# Patient Record
Sex: Male | Born: 1940 | Race: White | Hispanic: No | Smoking: Current every day smoker
Health system: Southern US, Community
[De-identification: ages and names within clinical notes are randomized; demographics above are authoritative.]

## PROBLEM LIST (undated history)

## (undated) DIAGNOSIS — R011 Cardiac murmur, unspecified: Secondary | ICD-10-CM

## (undated) DIAGNOSIS — I10 Essential (primary) hypertension: Secondary | ICD-10-CM

## (undated) DIAGNOSIS — C3490 Malignant neoplasm of unspecified part of unspecified bronchus or lung: Secondary | ICD-10-CM

## (undated) DIAGNOSIS — T7840XA Allergy, unspecified, initial encounter: Secondary | ICD-10-CM

## (undated) DIAGNOSIS — K3533 Acute appendicitis with perforation and localized peritonitis, with abscess: Principal | ICD-10-CM

## (undated) DIAGNOSIS — R972 Elevated prostate specific antigen [PSA]: Secondary | ICD-10-CM

## (undated) DIAGNOSIS — I4891 Unspecified atrial fibrillation: Secondary | ICD-10-CM

## (undated) DIAGNOSIS — F17200 Nicotine dependence, unspecified, uncomplicated: Secondary | ICD-10-CM

## (undated) DIAGNOSIS — K567 Ileus, unspecified: Secondary | ICD-10-CM

## (undated) DIAGNOSIS — K9189 Other postprocedural complications and disorders of digestive system: Secondary | ICD-10-CM

## (undated) DIAGNOSIS — M1990 Unspecified osteoarthritis, unspecified site: Secondary | ICD-10-CM

## (undated) DIAGNOSIS — F419 Anxiety disorder, unspecified: Secondary | ICD-10-CM

## (undated) DIAGNOSIS — R7881 Bacteremia: Secondary | ICD-10-CM

## (undated) DIAGNOSIS — R0602 Shortness of breath: Secondary | ICD-10-CM

## (undated) HISTORY — DX: Essential (primary) hypertension: I10

## (undated) HISTORY — PX: APPENDECTOMY: SHX54

## (undated) HISTORY — PX: KNEE SURGERY: SHX244

## (undated) HISTORY — DX: Anxiety disorder, unspecified: F41.9

## (undated) HISTORY — DX: Bacteremia: R78.81

## (undated) HISTORY — PX: CARPAL TUNNEL RELEASE: SHX101

## (undated) HISTORY — DX: Cardiac murmur, unspecified: R01.1

## (undated) HISTORY — PX: POLYPECTOMY: SHX149

## (undated) HISTORY — PX: BACK SURGERY: SHX140

## (undated) HISTORY — PX: COLONOSCOPY: SHX174

## (undated) HISTORY — PX: EYE SURGERY: SHX253

## (undated) HISTORY — DX: Allergy, unspecified, initial encounter: T78.40XA

## (undated) HISTORY — DX: Elevated prostate specific antigen (PSA): R97.20

---

## 2000-09-16 ENCOUNTER — Encounter: Payer: Self-pay | Admitting: Orthopedic Surgery

## 2000-09-16 ENCOUNTER — Ambulatory Visit (HOSPITAL_COMMUNITY): Admission: RE | Admit: 2000-09-16 | Discharge: 2000-09-16 | Payer: Self-pay | Admitting: Orthopedic Surgery

## 2001-05-14 ENCOUNTER — Encounter: Payer: Self-pay | Admitting: Orthopedic Surgery

## 2001-05-14 ENCOUNTER — Encounter: Admission: RE | Admit: 2001-05-14 | Discharge: 2001-05-14 | Payer: Self-pay | Admitting: Orthopedic Surgery

## 2001-08-20 ENCOUNTER — Encounter (INDEPENDENT_AMBULATORY_CARE_PROVIDER_SITE_OTHER): Payer: Self-pay | Admitting: Specialist

## 2001-08-20 ENCOUNTER — Encounter: Payer: Self-pay | Admitting: Orthopedic Surgery

## 2001-08-20 ENCOUNTER — Observation Stay (HOSPITAL_COMMUNITY): Admission: RE | Admit: 2001-08-20 | Discharge: 2001-08-21 | Payer: Self-pay | Admitting: Orthopedic Surgery

## 2002-07-10 ENCOUNTER — Encounter: Payer: Self-pay | Admitting: Orthopedic Surgery

## 2002-07-10 ENCOUNTER — Encounter: Admission: RE | Admit: 2002-07-10 | Discharge: 2002-07-10 | Payer: Self-pay | Admitting: Orthopedic Surgery

## 2002-07-31 ENCOUNTER — Observation Stay (HOSPITAL_COMMUNITY): Admission: RE | Admit: 2002-07-31 | Discharge: 2002-08-01 | Payer: Self-pay | Admitting: Orthopedic Surgery

## 2002-07-31 ENCOUNTER — Encounter (INDEPENDENT_AMBULATORY_CARE_PROVIDER_SITE_OTHER): Payer: Self-pay | Admitting: Specialist

## 2002-07-31 ENCOUNTER — Encounter: Payer: Self-pay | Admitting: Orthopedic Surgery

## 2003-07-02 ENCOUNTER — Encounter: Admission: RE | Admit: 2003-07-02 | Discharge: 2003-07-02 | Payer: Self-pay | Admitting: Internal Medicine

## 2004-06-05 ENCOUNTER — Ambulatory Visit: Payer: Self-pay | Admitting: Internal Medicine

## 2004-08-29 ENCOUNTER — Ambulatory Visit: Payer: Self-pay | Admitting: Internal Medicine

## 2004-09-08 ENCOUNTER — Ambulatory Visit: Payer: Self-pay | Admitting: Internal Medicine

## 2004-09-25 ENCOUNTER — Ambulatory Visit: Payer: Self-pay | Admitting: Internal Medicine

## 2005-08-09 ENCOUNTER — Ambulatory Visit: Payer: Self-pay | Admitting: Internal Medicine

## 2005-08-23 ENCOUNTER — Ambulatory Visit: Payer: Self-pay | Admitting: *Deleted

## 2005-08-27 ENCOUNTER — Ambulatory Visit: Payer: Self-pay | Admitting: Emergency Medicine

## 2006-03-11 ENCOUNTER — Ambulatory Visit (HOSPITAL_BASED_OUTPATIENT_CLINIC_OR_DEPARTMENT_OTHER): Admission: RE | Admit: 2006-03-11 | Discharge: 2006-03-11 | Payer: Self-pay | Admitting: Orthopedic Surgery

## 2006-10-22 ENCOUNTER — Ambulatory Visit: Payer: Self-pay | Admitting: Internal Medicine

## 2007-03-18 ENCOUNTER — Telehealth (INDEPENDENT_AMBULATORY_CARE_PROVIDER_SITE_OTHER): Payer: Self-pay | Admitting: *Deleted

## 2007-11-06 ENCOUNTER — Telehealth (INDEPENDENT_AMBULATORY_CARE_PROVIDER_SITE_OTHER): Payer: Self-pay | Admitting: *Deleted

## 2007-11-10 ENCOUNTER — Ambulatory Visit: Payer: Self-pay | Admitting: Internal Medicine

## 2007-11-10 DIAGNOSIS — L509 Urticaria, unspecified: Secondary | ICD-10-CM | POA: Insufficient documentation

## 2007-11-10 DIAGNOSIS — Z87898 Personal history of other specified conditions: Secondary | ICD-10-CM | POA: Insufficient documentation

## 2008-01-29 ENCOUNTER — Ambulatory Visit: Payer: Self-pay | Admitting: Internal Medicine

## 2008-01-29 DIAGNOSIS — H9209 Otalgia, unspecified ear: Secondary | ICD-10-CM | POA: Insufficient documentation

## 2008-01-29 DIAGNOSIS — H6120 Impacted cerumen, unspecified ear: Secondary | ICD-10-CM | POA: Insufficient documentation

## 2008-02-02 ENCOUNTER — Telehealth (INDEPENDENT_AMBULATORY_CARE_PROVIDER_SITE_OTHER): Payer: Self-pay | Admitting: *Deleted

## 2008-02-04 ENCOUNTER — Encounter: Payer: Self-pay | Admitting: Internal Medicine

## 2010-12-01 NOTE — Op Note (Signed)
NAMETRENDEN, HAZELRIGG                         ACCOUNT NO.:  [id number]   MEDICAL RECORD NO.:  [id number]                   PATIENT TYPE:  AMB   LOCATION:  DAY                                  FACILITY:  [facility]   PHYSICIAN:  Marlowe Kays, M.D.               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   SURGEON:  Marlowe Kays, M.D.   ASSISTANT:  Sharolyn Douglas, M.D.   ANESTHESIA:  General.   PATHOLOGY AND JUSTIFICATION FOR PROCEDURE:  He had had a microdiskectomy and  lateral recess decompression roughly a year ago by me on the left at L4-5  and had done well until several months ago, when he developed severe pain in  the left buttock and left leg going down to the lateral calf.  Workup has  included a gadolinium-enhanced MRI, which has demonstrated what appeared to  be foraminal stenosis and possible disk herniation at L5-S1 left as well as  associated with osteophytes and perhaps a similar-type picture on the left  at L4-5 without any recurrent disk herniation.  Myelogram and CT scan have  indicated poor filling of the S1 nerve root on the left and lateral recess  defects at both L4-5 and L5-S1 with a possible free fragment superior to the  L5-S1 disk, which is felt to probably emanate from the L5-S1 disk.  Accordingly, planned today was to do a microdiskectomy and decompression of  the S1 nerve root and probably decompress laterally as well up to L4-5,  decompressing the L5 nerve root and looking for the free fragment.   DESCRIPTION OF PROCEDURE:  Prophylactic antibiotics.  Satisfactory general  anesthesia, in knee-chest position on the Sharon frame.  The back was  prepped with Duraprep and with two spinal needles and a lateral x-ray  tentatively localized the L5-S1 interspace, which was just distal to the  previous incision.  Then continued draping the back in a sterile field.  Made my incision based on the initial x-rays and locating anatomically the  sacrum and the untouched L5-S1 interspace.  Soft tissue was dissected off  the lamina of L5 and the sacrum.  Self-retaining retractor placed.  We then  removed a portion of the inferior lamina of L5 with a double-action rongeur  and then behind the sacrum with a small curette and then began using two 3  mm Kerrison rongeurs to remove bone and decompress the foramen.  There  was a  good bit of bony compression as pictured on the MRI and on the myelogram.  We then brought in the microscope and completed the foraminal and lateral  recess decompression.  The L5-S1 disk was located and found to be somewhat  bulging with a discoloration of the annulus.  We opened this with a 15 knife  blade, but the disk space was narrow and we really could not get a lot of  disk material out.  This confirmed that we felt that there was additional  pathology as we had planned, and we decided to begin looking cephalad and  removing almost all of the lamina of L5 with a combination of double action  and Kerrison rongeurs, in the process did a lateral recess decompression and  unroofed the L5 nerve root.  It was being tightly compressed laterally past  the foramen.  We also found a good bit of material which may have been disk  material or ligamentum flavum.  We were unable to tell for certain between  the L5 and S1 nerve roots.  All this was cleaned up with pituitary and  Kerrison rongeur.  At conclusion of the  case, both the L5 and S1 nerve roots  were lying nicely free, and there was no lateral recess compression.  The  wound was then irrigated with sterile saline.  It was dry on closure.  Gelfoam was placed over the dura.  The self-retaining retractors were  removed, and once again there was no unusual bleeding.  We closed the fascia  with interrupted #1 Vicryl, the subcutaneous tissue with 2-0 Vicryl after  infiltrating it with 0.5% plain Marcaine.  He was also given 30 mg of  Toradol IV.  The skin was closed with staples.  Betadine, Adaptic, dry  sterile dressing were applied.  He tolerated the procedure well and was  taken to the recovery room in satisfactory condition with no known  complications.                                               Marlowe Kays, M.D.    JA/MEDQ  D:  07/31/2002  T:  08/01/2002  Job:  [id number]

## 2010-12-01 NOTE — Op Note (Signed)
NAMETONY, GRANQUIST               ACCOUNT NO.:  [id number]   MEDICAL RECORD NO.:  [id number]          PATIENT TYPE:  AMB   LOCATION:  [facility]                         FACILITY:  [facility]   PHYSICIAN:  Marlowe Kays, M.D.  DATE OF BIRTH:  [birth year]   DATE OF PROCEDURE:  03/11/2006  DATE OF DISCHARGE:                                 OPERATIVE REPORT   PREOPERATIVE DIAGNOSES:  1. Torn medial meniscus.  2. Osteoarthritis right knee.   POSTOPERATIVE DIAGNOSES:  1. Torn medial meniscus.  2. Osteoarthritis right knee.  3. Torn lateral meniscus.   OPERATION:  Right knee arthroscopy with partial medial and lateral  meniscectomies and joint debridement.   SURGEON:  Marlowe Kays, M.D.   ASSISTANT:  Nurse.   ANESTHESIA:  General.   PATHOLOGY AND JUSTIFICATION FOR PROCEDURE:  He has had right knee problems  dating since high school. Had knee surgery at that time, had done reasonably  well until about 8 weeks ago when he developed pain in his knee with an MRI  demonstrating ACL deficient knee, osteoarthritis, some loose bodies and a  badly torn residual medial meniscus.  He understands that he may eventually  require a knee replacement but this time felt that a more moderate approach  was indicated and could be supplemented with viscous supplementation and  results of today's surgery.  He understands all this.   PROCEDURE:  Satisfactory general anesthesia, pneumatic tourniquet.  Leg was  Esmarched out nonsterilely and tourniquet inflated to 3 mmHg.  Thigh  stabilizer with right leg prepped with DuraPrep from stabilizer to ankle  draped in sterile field.  Knee support for left knee.  Superior medial  inflow portal, first through an anterolateral portal, medial compartment  knee joint was evaluated.  The pathology noted on the MRI was confirmed.  He  did have some disruption of the ACL with stranding in the joint which I  trimmed up with combination of baskets and a 3.5 shaver.  He had a  badly  torn posterior third of the medial meniscus which I trimmed back with  baskets and shaved down until smooth with 3.5 shaver.  He had some wear of  the medial femoral condyle which I gently smoothed down.  Most of the medial  femoral condyle was fairly eburnated.  He had full-thickness defects of his  medial tibial plateau which did not require shaving.  There were some  fragments of articular cartilage that I evacuated and these may have been  coming from the medial tibial plateau.  Looking at the medial gutter and  suprapatellar area, there is some wear of the patella but nothing that  needed surgical correction.  I then reversed portals.  His lateral meniscus  had a good bit of significant fraying throughout the entire extent which I  pictured then debrided down with a 3.5 shaver. In the process, I also  debrided portion of his ACL.  The joint was then irrigated until clear and  all fluid possible removed.  The two anterior portals closed 4-0 nylon.  I  then injected through the  inflow apparatus  20 mL 0.5% Marcaine with Adrenalin, 4 mg of morphine with  the inflow apparatus closed this portal closed with 4-0 nylon as well.  Betadine Adaptic dry sterile dressing were applied.  Tourniquet was  released, he tolerated the procedure well was taken to recovery satisfactory  condition with no known complications.           ______________________________  Marlowe Kays, M.D.     JA/MEDQ  D:  03/11/2006  T:  03/12/2006  Job:  [id number]

## 2010-12-01 NOTE — Op Note (Signed)
[facility]  Patient:    [name], [name] Visit Number: [id number] MRN: [id number]          Service Type: SUR Location: 4W [id number] [id number] Attending Physician:  Marlowe Kays Page Dictated by:   Illene Labrador. Aplington, M.D. Proc. 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  OPERATION PERFORMED:  Decompressive hemilaminectomy L4-5 with microdiskectomy.  SURGEON:  Illene Labrador. Aplington, M.D.  ASSISTANT:  Georges Lynch. Darrelyn Hillock, M.D.  ANESTHESIA:  General.  PATHOLOGY AND JUSTIFICATION FOR PROCEDURE:  He has had a long history of back problems and has had a previous microdiskectomy L3-4 on the right. He is having strictly left leg symptoms at this time with the myelogram and CT scan demonstrating a lateral defect at L4-5 with poor filling of the L5 nerve root and on the CT scan disk herniation. He has had temporary relief only with epidural steroid injection L4-5 on the left. He consequently is here today for the above mentioned surgery.  DESCRIPTION OF PROCEDURE:  Prophylactic antibiotics, satisfactory general anesthesia, knee chest position on the Andrews frame, back was prepped with Duraprep, three spinal needles on lateral x-ray, we located the L4-5 interspace. This did correspond to the previous surgical incision just slightly above this area. We then continued draping the back into a sterile field, Ioban employed, a vertical midline incision. The two spinous processes at this level were tied with Kocher clamps and a second lateral x-ray taken confirming that the clamps were on the spinous processes of L4 and L5 with the disk space located midway between. I then continued dissecting the soft tissue off the lamina of L4 and L5 and placed  a self retaining McCullough retractor. After removing some superficial fibrous tissue from the interspace, I then was able to undermine the superior portion of the lamina of L5 with a small curette and began first with a 2 mm Kerrison rongeur and then working with threes and fours removed a good bit of bone and ligamentum flavum. He had a good bit of lateral recess stenosis and when we had sufficient working room, I brought in the microscope and then we began removing additional bone and ligamentum flavum laterally. It became clear, however, that we needed additional bone resection laterally which we could not get with the [facility] and then used 1/2 inch curved osteotome to remove most of the superficial bone laterally and then completed the lateral decompression with 2 and 3 mm Kerrison rongeurs until we were able to get well lateral to the L5 nerve root which we thoroughly decompressed on the foramen. We were then able to gently retract the nerve root medially with the DErrico retractor. The disk herniation was readily visible. There were several veins lying adjacent and on top of it which I cauterized with bipolar cautery. The posterior longitudinal ligament was opened with a 15 knife blade and a large amount of disk material removed both laterally and centrally. We kept working with Epstein curette and straight and angled upbite pituitaries and all disk material obtained was removed from the interspace. We then checked with the hockey stick to be sure that there were no residual fragments beneath the dura and also  checked the foramina for the L5 nerve root which was now well decompressed and the L4 nerve root were both widely patent. We then irrigated the wound well with sterile saline and placed Gelfoam over the interspace and over the dura. Self retaining retractors were removed. Some minimal superficial bleeders were coagulated and closure was then performed with interrupted #1  Vicryl in the fascia, 2-0 Vicryl in the subcutaneous tissue which was also infiltrated with 0.5% plain Marcaine as well as given 30 mg of Toradol IV. The skin was closed with small staples. Betadine Adaptic dry sterile dressing were applied. He tolerated the procedure well and was taken to the recovery room in satisfactory condition with no known complications. Dictated by:   Illene Labrador. Aplington, M.D. Attending Physician:  Joaquin Courts DD:  08/20/01 TD:  08/21/01 Job: [id number]

## 2011-04-26 ENCOUNTER — Inpatient Hospital Stay (INDEPENDENT_AMBULATORY_CARE_PROVIDER_SITE_OTHER)
Admission: RE | Admit: 2011-04-26 | Discharge: 2011-04-26 | Disposition: A | Discharge status: Home / Self Care | Payer: Medicare Other | Source: Ambulatory Visit | Attending: Emergency Medicine | Admitting: Emergency Medicine

## 2011-04-26 DIAGNOSIS — L02419 Cutaneous abscess of limb, unspecified: Secondary | ICD-10-CM

## 2011-04-26 DIAGNOSIS — I776 Arteritis, unspecified: Secondary | ICD-10-CM

## 2011-04-26 DIAGNOSIS — L03119 Cellulitis of unspecified part of limb: Secondary | ICD-10-CM

## 2012-07-16 HISTORY — PX: SHOULDER SURGERY: SHX246

## 2013-01-25 ENCOUNTER — Encounter (HOSPITAL_COMMUNITY): Admission: EM | Disposition: A | Discharge status: Home / Self Care | Payer: Self-pay | Source: Home / Self Care

## 2013-01-25 ENCOUNTER — Other Ambulatory Visit (HOSPITAL_COMMUNITY): Payer: Medicare Other

## 2013-01-25 ENCOUNTER — Encounter (HOSPITAL_COMMUNITY): Payer: Self-pay | Admitting: *Deleted

## 2013-01-25 ENCOUNTER — Emergency Department (HOSPITAL_COMMUNITY): Payer: Medicare Other

## 2013-01-25 ENCOUNTER — Encounter (HOSPITAL_COMMUNITY): Payer: Self-pay | Admitting: Anesthesiology

## 2013-01-25 ENCOUNTER — Inpatient Hospital Stay (HOSPITAL_COMMUNITY): Payer: Medicare Other | Admitting: Anesthesiology

## 2013-01-25 ENCOUNTER — Inpatient Hospital Stay (HOSPITAL_COMMUNITY)
Admission: EM | Admit: 2013-01-25 | Discharge: 2013-01-30 | DRG: 339 | Disposition: A | Discharge status: Home / Self Care | Payer: Medicare Other | Attending: General Surgery | Admitting: General Surgery

## 2013-01-25 DIAGNOSIS — Z96659 Presence of unspecified artificial knee joint: Secondary | ICD-10-CM

## 2013-01-25 DIAGNOSIS — K37 Unspecified appendicitis: Secondary | ICD-10-CM

## 2013-01-25 DIAGNOSIS — Y9210 Unspecified residential institution as the place of occurrence of the external cause: Secondary | ICD-10-CM | POA: Diagnosis present

## 2013-01-25 DIAGNOSIS — K3533 Acute appendicitis with perforation and localized peritonitis, with abscess: Principal | ICD-10-CM | POA: Diagnosis present

## 2013-01-25 DIAGNOSIS — K560 Paralytic ileus: Secondary | ICD-10-CM | POA: Diagnosis not present

## 2013-01-25 DIAGNOSIS — K929 Disease of digestive system, unspecified: Secondary | ICD-10-CM | POA: Diagnosis not present

## 2013-01-25 DIAGNOSIS — F17200 Nicotine dependence, unspecified, uncomplicated: Secondary | ICD-10-CM | POA: Diagnosis present

## 2013-01-25 DIAGNOSIS — K9189 Other postprocedural complications and disorders of digestive system: Secondary | ICD-10-CM | POA: Diagnosis not present

## 2013-01-25 DIAGNOSIS — K352 Acute appendicitis with generalized peritonitis, without abscess: Secondary | ICD-10-CM

## 2013-01-25 DIAGNOSIS — K567 Ileus, unspecified: Secondary | ICD-10-CM | POA: Diagnosis not present

## 2013-01-25 DIAGNOSIS — Z7982 Long term (current) use of aspirin: Secondary | ICD-10-CM

## 2013-01-25 DIAGNOSIS — Z6831 Body mass index (BMI) 31.0-31.9, adult: Secondary | ICD-10-CM

## 2013-01-25 DIAGNOSIS — Y836 Removal of other organ (partial) (total) as the cause of abnormal reaction of the patient, or of later complication, without mention of misadventure at the time of the procedure: Secondary | ICD-10-CM | POA: Diagnosis present

## 2013-01-25 DIAGNOSIS — Z79899 Other long term (current) drug therapy: Secondary | ICD-10-CM

## 2013-01-25 HISTORY — DX: Morbid (severe) obesity due to excess calories: E66.01

## 2013-01-25 HISTORY — DX: Ileus, unspecified: K56.7

## 2013-01-25 HISTORY — DX: Acute appendicitis with perforation and localized peritonitis, with abscess: K35.33

## 2013-01-25 HISTORY — DX: Nicotine dependence, unspecified, uncomplicated: F17.200

## 2013-01-25 HISTORY — PX: LAPAROSCOPIC APPENDECTOMY: SHX408

## 2013-01-25 HISTORY — DX: Other postprocedural complications and disorders of digestive system: K91.89

## 2013-01-25 LAB — URINALYSIS, ROUTINE W REFLEX MICROSCOPIC
Hgb urine dipstick: NEGATIVE
Specific Gravity, Urine: 1.022 (ref 1.005–1.030)
Urobilinogen, UA: 0.2 mg/dL (ref 0.0–1.0)

## 2013-01-25 LAB — COMPREHENSIVE METABOLIC PANEL
AST: 14 U/L (ref 0–37)
Albumin: 4 g/dL (ref 3.5–5.2)
CO2: 22 mEq/L (ref 19–32)
Calcium: 9.4 mg/dL (ref 8.4–10.5)
Creatinine, Ser: 0.72 mg/dL (ref 0.50–1.35)
GFR calc non Af Amer: >90 mL/min (ref >90)
Total Protein: 7.1 g/dL (ref 6.0–8.3)

## 2013-01-25 LAB — CBC WITH DIFFERENTIAL/PLATELET
Basophils Absolute: 0 10*3/uL (ref 0.0–0.1)
Basophils Relative: 0 % (ref 0–1)
Eosinophils Absolute: 0 10*3/uL (ref 0.0–0.7)
Eosinophils Relative: 0 % (ref 0–5)
HCT: 52.8 % — ABNORMAL HIGH (ref 39.0–52.0)
Hemoglobin: 19.1 g/dL — ABNORMAL HIGH (ref 13.0–17.0)
Lymphs Abs: 1 10*3/uL (ref 0.7–4.0)
MCV: 94.1 fL (ref 78.0–100.0)
Monocytes Absolute: 1 10*3/uL (ref 0.1–1.0)
Neutrophils Relative %: 86 % — ABNORMAL HIGH (ref 43–77)
RBC: 5.61 MIL/uL (ref 4.22–5.81)
RDW: 14.9 % (ref 11.5–15.5)
WBC: 14.6 10*3/uL — ABNORMAL HIGH (ref 4.0–10.5)

## 2013-01-25 LAB — LIPASE, BLOOD: Lipase: 12 U/L (ref 11–59)

## 2013-01-25 SURGERY — APPENDECTOMY, LAPAROSCOPIC
Anesthesia: General | Site: Abdomen | Wound class: Dirty or Infected

## 2013-01-25 MED ORDER — BUPIVACAINE HCL 0.25 % IJ SOLN
INTRAMUSCULAR | Status: DC | PRN
  Administered 2013-01-25: 4 mL

## 2013-01-25 MED ORDER — DOCUSATE SODIUM 100 MG PO CAPS
100.0000 mg | ORAL_CAPSULE | Freq: Two times a day (BID) | ORAL | Status: DC
  Administered 2013-01-25 – 2013-01-30 (×10): 100 mg via ORAL
  Filled 2013-01-25 (×11): qty 1

## 2013-01-25 MED ORDER — SODIUM CHLORIDE 0.9 % IV SOLN
1.0000 g | INTRAVENOUS | Status: DC
  Administered 2013-01-26 – 2013-01-29 (×4): 1 g via INTRAVENOUS
  Filled 2013-01-25 (×6): qty 1

## 2013-01-25 MED ORDER — SUCCINYLCHOLINE CHLORIDE 20 MG/ML IJ SOLN
INTRAMUSCULAR | Status: DC | PRN
  Administered 2013-01-25: 120 mg via INTRAVENOUS

## 2013-01-25 MED ORDER — ONDANSETRON 4 MG PO TBDP
8.0000 mg | ORAL_TABLET | Freq: Once | ORAL | Status: AC
  Administered 2013-01-25: 8 mg via ORAL
  Filled 2013-01-25: qty 2

## 2013-01-25 MED ORDER — SODIUM CHLORIDE 0.9 % IV SOLN: 1.0000 g | Freq: Once | INTRAVENOUS | Status: DC

## 2013-01-25 MED ORDER — IOHEXOL 300 MG/ML  SOLN
25.0000 mL | INTRAMUSCULAR | Status: DC | PRN
  Administered 2013-01-25: 25 mL via ORAL

## 2013-01-25 MED ORDER — FENTANYL CITRATE 0.05 MG/ML IJ SOLN
INTRAMUSCULAR | Status: DC | PRN
  Administered 2013-01-25: 50 ug via INTRAVENOUS
  Administered 2013-01-25: 100 ug via INTRAVENOUS

## 2013-01-25 MED ORDER — VECURONIUM BROMIDE 10 MG IV SOLR
INTRAVENOUS | Status: DC | PRN
  Administered 2013-01-25: 3 mg via INTRAVENOUS

## 2013-01-25 MED ORDER — DEXTROSE-NACL 5-0.9 % IV SOLN
INTRAVENOUS | Status: DC
  Administered 2013-01-25 – 2013-01-30 (×7): via INTRAVENOUS

## 2013-01-25 MED ORDER — PROPOFOL 10 MG/ML IV BOLUS
INTRAVENOUS | Status: DC | PRN
  Administered 2013-01-25: 30 mg via INTRAVENOUS
  Administered 2013-01-25: 100 mg via INTRAVENOUS
  Administered 2013-01-25: 20 mg via INTRAVENOUS
  Administered 2013-01-25: 30 mg via INTRAVENOUS
  Administered 2013-01-25: 20 mg via INTRAVENOUS

## 2013-01-25 MED ORDER — LACTATED RINGERS IV SOLN
INTRAVENOUS | Status: DC | PRN
  Administered 2013-01-25: 15:00:00 via INTRAVENOUS

## 2013-01-25 MED ORDER — HYDROMORPHONE HCL PF 1 MG/ML IJ SOLN: 1.0000 mg | INTRAMUSCULAR | Status: DC | PRN

## 2013-01-25 MED ORDER — ONDANSETRON HCL 4 MG/2ML IJ SOLN
4.0000 mg | Freq: Four times a day (QID) | INTRAMUSCULAR | Status: DC | PRN
  Administered 2013-01-26 – 2013-01-27 (×2): 4 mg via INTRAVENOUS

## 2013-01-25 MED ORDER — IOHEXOL 300 MG/ML  SOLN
120.0000 mL | Freq: Once | INTRAMUSCULAR | Status: AC | PRN
  Administered 2013-01-25: 120 mL via INTRAVENOUS

## 2013-01-25 MED ORDER — MEPERIDINE HCL 25 MG/ML IJ SOLN: 6.2500 mg | INTRAMUSCULAR | Status: DC | PRN

## 2013-01-25 MED ORDER — HYDROCODONE-ACETAMINOPHEN 5-325 MG PO TABS
1.0000 | ORAL_TABLET | ORAL | Status: DC | PRN
  Administered 2013-01-25: 2 via ORAL
  Administered 2013-01-25: 1 via ORAL
  Administered 2013-01-26 – 2013-01-28 (×7): 2 via ORAL
  Filled 2013-01-25 (×8): qty 2

## 2013-01-25 MED ORDER — LIDOCAINE HCL (CARDIAC) 20 MG/ML IV SOLN
INTRAVENOUS | Status: DC | PRN
  Administered 2013-01-25: 100 mg via INTRAVENOUS

## 2013-01-25 MED ORDER — ONDANSETRON HCL 4 MG PO TABS
4.0000 mg | ORAL_TABLET | Freq: Four times a day (QID) | ORAL | Status: DC | PRN
  Administered 2013-01-27 – 2013-01-28 (×3): 4 mg via ORAL
  Filled 2013-01-25 (×2): qty 1

## 2013-01-25 MED ORDER — HYDROCODONE-ACETAMINOPHEN 5-325 MG PO TABS
ORAL_TABLET | ORAL | Status: AC
  Filled 2013-01-25: qty 2

## 2013-01-25 MED ORDER — HYDROMORPHONE HCL PF 1 MG/ML IJ SOLN
0.2500 mg | INTRAMUSCULAR | Status: DC | PRN
  Administered 2013-01-25 (×2): 0.5 mg via INTRAVENOUS

## 2013-01-25 MED ORDER — SODIUM CHLORIDE 0.9 % IV SOLN
INTRAVENOUS | Status: DC | PRN
  Administered 2013-01-25: 14:00:00 via INTRAVENOUS

## 2013-01-25 MED ORDER — OXYCODONE HCL 5 MG PO TABS: 5.0000 mg | ORAL_TABLET | Freq: Once | ORAL | Status: DC | PRN

## 2013-01-25 MED ORDER — ONDANSETRON HCL 4 MG/2ML IJ SOLN
4.0000 mg | Freq: Four times a day (QID) | INTRAMUSCULAR | Status: DC | PRN
  Administered 2013-01-27: 4 mg via INTRAVENOUS
  Filled 2013-01-25 (×3): qty 2

## 2013-01-25 MED ORDER — SODIUM CHLORIDE 0.9 % IV BOLUS (SEPSIS)
1000.0000 mL | Freq: Once | INTRAVENOUS | Status: AC
  Administered 2013-01-25: 1000 mL via INTRAVENOUS

## 2013-01-25 MED ORDER — SODIUM CHLORIDE 0.9 % IV SOLN: 3.0000 g | Freq: Once | INTRAVENOUS | Status: DC

## 2013-01-25 MED ORDER — SODIUM CHLORIDE 0.9 % IV SOLN
1.0000 g | Freq: Once | INTRAVENOUS | Status: AC
  Administered 2013-01-25: 1 g via INTRAVENOUS
  Filled 2013-01-25 (×2): qty 1

## 2013-01-25 MED ORDER — SODIUM CHLORIDE 0.9 % IR SOLN
Status: DC | PRN
  Administered 2013-01-25: 1

## 2013-01-25 MED ORDER — OXYCODONE HCL 5 MG/5ML PO SOLN: 5.0000 mg | Freq: Once | ORAL | Status: DC | PRN

## 2013-01-25 MED ORDER — ONDANSETRON HCL 4 MG/2ML IJ SOLN: 4.0000 mg | Freq: Once | INTRAMUSCULAR | Status: DC | PRN

## 2013-01-25 MED ORDER — SODIUM CHLORIDE 0.9 % IR SOLN
Status: DC | PRN
  Administered 2013-01-25: 1000 mL

## 2013-01-25 MED ORDER — HYDROMORPHONE HCL PF 1 MG/ML IJ SOLN
INTRAMUSCULAR | Status: AC
  Filled 2013-01-25: qty 1

## 2013-01-25 MED ORDER — BUPIVACAINE HCL (PF) 0.25 % IJ SOLN
INTRAMUSCULAR | Status: AC
  Filled 2013-01-25: qty 30

## 2013-01-25 SURGICAL SUPPLY — 49 items
ADH SKN CLS LQ APL DERMABOND (GAUZE/BANDAGES/DRESSINGS) ×1
APL SKNCLS STERI-STRIP NONHPOA (GAUZE/BANDAGES/DRESSINGS) ×1
APPLIER CLIP 5 13 M/L LIGAMAX5 (MISCELLANEOUS)
APR CLP MED LRG 5 ANG JAW (MISCELLANEOUS)
BENZOIN TINCTURE PRP APPL 2/3 (GAUZE/BANDAGES/DRESSINGS) ×2 IMPLANT
BLADE SURG ROTATE 9660 (MISCELLANEOUS) ×2 IMPLANT
CANISTER SUCTION 2500CC (MISCELLANEOUS) ×2 IMPLANT
CHLORAPREP W/TINT 26ML (MISCELLANEOUS) ×2 IMPLANT
CLIP APPLIE 5 13 M/L LIGAMAX5 (MISCELLANEOUS) IMPLANT
CLOTH BEACON ORANGE TIMEOUT ST (SAFETY) ×2 IMPLANT
COVER SURGICAL LIGHT HANDLE (MISCELLANEOUS) ×2 IMPLANT
COVER TRANSDUCER ULTRASND (DRAPES) ×2 IMPLANT
DECANTER SPIKE VIAL GLASS SM (MISCELLANEOUS) ×1 IMPLANT
DERMABOND ADHESIVE PROPEN (GAUZE/BANDAGES/DRESSINGS) ×1
DERMABOND ADVANCED .7 DNX6 (GAUZE/BANDAGES/DRESSINGS) IMPLANT
DEVICE TROCAR PUNCTURE CLOSURE (ENDOMECHANICALS) IMPLANT
DRAIN CHANNEL 19F RND (DRAIN) ×1 IMPLANT
DRAPE UTILITY 15X26 W/TAPE STR (DRAPE) ×6 IMPLANT
ELECT REM PT RETURN 9FT ADLT (ELECTROSURGICAL) ×2
ELECTRODE REM PT RTRN 9FT ADLT (ELECTROSURGICAL) ×1 IMPLANT
ENDOLOOP SUT PDS II  0 18 (SUTURE) ×3
ENDOLOOP SUT PDS II 0 18 (SUTURE) ×3 IMPLANT
EVACUATOR SILICONE 100CC (DRAIN) ×1 IMPLANT
GLOVE BIO SURGEON STRL SZ7.5 (GLOVE) ×2 IMPLANT
GOWN STRL NON-REIN LRG LVL3 (GOWN DISPOSABLE) ×4 IMPLANT
GOWN STRL REIN XL XLG (GOWN DISPOSABLE) ×2 IMPLANT
KIT BASIN OR (CUSTOM PROCEDURE TRAY) ×2 IMPLANT
KIT ROOM TURNOVER OR (KITS) ×2 IMPLANT
NDL INSUFFLATION 14GA 120MM (NEEDLE) ×1 IMPLANT
NEEDLE INSUFFLATION 14GA 120MM (NEEDLE) ×2 IMPLANT
NS IRRIG 1000ML POUR BTL (IV SOLUTION) ×2 IMPLANT
PAD ARMBOARD 7.5X6 YLW CONV (MISCELLANEOUS) ×4 IMPLANT
SCISSORS LAP 5X35 DISP (ENDOMECHANICALS) ×2 IMPLANT
SET IRRIG TUBING LAPAROSCOPIC (IRRIGATION / IRRIGATOR) ×2 IMPLANT
SLEEVE ENDOPATH XCEL 5M (ENDOMECHANICALS) ×2 IMPLANT
SPECIMEN JAR SMALL (MISCELLANEOUS) ×2 IMPLANT
SUT ETHILON 3 0 PS 1 (SUTURE) ×1 IMPLANT
SUT MNCRL AB 3-0 PS2 18 (SUTURE) ×4 IMPLANT
SUT SILK 3 0 SH 30 (SUTURE) ×2 IMPLANT
SUT VIC AB 1 BRD 54 (SUTURE) ×2 IMPLANT
SUT VIC AB 1 CT1 27 (SUTURE) ×2
SUT VIC AB 1 CT1 27XBRD ANBCTR (SUTURE) IMPLANT
TOWEL OR 17X24 6PK STRL BLUE (TOWEL DISPOSABLE) ×2 IMPLANT
TOWEL OR 17X26 10 PK STRL BLUE (TOWEL DISPOSABLE) ×2 IMPLANT
TRAY FOLEY CATH 14FRSI W/METER (CATHETERS) ×1 IMPLANT
TRAY FOLEY CATH 16FRSI W/METER (SET/KITS/TRAYS/PACK) ×1 IMPLANT
TRAY LAPAROSCOPIC (CUSTOM PROCEDURE TRAY) ×2 IMPLANT
TROCAR XCEL NON-BLD 11X100MML (ENDOMECHANICALS) ×2 IMPLANT
TROCAR XCEL NON-BLD 5MMX100MML (ENDOMECHANICALS) ×2 IMPLANT

## 2013-01-25 NOTE — ED Notes (Signed)
OR called and states ready for pt

## 2013-01-25 NOTE — Anesthesia Postprocedure Evaluation (Signed)
Anesthesia Post Note  Patient: [name]  Procedure(s) Performed: Procedure(s) (LRB): APPENDECTOMY LAPAROSCOPIC (N/A)  Anesthesia type: general  Patient location: PACU  Post pain: Pain level controlled  Post assessment: Patient's Cardiovascular Status Stable  Last Vitals:  Filed Vitals:   01/25/13 1745  BP: 135/55  Pulse: 95  Temp: 37.6 C  Resp: 16    Post vital signs: Reviewed and stable  Level of consciousness: sedated  Complications: No apparent anesthesia complications

## 2013-01-25 NOTE — Transfer of Care (Signed)
Immediate Anesthesia Transfer of Care Note  Patient: [name]  Procedure(s) Performed: Procedure(s): APPENDECTOMY LAPAROSCOPIC (N/A)  Patient Location: PACU  Anesthesia Type:General  Level of Consciousness: awake  Airway & Oxygen Therapy: Patient Spontanous Breathing and Patient connected to face mask oxygen  Post-op Assessment: Report given to PACU RN and Post -op Vital signs reviewed and stable  Post vital signs: Reviewed and stable  Complications: No apparent anesthesia complications

## 2013-01-25 NOTE — ED Notes (Signed)
Pt states that he does not feel the urge to urinate. Pt states he will try to give sample when he can. Pt given urinal at bedside

## 2013-01-25 NOTE — Preoperative (Signed)
Beta Blockers   Reason not to administer Beta Blockers:Pt. not on beta blocker at home

## 2013-01-25 NOTE — H&P (Signed)
[name] is an 72 y.o. male.   Chief Complaint: abdominal pain HPI: the patient is a 72 year old male with a two-day history of abdominal pain initially generalized and localized to the right lower quadrant. The patient states he had some constipation. He was constipated. Upon evaluation ED the  Patient underwent evaluation with CT scan which revealed a dilated appendix with possible early perforation.    History reviewed. No pertinent past medical history.  Past Surgical History  Procedure Laterality Date  . Joint replacement      bilateral knee surgery  . Back surgery      No family history on file. Social History:  reports that he has been smoking.  He does not have any smokeless tobacco history on file. He reports that he does not drink alcohol or use illicit drugs.  Allergies: No Known Allergies   (Not in a hospital admission)  Results for orders placed during the hospital encounter of 01/25/13 (from the past 48 hour(s))  CBC WITH DIFFERENTIAL     Status: Abnormal   Collection Time    01/25/13 10:05 AM      Result Value Range   WBC 14.6 (*) 4.0 - 10.5 K/uL   RBC 5.61  4.22 - 5.81 MIL/uL   Hemoglobin 19.1 (*) 13.0 - 17.0 g/dL   HCT 40.9 (*) 81.1 - 91.4 %   MCV 94.1  78.0 - 100.0 fL   MCH 34.0  26.0 - 34.0 pg   MCHC 36.2 (*) 30.0 - 36.0 g/dL   RDW 78.2  95.6 - 21.3 %   Platelets 180  150 - 400 K/uL   Neutrophils Relative % 86 (*) 43 - 77 %   Neutro Abs 12.6 (*) 1.7 - 7.7 K/uL   Lymphocytes Relative 7 (*) 12 - 46 %   Lymphs Abs 1.0  0.7 - 4.0 K/uL   Monocytes Relative 7  3 - 12 %   Monocytes Absolute 1.0  0.1 - 1.0 K/uL   Eosinophils Relative 0  0 - 5 %   Eosinophils Absolute 0.0  0.0 - 0.7 K/uL   Basophils Relative 0  0 - 1 %   Basophils Absolute 0.0  0.0 - 0.1 K/uL  COMPREHENSIVE METABOLIC PANEL     Status: Abnormal   Collection Time    01/25/13 10:05 AM      Result Value Range   Sodium 133 (*) 135 - 145 mEq/L   Potassium 3.7  3.5 - 5.1 mEq/L   Chloride  98  96 - 112 mEq/L   CO2 22  19 - 32 mEq/L   Glucose, Bld 115 (*) 70 - 99 mg/dL   BUN 15  6 - 23 mg/dL   Creatinine, Ser 0.86  0.50 - 1.35 mg/dL   Calcium 9.4  8.4 - 57.8 mg/dL   Total Protein 7.1  6.0 - 8.3 g/dL   Albumin 4.0  3.5 - 5.2 g/dL   AST 14  0 - 37 U/L   ALT 11  0 - 53 U/L   Alkaline Phosphatase 78  39 - 117 U/L   Total Bilirubin 0.9  0.3 - 1.2 mg/dL   GFR calc non Af Amer >90  >90 mL/min   GFR calc Af Amer >90  >90 mL/min   Comment:            The eGFR has been calculated     using the CKD EPI equation.     This calculation has not been  validated in all clinical     situations.     eGFR's persistently     <90 mL/min signify     possible Chronic Kidney Disease.  LIPASE, BLOOD     Status: None   Collection Time    01/25/13 10:05 AM      Result Value Range   Lipase 12  11 - 59 U/L  URINALYSIS, ROUTINE W REFLEX MICROSCOPIC     Status: Abnormal   Collection Time    01/25/13 11:19 AM      Result Value Range   Color, Urine AMBER (*) YELLOW   Comment: BIOCHEMICALS MAY BE AFFECTED BY COLOR   APPearance CLOUDY (*) CLEAR   Specific Gravity, Urine 1.022  1.005 - 1.030   pH 5.5  5.0 - 8.0   Glucose, UA NEGATIVE  NEGATIVE mg/dL   Hgb urine dipstick NEGATIVE  NEGATIVE   Bilirubin Urine SMALL (*) NEGATIVE   Ketones, ur 40 (*) NEGATIVE mg/dL   Protein, ur NEGATIVE  NEGATIVE mg/dL   Urobilinogen, UA 0.2  0.0 - 1.0 mg/dL   Nitrite NEGATIVE  NEGATIVE   Leukocytes, UA NEGATIVE  NEGATIVE   Comment: MICROSCOPIC NOT DONE ON URINES WITH NEGATIVE PROTEIN, BLOOD, LEUKOCYTES, NITRITE, OR GLUCOSE <1000 mg/dL.   Dg Abd 1 View  01/25/2013   *RADIOLOGY REPORT*  Clinical Data: Abdominal pain, nausea and vomiting.  ABDOMEN - 1 VIEW  Comparison: None.  Findings: There is no evidence of bowel obstruction or ileus.  No abnormal calcifications are seen.  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  IMPRESSION: No acute  findings.   Original Report Authenticated By: Irish Lack, M.D.   Ct Abdomen Pelvis W Contrast  01/25/2013   *RADIOLOGY REPORT*  Clinical Data: Abdominal pain and tenderness.  Nausea. Leukocytosis.  CT ABDOMEN AND PELVIS WITH CONTRAST  Technique:  Multidetector CT imaging of the abdomen and pelvis was performed following the standard protocol during bolus administration of intravenous contrast.  Contrast: OMNIPAQUE IOHEXOL 300 MG/ML  SOLN  Comparison: None.  Findings: Diffuse enlargement and thickening of the appendix is seen as well as wall thickening involving the base of the cecum. Periappendiceal inflammatory changes are seen as well as a small amount of extraluminal gas medial to the appendix.  This is consistent with ruptured appendicitis.  There is no evidence of abscess or free fluid.  The abdominal parenchymal organs are normal in appearance except for a small left hepatic lobe cyst.  Gallbladder is unremarkable. No evidence of hydronephrosis.  No soft tissue masses or lymphadenopathy identified.  IMPRESSION:  Acute appendicitis, with small amount of adjacent extraluminal gas consistent with early rupture.  No evidence of abscess or free fluid.   Original Report Authenticated By: Myles Rosenthal, M.D.    Review of Systems  Constitutional: Positive for chills. Negative for fever.  HENT: Negative.   Eyes: Negative.   Respiratory: Negative.   Cardiovascular: Negative.   Gastrointestinal: Positive for nausea, vomiting, abdominal pain and constipation. Negative for diarrhea.  Genitourinary: Negative.   Musculoskeletal: Negative.   Skin: Negative.   Neurological: Negative.   All other systems reviewed and are negative.    Blood pressure 129/57, pulse 88, temperature 100.2 F (37.9 C), temperature source Oral, resp. rate 18, SpO2 96.00%. Physical Exam  Constitutional: He is oriented to person, place, and time. He appears well-developed and well-nourished.  HENT:  Head: Normocephalic  and atraumatic.  Eyes: Conjunctivae and EOM are normal. Pupils  are equal, round, and reactive to light.  Neck: Neck supple.  Cardiovascular: Normal rate, regular rhythm and normal heart sounds.   Respiratory: Effort normal and breath sounds normal.  GI: Soft. Bowel sounds are normal. He exhibits no distension and no mass. There is tenderness (right lower quadrant). There is no rebound and no guarding.  Musculoskeletal: Normal range of motion.  Neurological: He is alert and oriented to person, place, and time.     Assessment/Plan 72 year old male with acute appendicitis 1. We'll proceed to the operating room for lap appendectomy 2. All risks and benefits were discussed with the patient including infection, bleeding, damage to surrounding structures, possible ileus, possible abscess formation, and any other unforeseen complications. The patient voiced understanding and wished to proceed.   Marigene Ehlers., Bridget Westbrooks 01/25/2013, 1:18 PM

## 2013-01-25 NOTE — Anesthesia Preprocedure Evaluation (Signed)
Anesthesia Evaluation  Patient identified by MRN, date of birth, ID band Patient awake    Reviewed: Allergy & Precautions, H&P , NPO status , Patient's Chart, lab work & pertinent test results  Airway Mallampati: I TM Distance: >3 FB Neck ROM: Full    Dental  (+) Edentulous Upper and Dental Advisory Given   Pulmonary COPDCurrent Smoker,          Cardiovascular     Neuro/Psych    GI/Hepatic   Endo/Other    Renal/GU      Musculoskeletal   Abdominal   Peds  Hematology   Anesthesia Other Findings   Reproductive/Obstetrics                           Anesthesia Physical Anesthesia Plan  ASA: III  Anesthesia Plan: General   Post-op Pain Management:    Induction: Intravenous, Rapid sequence and Cricoid pressure planned  Airway Management Planned: Oral ETT  Additional Equipment:   Intra-op Plan:   Post-operative Plan: Extubation in OR  Informed Consent:   Dental advisory given  Plan Discussed with:   Anesthesia Plan Comments:         Anesthesia Quick Evaluation

## 2013-01-25 NOTE — ED Notes (Signed)
Pt in CT.

## 2013-01-25 NOTE — ED Notes (Signed)
Pt states LBM was Friday morning.  Pt states that he feels like he is blocked up.  Pt states he has passed a little gas this am.  Pt reports abdomen is distended and sore.  Pt is nauseated

## 2013-01-25 NOTE — Op Note (Signed)
Pre Operative Diagnosis:  Acute appendicitis  Post Operative Diagnosis: same  Procedure: Laparoscopic appendectomy with primary UHR and drain placement  Surgeon: Dr. [name]  Assistant: none  Anesthesia: GETA  EBL:  <5 cc  Complications: none  Counts: reported as correct x 2  Findings:  The patient had an acutely inflamed & perforated appendix with a necrotic base  Specimen: Appendix  Indications for procedure:  The patient is a 72 year old male with a history of periumbilical pain localized in the right lower quadrant patient had a CT scan which revealed signs consistent with acute appendicitis and possible small perforation, the patient back in for laparoscopic appendectomy.  Details of the procedure:The patient was taken back to the operating room. The patient was placed in supine position with bilateral SCDs in place. After appropriate anitbiotics were confirmed, a time-out was confirmed and all facts were verified.  A pneumoperitoneum of 14 mmHg was obtained via a Veress needle technique in the left lower quadrant quadrant.  A 5 mm trocar and 5 mm camera then placed intra-abdominally there is no injury to any intra-abdominal organs a 10 mm infraumbilical port was placed and direct visualization as was a 5 mm port in the suprapubic area. The appendix was identified  The appendix identified and cleaned down to the appendiceal base. The appendiceal artery was taken with Bovie cautery maintaining hemostasis, the mesoappendix was then incised.  The the appendiceal base was seen to be necrotic and had a perforation. There was minimal pus and stool spillage. The appendiceal base was then incised and the appendix is in place to latex back that was introduced into the abdomen. A drill silk was used in a figure-of-eight fashion to reapproximate the appendiceal orifice. This was also packed with a piece of surrounding appendices epiploicae.  I evacuated the fluid from the pelvis until the  effluent was clear. The omentum was brought over the appendiceal stump. A 19 [place] Blake drain was in place in the right lower quadrant and brought out through the suprapubic trocar. This was tied to the skin with a 3-0 Vicryl. The appendix a latex retrieval  bag was then retrieved via the supraumbilical port. #1 Vicryl was used to reapproximate the fascia at the umbilical hernia x2. The skin was reapproximated all port sites 3-0 Monocryl subcuticular fashion. The skin was dressed with Steri-Strips gauze and tape. The patient was awakened from general anesthesia was taken to recovery room in stable condition.

## 2013-01-25 NOTE — Progress Notes (Signed)
Pt arrived to floor via stretcher from PACU. VSS.  Pt drowsy but arousable and pt oriented.

## 2013-01-25 NOTE — ED Provider Notes (Signed)
History    CSN: [id number] Arrival date & time 01/25/13  4098  First MD Initiated Contact with Patient 01/25/13 [phone]     Chief Complaint  Patient presents with  . Constipation   (Consider location/radiation/quality/duration/timing/severity/associated sxs/prior Treatment) Patient is a 72 y.o. male presenting with constipation. The history is provided by the patient.  Constipation Associated symptoms: no abdominal pain, no back pain and no fever   pt states he always has a regular, large bm once a day, but that in past 2 days no bm. Feels constipated, mildly bloated.  This morning nauseated, drank a large orange drink which made him feel worse, so he stuck finger in throat and induced episode nv. Emesis not bloody or bilious. No prior abd surgery. No abd pain. No dysuria or gu c/o. No fever or chills. Denies recent change in meds or diet. States otherwise feels at his normal baseline well state of health.    History reviewed. No pertinent past medical history. Past Surgical History  Procedure Laterality Date  . Joint replacement      bilateral knee surgery  . Back surgery     No family history on file. History  Substance Use Topics  . Smoking status: Current Every Day Smoker  . Smokeless tobacco: Not on file  . Alcohol Use: No    Review of Systems  Constitutional: Negative for fever and chills.  HENT: Negative for neck pain.   Eyes: Negative for redness.  Respiratory: Negative for shortness of breath.   Cardiovascular: Negative for chest pain.  Gastrointestinal: Positive for constipation. Negative for abdominal pain.  Genitourinary: Negative for flank pain.  Musculoskeletal: Negative for back pain.  Skin: Negative for rash.  Neurological: Negative for headaches.  Hematological: Does not bruise/bleed easily.  Psychiatric/Behavioral: Negative for confusion.    Allergies  Review of patient's allergies indicates no known allergies.  Home Medications   Current  Outpatient Rx  Name  Route  Sig  Dispense  Refill  . aspirin EC 81 MG tablet   Oral   Take 81 mg by mouth daily.          BP 162/70  Pulse 93  Temp(Src) 98.5 F (36.9 C) (Oral)  Resp 18 Physical Exam  Nursing note and vitals reviewed. Constitutional: He is oriented to person, place, and time. He appears well-developed and well-nourished. No distress.  HENT:  Mouth/Throat: Oropharynx is clear and moist.  Eyes: Conjunctivae are normal. No scleral icterus.  Neck: Neck supple. No tracheal deviation present.  Cardiovascular: Normal rate, regular rhythm, normal heart sounds and intact distal pulses.   Pulmonary/Chest: Effort normal and breath sounds normal. No accessory muscle usage. No respiratory distress.  Abdominal: Soft. Bowel sounds are normal. He exhibits no distension and no mass. There is no tenderness. There is no rebound and no guarding.  No hernia.   Genitourinary:  No cva tenderness. Rectal very small amt soft stool, no impaction felt, heme neg.   Musculoskeletal: Normal range of motion. He exhibits no edema and no tenderness.  Neurological: He is alert and oriented to person, place, and time.  Skin: Skin is warm and dry.  Psychiatric: He has a normal mood and affect.    ED Course  Procedures (including critical care time)  Results for orders placed during the hospital encounter of 01/25/13  CBC WITH DIFFERENTIAL      Result Value Range   WBC 14.6 (*) 4.0 - 10.5 K/uL   RBC 5.61  4.22 - 5.81 MIL/uL  Hemoglobin 19.1 (*) 13.0 - 17.0 g/dL   HCT 81.1 (*) 91.4 - 78.2 %   MCV 94.1  78.0 - 100.0 fL   MCH 34.0  26.0 - 34.0 pg   MCHC 36.2 (*) 30.0 - 36.0 g/dL   RDW 95.6  21.3 - 08.6 %   Platelets 180  150 - 400 K/uL   Neutrophils Relative % 86 (*) 43 - 77 %   Neutro Abs 12.6 (*) 1.7 - 7.7 K/uL   Lymphocytes Relative 7 (*) 12 - 46 %   Lymphs Abs 1.0  0.7 - 4.0 K/uL   Monocytes Relative 7  3 - 12 %   Monocytes Absolute 1.0  0.1 - 1.0 K/uL   Eosinophils Relative 0  0  - 5 %   Eosinophils Absolute 0.0  0.0 - 0.7 K/uL   Basophils Relative 0  0 - 1 %   Basophils Absolute 0.0  0.0 - 0.1 K/uL  COMPREHENSIVE METABOLIC PANEL      Result Value Range   Sodium 133 (*) 135 - 145 mEq/L   Potassium 3.7  3.5 - 5.1 mEq/L   Chloride 98  96 - 112 mEq/L   CO2 22  19 - 32 mEq/L   Glucose, Bld 115 (*) 70 - 99 mg/dL   BUN 15  6 - 23 mg/dL   Creatinine, Ser 5.78  0.50 - 1.35 mg/dL   Calcium 9.4  8.4 - 46.9 mg/dL   Total Protein 7.1  6.0 - 8.3 g/dL   Albumin 4.0  3.5 - 5.2 g/dL   AST 14  0 - 37 U/L   ALT 11  0 - 53 U/L   Alkaline Phosphatase 78  39 - 117 U/L   Total Bilirubin 0.9  0.3 - 1.2 mg/dL   GFR calc non Af Amer >90  >90 mL/min   GFR calc Af Amer >90  >90 mL/min  URINALYSIS, ROUTINE W REFLEX MICROSCOPIC      Result Value Range   Color, Urine AMBER (*) YELLOW   APPearance CLOUDY (*) CLEAR   Specific Gravity, Urine 1.022  1.005 - 1.030   pH 5.5  5.0 - 8.0   Glucose, UA NEGATIVE  NEGATIVE mg/dL   Hgb urine dipstick NEGATIVE  NEGATIVE   Bilirubin Urine SMALL (*) NEGATIVE   Ketones, ur 40 (*) NEGATIVE mg/dL   Protein, ur NEGATIVE  NEGATIVE mg/dL   Urobilinogen, UA 0.2  0.0 - 1.0 mg/dL   Nitrite NEGATIVE  NEGATIVE   Leukocytes, UA NEGATIVE  NEGATIVE  LIPASE, BLOOD      Result Value Range   Lipase 12  11 - 59 U/L   Dg Abd 1 View  01/25/2013   *RADIOLOGY REPORT*  Clinical Data: Abdominal pain, nausea and vomiting.  ABDOMEN - 1 VIEW  Comparison: None.  Findings: There is no evidence of bowel obstruction or ileus.  No abnormal calcifications are seen.  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  IMPRESSION: No acute findings.   Original Report Authenticated By: Irish Lack, M.D.   Ct Abdomen Pelvis W Contrast  01/25/2013   *RADIOLOGY REPORT*  Clinical Data: Abdominal pain and tenderness.  Nausea. Leukocytosis.  CT ABDOMEN AND PELVIS WITH CONTRAST  Technique:  Multidetector CT imaging of the  abdomen and pelvis was performed following the standard protocol during bolus administration of intravenous contrast.  Contrast: OMNIPAQUE IOHEXOL 300 MG/ML  SOLN  Comparison: None.  Findings: Diffuse enlargement and thickening  of the appendix is seen as well as wall thickening involving the base of the cecum. Periappendiceal inflammatory changes are seen as well as a small amount of extraluminal gas medial to the appendix.  This is consistent with ruptured appendicitis.  There is no evidence of abscess or free fluid.  The abdominal parenchymal organs are normal in appearance except for a small left hepatic lobe cyst.  Gallbladder is unremarkable. No evidence of hydronephrosis.  No soft tissue masses or lymphadenopathy identified.  IMPRESSION:  Acute appendicitis, with small amount of adjacent extraluminal gas consistent with early rupture.  No evidence of abscess or free fluid.   Original Report Authenticated By: Myles Rosenthal, M.D.     MDM  Zofran. Po fluids.  Reviewed nursing notes and prior charts for additional history.   Pts abd exam initially unremarkable, but given no hx chronic constipation, change in his baseline extremely normal bowel pattern, and episode of nv, labs and xr ordered.  Wbc elevated, rectal minimal stool impaction, mild mid abd tenderness on recheck - will get ct.  Ct positive for appendicitis. Discussed w gen surgery, Dr Derrell Lolling, will give dose invanz, pt to OR.    Suzi Roots, MD 01/25/13 [phone]

## 2013-01-25 NOTE — Anesthesia Procedure Notes (Addendum)
Procedure Name: Intubation Date/Time: 01/25/2013 3:16 PM Performed by: Alanda Amass A Pre-anesthesia Checklist: Patient identified, Emergency Drugs available, Suction available, Patient being monitored and Timeout performed Patient Re-evaluated:Patient Re-evaluated prior to inductionOxygen Delivery Method: Circle system utilized Preoxygenation: Pre-oxygenation with 100% oxygen Intubation Type: IV induction, Rapid sequence and Cricoid Pressure applied Laryngoscope Size: Mac and 3 Grade View: Grade II Tube type: Oral Tube size: 7.5 mm Number of attempts: 1 Airway Equipment and Method: Stylet Placement Confirmation: ETT inserted through vocal cords under direct vision,  breath sounds checked- equal and bilateral and positive ETCO2 Secured at: 21 cm Tube secured with: Tape Dental Injury: Teeth and Oropharynx as per pre-operative assessment

## 2013-01-26 LAB — CBC
HCT: 50.9 % (ref 39.0–52.0)
Hemoglobin: 17.7 g/dL — ABNORMAL HIGH (ref 13.0–17.0)
MCH: 32.8 pg (ref 26.0–34.0)
MCV: 94.3 fL (ref 78.0–100.0)
RBC: 5.4 MIL/uL (ref 4.22–5.81)
WBC: 16.4 10*3/uL — ABNORMAL HIGH (ref 4.0–10.5)

## 2013-01-26 NOTE — Progress Notes (Signed)
Patient ID: [name], male   DOB: [birth year], 72 y.o.   MRN: [id number] 1 Day Post-Op  Subjective: Pt reports abd sore but improving, denies n/v, tolerated clears this am  Objective: Vital signs in last 24 hours: Temp:  [98.3 F (36.8 C)-100.2 F (37.9 C)] 98.4 F (36.9 C) (07/14 0554) Pulse Rate:  [75-116] 82 (07/14 0554) Resp:  [15-26] 18 (07/14 0554) BP: (117-162)/(45-70) 119/62 mmHg (07/14 0554) SpO2:  [94 %-98 %] 95 % (07/14 0554) Weight:  [210 lb (95.255 kg)] 210 lb (95.255 kg) (07/13 1810) Last BM Date: 01/25/13  Intake/Output from previous day: 07/13 0701 - 07/14 0700 In: 1941.3 [I.V.:1941.3] Out: 1560 [Urine:1450; Drains:90; Blood:20] Intake/Output this shift:    PE: Abd: soft, tender over incisions, drain with somewhat cloudy sang output, dressings dry General: NAD  Lab Results:   Recent Labs  01/25/13 1005 01/26/13 0500  WBC 14.6* 16.4*  HGB 19.1* 17.7*  HCT 52.8* 50.9  PLT 180 158   BMET  Recent Labs  01/25/13 1005  NA 133*  K 3.7  CL 98  CO2 22  GLUCOSE 115*  BUN 15  CREATININE 0.72  CALCIUM 9.4   PT/INR No results found for this basename: LABPROT, INR,  in the last 72 hours CMP     Component Value Date/Time   NA 133* 01/25/2013 1005   K 3.7 01/25/2013 1005   CL 98 01/25/2013 1005   CO2 22 01/25/2013 1005   GLUCOSE 115* 01/25/2013 1005   BUN 15 01/25/2013 1005   CREATININE 0.72 01/25/2013 1005   CALCIUM 9.4 01/25/2013 1005   PROT 7.1 01/25/2013 1005   ALBUMIN 4.0 01/25/2013 1005   AST 14 01/25/2013 1005   ALT 11 01/25/2013 1005   ALKPHOS 78 01/25/2013 1005   BILITOT 0.9 01/25/2013 1005   GFRNONAA >90 01/25/2013 1005   GFRAA >90 01/25/2013 1005   Lipase     Component Value Date/Time   LIPASE 12 01/25/2013 1005       Studies/Results: Dg Abd 1 View  01/25/2013   *RADIOLOGY REPORT*  Clinical Data: Abdominal pain, nausea and vomiting.  ABDOMEN - 1 VIEW  Comparison: None.  Findings: There is no evidence of bowel obstruction or ileus.   No abnormal calcifications are seen.  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  IMPRESSION: No acute findings.   Original Report Authenticated By: Irish Lack, M.D.   Ct Abdomen Pelvis W Contrast  01/25/2013   *RADIOLOGY REPORT*  Clinical Data: Abdominal pain and tenderness.  Nausea. Leukocytosis.  CT ABDOMEN AND PELVIS WITH CONTRAST  Technique:  Multidetector CT imaging of the abdomen and pelvis was performed following the standard protocol during bolus administration of intravenous contrast.  Contrast: OMNIPAQUE IOHEXOL 300 MG/ML  SOLN  Comparison: None.  Findings: Diffuse enlargement and thickening of the appendix is seen as well as wall thickening involving the base of the cecum. Periappendiceal inflammatory changes are seen as well as a small amount of extraluminal gas medial to the appendix.  This is consistent with ruptured appendicitis.  There is no evidence of abscess or free fluid.  The abdominal parenchymal organs are normal in appearance except for a small left hepatic lobe cyst.  Gallbladder is unremarkable. No evidence of hydronephrosis.  No soft tissue masses or lymphadenopathy identified.  IMPRESSION:  Acute appendicitis, with small amount of adjacent extraluminal gas consistent with early rupture.  No evidence of abscess or free fluid.   Original  Report Authenticated By: Myles Rosenthal, M.D.    Anti-infectives: Anti-infectives   Start     Dose/Rate Route Frequency Ordered Stop   01/26/13 1530  ertapenem (INVANZ) 1 g in sodium chloride 0.9 % 50 mL IVPB     1 g 100 mL/hr over 30 Minutes Intravenous Every 24 hours 01/25/13 1816     01/25/13 1830  ertapenem (INVANZ) 1 g in sodium chloride 0.9 % 50 mL IVPB  Status:  Discontinued     1 g 100 mL/hr over 30 Minutes Intravenous  Once 01/25/13 1816 01/25/13 1819   01/25/13 1400  [MAR Hold]  ertapenem (INVANZ) 1 g in sodium chloride 0.9 % 50 mL IVPB     (On MAR Hold  since 01/25/13 1332)   1 g 100 mL/hr over 30 Minutes Intravenous  Once 01/25/13 1249 01/25/13 1519   01/25/13 1245  Ampicillin-Sulbactam (UNASYN) 3 g in sodium chloride 0.9 % 100 mL IVPB  Status:  Discontinued     3 g 100 mL/hr over 60 Minutes Intravenous  Once 01/25/13 1233 01/25/13 1249       Assessment/Plan POD#1-lap appy, perf appy: will need several days of IV abx, will advance diet to fulls then as tolerated, recheck CBC in am, ambulated as tolerated   LOS: 1 day    Denelle Capurro 01/26/2013

## 2013-01-27 ENCOUNTER — Encounter (HOSPITAL_COMMUNITY): Payer: Self-pay | Admitting: General Surgery

## 2013-01-27 LAB — CBC
HCT: 50.5 % (ref 39.0–52.0)
Hemoglobin: 17.2 g/dL — ABNORMAL HIGH (ref 13.0–17.0)
MCH: 32.5 pg (ref 26.0–34.0)
MCHC: 34.1 g/dL (ref 30.0–36.0)
MCV: 95.5 fL (ref 78.0–100.0)
Platelets: 157 10*3/uL (ref 150–400)
RBC: 5.29 MIL/uL (ref 4.22–5.81)
RDW: 15.2 % (ref 11.5–15.5)
WBC: 13.6 10*3/uL — ABNORMAL HIGH (ref 4.0–10.5)

## 2013-01-27 MED ORDER — ASPIRIN EC 81 MG PO TBEC
81.0000 mg | DELAYED_RELEASE_TABLET | Freq: Every day | ORAL | Status: DC
  Administered 2013-01-27 – 2013-01-30 (×4): 81 mg via ORAL
  Filled 2013-01-27 (×4): qty 1

## 2013-01-27 NOTE — Progress Notes (Signed)
2 Days Post-Op  Subjective: Still feels pretty rough, some nausea after eating.  Still very sore..  Objective: Vital signs in last 24 hours: Temp:  [98.6 F (37 C)-98.8 F (37.1 C)] 98.6 F (37 C) (07/15 0513) Pulse Rate:  [83-94] 87 (07/15 0513) Resp:  [15-16] 16 (07/15 0513) BP: (120-154)/(58-71) 154/70 mmHg (07/15 0513) SpO2:  [92 %-96 %] 96 % (07/15 0513) Last BM Date: 01/23/13 Full liquid diet 115 ml from drain. Afebrile, VSS, WBC trending down Intake/Output from previous day: 07/14 0701 - 07/15 0700 In: 648.8 [I.V.:598.8; IV Piggyback:50] Out: 1415 [Urine:1300; Drains:115] Intake/Output this shift:    General appearance: alert, cooperative, no distress and clearly still feels bad Resp: clear to auscultation bilaterally GI: soft, still very tender, incisions all look fine.  drain is clear and he notes less fluid.  No bowel sounds, no BM, no flatus.  Lab Results:   Recent Labs  01/26/13 0500 01/27/13 0620  WBC 16.4* 13.6*  HGB 17.7* 17.2*  HCT 50.9 50.5  PLT 158 157    BMET  Recent Labs  01/25/13 1005  NA 133*  K 3.7  CL 98  CO2 22  GLUCOSE 115*  BUN 15  CREATININE 0.72  CALCIUM 9.4   PT/INR No results found for this basename: LABPROT, INR,  in the last 72 hours   Recent Labs Lab 01/25/13 1005  AST 14  ALT 11  ALKPHOS 78  BILITOT 0.9  PROT 7.1  ALBUMIN 4.0     Lipase     Component Value Date/Time   LIPASE 12 01/25/2013 1005     Studies/Results: Dg Abd 1 View  01/25/2013   *RADIOLOGY REPORT*  Clinical Data: Abdominal pain, nausea and vomiting.  ABDOMEN - 1 VIEW  Comparison: None.  Findings: There is no evidence of bowel obstruction or ileus.  No abnormal calcifications are seen.  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  IMPRESSION: No acute findings.   Original Report Authenticated By: Irish Lack, M.D.   Ct Abdomen Pelvis W Contrast  01/25/2013   *RADIOLOGY  REPORT*  Clinical Data: Abdominal pain and tenderness.  Nausea. Leukocytosis.  CT ABDOMEN AND PELVIS WITH CONTRAST  Technique:  Multidetector CT imaging of the abdomen and pelvis was performed following the standard protocol during bolus administration of intravenous contrast.  Contrast: OMNIPAQUE IOHEXOL 300 MG/ML  SOLN  Comparison: None.  Findings: Diffuse enlargement and thickening of the appendix is seen as well as wall thickening involving the base of the cecum. Periappendiceal inflammatory changes are seen as well as a small amount of extraluminal gas medial to the appendix.  This is consistent with ruptured appendicitis.  There is no evidence of abscess or free fluid.  The abdominal parenchymal organs are normal in appearance except for a small left hepatic lobe cyst.  Gallbladder is unremarkable. No evidence of hydronephrosis.  No soft tissue masses or lymphadenopathy identified.  IMPRESSION:  Acute appendicitis, with small amount of adjacent extraluminal gas consistent with early rupture.  No evidence of abscess or free fluid.   Original Report Authenticated By: Myles Rosenthal, M.D.    Medications: . docusate sodium  100 mg Oral BID  . ertapenem (INVANZ) IV  1 g Intravenous Q24H    Assessment/Plan Acute appendicitis with perforation. (The patient had an acutely inflamed & perforated appendix with a necrotic base) S/p: Laparoscopic appendectomy with primary UHR and drain placement, 01/25/2013, [name], MD. 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  Continue to mobilize, continue antibiotics.  He doesn't even feel like a cigarette so he doesn't think he needs nicotine patch.  Encourage IS. Restart Asprin 81 mg daily.  I will recheck labs tomorrow.   This will be day 3 of INVANZ.      LOS: 2 days    [name] 01/27/2013

## 2013-01-28 ENCOUNTER — Encounter (HOSPITAL_COMMUNITY): Payer: Self-pay | Admitting: General Surgery

## 2013-01-28 DIAGNOSIS — K3533 Acute appendicitis with perforation and localized peritonitis, with abscess: Principal | ICD-10-CM

## 2013-01-28 DIAGNOSIS — F17200 Nicotine dependence, unspecified, uncomplicated: Secondary | ICD-10-CM

## 2013-01-28 DIAGNOSIS — K567 Ileus, unspecified: Secondary | ICD-10-CM

## 2013-01-28 HISTORY — DX: Acute appendicitis with perforation, localized peritonitis, and gangrene, with abscess: K35.33

## 2013-01-28 HISTORY — DX: Nicotine dependence, unspecified, uncomplicated: F17.200

## 2013-01-28 HISTORY — DX: Morbid (severe) obesity due to excess calories: E66.01

## 2013-01-28 HISTORY — DX: Ileus, unspecified: K56.7

## 2013-01-28 LAB — BASIC METABOLIC PANEL WITH GFR
BUN: 11 mg/dL (ref 6–23)
CO2: 29 meq/L (ref 19–32)
Calcium: 8.5 mg/dL (ref 8.4–10.5)
Chloride: 101 meq/L (ref 96–112)
Creatinine, Ser: 0.69 mg/dL (ref 0.50–1.35)
GFR calc Af Amer: >90 mL/min (ref >90)
GFR calc non Af Amer: >90 mL/min (ref >90)
Glucose, Bld: 124 mg/dL — ABNORMAL HIGH (ref 70–99)
Potassium: 3.6 meq/L (ref 3.5–5.1)
Sodium: 137 meq/L (ref 135–145)

## 2013-01-28 LAB — CBC
HCT: 49.7 % (ref 39.0–52.0)
Hemoglobin: 17.8 g/dL — ABNORMAL HIGH (ref 13.0–17.0)
MCH: 33.9 pg (ref 26.0–34.0)
MCHC: 35.8 g/dL (ref 30.0–36.0)
MCV: 94.7 fL (ref 78.0–100.0)
Platelets: 184 K/uL (ref 150–400)
RBC: 5.25 MIL/uL (ref 4.22–5.81)
RDW: 15.1 % (ref 11.5–15.5)
WBC: 12.4 K/uL — ABNORMAL HIGH (ref 4.0–10.5)

## 2013-01-28 MED ORDER — IPRATROPIUM-ALBUTEROL 20-100 MCG/ACT IN AERS
2.0000 | INHALATION_SPRAY | Freq: Four times a day (QID) | RESPIRATORY_TRACT | Status: DC | PRN
  Filled 2013-01-28: qty 4

## 2013-01-28 NOTE — Progress Notes (Signed)
3 Days Post-Op  Subjective: Still having some pain and nausea.  Not real bad, but ongoing, no flatus so far either.  Objective: Vital signs in last 24 hours: Temp:  [97.9 F (36.6 C)-98.5 F (36.9 C)] 97.9 F (36.6 C) (07/16 0551) Pulse Rate:  [84-90] 90 (07/16 0551) Resp:  [16-20] 16 (07/16 0551) BP: (130-149)/(72-79) 149/79 mmHg (07/16 0551) SpO2:  [91 %-98 %] 93 % (07/16 0551) Last BM Date: 01/23/13 240 ml, PO recorded, 20 ml from drain. Afebrile, VSS, WBC is still up Today will be day 4 of Invanz Intake/Output from previous day: 07/15 0701 - 07/16 0700 In: 1887.5 [P.O.:240; I.V.:1647.5] Out: 846 [Urine:825; Emesis/NG output:1; Drains:20] Intake/Output this shift:    General appearance: alert, cooperative and no distress Resp: clear to auscultation bilaterally and He's wheezing bilat.  Says he wheezes at home too GI: soft, still distended very sore, drain is clear, incisions look fine  Lab Results:   Recent Labs  01/27/13 0620 01/28/13 0435  WBC 13.6* 12.4*  HGB 17.2* 17.8*  HCT 50.5 49.7  PLT 157 184    BMET  Recent Labs  01/25/13 1005 01/28/13 0435  NA 133* 137  K 3.7 3.6  CL 98 101  CO2 22 29  GLUCOSE 115* 124*  BUN 15 11  CREATININE 0.72 0.69  CALCIUM 9.4 8.5   PT/INR No results found for this basename: LABPROT, INR,  in the last 72 hours   Recent Labs Lab 01/25/13 1005  AST 14  ALT 11  ALKPHOS 78  BILITOT 0.9  PROT 7.1  ALBUMIN 4.0     Lipase     Component Value Date/Time   LIPASE 12 01/25/2013 1005     Studies/Results: No results found.  Medications: . aspirin EC  81 mg Oral Daily  . docusate sodium  100 mg Oral BID  . ertapenem (INVANZ) IV  1 g Intravenous Q24H    Assessment/Plan Acute appendicitis with perforation. (The patient had an acutely inflamed & perforated appendix with a necrotic base)  S/p: Laparoscopic appendectomy with primary UHR and drain placement, 01/25/2013, [name], MD.  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  LOS: 3 days    Amadea Keagy 01/28/2013

## 2013-01-29 NOTE — Progress Notes (Signed)
4 Days Post-Op  Subjective: Feels better, tolerating full liquids, had a Bm's but still feels like he needs to go again.  Objective: Vital signs in last 24 hours: Temp:  [97.9 F (36.6 C)-98 F (36.7 C)] 98 F (36.7 C) (07/17 0543) Pulse Rate:  [82-86] 82 (07/17 0543) Resp:  [16-18] 16 (07/17 0543) BP: (140-150)/(69-79) 147/69 mmHg (07/17 0543) SpO2:  [93 %-97 %] 93 % (07/17 0543) Last BM Date: 01/28/13  Regular diet Afebrile, VSS, WBC is up but trending down.  Intake/Output from previous day: 07/16 0701 - 07/17 0700 In: 1978.8 [I.V.:1878.8; IV Piggyback:100] Out: 855 [Urine:800; Drains:55] Intake/Output this shift: Total I/O In: 240 [P.O.:240] Out: 301 [Urine:300; Stool:1]  General appearance: alert, cooperative and no distress Resp: clear to auscultation bilaterally GI: soft, up in room, sore, but not bad, incisions look fine.  Drain is clear and serous..  Lab Results:   Recent Labs  01/27/13 0620 01/28/13 0435  WBC 13.6* 12.4*  HGB 17.2* 17.8*  HCT 50.5 49.7  PLT 157 184    BMET  Recent Labs  01/28/13 0435  NA 137  K 3.6  CL 101  CO2 29  GLUCOSE 124*  BUN 11  CREATININE 0.69  CALCIUM 8.5   PT/INR No results found for this basename: LABPROT, INR,  in the last 72 hours   Recent Labs Lab 01/25/13 1005  AST 14  ALT 11  ALKPHOS 78  BILITOT 0.9  PROT 7.1  ALBUMIN 4.0     Lipase     Component Value Date/Time   LIPASE 12 01/25/2013 1005     Studies/Results: No results found.  Medications: . aspirin EC  81 mg Oral Daily  . docusate sodium  100 mg Oral BID  . ertapenem (INVANZ) IV  1 g Intravenous Q24H    Assessment/Plan Acute appendicitis with perforation. (The patient had an acutely inflamed & perforated appendix with a necrotic base)  S/p: Laparoscopic appendectomy with primary UHR and drain placement, 01/25/2013, Kurt Filler, MD.  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  Give him another dose of Invanz today, and hope to send home tomorrow on Augmentin.  55 ml from drain yesterday, I will check on d/c of drain or sending him home with it.       LOS: 4 days    [name] 01/29/2013

## 2013-01-29 NOTE — Discharge Summary (Signed)
Physician Discharge Summary  Patient ID: AVEDIS BEVIS MRN: [id number] DOB/AGE: 1941/01/26 72 y.o.  Admit date: 01/25/2013 Discharge date: 01/30/2013  Admission Diagnoses:  Acute appendicitis Constipation  Discharge Diagnoses:  Acute appendicitis with perforation. (The patient had an acutely inflamed & perforated appendix with a necrotic base)  Post op ileus  Tobacco use 1.5 PPD/worked all his life at [employer]  Body mass index is 31.9   Principal Problem:   Acute appendicitis with peritoneal abscess Active Problems:   Ileus, postoperative   Tobacco use disorder   Obesity, morbid   PROCEDURES: S/p: Laparoscopic appendectomy with primary UHR and drain placement, 01/25/2013, [name], MD.    Hospital Course: the patient is a 72 year old male with a two-day history of abdominal pain initially generalized and localized to the right lower quadrant. The patient states he had some constipation. He was constipated. Upon evaluation ED the Patient underwent evaluation with CT scan which revealed a dilated appendix with possible early perforation.  He was taken to the OR and did in fact have a perforated appendix.  He tolerated the procedure well.  Post op he had an ileus, and was slow to open.  He started having BM's on 01/28/13 late in the day.  We kept him on 5 days of Invanz, and drain has had clear drainage.  His ileus has resolved and he wants to go home Plan 10 days of Augmentin and follow up in [facility] clinic.  Condition on D/C:  Improved  Disposition:        Future Appointments Provider Department Dept Phone   02/17/2013 11:30 AM [facility] [place] [phone]       Medication List         acetaminophen 325 MG tablet  Commonly known as:  TYLENOL  Do not take more than 4000 mg of Tylenol (acetaminophen) over a 24 hour period. There is tylenol in your prescribed pain pill.     amoxicillin-clavulanate 875-125 MG per tablet  Commonly  known as:  AUGMENTIN  Take 1 tablet by mouth every 12 (twelve) hours.     aspirin EC 81 MG tablet  Take 81 mg by mouth daily.     HYDROcodone-acetaminophen 5-325 MG per tablet  Commonly known as:  NORCO/VICODIN  Take 1-2 tablets by mouth every 4 (four) hours as needed.       Follow-up Information   Follow up with [facility] [place]. Schedule an appointment as soon as possible for a visit on 02/17/2013. (Your appointment is at 11:30, be there at least 30 minutes before for check in.  )    Contact information:   [address]   [place] [address] [phone]       Call Marga Melnick, MD. (Let him know you had surgery and that you are wheezing at home.)    Contact information:   [address] [place] [address] [phone]       Signed: Sherrie George 01/30/2013, 2:29 PM

## 2013-01-30 MED ORDER — AMOXICILLIN-POT CLAVULANATE 875-125 MG PO TABS: 1.0000 | ORAL_TABLET | Freq: Two times a day (BID) | ORAL | Status: DC

## 2013-01-30 MED ORDER — HYDROCODONE-ACETAMINOPHEN 5-325 MG PO TABS: 1.0000 | ORAL_TABLET | ORAL | Status: DC | PRN

## 2013-01-30 MED ORDER — ACETAMINOPHEN 325 MG PO TABS: ORAL_TABLET | ORAL | Status: DC

## 2013-01-30 NOTE — Progress Notes (Signed)
Pt discharged to home

## 2013-01-30 NOTE — Progress Notes (Signed)
5 Days Post-Op  Subjective: Continues to feel better. Tolerated regular diet. Is having BMs but continues to feel as though he needs to go after.  Objective: Vital signs in last 24 hours: Temp:  [97.2 F (36.2 C)-97.3 F (36.3 C)] 97.2 F (36.2 C) (07/18 0535) Pulse Rate:  [78-88] 78 (07/18 0535) Resp:  [18] 18 (07/18 0535) BP: (153-171)/(59-73) 153/68 mmHg (07/18 0535) SpO2:  [94 %-96 %] 96 % (07/18 0535) Last BM Date: 01/29/13  Intake/Output from previous day: 07/17 0701 - 07/18 0700 In: 990 [P.O.:240; I.V.:750] Out: 1767 [Urine:1750; Drains:15; Stool:2] Intake/Output this shift:    General appearance: alert, cooperative and no distress GI: soft, non-tender to palpation, incisions c/d/i, drain in place with about 10 mL serous fluid  Lab Results:   Recent Labs  01/28/13 0435  WBC 12.4*  HGB 17.8*  HCT 49.7  PLT 184    BMET  Recent Labs  01/28/13 0435  NA 137  K 3.6  CL 101  CO2 29  GLUCOSE 124*  BUN 11  CREATININE 0.69  CALCIUM 8.5   PT/INR No results found for this basename: LABPROT, INR,  in the last 72 hours   Recent Labs Lab 01/25/13 1005  AST 14  ALT 11  ALKPHOS 78  BILITOT 0.9  PROT 7.1  ALBUMIN 4.0     Lipase     Component Value Date/Time   LIPASE 12 01/25/2013 1005     Studies/Results: No results found.  Medications: . aspirin EC  81 mg Oral Daily  . docusate sodium  100 mg Oral BID  . ertapenem (INVANZ) IV  1 g Intravenous Q24H    Assessment/Plan 1. Acute appendicitis with perforation s/p lap appendectomy and drain placement on 01/25/13 by Dr. Derrell Lolling 2. Post op ileus  Plan:  discharge home today. Drain pulled and steri strips placed. Will send home with 10 days of PO augmentin. To follow-up in clinic in a couple of weeks.    LOS: 5 days    [name] 01/30/2013

## 2013-02-17 ENCOUNTER — Ambulatory Visit (INDEPENDENT_AMBULATORY_CARE_PROVIDER_SITE_OTHER): Payer: Medicare Other | Admitting: Internal Medicine

## 2013-02-17 ENCOUNTER — Encounter (INDEPENDENT_AMBULATORY_CARE_PROVIDER_SITE_OTHER): Payer: Self-pay | Admitting: Internal Medicine

## 2013-02-17 VITALS — BP 158/76 | HR 104 | Temp 98.1°F | Resp 18 | Ht 68.0 in | Wt 194.6 lb

## 2013-02-17 DIAGNOSIS — K3533 Acute appendicitis with perforation and localized peritonitis, with abscess: Secondary | ICD-10-CM

## 2013-02-17 NOTE — Progress Notes (Signed)
  Subjective: Pt returns to the clinic today after undergoing laparoscopic appendectomy on 01/25/13 by Dr. Derrell Lolling.  The patient is tolerating their diet well and is having no severe pain.  Bowel function is good.  No problems with the wounds.  Pathology shows acute suppurative appendicitis with serositis consistent with rupture.  Objective: Vital signs in last 24 hours: Reviewed  PE: Abd: soft, non-tender, +bs, incisions well healed  Lab Results:  No results found for this basename: WBC, HGB, HCT, PLT,  in the last 72 hours BMET No results found for this basename: NA, K, CL, CO2, GLUCOSE, BUN, CREATININE, CALCIUM,  in the last 72 hours PT/INR No results found for this basename: LABPROT, INR,  in the last 72 hours CMP     Component Value Date/Time   NA 137 01/28/2013 0435   K 3.6 01/28/2013 0435   CL 101 01/28/2013 0435   CO2 29 01/28/2013 0435   GLUCOSE 124* 01/28/2013 0435   BUN 11 01/28/2013 0435   CREATININE 0.69 01/28/2013 0435   CALCIUM 8.5 01/28/2013 0435   PROT 7.1 01/25/2013 1005   ALBUMIN 4.0 01/25/2013 1005   AST 14 01/25/2013 1005   ALT 11 01/25/2013 1005   ALKPHOS 78 01/25/2013 1005   BILITOT 0.9 01/25/2013 1005   GFRNONAA >90 01/28/2013 0435   GFRAA >90 01/28/2013 0435   Lipase     Component Value Date/Time   LIPASE 12 01/25/2013 1005       Studies/Results: No results found.  Anti-infectives: Anti-infectives   None       Assessment/Plan  1.  S/P Laparoscopic Appendectomy: doing well, may resume regular activity without restrictions, Pt will follow up with [place] PRN and knows to call with questions or concerns.     Braxton Weisbecker 02/17/2013

## 2013-02-17 NOTE — Patient Instructions (Addendum)
May resume regular activity without restrictions. Follow up as needed. Call with questions or concerns.  

## 2013-04-29 ENCOUNTER — Ambulatory Visit: Payer: Medicare Other | Attending: Orthopaedic Surgery | Admitting: Physical Therapy

## 2013-04-29 DIAGNOSIS — R609 Edema, unspecified: Secondary | ICD-10-CM | POA: Insufficient documentation

## 2013-04-29 DIAGNOSIS — IMO0001 Reserved for inherently not codable concepts without codable children: Secondary | ICD-10-CM | POA: Insufficient documentation

## 2013-04-29 DIAGNOSIS — M25619 Stiffness of unspecified shoulder, not elsewhere classified: Secondary | ICD-10-CM | POA: Insufficient documentation

## 2013-04-29 DIAGNOSIS — M25519 Pain in unspecified shoulder: Secondary | ICD-10-CM | POA: Insufficient documentation

## 2013-05-01 ENCOUNTER — Ambulatory Visit: Payer: Medicare Other | Admitting: Physical Therapy

## 2013-05-04 ENCOUNTER — Ambulatory Visit: Payer: Medicare Other | Admitting: Physical Therapy

## 2013-05-07 ENCOUNTER — Ambulatory Visit: Payer: Medicare Other | Admitting: Physical Therapy

## 2013-05-11 ENCOUNTER — Ambulatory Visit: Payer: Medicare Other | Admitting: Physical Therapy

## 2013-05-13 ENCOUNTER — Ambulatory Visit: Payer: Medicare Other | Admitting: Physical Therapy

## 2013-05-19 ENCOUNTER — Ambulatory Visit: Payer: Medicare Other | Attending: Orthopaedic Surgery | Admitting: Physical Therapy

## 2013-05-19 DIAGNOSIS — M25619 Stiffness of unspecified shoulder, not elsewhere classified: Secondary | ICD-10-CM | POA: Insufficient documentation

## 2013-05-19 DIAGNOSIS — IMO0001 Reserved for inherently not codable concepts without codable children: Secondary | ICD-10-CM | POA: Insufficient documentation

## 2013-05-19 DIAGNOSIS — M25519 Pain in unspecified shoulder: Secondary | ICD-10-CM | POA: Insufficient documentation

## 2013-05-19 DIAGNOSIS — R609 Edema, unspecified: Secondary | ICD-10-CM | POA: Insufficient documentation

## 2013-05-21 ENCOUNTER — Ambulatory Visit: Payer: Medicare Other | Admitting: Physical Therapy

## 2013-05-26 ENCOUNTER — Ambulatory Visit: Payer: Medicare Other | Admitting: Physical Therapy

## 2013-05-28 ENCOUNTER — Ambulatory Visit: Payer: Medicare Other | Admitting: Physical Therapy

## 2013-06-02 ENCOUNTER — Ambulatory Visit: Payer: Medicare Other | Admitting: Physical Therapy

## 2013-06-04 ENCOUNTER — Ambulatory Visit: Payer: Medicare Other | Admitting: Physical Therapy

## 2013-06-08 ENCOUNTER — Ambulatory Visit: Payer: Medicare Other | Admitting: Physical Therapy

## 2013-06-10 ENCOUNTER — Ambulatory Visit: Payer: Medicare Other | Admitting: Physical Therapy

## 2013-08-14 ENCOUNTER — Other Ambulatory Visit: Payer: Self-pay | Admitting: Dermatology

## 2014-03-16 DIAGNOSIS — R7881 Bacteremia: Secondary | ICD-10-CM

## 2014-03-16 HISTORY — DX: Bacteremia: R78.81

## 2014-04-01 ENCOUNTER — Inpatient Hospital Stay (HOSPITAL_COMMUNITY): Payer: Medicare HMO

## 2014-04-01 ENCOUNTER — Emergency Department (HOSPITAL_COMMUNITY): Payer: Medicare HMO

## 2014-04-01 ENCOUNTER — Inpatient Hospital Stay (HOSPITAL_COMMUNITY)
Admission: EM | Admit: 2014-04-01 | Discharge: 2014-04-15 | DRG: 871 | Disposition: A | Discharge status: Skilled Nursing Facility | Payer: Medicare HMO | Attending: Family Medicine | Admitting: Family Medicine

## 2014-04-01 ENCOUNTER — Encounter (HOSPITAL_COMMUNITY): Payer: Self-pay | Admitting: Emergency Medicine

## 2014-04-01 DIAGNOSIS — Z7982 Long term (current) use of aspirin: Secondary | ICD-10-CM | POA: Diagnosis not present

## 2014-04-01 DIAGNOSIS — S39012A Strain of muscle, fascia and tendon of lower back, initial encounter: Secondary | ICD-10-CM

## 2014-04-01 DIAGNOSIS — K3533 Acute appendicitis with perforation and localized peritonitis, with abscess: Secondary | ICD-10-CM

## 2014-04-01 DIAGNOSIS — R195 Other fecal abnormalities: Secondary | ICD-10-CM | POA: Diagnosis not present

## 2014-04-01 DIAGNOSIS — A409 Streptococcal sepsis, unspecified: Secondary | ICD-10-CM | POA: Diagnosis present

## 2014-04-01 DIAGNOSIS — I10 Essential (primary) hypertension: Secondary | ICD-10-CM | POA: Diagnosis present

## 2014-04-01 DIAGNOSIS — K5900 Constipation, unspecified: Secondary | ICD-10-CM

## 2014-04-01 DIAGNOSIS — M4646 Discitis, unspecified, lumbar region: Secondary | ICD-10-CM | POA: Diagnosis present

## 2014-04-01 DIAGNOSIS — E860 Dehydration: Secondary | ICD-10-CM | POA: Diagnosis present

## 2014-04-01 DIAGNOSIS — R7881 Bacteremia: Secondary | ICD-10-CM

## 2014-04-01 DIAGNOSIS — E1169 Type 2 diabetes mellitus with other specified complication: Secondary | ICD-10-CM

## 2014-04-01 DIAGNOSIS — M549 Dorsalgia, unspecified: Secondary | ICD-10-CM | POA: Diagnosis present

## 2014-04-01 DIAGNOSIS — D72829 Elevated white blood cell count, unspecified: Secondary | ICD-10-CM | POA: Diagnosis present

## 2014-04-01 DIAGNOSIS — E861 Hypovolemia: Secondary | ICD-10-CM | POA: Diagnosis present

## 2014-04-01 DIAGNOSIS — E872 Acidosis, unspecified: Secondary | ICD-10-CM

## 2014-04-01 DIAGNOSIS — R7401 Elevation of levels of liver transaminase levels: Secondary | ICD-10-CM | POA: Diagnosis present

## 2014-04-01 DIAGNOSIS — A6000 Herpesviral infection of urogenital system, unspecified: Secondary | ICD-10-CM | POA: Diagnosis present

## 2014-04-01 DIAGNOSIS — M4626 Osteomyelitis of vertebra, lumbar region: Secondary | ICD-10-CM | POA: Diagnosis present

## 2014-04-01 DIAGNOSIS — M542 Cervicalgia: Secondary | ICD-10-CM | POA: Diagnosis present

## 2014-04-01 DIAGNOSIS — E876 Hypokalemia: Secondary | ICD-10-CM | POA: Diagnosis present

## 2014-04-01 DIAGNOSIS — M545 Low back pain, unspecified: Secondary | ICD-10-CM

## 2014-04-01 DIAGNOSIS — M4647 Discitis, unspecified, lumbosacral region: Secondary | ICD-10-CM

## 2014-04-01 DIAGNOSIS — L0291 Cutaneous abscess, unspecified: Secondary | ICD-10-CM

## 2014-04-01 DIAGNOSIS — K567 Ileus, unspecified: Secondary | ICD-10-CM

## 2014-04-01 DIAGNOSIS — I480 Paroxysmal atrial fibrillation: Secondary | ICD-10-CM | POA: Diagnosis present

## 2014-04-01 DIAGNOSIS — W06XXXA Fall from bed, initial encounter: Secondary | ICD-10-CM | POA: Diagnosis present

## 2014-04-01 DIAGNOSIS — I4891 Unspecified atrial fibrillation: Secondary | ICD-10-CM | POA: Diagnosis present

## 2014-04-01 DIAGNOSIS — A401 Sepsis due to streptococcus, group B: Principal | ICD-10-CM | POA: Diagnosis present

## 2014-04-01 DIAGNOSIS — K5909 Other constipation: Secondary | ICD-10-CM | POA: Diagnosis not present

## 2014-04-01 DIAGNOSIS — T3995XA Adverse effect of unspecified nonopioid analgesic, antipyretic and antirheumatic, initial encounter: Secondary | ICD-10-CM | POA: Diagnosis present

## 2014-04-01 DIAGNOSIS — Z96653 Presence of artificial knee joint, bilateral: Secondary | ICD-10-CM | POA: Diagnosis present

## 2014-04-01 DIAGNOSIS — K9189 Other postprocedural complications and disorders of digestive system: Secondary | ICD-10-CM

## 2014-04-01 DIAGNOSIS — F17210 Nicotine dependence, cigarettes, uncomplicated: Secondary | ICD-10-CM | POA: Diagnosis present

## 2014-04-01 DIAGNOSIS — B951 Streptococcus, group B, as the cause of diseases classified elsewhere: Secondary | ICD-10-CM

## 2014-04-01 DIAGNOSIS — M6282 Rhabdomyolysis: Secondary | ICD-10-CM | POA: Diagnosis present

## 2014-04-01 DIAGNOSIS — R197 Diarrhea, unspecified: Secondary | ICD-10-CM

## 2014-04-01 DIAGNOSIS — R740 Nonspecific elevation of levels of transaminase and lactic acid dehydrogenase [LDH]: Secondary | ICD-10-CM

## 2014-04-01 DIAGNOSIS — A419 Sepsis, unspecified organism: Secondary | ICD-10-CM | POA: Diagnosis present

## 2014-04-01 DIAGNOSIS — F17200 Nicotine dependence, unspecified, uncomplicated: Secondary | ICD-10-CM

## 2014-04-01 DIAGNOSIS — D696 Thrombocytopenia, unspecified: Secondary | ICD-10-CM | POA: Diagnosis present

## 2014-04-01 DIAGNOSIS — K7200 Acute and subacute hepatic failure without coma: Secondary | ICD-10-CM | POA: Diagnosis present

## 2014-04-01 DIAGNOSIS — Z6841 Body Mass Index (BMI) 40.0 and over, adult: Secondary | ICD-10-CM

## 2014-04-01 DIAGNOSIS — R1084 Generalized abdominal pain: Secondary | ICD-10-CM | POA: Diagnosis present

## 2014-04-01 DIAGNOSIS — M869 Osteomyelitis, unspecified: Secondary | ICD-10-CM

## 2014-04-01 DIAGNOSIS — R112 Nausea with vomiting, unspecified: Secondary | ICD-10-CM

## 2014-04-01 HISTORY — DX: Shortness of breath: R06.02

## 2014-04-01 LAB — I-STAT TROPONIN, ED: Troponin i, poc: 0.09 ng/mL (ref 0.00–0.08)

## 2014-04-01 LAB — CREATININE, SERUM
Creatinine, Ser: 0.8 mg/dL (ref 0.50–1.35)
GFR calc non Af Amer: 88 mL/min — ABNORMAL LOW (ref >90)

## 2014-04-01 LAB — TROPONIN I
Troponin I: <0.3 ng/mL (ref <0.30)
Troponin I: <0.3 ng/mL (ref <0.30)

## 2014-04-01 LAB — CBC
HCT: 50.7 % (ref 39.0–52.0)
Hemoglobin: 18.3 g/dL — ABNORMAL HIGH (ref 13.0–17.0)
MCH: 32.9 pg (ref 26.0–34.0)
MCHC: 36.1 g/dL — ABNORMAL HIGH (ref 30.0–36.0)
MCV: 91.2 fL (ref 78.0–100.0)
PLATELETS: 95 10*3/uL — AB (ref 150–400)
RBC: 5.56 MIL/uL (ref 4.22–5.81)
RDW: 14.3 % (ref 11.5–15.5)
WBC: 15.2 10*3/uL — AB (ref 4.0–10.5)

## 2014-04-01 LAB — URINALYSIS, ROUTINE W REFLEX MICROSCOPIC
Glucose, UA: NEGATIVE mg/dL
Ketones, ur: 40 mg/dL — AB
LEUKOCYTES UA: NEGATIVE
NITRITE: NEGATIVE
PH: 5.5 (ref 5.0–8.0)
Protein, ur: 100 mg/dL — AB
SPECIFIC GRAVITY, URINE: 1.023 (ref 1.005–1.030)
Urobilinogen, UA: 1 mg/dL (ref 0.0–1.0)

## 2014-04-01 LAB — COMPREHENSIVE METABOLIC PANEL
ALBUMIN: 3.2 g/dL — AB (ref 3.5–5.2)
ALT: 89 U/L — AB (ref 0–53)
AST: 254 U/L — AB (ref 0–37)
Alkaline Phosphatase: 80 U/L (ref 39–117)
Anion gap: 18 — ABNORMAL HIGH (ref 5–15)
BUN: 31 mg/dL — ABNORMAL HIGH (ref 6–23)
CALCIUM: 8.9 mg/dL (ref 8.4–10.5)
CO2: 23 meq/L (ref 19–32)
CREATININE: 1 mg/dL (ref 0.50–1.35)
Chloride: 90 mEq/L — ABNORMAL LOW (ref 96–112)
GFR calc Af Amer: 86 mL/min — ABNORMAL LOW (ref >90)
GFR calc non Af Amer: 74 mL/min — ABNORMAL LOW (ref >90)
Glucose, Bld: 94 mg/dL (ref 70–99)
Potassium: 3.9 mEq/L (ref 3.7–5.3)
SODIUM: 131 meq/L — AB (ref 137–147)
TOTAL PROTEIN: 7.2 g/dL (ref 6.0–8.3)
Total Bilirubin: 1 mg/dL (ref 0.3–1.2)

## 2014-04-01 LAB — I-STAT CG4 LACTIC ACID, ED: LACTIC ACID, VENOUS: 2.45 mmol/L — AB (ref 0.5–2.2)

## 2014-04-01 LAB — CBC WITH DIFFERENTIAL/PLATELET
BASOS ABS: 0 10*3/uL (ref 0.0–0.1)
BASOS PCT: 0 % (ref 0–1)
EOS ABS: 0 10*3/uL (ref 0.0–0.7)
EOS PCT: 0 % (ref 0–5)
HCT: 53.8 % — ABNORMAL HIGH (ref 39.0–52.0)
Hemoglobin: 19.2 g/dL — ABNORMAL HIGH (ref 13.0–17.0)
Lymphocytes Relative: 3 % — ABNORMAL LOW (ref 12–46)
Lymphs Abs: 0.5 10*3/uL — ABNORMAL LOW (ref 0.7–4.0)
MCH: 33.4 pg (ref 26.0–34.0)
MCHC: 35.7 g/dL (ref 30.0–36.0)
MCV: 93.7 fL (ref 78.0–100.0)
Monocytes Absolute: 1.8 10*3/uL — ABNORMAL HIGH (ref 0.1–1.0)
Monocytes Relative: 11 % (ref 3–12)
Neutro Abs: 14.3 10*3/uL — ABNORMAL HIGH (ref 1.7–7.7)
Neutrophils Relative %: 86 % — ABNORMAL HIGH (ref 43–77)
PLATELETS: 96 10*3/uL — AB (ref 150–400)
RBC: 5.74 MIL/uL (ref 4.22–5.81)
RDW: 14.4 % (ref 11.5–15.5)
WBC: 16.6 10*3/uL — AB (ref 4.0–10.5)

## 2014-04-01 LAB — URINE MICROSCOPIC-ADD ON

## 2014-04-01 LAB — ETHANOL: Alcohol, Ethyl (B): <11 mg/dL (ref 0–11)

## 2014-04-01 LAB — LACTIC ACID, PLASMA: LACTIC ACID, VENOUS: 1.1 mmol/L (ref 0.5–2.2)

## 2014-04-01 LAB — TSH: TSH: 0.976 u[IU]/mL (ref 0.350–4.500)

## 2014-04-01 LAB — SEDIMENTATION RATE: Sed Rate: 15 mm/hr (ref 0–16)

## 2014-04-01 LAB — PRO B NATRIURETIC PEPTIDE: Pro B Natriuretic peptide (BNP): 2858 pg/mL — ABNORMAL HIGH (ref 0–125)

## 2014-04-01 LAB — ACETAMINOPHEN LEVEL: Acetaminophen (Tylenol), Serum: <15 ug/mL (ref 10–30)

## 2014-04-01 LAB — CK
CK TOTAL: 7232 U/L — AB (ref 7–232)
Total CK: 12157 U/L — ABNORMAL HIGH (ref 7–232)

## 2014-04-01 MED ORDER — SODIUM CHLORIDE 0.9 % IV BOLUS (SEPSIS)
1000.0000 mL | Freq: Once | INTRAVENOUS | Status: AC
  Administered 2014-04-01: 1000 mL via INTRAVENOUS

## 2014-04-01 MED ORDER — DILTIAZEM HCL 100 MG IV SOLR
5.0000 mg/h | INTRAVENOUS | Status: DC
  Administered 2014-04-01 – 2014-04-03 (×3): 15 mg/h via INTRAVENOUS
  Filled 2014-04-01 (×4): qty 100

## 2014-04-01 MED ORDER — IOHEXOL 300 MG/ML  SOLN
100.0000 mL | Freq: Once | INTRAMUSCULAR | Status: AC | PRN
Start: 2014-04-01 — End: 2014-04-01
  Administered 2014-04-01: 100 mL via INTRAVENOUS

## 2014-04-01 MED ORDER — METOPROLOL TARTRATE 1 MG/ML IV SOLN
2.5000 mg | Freq: Once | INTRAVENOUS | Status: AC
  Administered 2014-04-01: 2.5 mg via INTRAVENOUS
  Filled 2014-04-01: qty 5

## 2014-04-01 MED ORDER — HEPARIN SODIUM (PORCINE) 5000 UNIT/ML IJ SOLN
5000.0000 [IU] | Freq: Three times a day (TID) | INTRAMUSCULAR | Status: DC
  Administered 2014-04-01: 5000 [IU] via SUBCUTANEOUS

## 2014-04-01 MED ORDER — SODIUM CHLORIDE 0.9 % IJ SOLN
3.0000 mL | Freq: Two times a day (BID) | INTRAMUSCULAR | Status: DC
  Administered 2014-04-03 – 2014-04-14 (×11): 3 mL via INTRAVENOUS

## 2014-04-01 MED ORDER — METRONIDAZOLE IN NACL 5-0.79 MG/ML-% IV SOLN
500.0000 mg | Freq: Three times a day (TID) | INTRAVENOUS | Status: DC
  Administered 2014-04-01: 500 mg via INTRAVENOUS
  Filled 2014-04-01 (×2): qty 100

## 2014-04-01 MED ORDER — PANTOPRAZOLE SODIUM 40 MG IV SOLR
40.0000 mg | Freq: Two times a day (BID) | INTRAVENOUS | Status: DC
  Administered 2014-04-02 – 2014-04-03 (×4): 40 mg via INTRAVENOUS
  Filled 2014-04-01 (×5): qty 40

## 2014-04-01 MED ORDER — CIPROFLOXACIN IN D5W 400 MG/200ML IV SOLN
400.0000 mg | Freq: Two times a day (BID) | INTRAVENOUS | Status: DC
  Administered 2014-04-01: 400 mg via INTRAVENOUS
  Filled 2014-04-01: qty 200

## 2014-04-01 MED ORDER — DILTIAZEM LOAD VIA INFUSION
10.0000 mg | Freq: Once | INTRAVENOUS | Status: AC
  Administered 2014-04-01: 10 mg via INTRAVENOUS
  Filled 2014-04-01: qty 10

## 2014-04-01 MED ORDER — DILTIAZEM HCL 100 MG IV SOLR
5.0000 mg/h | INTRAVENOUS | Status: DC
  Administered 2014-04-01: 5 mg/h via INTRAVENOUS

## 2014-04-01 MED ORDER — SODIUM CHLORIDE 0.9 % IV SOLN
INTRAVENOUS | Status: DC
  Administered 2014-04-02 – 2014-04-05 (×4): via INTRAVENOUS
  Administered 2014-04-05: 1000 mL via INTRAVENOUS
  Administered 2014-04-06 – 2014-04-12 (×8): via INTRAVENOUS

## 2014-04-01 MED ORDER — ONDANSETRON HCL 4 MG/2ML IJ SOLN
4.0000 mg | Freq: Once | INTRAMUSCULAR | Status: DC
  Filled 2014-04-01: qty 2

## 2014-04-01 MED ORDER — IOHEXOL 300 MG/ML  SOLN
25.0000 mL | Freq: Once | INTRAMUSCULAR | Status: AC | PRN
  Administered 2014-04-01: 25 mL via ORAL

## 2014-04-01 MED ORDER — SODIUM CHLORIDE 0.9 % IV SOLN
INTRAVENOUS | Status: DC
  Administered 2014-04-01: 17:00:00 via INTRAVENOUS

## 2014-04-01 MED ORDER — DILTIAZEM HCL 100 MG IV SOLR
5.0000 mg/h | Freq: Once | INTRAVENOUS | Status: AC
  Administered 2014-04-01: 20 mg/h via INTRAVENOUS

## 2014-04-01 NOTE — ED Notes (Addendum)
Spoke with Dr. Leonides Schanz: Informed her that patient has not responded to diltiazem, patients rate is still in the 150s, currently diltiazem is maxed out per current order instructions.  Her orders are to increase diltiazem to 20 mg/hr and she is going to consult with cardiology.

## 2014-04-01 NOTE — ED Notes (Signed)
Lactic acid results shown to Dr. Dina Rich and given to the charge nurse

## 2014-04-01 NOTE — ED Notes (Signed)
Spoke with Dr. Ernestina Patches:  Informed him that patient has not responded to diltiazem, he ordered additional bolus of normal saline.  He inquired about consulting with cardiology, will ask Dr. Leonides Schanz.

## 2014-04-01 NOTE — H&P (Addendum)
Hospitalist Admission History and Physical  Patient name: [name] Medical record number: [id number] Date of birth: Feb 05, 1941 Age: 73 y.o. Gender: male  Primary Care Provider: [name], MD  Chief Complaint: sepsis, dehydration, N/V/D, back pain, a fib w/ rvr, transaminitis   History of Present Illness:This is a 73 y.o. 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 Fib with RVR. Pt states that he was in otherwise normal state of health up until Monday when pt had progressive nausea, vomiting and diarrhea over 2-3 days. Also developed new onset back pain. GI output NBNB. Patient states he progressively got weak over the course of the week. Was at home yesterday and slid down the bed. Was unable to get up from the floor for several hours because of weakness. Back pain has been fairly constant. Denies any distal paresthesias or numbness, no bowel/bladder incontinence. Has had also had some mild neck pain. Denies any neck stiffness. No fevers or chills. Minimal headache.  On presentation to the ER, afebrile. Patient noted to be in A. Fib with RVR with heart rate into the 140s. Respirations intense 20s. Blood pressure in the 100s to 130s. Satting greater than 92% on room air. Notable labs include a white blood cell count 16.6, hemoglobin 19.2, platelets 96, sodium 131, creatinine 1.0, BUN 31, AST 254, ALT 89. Lactate 2.45. CK 12k. Trop 0.09, then <0.3. UA mildly indicative of infection. L. Spine x-ray shows general lumbar degeneration. Chest x-ray within normal limits.started on a diltiazem drip in the ER.  Assessment and Plan: [name] is a 73 y.o. year old male presenting with sepsis, dehydration, abd pain, back pain, transaminitis, afib w/ rvr    Active  Problems:   Dehydration   Leukocytosis   Atrial fibrillation with rapid ventricular response   Abdominal pain, generalized   Nausea vomiting and diarrhea   Back pain   Transaminitis   Sepsis   1-Afib w/ RVR  -status post diltiazem drip in the ER - cardiology consult pending -Hemodynamically stable thus far. -consider addition of IV metoprolol if HR persists >140 pending cards consult  -Cycle cardiac enzymes -2-D echo -ProBNP -Follow cardiology recommendations  2-sepsis -these criteria based on heart rate and white blood cell count -Noted leukocytosis may be secondary to hemoconcentration in the setting of dehydration -afebrile on presentation -predominant etiologies may include  gastrointestinal versus genitourinary versus lumbar sacral -UA indicative of infection on microscopy -However, will start patient on Cipro and Flagyl for gastrointestinal and genitourinary coverage -Palpable back pain on exam -Check sedimentation rate and CRP to assess for any laboratory markers for discitis -Panculture -followup CT of abdomen and pelvis given mild abdominal pain. -Trend lactate  3-dehydration -likely secondary to hypovolemia from GI losses -Noted elevated CK level status post fall -Hydrate fairly aggressively -Trend CK  4-transaminitis -Likely secondary to muscle injury in the setting of prolonged fall and elevated CK level -Hydrate patient -Followup CT abdomen pelvis -Check ethanol, Tylenol, hepatitis panel -Patient denies alcohol abuse  5-back pain -Remote history of lumbar surgery in the past -Differential diagnosis includes lumbosacral strain, infectious/inflammatory source -Noted palpable lumbar back pain on exam -Pending sedimentation rate and CRP -Also with upper back/neck pain -No true meningismus signs, though given overall presentation would benefit from neurology evaluation -We'll formally consult -May need MRI of the L-spine  6-abdominal  pain/nausea/vomiting/diarrhea -? Viral versus bacterial/infectious etiology -Cipro Flagyl for gastrointestinal coverage -Stool studies including stool culture, fecal active current, C. Difficile, stool for parasites -Mild epigastric pain on exam-high dose PPI -Followup CT of the abdomen and pelvis  7-Thrombocytopenia -plt 98 today in otherwise hypovolemic/hemoconcentrated pt  -hold anticoagulation  -check peripheral smear   FEN/GI: NPO. PPI  Prophylaxis: SCDs Disposition: pending furhter evaluation  Code Status:Full Code    Patient Active Problem List   Diagnosis Date Noted  . Dehydration 04/01/2014  . Acute appendicitis with peritoneal abscess 01/28/2013  . Ileus, postoperative 01/28/2013  . Tobacco use disorder 01/28/2013  . Obesity, morbid 01/28/2013  . IMPACTED CERUMEN 01/29/2008  . EAR PAIN, LEFT 01/29/2008  . URTICARIA 11/10/2007  . GENITAL HERPES, 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   Past Medical History: Past Medical History  Diagnosis Date  . Acute appendicitis with peritoneal abscess 01/28/2013  . Ileus, postoperative 01/28/2013  . Tobacco use disorder 01/28/2013  . Obesity, morbid 01/28/2013    Past Surgical History: Past Surgical History  Procedure Laterality Date  . Joint replacement      bilateral knee surgery  . Back surgery    . Laparoscopic appendectomy N/A 01/25/2013    Procedure: APPENDECTOMY LAPAROSCOPIC;  Surgeon: Ralene Ok, MD;  Location: [facility] OR;  Service: General;  Laterality: N/A;    Social History: History   Social History  . Marital Status: Single    Spouse Name: N/A    Number of Children: N/A  . Years of Education: N/A   Social History Main Topics  . Smoking status: Current Every Day Smoker -- 1.50 packs/day  . Smokeless tobacco: None  . Alcohol Use: No  . Drug Use: No  . Sexual Activity: None   Other Topics Concern  . None   Social History Narrative  . None    Family History: History reviewed. No pertinent family  history.  Allergies: No Known Allergies  Current Facility-Administered Medications  Medication Dose Route Frequency Provider Last Rate Last Dose  . 0.9 %  sodium chloride infusion   Intravenous Continuous Kristen N Ward, DO 125 mL/hr at 04/01/14 1729    . 0.9 %  sodium chloride infusion   Intravenous Continuous Shanda Howells, MD      . ciprofloxacin (CIPRO) IVPB 400 mg  400 mg Intravenous Q12H Shanda Howells, MD      . diltiazem (CARDIZEM) 100 mg in dextrose 5 % 100 mL (1 mg/mL) infusion  5-15 mg/hr Intravenous Continuous Kristen N Ward, DO 15 mL/hr at 04/01/14 1826 15 mg/hr at 04/01/14 1826  . heparin injection 5,000 Units  5,000 Units Subcutaneous 3 times per day Shanda Howells, MD      . metroNIDAZOLE (FLAGYL) IVPB 500 mg  500 mg Intravenous Q8H Shanda Howells, MD      . ondansetron [facility]) injection 4 mg  4 mg Intravenous Once Kristen N Ward, DO      . pantoprazole (PROTONIX) injection 40 mg  40 mg Intravenous Q12H Shanda Howells, MD      . sodium chloride 0.9 % bolus 1,000 mL  1,000 mL Intravenous Once Shanda Howells, MD      . sodium chloride 0.9 % injection 3 mL  3 mL Intravenous Q12H Shanda Howells, MD       Current Outpatient Prescriptions  Medication Sig Dispense Refill  . aspirin EC 81 MG tablet Take 81 mg by mouth daily.       Review Of Systems: 12 point ROS negative except as noted above in  HPI.  Physical Exam: Filed Vitals:   04/01/14 1915  BP: 129/58  Pulse: 58  Temp:   Resp: 25    General: cooperative and fatigued HEENT: PERRLA, extra ocular movement intact and dry oral mucosa Heart: Irregular rate and rhythm Lungs: clear to auscultation, no wheezes or rales and unlabored breathing Abdomen: mild generalized abdominal tenderness to palpation, positive bowel sounds Extremities: extremities normal, atraumatic, no cyanosis or edema Skin:no rashes, no ecchymoses Neurology: no focal deficits noted, mild generalized neck pain. No definitive meningeal signs on exam.  Labs  and Imaging: Lab Results  Component Value Date/Time   NA 131* 04/01/2014  2:14 PM   K 3.9 04/01/2014  2:14 PM   CL 90* 04/01/2014  2:14 PM   CO2 23 04/01/2014  2:14 PM   BUN 31* 04/01/2014  2:14 PM   CREATININE 1.00 04/01/2014  2:14 PM   GLUCOSE 94 04/01/2014  2:14 PM   Lab Results  Component Value Date   WBC 16.6* 04/01/2014   HGB 19.2* 04/01/2014   HCT 53.8* 04/01/2014   MCV 93.7 04/01/2014   PLT 96* 04/01/2014   Urinalysis    Component Value Date/Time   COLORURINE AMBER* 04/01/2014 1647   APPEARANCEUR CLOUDY* 04/01/2014 1647   LABSPEC 1.023 04/01/2014 1647   PHURINE 5.5 04/01/2014 1647   GLUCOSEU NEGATIVE 04/01/2014 1647   HGBUR LARGE* 04/01/2014 1647   BILIRUBINUR SMALL* 04/01/2014 1647   KETONESUR 40* 04/01/2014 1647   PROTEINUR 100* 04/01/2014 1647   UROBILINOGEN 1.0 04/01/2014 1647   NITRITE NEGATIVE 04/01/2014 [place] 04/01/2014 1647       Dg Chest 2 View  04/01/2014   CLINICAL DATA:  Shortness of breath.  Cough.  EXAM: CHEST  2 VIEW  COMPARISON:  CT scan dated 08/23/2005  FINDINGS: Heart size and pulmonary vascularity are normal. Lungs are clear. No acute osseous abnormality. Prominent right first costochondral junction.  IMPRESSION: No active cardiopulmonary disease.   Electronically Signed   By: Rozetta Nunnery M.D.   On: 04/01/2014 17:25   Dg Lumbar Spine Complete  04/01/2014   CLINICAL DATA:  Low back pain.  EXAM: LUMBAR SPINE - COMPLETE 4+ VIEW  COMPARISON:  CT abdomen and pelvis 01/25/2013  FINDINGS: Mild lumbar levoscoliosis his again seen with severe asymmetric right-sided disc space narrowing at L2-3 and L3-4 with associated vertebral body sclerosis and bridging osteophytes. 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 Trace retrolisthesis of L2 on L3 is unchanged and likely degenerative. There is no evidence of compression fracture. Atherosclerotic aortic calcification is noted.  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 No acute osseous abnormality identified.   Electronically Signed   By: Logan Bores   On: 04/01/2014 17:27           Shanda Howells MD  Pager: [phone]

## 2014-04-01 NOTE — ED Provider Notes (Addendum)
TIME SEEN: 3:12 PM  CHIEF COMPLAINT: Lower back pain, generalized weakness, vomiting, anorexia  HPI: Patient is a 73 y.o. M with history of tobacco use, 3 prior lower back surgeries who presents to the emergency department with complaints of generalized weakness and lower back pain. He reports that on Monday, 3 days ago he started vomiting. He reports that he had multiple episodes of nonbloody, nonbilious vomiting. He has had persistent nausea and anorexia since but no further vomiting. He did have diarrhea today and yesterday. He states he has felt very weak. He reports that on Tuesday, the day after his multiple episodes of vomiting, he started having lower back pain. Denies any history of injury. He states last night while trying to get into bed he slipped off the side of the bed and landed on the floor. He states he did not fall hard and did not hit his head or lose consciousness. He felt that he was so weak and having pain in his lower back that he could not golf the floor and stayed there for over 20 hours. He states that a friend came and found him this morning and helped him get off the floor. He denies any fevers, chills, chest pain or shortness of breath, abdominal pain, numbness or tingling, focal weakness, bowel or bladder incontinence, urinary retention. His last back surgery was over 8 years ago. No recent epidural injections. No history of IV drug use. He is not a diabetic. Patient is not on any anticoagulation.   PCP - none  ROS: See HPI Constitutional: no fever  Eyes: no drainage  ENT: no runny nose   Cardiovascular:  no chest pain  Resp: no SOB  GI:  vomiting GU: no dysuria Integumentary: no rash  Allergy: no hives  Musculoskeletal: no leg swelling  Neurological: no slurred speech ROS otherwise negative  PAST MEDICAL HISTORY/PAST SURGICAL HISTORY:  Past Medical History  Diagnosis Date  . Acute appendicitis with peritoneal abscess 01/28/2013  . Ileus, postoperative  01/28/2013  . Tobacco use disorder 01/28/2013  . Obesity, morbid 01/28/2013    MEDICATIONS:  Prior to Admission medications   Medication Sig Start Date End Date Taking? Authorizing Provider  aspirin EC 81 MG tablet Take 81 mg by mouth daily.    Historical Provider, MD    ALLERGIES:  No Known Allergies  SOCIAL HISTORY:  History  Substance Use Topics  . Smoking status: Current Every Day Smoker -- 1.50 packs/day  . Smokeless tobacco: Not on file  . Alcohol Use: No    FAMILY HISTORY: History reviewed. No pertinent family history.  EXAM: BP 138/71  Pulse 91  Temp(Src) 98.1 F (36.7 C) (Oral)  Resp 20  SpO2 99% CONSTITUTIONAL: Alert and oriented and responds appropriately to questions. Well-appearing; well-nourished HEAD: Normocephalic, atraumatic EYES: Conjunctivae clear, PERRL, extraocular movements intact ENT: normal nose; no rhinorrhea; very dry mucous membranes; pharynx without lesions noted NECK: Supple, no meningismus, no LAD; no midline spinal tenderness or step-off or deformity CARD: Irregularly irregular, tachycardic; S1 and S2 appreciated; no murmurs, no clicks, no rubs, no gallops RESP: Normal chest excursion without splinting or tachypnea; breath sounds clear and equal bilaterally; no wheezes, no rhonchi, no rales, no respiratory distress or hypoxia, increased work of breathing ABD/GI: Normal bowel sounds; non-distended; soft, non-tender, no rebound, no guarding, no peritoneal signs BACK:  The back appears normal; there is no CVA tenderness; patient has diffuse lower lumbar tenderness but no specific midline spinal tenderness. He is tender over his paraspinal  musculature bilaterally. No lesions noted. EXT: Normal ROM in all joints; non-tender to palpation; no edema; normal capillary refill; no cyanosis    SKIN: Normal color for age and race; warm NEURO: Moves all extremities equally; patient has weakness with hip flexion bilaterally but normal strength with hip  extension and dorsi and plantar flexion bilaterally, normal strength in his bilateral upper lower extremities, 2+ bilateral upper and lower extremity deep tendon reflexes, no clonus, cranial nerves II through XII intact PSYCH: The patient's mood and manner are appropriate. Grooming and personal hygiene are appropriate.  MEDICAL DECISION MAKING: Patient here with vomiting, extremity dry mucous membranes and tachycardia. He does appear very dehydrated on exam. Will give IV fluids. No history of CHF. He is also complaining of lower back pain and has had 3 prior back surgeries. He denies having any hardware. Will obtain an x-ray of his lumbar spine. His pain may be secondary to multiple episodes of vomiting cause straining on Monday. He is neurologically intact other than some weakness in bilateral hip flexion which he reports he feels is secondary to his generalized weakness and back pain. We'll obtain labs to evaluate for anemia, electrolyte abnormality, urine to evaluate for infection. We'll check a CK given he was down for almost 24 hours.  ED PROGRESS: Pt's CK is greater than 12,000. His creatinine is normal at 1.0 and his bicarbonate is 23. We'll continue IV hydration. His heart rate is still in the 150s despite 2 L of IV fluids. He still appears to be in atrial fibrillation. Still denying any chest pain or shortness of breath. No prior history of known A. fib. Will start on diltiazem drip.   6:33 PM  HR still in 150s on 10mg /hr, will increase.  Continuing IV hydration. Urine shows large hemoglobin only 3-6 RBCs, likely for myoglobin. He does have many bacteria but no other sign of infection. Culture pending. He has had multiple X. of diarrhea. C. difficile and stool cultures pending. Chest x-ray clear. Lumbar x-ray shows multilevel disc degeneration but no acute abnormality. Patient reports he is feeling better after IV fluids. We'll discuss with hospitalist for admission for continued IV hydration for  rhabdomyolysis and diltiazem drip for A. fib with RVR.    6:58 PM  D/w Dr. Ernestina Patches with hospitalist for admission.  He is requesting CT AP given pt's leukocytosis, elevated LFTs, nausea, vomiting or diarrhea.   8:22 PM  Pt has no improvement in heart rate with diltiazem. Discussed with cardiology fellow to see the patient in the emergency department.   EKG Interpretation  Date/Time:  Thursday April 01 2014 15:14:32 EDT Ventricular Rate:  149 PR Interval:    QRS Duration: 104 QT Interval:  274 QTC Calculation: 431 R Axis:   -102 Text Interpretation:  Atrial fibrillation Inferior infarct, old Anterior infarct, old Confirmed by WARD,  DO, KRISTEN ([id number]) on 04/01/2014 3:22:05 PM         EKG Interpretation  Date/Time:  Thursday April 01 2014 16:43:18 EDT Ventricular Rate:  143 PR Interval:    QRS Duration: 101 QT Interval:  285 QTC Calculation: 439 R Axis:   -99 Text Interpretation:  Atrial fibrillation Inferior infarct, old Probable anteroseptal infarct, recent Confirmed by WARD,  DO, KRISTEN ([id number]) on 04/01/2014 4:51:33 PM         CRITICAL CARE Performed by: Nyra Jabs   Total critical care time: 45 minutes  Critical care time was exclusive of separately billable procedures and treating other patients.  Critical  care was necessary to treat or prevent imminent or life-threatening deterioration.  Critical care was time spent personally by me on the following activities: development of treatment plan with patient and/or surrogate as well as nursing, discussions with consultants, evaluation of patient's response to treatment, examination of patient, obtaining history from patient or surrogate, ordering and performing treatments and interventions, ordering and review of laboratory studies, ordering and review of radiographic studies, pulse oximetry and re-evaluation of patient's condition.   [place], DO 04/01/14 [place], DO 04/01/14  2023

## 2014-04-01 NOTE — ED Notes (Signed)
Dr. Ward at bedside.

## 2014-04-01 NOTE — ED Notes (Signed)
Pt in c/o lower back pain since Monday, states he was vomiting on Monday but that has resolved, pt states he was trying to get to bed last night and slipped off the edge and slid on the floor, pt states he couldn't get up and he laid there for 20 hours, a friend came and found him this morning, pt alert and oriented, continues to c/o back pain

## 2014-04-01 NOTE — ED Notes (Signed)
Pollyann Kennedy  [phone]

## 2014-04-01 NOTE — ED Notes (Signed)
i-stat Trop. result given to Dr. Leonides Schanz

## 2014-04-01 NOTE — ED Notes (Signed)
Denies nausea

## 2014-04-02 ENCOUNTER — Encounter (HOSPITAL_COMMUNITY)
Admission: EM | Disposition: A | Discharge status: Skilled Nursing Facility | Payer: Self-pay | Source: Home / Self Care | Attending: Internal Medicine

## 2014-04-02 DIAGNOSIS — M545 Low back pain, unspecified: Secondary | ICD-10-CM

## 2014-04-02 DIAGNOSIS — I4891 Unspecified atrial fibrillation: Secondary | ICD-10-CM

## 2014-04-02 DIAGNOSIS — M6282 Rhabdomyolysis: Secondary | ICD-10-CM

## 2014-04-02 DIAGNOSIS — E860 Dehydration: Secondary | ICD-10-CM

## 2014-04-02 LAB — COMPREHENSIVE METABOLIC PANEL WITH GFR
ALT: 74 U/L — ABNORMAL HIGH (ref 0–53)
AST: 157 U/L — ABNORMAL HIGH (ref 0–37)
Albumin: 2.6 g/dL — ABNORMAL LOW (ref 3.5–5.2)
Alkaline Phosphatase: 68 U/L (ref 39–117)
Anion gap: 15 (ref 5–15)
BUN: 25 mg/dL — ABNORMAL HIGH (ref 6–23)
CO2: 20 meq/L (ref 19–32)
Calcium: 7.7 mg/dL — ABNORMAL LOW (ref 8.4–10.5)
Chloride: 97 meq/L (ref 96–112)
Creatinine, Ser: 0.72 mg/dL (ref 0.50–1.35)
GFR calc Af Amer: >90 mL/min
GFR calc non Af Amer: >90 mL/min
Glucose, Bld: 107 mg/dL — ABNORMAL HIGH (ref 70–99)
Potassium: 3.4 meq/L — ABNORMAL LOW (ref 3.7–5.3)
Sodium: 132 meq/L — ABNORMAL LOW (ref 137–147)
Total Bilirubin: 0.8 mg/dL (ref 0.3–1.2)
Total Protein: 6 g/dL (ref 6.0–8.3)

## 2014-04-02 LAB — C-REACTIVE PROTEIN: CRP: 30 mg/dL — ABNORMAL HIGH

## 2014-04-02 LAB — CBC WITH DIFFERENTIAL/PLATELET
Basophils Absolute: 0 10*3/uL (ref 0.0–0.1)
Basophils Relative: 0 % (ref 0–1)
Eosinophils Absolute: 0 10*3/uL (ref 0.0–0.7)
Eosinophils Relative: 0 % (ref 0–5)
HCT: 47.3 % (ref 39.0–52.0)
Hemoglobin: 16.9 g/dL (ref 13.0–17.0)
Lymphocytes Relative: 4 % — ABNORMAL LOW (ref 12–46)
Lymphs Abs: 0.6 10*3/uL — ABNORMAL LOW (ref 0.7–4.0)
MCH: 32.4 pg (ref 26.0–34.0)
MCHC: 35.7 g/dL (ref 30.0–36.0)
MCV: 90.8 fL (ref 78.0–100.0)
Monocytes Absolute: 1.9 10*3/uL — ABNORMAL HIGH (ref 0.1–1.0)
Monocytes Relative: 13 % — ABNORMAL HIGH (ref 3–12)
Neutro Abs: 11.8 10*3/uL — ABNORMAL HIGH (ref 1.7–7.7)
Neutrophils Relative %: 82 % — ABNORMAL HIGH (ref 43–77)
Platelets: 98 10*3/uL — ABNORMAL LOW (ref 150–400)
RBC: 5.21 MIL/uL (ref 4.22–5.81)
RDW: 14.3 % (ref 11.5–15.5)
WBC: 14.4 10*3/uL — ABNORMAL HIGH (ref 4.0–10.5)

## 2014-04-02 LAB — HEPATITIS PANEL, ACUTE
HCV Ab: NEGATIVE
Hep A IgM: NONREACTIVE
Hep B C IgM: NONREACTIVE
Hepatitis B Surface Ag: NEGATIVE

## 2014-04-02 LAB — TROPONIN I
Troponin I: <0.3 ng/mL (ref <0.30)
Troponin I: <0.3 ng/mL

## 2014-04-02 LAB — LACTIC ACID, PLASMA
Lactic Acid, Venous: 1.3 mmol/L (ref 0.5–2.2)
Lactic Acid, Venous: 1.3 mmol/L (ref 0.5–2.2)
Lactic Acid, Venous: 1.5 mmol/L (ref 0.5–2.2)

## 2014-04-02 LAB — PROTIME-INR
INR: 1.39 (ref 0.00–1.49)
PROTHROMBIN TIME: 17.1 s — AB (ref 11.6–15.2)

## 2014-04-02 LAB — CK
Total CK: 4522 U/L — ABNORMAL HIGH (ref 7–232)
Total CK: 2528 U/L — ABNORMAL HIGH (ref 7–232)
Total CK: 1550 U/L — ABNORMAL HIGH (ref 7–232)

## 2014-04-02 LAB — HEPARIN LEVEL (UNFRACTIONATED)

## 2014-04-02 LAB — MRSA PCR SCREENING: MRSA by PCR: NEGATIVE

## 2014-04-02 LAB — OCCULT BLOOD X 1 CARD TO LAB, STOOL: Fecal Occult Bld: POSITIVE — AB

## 2014-04-02 LAB — SAVE SMEAR

## 2014-04-02 LAB — CLOSTRIDIUM DIFFICILE BY PCR: Toxigenic C. Difficile by PCR: NEGATIVE

## 2014-04-02 SURGERY — ECHOCARDIOGRAM, TRANSESOPHAGEAL
Anesthesia: Monitor Anesthesia Care

## 2014-04-02 MED ORDER — CETYLPYRIDINIUM CHLORIDE 0.05 % MT LIQD
7.0000 mL | Freq: Two times a day (BID) | OROMUCOSAL | Status: DC
  Administered 2014-04-02 – 2014-04-15 (×26): 7 mL via OROMUCOSAL

## 2014-04-02 MED ORDER — APIXABAN 5 MG PO TABS
5.0000 mg | ORAL_TABLET | Freq: Two times a day (BID) | ORAL | Status: DC
  Administered 2014-04-02 – 2014-04-06 (×9): 5 mg via ORAL
  Filled 2014-04-02 (×10): qty 1

## 2014-04-02 MED ORDER — POTASSIUM CHLORIDE CRYS ER 20 MEQ PO TBCR
40.0000 meq | EXTENDED_RELEASE_TABLET | Freq: Once | ORAL | Status: AC
  Administered 2014-04-02: 40 meq via ORAL
  Filled 2014-04-02: qty 2

## 2014-04-02 MED ORDER — HEPARIN BOLUS VIA INFUSION
3000.0000 [IU] | Freq: Once | INTRAVENOUS | Status: AC
  Administered 2014-04-02: 3000 [IU] via INTRAVENOUS
  Filled 2014-04-02: qty 3000

## 2014-04-02 MED ORDER — METOPROLOL TARTRATE 1 MG/ML IV SOLN
2.5000 mg | Freq: Once | INTRAVENOUS | Status: AC
  Administered 2014-04-02: 2.5 mg via INTRAVENOUS
  Filled 2014-04-02: qty 5

## 2014-04-02 MED ORDER — MORPHINE SULFATE 2 MG/ML IJ SOLN
1.0000 mg | INTRAMUSCULAR | Status: DC | PRN
  Administered 2014-04-02: 1 mg via INTRAVENOUS
  Administered 2014-04-03: 2 mg via INTRAVENOUS
  Administered 2014-04-03 (×3): 1 mg via INTRAVENOUS
  Administered 2014-04-04 – 2014-04-15 (×14): 2 mg via INTRAVENOUS
  Filled 2014-04-02 (×20): qty 1

## 2014-04-02 MED ORDER — ONDANSETRON HCL 4 MG/2ML IJ SOLN
4.0000 mg | Freq: Four times a day (QID) | INTRAMUSCULAR | Status: DC | PRN
  Administered 2014-04-06 – 2014-04-11 (×3): 4 mg via INTRAVENOUS
  Filled 2014-04-02 (×3): qty 2

## 2014-04-02 MED ORDER — AMIODARONE HCL IN DEXTROSE 360-4.14 MG/200ML-% IV SOLN
60.0000 mg/h | INTRAVENOUS | Status: AC
  Administered 2014-04-02: 60 mg/h via INTRAVENOUS
  Filled 2014-04-02 (×2): qty 200

## 2014-04-02 MED ORDER — AMIODARONE LOAD VIA INFUSION
150.0000 mg | Freq: Once | INTRAVENOUS | Status: AC
  Administered 2014-04-02: 150 mg via INTRAVENOUS
  Filled 2014-04-02 (×2): qty 83.34

## 2014-04-02 MED ORDER — HEPARIN (PORCINE) IN NACL 100-0.45 UNIT/ML-% IJ SOLN
1400.0000 [IU]/h | INTRAMUSCULAR | Status: DC
  Administered 2014-04-02: 1400 [IU]/h via INTRAVENOUS
  Filled 2014-04-02 (×2): qty 250

## 2014-04-02 MED ORDER — PIPERACILLIN-TAZOBACTAM 3.375 G IVPB
3.3750 g | Freq: Three times a day (TID) | INTRAVENOUS | Status: DC
  Administered 2014-04-02 – 2014-04-04 (×8): 3.375 g via INTRAVENOUS
  Filled 2014-04-02 (×10): qty 50

## 2014-04-02 MED ORDER — MORPHINE SULFATE 2 MG/ML IJ SOLN
1.0000 mg | INTRAMUSCULAR | Status: DC | PRN
Start: 2014-04-02 — End: 2014-04-02
  Administered 2014-04-02 (×4): 1 mg via INTRAVENOUS
  Filled 2014-04-02 (×4): qty 1

## 2014-04-02 MED ORDER — AMIODARONE HCL IN DEXTROSE 360-4.14 MG/200ML-% IV SOLN
30.0000 mg/h | INTRAVENOUS | Status: DC
Start: 2014-04-02 — End: 2014-04-03
  Administered 2014-04-02 – 2014-04-03 (×2): 30 mg/h via INTRAVENOUS
  Filled 2014-04-02 (×4): qty 200

## 2014-04-02 NOTE — Progress Notes (Addendum)
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 Fib with RVR. Pt had progressive nausea, vomiting and diarrhea over 2-3 days. Also developed new onset back pain. Was at home and slid down the bed. Was unable to get up from the floor for several hours because of weakness.   In the ER patient noted to be in A. Fib with heart rate into the 140s. Blood pressure in the 100s to 130s. Satting greater than 92% on room air. Notable labs include a white blood cell count 16.6, hemoglobin 19.2, platelets 96, sodium 131, creatinine 1.0, BUN 31, AST 254, ALT 89. Lactate 2.45. CK 12k. Trop 0.09, then <0.3. UA mildly indicative of infection. L. Spine x-ray noted general lumbar degeneration. Chest x-ray within normal limits.  HPI/Subjective: Pt is resting comfortably.  Feels "bad in general" but states he is feeling better than at time of admit.    Assessment/Plan:  Sepsis due to Pyelonephritis  Urine cx pending - sepsis physiology improving - cont broad spectrum abx and follow culture data  Bacteremia - 2/2 blood cx Cont broad spectrum abx - speciation pending - will not start Vanc as gram stain characteristics suggestive of strep   Lactic acidosis  Resolved w/ volume expansion  Newly diagnosed Afib w/ RVR  Per Cardiology - now on amio IV - heparin > eliquis  Rhabdomyolysis  Ck peak 12k - improving w/ hydration - follow   Guaiac positive  In setting of diarrhea, significance of this is not clear - follow Hgb   Transaminitis likely shock liver - recheck in AM w/ hydration   Back pain   Abdom pain - N/V - diarrhea  Due to pyelonephritis   Thrombocytopenia Follow    Morbid obesity - Body mass index is 40.93 kg/(m^2).  Code Status: FULL Family Communication: no family present at time of exam Disposition Plan: SDU  Consultants: Cardiology Neurology   Procedures: none  Antibiotics: Zosyn 9/17 >  DVT prophylaxis: IV heparin   Objective: Blood pressure 153/77, pulse 133, temperature 98.4 F (36.9 C), temperature source Oral, resp. rate 25, height 5\' 10"  (1.778 m), weight 129.4 kg (285 lb 4.4 oz), SpO2 95.00%.  Intake/Output Summary (Last 24 hours) at 04/02/14 1314 Last data filed at 04/02/14 1200  Gross per 24 hour  Intake 3587.5 ml  Output    500 ml  Net 3087.5 ml   Exam: General: No acute respiratory distress Lungs: Clear to auscultation bilaterally without wheezes or crackles Cardiovascular: irreg irreg and tachycardic - no appreciable M  Abdomen: Nontender, protuberent, soft, bowel sounds positive, no rebound, no ascites, no appreciable mass Extremities: No significant cyanosis, clubbing, or edema bilateral lower extremities  Data Reviewed: Basic Metabolic Panel:  Recent Labs Lab 04/01/14 1414 04/01/14 2018 04/02/14 0253  NA 131*  --  132*  K 3.9  --  3.4*  CL 90*  --  97  CO2 23  --  20  GLUCOSE 94  --  107*  BUN 31*  --  25*  CREATININE 1.00 0.80 0.72  CALCIUM 8.9  --  7.7*   Liver Function Tests:  Recent Labs Lab 04/01/14 1414 04/02/14 0253  AST 254* 157*  ALT 89* 74*  ALKPHOS  80 68  BILITOT 1.0 0.8  PROT 7.2 6.0  ALBUMIN 3.2* 2.6*   Coags:  Recent Labs Lab 04/02/14 0253  INR 1.39   No results found for this basename: PTT,  in the last 168 hours  CBC:  Recent Labs Lab 04/01/14 1414 04/01/14 2018 04/02/14 0253  WBC 16.6* 15.2* 14.4*  NEUTROABS 14.3*  --  11.8*  HGB 19.2* 18.3* 16.9  HCT 53.8* 50.7 47.3  MCV 93.7 91.2 90.8  PLT 96* 95* 98*    Cardiac Enzymes:  Recent Labs Lab 04/01/14 1504 04/01/14 1530 04/01/14 2018 04/02/14 0200 04/02/14 0752 04/02/14 0808  CKTOTAL 27253*   --  7232* 4522*  --  2528*  TROPONINI  --  <0.30 <0.30 <0.30 <0.30  --     Recent Results (from the past 240 hour(s))  CLOSTRIDIUM DIFFICILE BY PCR     Status: None   Collection Time    04/01/14  5:17 PM      Result Value Ref Range Status   C difficile by pcr NEGATIVE  NEGATIVE Final  MRSA PCR SCREENING     Status: None   Collection Time    04/01/14  9:42 PM      Result Value Ref Range Status   MRSA by PCR NEGATIVE  NEGATIVE Final   Comment:            The GeneXpert MRSA Assay (FDA     approved for NASAL specimens     only), is one component of a     comprehensive MRSA colonization     surveillance program. It is not     intended to diagnose MRSA     infection nor to guide or     monitor treatment for     MRSA infections.     Studies:  Recent x-ray studies have been reviewed in detail by the Attending Physician  Scheduled Meds:  Scheduled Meds: . amiodarone  150 mg Intravenous Once  . antiseptic oral rinse  7 mL Mouth Rinse BID  . apixaban  5 mg Oral BID  . ondansetron  4 mg Intravenous Once  . pantoprazole (PROTONIX) IV  40 mg Intravenous Q12H  . piperacillin-tazobactam (ZOSYN)  IV  3.375 g Intravenous 3 times per day  . sodium chloride  3 mL Intravenous Q12H    Time spent on care of this patient: 35 mins   Oliviah Agostini T , MD   [facility] Office  [phone] Pager - Text Page per Shea Evans as per below:  On-Call/Text Page:      [email or web address]      password [email or web address]  If 7PM-7AM, please contact night-coverage [email or web address] Password [email or web address] 04/02/2014, 1:14 PM   LOS: 1 day

## 2014-04-02 NOTE — Consult Note (Signed)
Neurology Consultation Reason for Consult: Generalized weakness, neck pain Referring Physician: Romona Curls.  CC: Generalized weakness  History is obtained from: Patient  HPI: [name] is a 73 y.o. male began having issues several days ago with nausea and vomiting progressing to generalized weakness. He became so weak that he was unable to get up off the floor and laid there for approximately 20 hours. He then presented today with multiple issues including sepsis, dehydration, back pain, new onset atrial fibrillation. He states that several days ago he began having back pain. This is progressively been getting worse.  After laying on the floor for 20 hours, he states that he thinks he pulled his neck and has significant neck and shoulder pain.  He denies any headache.   ROS: A 14 point ROS was performed and is negative except as noted in the HPI.   Past Medical History  Diagnosis Date  . Acute appendicitis with peritoneal abscess 01/28/2013  . Ileus, postoperative 01/28/2013  . Tobacco use disorder 01/28/2013  . Obesity, morbid 01/28/2013  . Shortness of breath     Family History: No history of similar  Social History: Tob: Smoker  Exam: Current vital signs: BP 116/78  Pulse 142  Temp(Src) 98.7 F (37.1 C) (Oral)  Resp 20  Ht 5\' 10"  (1.778 m)  Wt 129.4 kg (285 lb 4.4 oz)  BMI 40.93 kg/m2  SpO2 92% Vital signs in last 24 hours: Temp:  [98.1 F (36.7 C)-100 F (37.8 C)] 98.7 F (37.1 C) (09/17 2352) Pulse Rate:  [48-148] 142 (09/17 2352) Resp:  [16-27] 20 (09/17 2352) BP: (105-138)/(56-97) 116/78 mmHg (09/17 2352) SpO2:  [92 %-99 %] 92 % (09/17 2352) Weight:  [129.4 kg (285 lb 4.4 oz)] 129.4 kg (285 lb 4.4 oz) (09/17 2315)  General: In bed, NAD CV: Regular in rhythm Mental Status: Patient is awake, alert, oriented to person, place, month, year, and situation. Immediate and remote memory are intact. Patient is able to give a clear and coherent history. No  signs of aphasia or neglect Cranial Nerves: II: Visual Fields are full. Pupils are equal, round, and reactive to light.  Discs are difficult to visualize. III,IV, VI: EOMI without ptosis or diploplia.  V: Facial sensation is symmetric to temperature VII: Facial movement is symmetric.  VIII: hearing is intact to voice X: Uvula elevates symmetrically XI: Shoulder shrug is symmetric. XII: tongue is midline without atrophy or fasciculations.  Motor: Tone is normal. Bulk is normal. 5/5 strength was present in all four extremities. He is unable lift his right arm do to shoulder pain. Sensory: Sensation is symmetric to light touch and temperature in the arms and legs. Deep Tendon Reflexes: 2+ and symmetric in the biceps and patellae.  Plantars: Toes are downgoing bilaterally.  Cerebellar: Unable to perform finger-nose-finger or heel-knee-shin due to pain. Gait: Not tested due to pain  On palpation of his back he has bilateral flank pain. The exam is somewhat limited as the patient refuses to roll to his side to allow a better exam, however it seems that he has much more flank pain then paraspinal pain. I am able to flex his neck passively bring his chin almost to his chest before he expresses pain, however with any rotation he experiences significant pain.   I have reviewed labs in epic and the results pertinent to this consultation are: UA-suspicious for infectious process  I have reviewed the imaging : CT abdomen/pelvis-perinephric fat stranding suspicious for pyelonephritis  Impression: 73 year old  male with generalized weakness in the setting of sepsis and atrial fibrillation with RVR. I suspect that his neck pain given that involves his shoulder as much as it does as well as the fact that I could passively flex his neck is likely musculoskeletal in nature. Likely related to his prolonged downtime. If he were to have fevers of unclear cause, develop severe headaches for more have change  in mental status, then I would cover him empirically for meningitis and proceed with lumbar puncture at that time, but barring these events I would favor holding off for now as I think the likelihood of this representing meningitis is low.  Viral myositis is also possible, but I suspect his CK is mostly related to his prolonged downtime.  Recommendations: 1) If he were to have fevers of unclear cause, develop severe headaches for more have change in mental status, then I would cover him empirically for meningitis and proceed with lumbar puncture at that time, but barring these events I would favor holding  off for now as I think the likelihood of this representing meningitis is low. 2) treatment of suspected sepsis/A. fib with RVR per internal medicine    Roland Rack, MD [facility] [phone]  If 7pm- 7am, please page neurology on call as listed in [place].

## 2014-04-02 NOTE — Evaluation (Signed)
Noted perinephric stranding on CT scan concerning for pyelo  Will transition pt to zosyn for monotherapy  Continue to follow closely overnight

## 2014-04-02 NOTE — Progress Notes (Signed)
ANTICOAGULATION CONSULT NOTE - Initial Consult  Pharmacy Consult for Heparin  Indication: atrial fibrillation, new onset  Pharmacy Consult for Zosyn Indication: r/o sepsis, possible pyelonephritis   No Known Allergies  Patient Measurements: Height: 5\' 10"  (177.8 cm) Weight: 285 lb 4.4 oz (129.4 kg) IBW/kg (Calculated) : 73 Heparin Dosing Weight: ~102kg  Vital Signs: Temp: 98.7 F (37.1 C) (09/17 2352) Temp src: Oral (09/17 2352) BP: 116/78 mmHg (09/17 2352) Pulse Rate: 142 (09/17 2352)  Labs:  Recent Labs  04/01/14 1414 04/01/14 1504 04/01/14 1530 04/01/14 2018  HGB 19.2*  --   --  18.3*  HCT 53.8*  --   --  50.7  PLT 96*  --   --  95*  CREATININE 1.00  --   --  0.80  CKTOTAL  --  12157*  --  7232*  TROPONINI  --   --  <0.30 <0.30    Estimated Creatinine Clearance: 116.2 ml/min (by C-G formula based on Cr of 0.8).   Medical History: Past Medical History  Diagnosis Date  . Acute appendicitis with peritoneal abscess 01/28/2013  . Ileus, postoperative 01/28/2013  . Tobacco use disorder 01/28/2013  . Obesity, morbid 01/28/2013  . Shortness of breath     Assessment: 73 y/o M to start heparin for new onset afib. CBC good, renal function good, plts are low and already received a subcutaneous heparin injection so will give a reduced bolus. Other labs as above.   Also starting Zosyn for r/o sepsis with likely kidney source. Leukocytosis present, renal function appropriate for age.   Goal of Therapy:  Heparin level 0.3-0.7 units/ml Monitor platelets by anticoagulation protocol: Yes   Plan:  -Heparin 3000 units BOLUS -Start heparin drip at 1400 units/hr -1000 HL -Daily CBC/HL -Monitor for bleeding  -Zosyn 3.375G IV q8h to be infused over 4 hours -Trend WBC, temp, renal function  -F/U cultures  Narda Bonds 04/02/2014,1:42 AM

## 2014-04-02 NOTE — Progress Notes (Addendum)
Report received from [facility] in the ED. Pt received to the unit 2c with complaints of nausea, neck and back pain. Pt is extremely stiff and unable to turn neck. 2 fluid bolus and Cardizem of 39ml/hr running in patient when transferred. Sats in the low 90s but pt is not SOB.   Samiah Ricklefs M. Dalbert Batman, RN, BSN 04/02/2014 1:29 AM

## 2014-04-02 NOTE — Progress Notes (Signed)
Utilization Review Completed.  

## 2014-04-02 NOTE — Progress Notes (Signed)
Paged Rogue Bussing NP regarding pts pain, heart rate in the 150s. Rogue Bussing placed orders for pain medications for pt and stated that Cardizem drip was sufficient for pt currently.

## 2014-04-02 NOTE — Progress Notes (Signed)
Dr Clayborne Artist made aware of pts heart rate in the high 140 after Metoprolol 2.5mg  x 2 IV and Cardizem drip @15 . MD okay with heart rate, waiting to see what morning rounds say about possible TEE and cardiovert for afib.   Brisha Mccabe M. Dalbert Batman, RN, BSN 04/02/2014 5:04 AM

## 2014-04-02 NOTE — Progress Notes (Signed)
Paged Dr Ernestina Patches related to unclear orders regarding Cardizem drip. Dr Ernestina Patches stated to continue Cardizem drip that was started in the ED and give IV Metoprolol 2.5mg  now and repeat if heart rate does not change. Pt currently in Afib w/RVR in the 150s. Orders read back to MD and confirmed.

## 2014-04-02 NOTE — Progress Notes (Signed)
RN checked with pharmacy about running cardizam and amiodarone drips together per order. Pt at 7pm is still in AFib . No chest pain. C/o shoulder and neck pain controlled with morphine every 4 hours.

## 2014-04-02 NOTE — Progress Notes (Signed)
Subjective: Generalized pain and weakness.  Neck and back hurts as well.   Objective: Vital signs in last 24 hours: Temp:  [98.1 F (36.7 C)-100.2 F (37.9 C)] 100.2 F (37.9 C) (09/18 0820) Pulse Rate:  [51-148] 128 (09/18 0820) Resp:  [16-27] 24 (09/18 0820) BP: (105-138)/(56-97) 116/83 mmHg (09/18 0820) SpO2:  [92 %-99 %] 93 % (09/18 0820) Weight:  [285 lb 4.4 oz (129.4 kg)] 285 lb 4.4 oz (129.4 kg) (09/17 2315) Last BM Date: 04/01/14  Intake/Output from previous day: 09/17 0701 - 09/18 0700 In: 2050 [I.V.:2000; IV Piggyback:50] Out: 300 [Urine:300] Intake/Output this shift: Total I/O In: 1087.5 [I.V.:1087.5] Out: -   Medications Current Facility-Administered Medications  Medication Dose Route Frequency Provider Last Rate Last Dose  . 0.9 %  sodium chloride infusion   Intravenous Continuous Shanda Howells, MD 150 mL/hr at 04/02/14 0145    . antiseptic oral rinse (CPC / CETYLPYRIDINIUM CHLORIDE 0.05%) solution 7 mL  7 mL Mouth Rinse BID Shanda Howells, MD      . diltiazem (CARDIZEM) 100 mg in dextrose 5 % 100 mL (1 mg/mL) infusion  5-15 mg/hr Intravenous Titrated Shanda Howells, MD 15 mL/hr at 04/02/14 0900 15 mg/hr at 04/02/14 0900  . heparin ADULT infusion 100 units/mL (25000 units/250 mL)  1,400 Units/hr Intravenous Continuous Narda Bonds, RPH 14 mL/hr at 04/02/14 0900 1,400 Units/hr at 04/02/14 0900  . morphine 2 MG/ML injection 1 mg  1 mg Intravenous Q4H PRN Dianne Dun, NP   1 mg at 04/02/14 0526  . ondansetron (ZOFRAN) injection 4 mg  4 mg Intravenous Once Kristen N Ward, DO      . pantoprazole (PROTONIX) injection 40 mg  40 mg Intravenous Q12H Shanda Howells, MD   40 mg at 04/02/14 0250  . piperacillin-tazobactam (ZOSYN) IVPB 3.375 g  3.375 g Intravenous 3 times per day Narda Bonds, RPH   3.375 g at 04/02/14 0250  . sodium chloride 0.9 % injection 3 mL  3 mL Intravenous Q12H Shanda Howells, MD        PE: General appearance: alert, cooperative and  mild distress Lungs: clear to auscultation bilaterally Heart: irregularly irregular rhythm and No MM rate fast.  Abdomen: +BS, Nontender Extremities: 1+ right LEE Pulses: radials 2+, left PT 2+.  right DP/PT 0.  LE very warm- R>L.  mild erhythema in the right LE.  Skin: warm and dry Neurologic: Grossly normal  Lab Results:   Recent Labs  04/01/14 1414 04/01/14 2018 04/02/14 0253  WBC 16.6* 15.2* 14.4*  HGB 19.2* 18.3* 16.9  HCT 53.8* 50.7 47.3  PLT 96* 95* 98*   BMET  Recent Labs  04/01/14 1414 04/01/14 2018 04/02/14 0253  NA 131*  --  132*  K 3.9  --  3.4*  CL 90*  --  97  CO2 23  --  20  GLUCOSE 94  --  107*  BUN 31*  --  25*  CREATININE 1.00 0.80 0.72  CALCIUM 8.9  --  7.7*   PT/INR  Recent Labs  04/02/14 0253  LABPROT 17.1*  INR 1.39    Assessment/Plan 82M with no significant medical history (2/2 not seeing physicians) who presented to the ED in the setting of N/V/D and back pain x 1 week. The ptient reports he was on the edge of his bed when he got weak and slid off to the floor He was not able to get up so he stayed there for 20hours before he was found  by his friend.  EKG revealed afib in the 140's.    Active Problems:   Dehydration   Leukocytosis   Atrial fibrillation with rapid ventricular response   Abdominal pain, generalized   Nausea vomiting and diarrhea   Back pain   Transaminitis   Sepsis   Thrombocytopenia, unspecified   Rhabdomyolysis   Obesity    Tobacco abuse-75PY   Plan:  The patient continues in rapid Afib on Cardizem 15mg /hr. 2.5mg  IV lopressor given at 0500 hours x 2 with little response.  Infection could be driving some of his HR.   Echo pending but he needs to be slowed down first.  Checking to see if we can do a tee/dccv today.  May need amio instead.  He's been NPO.  He is occult blood positive.  On IV heparin for now.  Continue to follow Hgb.  Troponin negative.  CK trending down.  Will need ischemic eval with Nuc stress test  unless there is a wall motion abnormality or significant EF deficit.  Could developed tachy mediated CM also.  Risk factors: obese smoker.     WBCs trending down.  On Zosyn  Blood, urine, stool cultures, O&P pending.    Neuro folowing   LOS: 1 day    HAGER, BRYAN PA-C 04/02/2014 9:42 AM  Personally seen and examined. Agree with above.  73 year old with rhabdo, metabolic deragements, AFIB RVR, morbid obesity.   AFIB  -I will start IV amiodarone   -Stopping Heparin IV and starting Eliquis 5 mg PO BID (watch Hg closely with Heme + stool).   -Hopefully amio will slow and possibly convert.   -If continues over weekend, consider TEE/CV Monday if needed.   -Would not urgently cardiovert now as good chance of not holding in current state.   Candee Furbish, MD

## 2014-04-02 NOTE — Consult Note (Cosign Needed)
CARDIOLOGY CONSULT NOTE   Patient ID: [name] MRN: [id number], DOB/AGE: Jan 16, 1941   Admit date: 04/01/2014 Date of Consult: 04/02/2014   Primary Physician: [name], MD Primary Cardiologist: None  Reason for consult: New onset Atrial fibrillation  Problem List  Past Medical History  Diagnosis Date  . Acute appendicitis with peritoneal abscess 01/28/2013  . Ileus, postoperative 01/28/2013  . Tobacco use disorder 01/28/2013  . Obesity, morbid 01/28/2013  . Shortness of breath     Past Surgical History  Procedure Laterality Date  . Joint replacement      bilateral knee surgery  . Back surgery    . Laparoscopic appendectomy N/A 01/25/2013    Procedure: APPENDECTOMY LAPAROSCOPIC;  Surgeon: Ralene Ok, MD;  Location: [facility];  Service: General;  Laterality: N/A;     Allergies  No Known Allergies  HPI   The patient is a 20M with no significant medical history (2/2 not seeing physicians) who presented to the ED in the setting of N/V/D and back pain x 1 week. He sustained what appeared to be a mechanical fall and was not able to get up so he stayed there for several hours until found. He denied any chest pain or shortness of breath throughout this entire episode. In the ED, his HR was consistently in the 140s in AF. His troponin was negative x 1. He was found to have rhabdomyolysis with a CK >12,000. He was admitted to the hospitalist service for management of his N/V/D and transaminitis. We are consulted for evaluation and management of his AF. Int he ED he was started on diltiazem gtt with minimal change to his BP.  Of note, the patient reports that he is an avid coffee drinker and drinks at least 2 large pots daily. He denies known sleep apnea or the use of any other stimulants.  He denies CHF, HTN, stroke history, or history of vascular disease.   Inpatient Medications  . metoprolol  2.5 mg Intravenous Once  . ondansetron  4 mg Intravenous Once  .  pantoprazole (PROTONIX) IV  40 mg Intravenous Q12H  . sodium chloride  3 mL Intravenous Q12H    Family History History reviewed. No pertinent family history.   Social History History   Social History  . Marital Status: Single    Spouse Name: N/A    Number of Children: N/A  . Years of Education: N/A   Occupational History  . Not on file.   Social History Main Topics  . Smoking status: Current Every Day Smoker -- 1.50 packs/day for 50 years    Types: Cigarettes  . Smokeless tobacco: Not on file  . Alcohol Use: No  . Drug Use: No  . Sexual Activity: Not Currently   Other Topics Concern  . Not on file   Social History Narrative  . No narrative on file     Review of Systems  General:  No chills, fever, night sweats or weight changes. + subjective weakness Cardiovascular:  No chest pain, dyspnea on exertion, edema, orthopnea, palpitations, paroxysmal nocturnal dyspnea. Dermatological: No rash, lesions/masses Respiratory: No cough, dyspnea Urologic: No hematuria, dysuria Abdominal:   No nausea, vomiting, diarrhea, bright red blood per rectum, melena, or hematemesis Neurologic:  No visual changes, wkns, changes in mental status. All other systems reviewed and are otherwise negative except as noted above.  Physical Exam  Blood pressure 116/78, pulse 142, temperature 98.7 F (37.1 C), temperature source Oral, resp. rate 20, height 5\' 10"  (1.778  m), weight 285 lb 4.4 oz (129.4 kg), SpO2 92.00%.  General: Pleasant, NAD Psych: Normal affect. Neuro: Alert and oriented X 3. Moves all extremities spontaneously. HEENT: Normal  Neck: Supple without bruits or JVD. Lungs:  Resp regular and unlabored, CTA. Heart: tachycardic, irregular, no s3, s4, or murmurs. Abdomen: Soft, non-tender, non-distended, BS + x 4.  Extremities: No clubbing, cyanosis or edema. DP/PT/Radials 2+ and equal bilaterally.  Labs   Recent Labs  04/01/14 1504 04/01/14 1530 04/01/14 2018  CKTOTAL  66063*  --  7232*  TROPONINI  --  <0.30 <0.30   Lab Results  Component Value Date   WBC 15.2* 04/01/2014   HGB 18.3* 04/01/2014   HCT 50.7 04/01/2014   MCV 91.2 04/01/2014   PLT 95* 04/01/2014    Recent Labs Lab 04/01/14 1414 04/01/14 2018  NA 131*  --   K 3.9  --   CL 90*  --   CO2 23  --   BUN 31*  --   CREATININE 1.00 0.80  CALCIUM 8.9  --   PROT 7.2  --   BILITOT 1.0  --   ALKPHOS 80  --   ALT 89*  --   AST 254*  --   GLUCOSE 94  --    No results found for this basename: CHOL, HDL, LDLCALC, TRIG   No results found for this basename: DDIMER    Radiology/Studies  Dg Chest 2 View  04/01/2014   CLINICAL DATA:  Shortness of breath.  Cough.  EXAM: CHEST  2 VIEW  COMPARISON:  CT scan dated 08/23/2005  FINDINGS: Heart size and pulmonary vascularity are normal. Lungs are clear. No acute osseous abnormality. Prominent right first costochondral junction.  IMPRESSION: No active cardiopulmonary disease.   Electronically Signed   By: Rozetta Nunnery M.D.   On: 04/01/2014 17:25   Ct Abdomen Pelvis W Contrast  04/01/2014   CLINICAL DATA:  Lower back pain and generalized weakness. Vomiting and anorexia.  EXAM: CT ABDOMEN AND PELVIS WITH CONTRAST  TECHNIQUE: Multidetector CT imaging of the abdomen and pelvis was performed using the standard protocol following bolus administration of intravenous contrast.  CONTRAST:  167mL OMNIPAQUE IOHEXOL 300 MG/ML  SOLN  COMPARISON:  CT of the abdomen and pelvis from 01/25/2013  FINDINGS: Minimal bibasilar atelectasis is noted. Scattered coronary artery calcifications are seen.  A stable 2.6 cm hypodensity within the left hepatic lobe likely reflects a cyst. The liver and spleen are otherwise unremarkable. The gallbladder is borderline normal in size, and grossly unremarkable in appearance. The pancreas and adrenal glands are unremarkable.  Diffuse perinephric stranding is noted. As this is new from the prior study, it raises concern for mild diffuse bilateral  pyelonephritis.  A 9 mm cyst is noted at the upper pole of the right kidney. Scattered vascular calcifications are noted at the right renal hilum. The kidneys are otherwise unremarkable. There is no evidence of hydronephrosis. No renal or ureteral stones are identified.  Trace free fluid is seen tracking along the mid to distal left ureter, at the left lower quadrant.  The small bowel is unremarkable in appearance. The stomach is within normal limits. No acute vascular abnormalities are seen.  Indication is status post appendectomy. Contrast progresses to the level of the sigmoid colon. The colon is unremarkable in appearance.  The bladder is mildly distended and grossly unremarkable. The prostate is mildly enlarged, measuring 4.9 cm in transverse dimension. No inguinal lymphadenopathy is seen.  No acute osseous abnormalities  are identified. Multilevel vacuum phenomenon, endplate sclerotic change and disc space narrowing are noted along the lumbar spine.  IMPRESSION: 1. Diffuse perinephric stranding, new from the prior study. This raises concern for mild diffuse bilateral pyelonephritis. Would correlate for associated symptoms. 2. Small right renal cyst and likely hepatic cyst. 3. Scattered coronary artery calcifications seen. 4. Mildly enlarged prostate. 5. Mild degenerative change noted along the lumbar spine.   Electronically Signed   By: Garald Balding M.D.   On: 04/01/2014 21:50    ECG  Atrial fibrillation with rapid ventricular response with rates in the 140s.  ASSESSMENT AND PLAN 64M presented with N/V/D and dehydration, found to have rhabdomyolysis and new onset AF  1. New onset AF: Etiology for his AF is unclear. I suspect it is secondary to his metabolic derangements, but other possible triggers may include undiagnosed OSA or excessive caffeine intake. The duration of his AF also is unclear given that he is currently asymptomatic without palpitations or CHF symptoms. He is tolerating fast HR very  well so far and would favor initial conservative strategy until his metabolic derangements are resolved. If he continues to have AF after that, would favor more aggressive strategy including the possibility of TEE-guided DCCV for one change at rhythm control. Would not cardiovert unless necessary urgently or emergently given risk of stroke. -please check TTE -start heparin with goal PTT 60-80 -counseled against excessive caffeine intake. -Can increase diltiazem gtt to 20mg /hr -If he becomes hypotensive would start amiodarone infusion at 1mg /min x 6hr, then 0.5mg /min x 18 hours, then oral load. -once his metabolic abnormalities are corrected, can consider rhythm control strategy including TEE-DCCV. -He is CHADS-VASC = 1 (age) but i suspect this is an underestimate.  Signed, Raliegh Ip, MD MPH 04/02/2014, 1:19 AM

## 2014-04-03 DIAGNOSIS — I517 Cardiomegaly: Secondary | ICD-10-CM

## 2014-04-03 LAB — COMPREHENSIVE METABOLIC PANEL
ALBUMIN: 2.3 g/dL — AB (ref 3.5–5.2)
ALT: 61 U/L — AB (ref 0–53)
ANION GAP: 13 (ref 5–15)
AST: 90 U/L — ABNORMAL HIGH (ref 0–37)
Alkaline Phosphatase: 83 U/L (ref 39–117)
BUN: 23 mg/dL (ref 6–23)
CALCIUM: 7.7 mg/dL — AB (ref 8.4–10.5)
CO2: 20 mEq/L (ref 19–32)
Chloride: 99 mEq/L (ref 96–112)
Creatinine, Ser: 0.76 mg/dL (ref 0.50–1.35)
GFR calc Af Amer: >90 mL/min (ref >90)
GFR calc non Af Amer: >90 mL/min (ref >90)
Glucose, Bld: 137 mg/dL — ABNORMAL HIGH (ref 70–99)
Potassium: 3.7 mEq/L (ref 3.7–5.3)
SODIUM: 132 meq/L — AB (ref 137–147)
TOTAL PROTEIN: 5.9 g/dL — AB (ref 6.0–8.3)
Total Bilirubin: 1.1 mg/dL (ref 0.3–1.2)

## 2014-04-03 LAB — IRON AND TIBC
IRON: 37 ug/dL — AB (ref 42–135)
SATURATION RATIOS: 26 % (ref 20–55)
TIBC: 141 ug/dL — AB (ref 215–435)
UIBC: 104 ug/dL — ABNORMAL LOW (ref 125–400)

## 2014-04-03 LAB — CBC
HEMATOCRIT: 46.6 % (ref 39.0–52.0)
HEMOGLOBIN: 16.9 g/dL (ref 13.0–17.0)
MCH: 32.5 pg (ref 26.0–34.0)
MCHC: 36.3 g/dL — AB (ref 30.0–36.0)
MCV: 89.6 fL (ref 78.0–100.0)
Platelets: 86 10*3/uL — ABNORMAL LOW (ref 150–400)
RBC: 5.2 MIL/uL (ref 4.22–5.81)
RDW: 14.3 % (ref 11.5–15.5)
WBC: 16.5 10*3/uL — ABNORMAL HIGH (ref 4.0–10.5)

## 2014-04-03 LAB — URINE CULTURE: Colony Count: 1000

## 2014-04-03 LAB — VITAMIN B12: VITAMIN B 12: 755 pg/mL (ref 211–911)

## 2014-04-03 LAB — CK: Total CK: 1045 U/L — ABNORMAL HIGH (ref 7–232)

## 2014-04-03 LAB — FERRITIN: Ferritin: 1471 ng/mL — ABNORMAL HIGH (ref 22–322)

## 2014-04-03 LAB — FECAL LACTOFERRIN, QUANT: Fecal Lactoferrin: POSITIVE

## 2014-04-03 LAB — RETICULOCYTES
RBC.: 5.21 MIL/uL (ref 4.22–5.81)
RETIC CT PCT: 0.5 % (ref 0.4–3.1)
Retic Count, Absolute: 26.1 10*3/uL (ref 19.0–186.0)

## 2014-04-03 LAB — FOLATE: Folate: 12.9 ng/mL

## 2014-04-03 LAB — MAGNESIUM: Magnesium: 2.2 mg/dL (ref 1.5–2.5)

## 2014-04-03 LAB — PHOSPHORUS: Phosphorus: 1.6 mg/dL — ABNORMAL LOW (ref 2.3–4.6)

## 2014-04-03 MED ORDER — DILTIAZEM HCL 25 MG/5ML IV SOLN
5.0000 mg | Freq: Once | INTRAVENOUS | Status: AC
  Administered 2014-04-03: 5 mg via INTRAVENOUS
  Filled 2014-04-03: qty 5

## 2014-04-03 MED ORDER — DILTIAZEM HCL 60 MG PO TABS
60.0000 mg | ORAL_TABLET | Freq: Four times a day (QID) | ORAL | Status: DC
  Administered 2014-04-03 – 2014-04-04 (×4): 60 mg via ORAL
  Filled 2014-04-03 (×8): qty 1

## 2014-04-03 MED ORDER — AMIODARONE HCL 200 MG PO TABS
400.0000 mg | ORAL_TABLET | Freq: Two times a day (BID) | ORAL | Status: DC
  Administered 2014-04-03 – 2014-04-04 (×3): 400 mg via ORAL
  Filled 2014-04-03 (×4): qty 2

## 2014-04-03 MED ORDER — DILTIAZEM HCL 25 MG/5ML IV SOLN
10.0000 mg | Freq: Once | INTRAVENOUS | Status: AC
  Administered 2014-04-03: 10 mg via INTRAVENOUS
  Filled 2014-04-03: qty 5

## 2014-04-03 NOTE — Progress Notes (Signed)
eLink Physician-Brief Progress Note Patient Name: [name] DOB: 01-16-1941 MRN: [id number]   Date of Service  04/03/2014  HPI/Events of Note   Afib with RVR noted. Present since ~ 1PM. HD stable and no symptoms. Converted to po Dilt today.   eICU Interventions   Trial of additional 5 mg IV dilt.       Intervention Category Intermediate Interventions: Arrhythmia - evaluation and management  Torren Maffeo R. 04/03/2014, 3:13 PM

## 2014-04-03 NOTE — Progress Notes (Signed)
  Echocardiogram 2D Echocardiogram has been performed.  [name] 04/03/2014, 1:18 PM

## 2014-04-03 NOTE — Progress Notes (Signed)
eLink Physician-Brief Progress Note Patient Name: [name] DOB: 1941/03/26 MRN: [id number]   Date of Service  04/03/2014  HPI/Events of Note   Remains in Afib with RVR   eICU Interventions   Trial of 10 mg of dilt. If not successful will restart drip.      Intervention Category Intermediate Interventions: Arrhythmia - evaluation and management  Lorrine Killilea R. 04/03/2014, 4:59 PM

## 2014-04-03 NOTE — Progress Notes (Signed)
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 Fib with RVR. Pt had progressive nausea, vomiting and diarrhea over 2-3 days. Also developed new onset back pain. Was at home and slid down the bed. Was unable to get up from the floor for several hours because of weakness.   In the ER patient noted to be in A. Fib with heart rate into the 140s. Blood pressure in the 100s to 130s. Satting greater than 92% on room air. Notable labs include a white blood cell count 16.6, hemoglobin 19.2, platelets 96, sodium 131, creatinine 1.0, BUN 31, AST 254, ALT 89. Lactate 2.45. CK 12k. Trop 0.09, then <0.3. UA mildly indicative of infection. L. Spine x-ray noted general lumbar degeneration. Chest x-ray within normal limits.  HPI/Subjective: Feeling "much better" today.  C/o aching "all over."  Denies cp, n/v, sob, or abdom pain.    Assessment/Plan:  Sepsis due to Goup B Strep - source unclear Urine cx not convincing - UA equivocal - sepsis physiology improving - cont broad spectrum abx and follow culture data  Group B Strep Bacteremia - 2/2 blood cx Cont broad spectrum abx - sensitivites pending  Lactic acidosis  Resolved w/ volume expansion  Newly diagnosed Afib w/ RVR  Per Cardiology - meds converted to oral today - heparin > eliquis  Rhabdomyolysis  Ck peak 12k - improving w/ hydration - follow   Guaiac positive  In setting of diarrhea, significance of this is not clear - follow Hgb   Transaminitis likely shock liver - improving w/ hydration   Back pain  Will have to consider if MRI of spine indicated  Abdom pain - N/V - diarrhea   resolved  Thrombocytopenia Follow   Morbid obesity - Body mass index is 30.18 kg/(m^2).  Code Status: FULL Family Communication: no family present at time of exam Disposition Plan: SDU  Consultants: Cardiology Neurology   Procedures: none  Antibiotics: Zosyn 9/17 >  DVT prophylaxis: eliquis  Objective: Blood pressure 143/75, pulse 103, temperature 100.5 F (38.1 C), temperature source Oral, resp. rate 27, height 5\' 10"  (1.778 m), weight 95.4 kg (210 lb 5.1 oz), SpO2 94.00%.  Intake/Output Summary (Last 24 hours) at 04/03/14 1220 Last data filed at 04/03/14 0434  Gross per 24 hour  Intake    940 ml  Output   2000 ml  Net  -1060 ml   Exam: General: No acute respiratory distress Lungs: Clear to auscultation bilaterally without wheezes or crackles Cardiovascular: RRR w/o gallup or rub - no appreciable M  Abdomen: Nontender, protuberent, soft, bowel sounds positive, no rebound, no ascites, no appreciable mass Extremities: No significant cyanosis, clubbing, or edema bilateral lower extremities  Data Reviewed: Basic Metabolic Panel:  Recent Labs Lab 04/01/14 1414 04/01/14 2018 04/02/14 0253 04/03/14 0045  NA 131*  --  132* 132*  K 3.9  --  3.4* 3.7  CL 90*  --  97 99  CO2 23  --  20 20  GLUCOSE 94  --  107* 137*  BUN 31*  --  25* 23  CREATININE 1.00 0.80 0.72 0.76  CALCIUM 8.9  --  7.7* 7.7*  MG  --   --   --  2.2  PHOS  --   --   --  1.6*   Liver Function Tests:  Recent Labs Lab 04/01/14 1414 04/02/14 0253 04/03/14 0045  AST 254* 157* 90*  ALT 89* 74* 61*  ALKPHOS 80 68 83  BILITOT 1.0 0.8 1.1  PROT 7.2 6.0 5.9*  ALBUMIN 3.2* 2.6* 2.3*   Coags:  Recent Labs Lab 04/02/14 0253  INR 1.39   No results found for this basename: PTT,  in the last 168 hours  CBC:  Recent Labs Lab 04/01/14 1414 04/01/14 2018 04/02/14 0253 04/03/14 0045  WBC 16.6* 15.2* 14.4* 16.5*  NEUTROABS 14.3*  --  11.8*  --   HGB 19.2* 18.3* 16.9 16.9  HCT 53.8*  50.7 47.3 46.6  MCV 93.7 91.2 90.8 89.6  PLT 96* 95* 98* 86*    Cardiac Enzymes:  Recent Labs Lab 04/01/14 1530 04/01/14 2018 04/02/14 0200 04/02/14 0752 04/02/14 0808 04/02/14 1420 04/03/14 0045  CKTOTAL  --  7232* 0867*  --  2528* 1550* 1045*  TROPONINI <0.30 <0.30 <0.30 <0.30  --   --   --     Recent Results (from the past 240 hour(s))  URINE CULTURE     Status: None   Collection Time    04/01/14  4:47 PM      Result Value Ref Range Status   Specimen Description URINE, CATHETERIZED   Final   Special Requests ADDED [id number]   Final   Culture  Setup Time     Final   Value: 04/02/2014 05:58     Performed at [facility]     Final   Value: 1,000 COLONIES/ML     Performed at [facility]   Culture     Final   Value: INSIGNIFICANT GROWTH     Performed at [facility]   Report Status 04/03/2014 FINAL   Final  CLOSTRIDIUM DIFFICILE BY PCR     Status: None   Collection Time    04/01/14  5:17 PM      Result Value Ref Range Status   C difficile by pcr NEGATIVE  NEGATIVE Final  CULTURE, BLOOD (ROUTINE X 2)     Status: None   Collection Time    04/01/14  8:10 PM      Result Value Ref Range Status   Specimen Description BLOOD HAND RIGHT   Final   Special Requests BOTTLES DRAWN AEROBIC AND ANAEROBIC 10CC   Final   Culture  Setup Time     Final   Value: 04/02/2014 00:57     Performed at [facility]   Culture     Final   Value: GROUP B STREP(S.AGALACTIAE)ISOLATED     Note: Gram Stain Report Called to,Read Back By and Verified With: BERNADETTE RONCALLO 04/02/14 1400 BY SMITHERSJ     Performed at [facility]   Report Status PENDING   Incomplete  CULTURE, BLOOD (ROUTINE X 2)     Status: None   Collection Time    04/01/14  8:18 PM      Result Value Ref Range Status   Specimen Description BLOOD HAND RIGHT   Final   Special Requests BOTTLES DRAWN AEROBIC AND ANAEROBIC 5CC   Final   Culture  Setup Time     Final    Value: 04/02/2014 00:56     Performed at [facility]   Culture     Final   Value: GROUP B STREP(S.AGALACTIAE)ISOLATED  Note: Gram Stain Report Called to,Read Back By and Verified With: BERNADETTE RONCALLO 04/02/14 1400 BY SMITHERSJ     Performed at [facility]   Report Status PENDING   Incomplete  MRSA PCR SCREENING     Status: None   Collection Time    04/01/14  9:42 PM      Result Value Ref Range Status   MRSA by PCR NEGATIVE  NEGATIVE Final   Comment:            The GeneXpert MRSA Assay (FDA     approved for NASAL specimens     only), is one component of a     comprehensive MRSA colonization     surveillance program. It is not     intended to diagnose MRSA     infection nor to guide or     monitor treatment for     MRSA infections.     Studies:  Recent x-ray studies have been reviewed in detail by the Attending Physician  Scheduled Meds:  Scheduled Meds: . amiodarone  400 mg Oral BID  . antiseptic oral rinse  7 mL Mouth Rinse BID  . apixaban  5 mg Oral BID  . diltiazem  60 mg Oral 4 times per day  . ondansetron  4 mg Intravenous Once  . pantoprazole (PROTONIX) IV  40 mg Intravenous Q12H  . piperacillin-tazobactam (ZOSYN)  IV  3.375 g Intravenous 3 times per day  . sodium chloride  3 mL Intravenous Q12H    Time spent on care of this patient: 35 mins   Khristen Cheyney T , MD   [facility] Office  [phone] Pager - Text Page per Shea Evans as per below:  On-Call/Text Page:      [email or web address]      password [email or web address]  If 7PM-7AM, please contact night-coverage [email or web address] Password [email or web address] 04/03/2014, 12:20 PM   LOS: 2 days

## 2014-04-03 NOTE — Progress Notes (Signed)
Patient Name: [name] Date of Encounter: 04/03/2014     Active Problems:   Dehydration   Leukocytosis   Atrial fibrillation with rapid ventricular response   Abdominal pain, generalized   Nausea vomiting and diarrhea   Back pain   Transaminitis   Sepsis   Thrombocytopenia, unspecified    SUBJECTIVE  The patient feels better.  However he is still very weak from his rhabdomyolysis.  He has converted to normal sinus rhythm.  CURRENT MEDS . antiseptic oral rinse  7 mL Mouth Rinse BID  . apixaban  5 mg Oral BID  . ondansetron  4 mg Intravenous Once  . pantoprazole (PROTONIX) IV  40 mg Intravenous Q12H  . piperacillin-tazobactam (ZOSYN)  IV  3.375 g Intravenous 3 times per day  . sodium chloride  3 mL Intravenous Q12H    OBJECTIVE  Filed Vitals:   04/03/14 0300 04/03/14 0433 04/03/14 0500 04/03/14 0800  BP: 119/66 132/63 143/75   Pulse: 117 119 103   Temp:  98.9 F (37.2 C)  100.5 F (38.1 C)  TempSrc:  Oral  Oral  Resp: 24 23 27    Height:      Weight:  210 lb 5.1 oz (95.4 kg)    SpO2: 92% 93% 94%     Intake/Output Summary (Last 24 hours) at 04/03/14 0952 Last data filed at 04/03/14 0434  Gross per 24 hour  Intake   1390 ml  Output   2000 ml  Net   -610 ml   Filed Weights   04/01/14 2315 04/03/14 0433  Weight: 285 lb 4.4 oz (129.4 kg) 210 lb 5.1 oz (95.4 kg)    PHYSICAL EXAM  General: Pleasant, NAD.  Too weak to sit up in bed. Neuro: Alert and oriented X 3.  Psych: Normal affect. HEENT:  Normal  Neck: Supple without bruits or JVD. Lungs:  Resp regular and unlabored, CTA. Heart: RRR no s3, s4, or murmurs. Abdomen: Soft, non-tender, non-distended, BS + x 4.  Extremities: No clubbing, cyanosis or edema. DP/PT/Radials 2+ and equal bilaterally.  Accessory Clinical Findings  CBC  Recent Labs  04/01/14 1414  04/02/14 0253 04/03/14 0045  WBC 16.6*  < > 14.4* 16.5*  NEUTROABS 14.3*  --  11.8*  --   HGB 19.2*  < > 16.9 16.9  HCT 53.8*  < >  47.3 46.6  MCV 93.7  < > 90.8 89.6  PLT 96*  < > 98* 86*  < > = values in this interval not displayed. Basic Metabolic Panel  Recent Labs  04/02/14 0253 04/03/14 0045  NA 132* 132*  K 3.4* 3.7  CL 97 99  CO2 20 20  GLUCOSE 107* 137*  BUN 25* 23  CREATININE 0.72 0.76  CALCIUM 7.7* 7.7*  MG  --  2.2  PHOS  --  1.6*   Liver Function Tests  Recent Labs  04/02/14 0253 04/03/14 0045  AST 157* 90*  ALT 74* 61*  ALKPHOS 68 83  BILITOT 0.8 1.1  PROT 6.0 5.9*  ALBUMIN 2.6* 2.3*   No results found for this basename: LIPASE, AMYLASE,  in the last 72 hours Cardiac Enzymes  Recent Labs  04/01/14 2018 04/02/14 0200 04/02/14 0752 04/02/14 0808 04/02/14 1420 04/03/14 0045  CKTOTAL 7232* 4522*  --  2528* 1550* 1045*  TROPONINI <0.30 <0.30 <0.30  --   --   --    BNP No components found with this basename: POCBNP,  D-Dimer No results found for this  basename: DDIMER,  in the last 72 hours Hemoglobin A1C No results found for this basename: HGBA1C,  in the last 72 hours Fasting Lipid Panel No results found for this basename: CHOL, HDL, LDLCALC, TRIG, CHOLHDL, LDLDIRECT,  in the last 72 hours Thyroid Function Tests  Recent Labs  04/01/14 2018  TSH 0.976    TELE  Normal sinus rhythm  ECG    Radiology/Studies  Dg Chest 2 View  04/01/2014   CLINICAL DATA:  Shortness of breath.  Cough.  EXAM: CHEST  2 VIEW  COMPARISON:  CT scan dated 08/23/2005  FINDINGS: Heart size and pulmonary vascularity are normal. Lungs are clear. No acute osseous abnormality. Prominent right first costochondral junction.  IMPRESSION: No active cardiopulmonary disease.   Electronically Signed   By: Rozetta Nunnery M.D.   On: 04/01/2014 17:25   Dg Lumbar Spine Complete  04/01/2014   CLINICAL DATA:  Low back pain.  EXAM: LUMBAR SPINE - COMPLETE 4+ VIEW  COMPARISON:  CT abdomen and pelvis 01/25/2013  FINDINGS: Mild lumbar levoscoliosis his again seen with severe asymmetric right-sided disc space  narrowing at L2-3 and L3-4 with associated vertebral body sclerosis and bridging osteophytes. 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 Trace retrolisthesis of L2 on L3 is unchanged and likely degenerative. There is no evidence of compression fracture. Atherosclerotic aortic calcification is noted.  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 No acute osseous abnormality identified.   Electronically Signed   By: Logan Bores   On: 04/01/2014 17:27   Ct Abdomen Pelvis W Contrast  04/01/2014   CLINICAL DATA:  Lower back pain and generalized weakness. Vomiting and anorexia.  EXAM: CT ABDOMEN AND PELVIS WITH CONTRAST  TECHNIQUE: Multidetector CT imaging of the abdomen and pelvis was performed using the standard protocol following bolus administration of intravenous contrast.  CONTRAST:  131mL OMNIPAQUE IOHEXOL 300 MG/ML  SOLN  COMPARISON:  CT of the abdomen and pelvis from 01/25/2013  FINDINGS: Minimal bibasilar atelectasis is noted. Scattered coronary artery calcifications are seen.  A stable 2.6 cm hypodensity within the left hepatic lobe likely reflects a cyst. The liver and spleen are otherwise unremarkable. The gallbladder is borderline normal in size, and grossly unremarkable in appearance. The pancreas and adrenal glands are unremarkable.  Diffuse perinephric stranding is noted. As this is new from the prior study, it raises concern for mild diffuse bilateral pyelonephritis.  A 9 mm cyst is noted at the upper pole of the right kidney. Scattered vascular calcifications are noted at the right renal hilum. The kidneys are otherwise unremarkable. There is no evidence of hydronephrosis. No renal or ureteral stones are identified.  Trace free fluid is seen tracking along the mid to distal left ureter, at the left lower quadrant.  The small bowel is unremarkable in appearance. The stomach is within normal limits. No acute vascular abnormalities are seen.  Indication is  status post appendectomy. Contrast progresses to the level of the sigmoid colon. The colon is unremarkable in appearance.  The bladder is mildly distended and grossly unremarkable. The prostate is mildly enlarged, measuring 4.9 cm in transverse dimension. No inguinal lymphadenopathy is seen.  No acute osseous abnormalities are identified. Multilevel vacuum phenomenon, endplate sclerotic change and disc space narrowing are noted along the lumbar spine.  IMPRESSION: 1. Diffuse perinephric stranding, new from the prior study. This raises concern for mild diffuse bilateral pyelonephritis. Would correlate for associated symptoms. 2. Small right renal cyst and likely hepatic cyst. 3.  Scattered coronary artery calcifications seen. 4. Mildly enlarged prostate. 5. Mild degenerative change noted along the lumbar spine.   Electronically Signed   By: Garald Balding M.D.   On: 04/01/2014 21:50    ASSESSMENT AND PLAN 1.  Paroxysmal atrial fibrillation, resolved 2. Rhabdomyolysis 3. dehydration, improved with IV fluids  Plan: Await results of two-dimensional echocardiogram which can be done today now that his heart rate is slow down. Convert amiodarone and diltiazem to oral route   Signed, Darlin Coco MD

## 2014-04-04 LAB — COMPREHENSIVE METABOLIC PANEL
ALK PHOS: 95 U/L (ref 39–117)
ALT: 52 U/L (ref 0–53)
AST: 60 U/L — AB (ref 0–37)
Albumin: 2.2 g/dL — ABNORMAL LOW (ref 3.5–5.2)
Anion gap: 13 (ref 5–15)
BILIRUBIN TOTAL: 1.7 mg/dL — AB (ref 0.3–1.2)
BUN: 20 mg/dL (ref 6–23)
CHLORIDE: 101 meq/L (ref 96–112)
CO2: 24 meq/L (ref 19–32)
CREATININE: 0.65 mg/dL (ref 0.50–1.35)
Calcium: 7.8 mg/dL — ABNORMAL LOW (ref 8.4–10.5)
GFR calc Af Amer: >90 mL/min (ref >90)
GFR calc non Af Amer: >90 mL/min (ref >90)
Glucose, Bld: 121 mg/dL — ABNORMAL HIGH (ref 70–99)
Potassium: 3.2 mEq/L — ABNORMAL LOW (ref 3.7–5.3)
Sodium: 138 mEq/L (ref 137–147)
Total Protein: 5.8 g/dL — ABNORMAL LOW (ref 6.0–8.3)

## 2014-04-04 LAB — CBC
HEMATOCRIT: 49 % (ref 39.0–52.0)
Hemoglobin: 17.5 g/dL — ABNORMAL HIGH (ref 13.0–17.0)
MCH: 32.9 pg (ref 26.0–34.0)
MCHC: 35.7 g/dL (ref 30.0–36.0)
MCV: 92.1 fL (ref 78.0–100.0)
Platelets: 129 10*3/uL — ABNORMAL LOW (ref 150–400)
RBC: 5.32 MIL/uL (ref 4.22–5.81)
RDW: 14.3 % (ref 11.5–15.5)
WBC: 18.2 10*3/uL — ABNORMAL HIGH (ref 4.0–10.5)

## 2014-04-04 LAB — CULTURE, BLOOD (ROUTINE X 2)

## 2014-04-04 LAB — MAGNESIUM: Magnesium: 2.1 mg/dL (ref 1.5–2.5)

## 2014-04-04 LAB — CK: Total CK: 270 U/L — ABNORMAL HIGH (ref 7–232)

## 2014-04-04 LAB — PHOSPHORUS: PHOSPHORUS: 2 mg/dL — AB (ref 2.3–4.6)

## 2014-04-04 MED ORDER — AMIODARONE HCL IN DEXTROSE 360-4.14 MG/200ML-% IV SOLN
30.0000 mg/h | INTRAVENOUS | Status: DC
  Administered 2014-04-04 – 2014-04-05 (×3): 30 mg/h via INTRAVENOUS
  Filled 2014-04-04 (×9): qty 200

## 2014-04-04 MED ORDER — DEXTROSE 5 % IV SOLN
1.0000 g | INTRAVENOUS | Status: DC
  Administered 2014-04-04 – 2014-04-05 (×2): 1 g via INTRAVENOUS
  Filled 2014-04-04 (×3): qty 10

## 2014-04-04 MED ORDER — AMIODARONE HCL IN DEXTROSE 360-4.14 MG/200ML-% IV SOLN
60.0000 mg/h | INTRAVENOUS | Status: AC
  Administered 2014-04-04 (×2): 60 mg/h via INTRAVENOUS
  Filled 2014-04-04: qty 200

## 2014-04-04 MED ORDER — SENNOSIDES-DOCUSATE SODIUM 8.6-50 MG PO TABS
1.0000 | ORAL_TABLET | Freq: Two times a day (BID) | ORAL | Status: DC
  Administered 2014-04-04 – 2014-04-13 (×10): 1 via ORAL
  Filled 2014-04-04 (×23): qty 1

## 2014-04-04 MED ORDER — POTASSIUM CHLORIDE CRYS ER 20 MEQ PO TBCR
40.0000 meq | EXTENDED_RELEASE_TABLET | Freq: Once | ORAL | Status: AC
  Administered 2014-04-04: 40 meq via ORAL
  Filled 2014-04-04: qty 2

## 2014-04-04 MED ORDER — AMIODARONE LOAD VIA INFUSION
150.0000 mg | Freq: Once | INTRAVENOUS | Status: AC
  Administered 2014-04-04: 150 mg via INTRAVENOUS
  Filled 2014-04-04: qty 83.34

## 2014-04-04 MED ORDER — DILTIAZEM HCL 90 MG PO TABS
90.0000 mg | ORAL_TABLET | Freq: Four times a day (QID) | ORAL | Status: DC
  Administered 2014-04-04 – 2014-04-07 (×12): 90 mg via ORAL
  Filled 2014-04-04 (×16): qty 1

## 2014-04-04 MED ORDER — POTASSIUM CHLORIDE CRYS ER 20 MEQ PO TBCR
20.0000 meq | EXTENDED_RELEASE_TABLET | Freq: Two times a day (BID) | ORAL | Status: DC
  Administered 2014-04-04: 20 meq via ORAL
  Filled 2014-04-04: qty 1

## 2014-04-04 MED ORDER — POTASSIUM CHLORIDE CRYS ER 20 MEQ PO TBCR
40.0000 meq | EXTENDED_RELEASE_TABLET | Freq: Two times a day (BID) | ORAL | Status: DC
Start: 2014-04-04 — End: 2014-04-05
  Administered 2014-04-05 (×2): 40 meq via ORAL
  Filled 2014-04-04 (×3): qty 2

## 2014-04-04 NOTE — Progress Notes (Signed)
Patient Name: [name] Date of Encounter: 04/04/2014     Active Problems:   Dehydration   Leukocytosis   Atrial fibrillation with rapid ventricular response   Abdominal pain, generalized   Nausea vomiting and diarrhea   Back pain   Transaminitis   Sepsis   Thrombocytopenia, unspecified    SUBJECTIVE  The patient feels a little better today.  He is not quite as sore in his muscles. Yesterday he stayed in normal sinus rhythm until shortly after new when he went back into atrial fibrillation with rapid ventricular response.  He has remained in atrial fib with rapid ventricular response overnight.  He has not been complaining of any chest pain or increased shortness of breath.  IV Cardizem boluses have not slowed his heart rate very much.  CURRENT MEDS . amiodarone  400 mg Oral BID  . antiseptic oral rinse  7 mL Mouth Rinse BID  . apixaban  5 mg Oral BID  . diltiazem  60 mg Oral 4 times per day  . ondansetron  4 mg Intravenous Once  . piperacillin-tazobactam (ZOSYN)  IV  3.375 g Intravenous 3 times per day  . sodium chloride  3 mL Intravenous Q12H    OBJECTIVE  Filed Vitals:   04/04/14 0406 04/04/14 0500 04/04/14 0805 04/04/14 0900  BP: 149/73 150/77 162/68 145/75  Pulse: 135  70 68  Temp: 98.2 F (36.8 C)   99.6 F (37.6 C)  TempSrc: Oral   Oral  Resp: 18  23 19   Height:      Weight: 210 lb 5.1 oz (95.4 kg)     SpO2: 95%  94% 93%    Intake/Output Summary (Last 24 hours) at 04/04/14 1029 Last data filed at 04/04/14 0407  Gross per 24 hour  Intake 2142.5 ml  Output   1450 ml  Net  692.5 ml   Filed Weights   04/01/14 2315 04/03/14 0433 04/04/14 0406  Weight: 285 lb 4.4 oz (129.4 kg) 210 lb 5.1 oz (95.4 kg) 210 lb 5.1 oz (95.4 kg)    PHYSICAL EXAM  General: Pleasant, NAD.  Able to sit up on the side of the bed today. Neuro: Alert and oriented X 3. Moves all extremities spontaneously. Psych: Normal affect. HEENT:  Normal  Neck: Supple without bruits  or JVD. Lungs:  Resp regular and unlabored, CTA. Heart: Rapid irregular atrial fibrillation. no s3, s4, or murmurs. Abdomen: Soft, non-tender, non-distended, BS + x 4.  Extremities: No clubbing, cyanosis or edema. DP/PT/Radials 2+ and equal bilaterally.  Accessory Clinical Findings  CBC  Recent Labs  04/01/14 1414  04/02/14 0253 04/03/14 0045 04/04/14 0309  WBC 16.6*  < > 14.4* 16.5* 18.2*  NEUTROABS 14.3*  --  11.8*  --   --   HGB 19.2*  < > 16.9 16.9 17.5*  HCT 53.8*  < > 47.3 46.6 49.0  MCV 93.7  < > 90.8 89.6 92.1  PLT 96*  < > 98* 86* 129*  < > = values in this interval not displayed. Basic Metabolic Panel  Recent Labs  04/03/14 0045 04/04/14 0309  NA 132* 138  K 3.7 3.2*  CL 99 101  CO2 20 24  GLUCOSE 137* 121*  BUN 23 20  CREATININE 0.76 0.65  CALCIUM 7.7* 7.8*  MG 2.2 2.1  PHOS 1.6* 2.0*   Liver Function Tests  Recent Labs  04/03/14 0045 04/04/14 0309  AST 90* 60*  ALT 61* 52  ALKPHOS 83 95  BILITOT 1.1 1.7*  PROT 5.9* 5.8*  ALBUMIN 2.3* 2.2*   No results found for this basename: LIPASE, AMYLASE,  in the last 72 hours Cardiac Enzymes  Recent Labs  04/01/14 2018 04/02/14 0200 04/02/14 0752  04/02/14 1420 04/03/14 0045 04/04/14 0309  CKTOTAL 7232* 4522*  --   < > 1550* 1045* 270*  TROPONINI <0.30 <0.30 <0.30  --   --   --   --   < > = values in this interval not displayed. BNP No components found with this basename: POCBNP,  D-Dimer No results found for this basename: DDIMER,  in the last 72 hours Hemoglobin A1C No results found for this basename: HGBA1C,  in the last 72 hours Fasting Lipid Panel No results found for this basename: CHOL, HDL, LDLCALC, TRIG, CHOLHDL, LDLDIRECT,  in the last 72 hours Thyroid Function Tests  Recent Labs  04/01/14 2018  TSH 0.976    TELE  Atrial fibrillation with rapid ventricular response.  ECG    Radiology/Studies  Dg Chest 2 View  04/01/2014   CLINICAL DATA:  Shortness of breath.   Cough.  EXAM: CHEST  2 VIEW  COMPARISON:  CT scan dated 08/23/2005  FINDINGS: Heart size and pulmonary vascularity are normal. Lungs are clear. No acute osseous abnormality. Prominent right first costochondral junction.  IMPRESSION: No active cardiopulmonary disease.   Electronically Signed   By: Rozetta Nunnery M.D.   On: 04/01/2014 17:25   Dg Lumbar Spine Complete  04/01/2014   CLINICAL DATA:  Low back pain.  EXAM: LUMBAR SPINE - COMPLETE 4+ VIEW  COMPARISON:  CT abdomen and pelvis 01/25/2013  FINDINGS: Mild lumbar levoscoliosis his again seen with severe asymmetric right-sided disc space narrowing at L2-3 and L3-4 with associated vertebral body sclerosis and bridging osteophytes. 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 Trace retrolisthesis of L2 on L3 is unchanged and likely degenerative. There is no evidence of compression fracture. Atherosclerotic aortic calcification is noted.  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 No acute osseous abnormality identified.   Electronically Signed   By: Logan Bores   On: 04/01/2014 17:27   Ct Abdomen Pelvis W Contrast  04/01/2014   CLINICAL DATA:  Lower back pain and generalized weakness. Vomiting and anorexia.  EXAM: CT ABDOMEN AND PELVIS WITH CONTRAST  TECHNIQUE: Multidetector CT imaging of the abdomen and pelvis was performed using the standard protocol following bolus administration of intravenous contrast.  CONTRAST:  16mL OMNIPAQUE IOHEXOL 300 MG/ML  SOLN  COMPARISON:  CT of the abdomen and pelvis from 01/25/2013  FINDINGS: Minimal bibasilar atelectasis is noted. Scattered coronary artery calcifications are seen.  A stable 2.6 cm hypodensity within the left hepatic lobe likely reflects a cyst. The liver and spleen are otherwise unremarkable. The gallbladder is borderline normal in size, and grossly unremarkable in appearance. The pancreas and adrenal glands are unremarkable.  Diffuse perinephric stranding is  noted. As this is new from the prior study, it raises concern for mild diffuse bilateral pyelonephritis.  A 9 mm cyst is noted at the upper pole of the right kidney. Scattered vascular calcifications are noted at the right renal hilum. The kidneys are otherwise unremarkable. There is no evidence of hydronephrosis. No renal or ureteral stones are identified.  Trace free fluid is seen tracking along the mid to distal left ureter, at the left lower quadrant.  The small bowel is unremarkable in appearance. The stomach is within normal limits. No acute vascular abnormalities are  seen.  Indication is status post appendectomy. Contrast progresses to the level of the sigmoid colon. The colon is unremarkable in appearance.  The bladder is mildly distended and grossly unremarkable. The prostate is mildly enlarged, measuring 4.9 cm in transverse dimension. No inguinal lymphadenopathy is seen.  No acute osseous abnormalities are identified. Multilevel vacuum phenomenon, endplate sclerotic change and disc space narrowing are noted along the lumbar spine.  IMPRESSION: 1. Diffuse perinephric stranding, new from the prior study. This raises concern for mild diffuse bilateral pyelonephritis. Would correlate for associated symptoms. 2. Small right renal cyst and likely hepatic cyst. 3. Scattered coronary artery calcifications seen. 4. Mildly enlarged prostate. 5. Mild degenerative change noted along the lumbar spine.   Electronically Signed   By: Garald Balding M.D.   On: 04/01/2014 21:50    ASSESSMENT AND PLAN 1. Paroxysmal atrial fibrillation, with rapid ventricular response, recurrent. 2. Rhabdomyolysis  3. dehydration, improved with IV fluids  Plan: I will switch amiodarone back to IV route. Will increase po cardizem. Echocardiogram yesterday unfortunately was done after he had gone back into rapid atrial fibrillation.  He will need a repeat study with acoustic contrast after his heart rate has slowed down to evaluate LV  function.  Signed, Darlin Coco MD

## 2014-04-04 NOTE — Discharge Instructions (Signed)
Information on my medicine - ELIQUIS (apixaban)  This medication education was reviewed with me or my healthcare representative as part of my discharge preparation.  The pharmacist that spoke with me during my hospital stay was:  Von Nils Flack, RPH  Why was Eliquis prescribed for you? Eliquis was prescribed for you to reduce the risk of forming blood clots that can cause a stroke if you have a medical condition called atrial fibrillation (a type of irregular heartbeat) OR to reduce the risk of a blood clots forming after orthopedic surgery.  What do You need to know about Eliquis ? Take your Eliquis TWICE DAILY - one tablet in the morning and one tablet in the evening with or without food.  It would be best to take the doses about the same time each day.  If you have difficulty swallowing the tablet whole please discuss with your pharmacist how to take the medication safely.  Take Eliquis exactly as prescribed by your doctor and DO NOT stop taking Eliquis without talking to the doctor who prescribed the medication.  Stopping may increase your risk of developing a new clot or stroke.  Refill your prescription before you run out.  After discharge, you should have regular check-up appointments with your healthcare provider that is prescribing your Eliquis.  In the future your dose may need to be changed if your kidney function or weight changes by a significant amount or as you get older.  What do you do if you miss a dose? If you miss a dose, take it as soon as you remember on the same day and resume taking twice daily.  Do not take more than one dose of ELIQUIS at the same time.  Important Safety Information A possible side effect of Eliquis is bleeding. You should call your healthcare provider right away if you experience any of the following:   Bleeding from an injury or your nose that does not stop.   Unusual colored urine (red or dark brown) or unusual colored stools (red or  black).   Unusual bruising for unknown reasons.   A serious fall or if you hit your head (even if there is no bleeding).  Some medicines may interact with Eliquis and might increase your risk of bleeding or clotting while on Eliquis. To help avoid this, consult your healthcare provider or pharmacist prior to using any new prescription or non-prescription medications, including herbals, vitamins, non-steroidal anti-inflammatory drugs (NSAIDs) and supplements.  This website has more information on Eliquis (apixaban): [email or web address].

## 2014-04-04 NOTE — Progress Notes (Signed)
[facility] TEAM 1 - Stepdown/ICU TEAM Progress Note  ZERICK PREVETTE GBT:[id number] DOB: 1940-09-12 DOA: 04/01/2014 PCP: [name], MD  Admit HPI / Brief Narrative: 73 year old male with a history of appendicitis status post appendectomy July 2014, lumbar degenerative disease s/p lumbar surgery several years ago who presented with nausea/vomiting/diarrhea, back pain, and A. Fib with RVR. Pt had progressive nausea, vomiting and diarrhea over 2-3 days. Also developed new onset back pain. Was at home and slid down the bed. Was unable to get up from the floor for several hours because of weakness.   In the ER patient noted to be in A. Fib with heart rate into the 140s. Blood pressure in the 100s to 130s. Satting greater than 92% on room air. Notable labs include a white blood cell count 16.6, hemoglobin 19.2, platelets 96, sodium 131, creatinine 1.0, BUN 31, AST 254, ALT 89. Lactate 2.45. CK 12k. Trop 0.09, then <0.3. UA mildly indicative of infection. L. Spine x-ray noted general lumbar degeneration. Chest x-ray within normal limits.  HPI/Subjective: Much less muscular aching today.  Denies cp, sob, abdom pain, n/v.  Denies dental pain or recent dental procedure.  Denies skin lesion/sore or abscesses.    Assessment/Plan:  Sepsis due to Goup B Strep - 2/2 blood cx - source unclear Urine cx not convincing - UA equivocal - sepsis physiology improving - cont broad spectrum abx   Lactic acidosis  Resolved w/ volume expansion  Newly diagnosed Afib w/ RVR  Per Cardiology - meds converted to oral, but now back on IV as RVR recurred - heparin > eliquis  Rhabdomyolysis  Ck peak 12k - essentially resolved   Guaiac positive  In setting of diarrhea, significance of this is not clear - Hgb stable   Transaminitis likely shock liver - improving w/ hydration   Back pain  Will have to consider if MRI of spine indicated, but back pain improved today  Abdom pain - N/V - diarrhea   resolved  Thrombocytopenia Follow   Morbid obesity - Body mass index is 30.18 kg/(m^2).  Code Status: FULL Family Communication: no family present at time of exam Disposition Plan: SDU  Consultants: Cardiology Neurology   Procedures: none  Antibiotics: Zosyn 9/17 > 9/20 Rocephin 9/20 >  DVT prophylaxis: eliquis  Objective: Blood pressure 120/71, pulse 92, temperature 98.5 F (36.9 C), temperature source Oral, resp. rate 25, height 5\' 10"  (1.778 m), weight 95.4 kg (210 lb 5.1 oz), SpO2 91.00%.  Intake/Output Summary (Last 24 hours) at 04/04/14 1549 Last data filed at 04/04/14 1308  Gross per 24 hour  Intake 1352.5 ml  Output   1700 ml  Net -347.5 ml   Exam: General: No acute respiratory distress Lungs: Clear to auscultation bilaterally without wheezes or crackles Cardiovascular: irreg irreg and tachy at 130bpm -  w/o gallup or rub - no appreciable M  Abdomen: Nontender, protuberent, soft, bowel sounds positive, no rebound, no ascites, no appreciable mass Extremities: No significant cyanosis, clubbing, or edema bilateral lower extremities  Data Reviewed: Basic Metabolic Panel:  Recent Labs Lab 04/01/14 1414 04/01/14 2018 04/02/14 0253 04/03/14 0045 04/04/14 0309  NA 131*  --  132* 132* 138  K 3.9  --  3.4* 3.7 3.2*  CL 90*  --  97 99 101  CO2 23  --  20 20 24   GLUCOSE 94  --  107* 137* 121*  BUN 31*  --  25* 23 20  CREATININE 1.00 0.80 0.72 0.76 0.65  CALCIUM 8.9  --  7.7* 7.7* 7.8*  MG  --   --   --  2.2 2.1  PHOS  --   --   --  1.6* 2.0*   Liver Function Tests:  Recent Labs Lab 04/01/14 1414 04/02/14 0253 04/03/14 0045 04/04/14 0309  AST 254* 157* 90* 60*  ALT 89* 74* 61* 52  ALKPHOS 80 68 83 95  BILITOT 1.0 0.8 1.1 1.7*  PROT 7.2 6.0 5.9* 5.8*  ALBUMIN 3.2* 2.6* 2.3* 2.2*   Coags:  Recent Labs Lab 04/02/14 0253  INR 1.39   CBC:  Recent Labs Lab 04/01/14 1414 04/01/14 2018 04/02/14 0253 04/03/14 0045 04/04/14 0309  WBC  16.6* 15.2* 14.4* 16.5* 18.2*  NEUTROABS 14.3*  --  11.8*  --   --   HGB 19.2* 18.3* 16.9 16.9 17.5*  HCT 53.8* 50.7 47.3 46.6 49.0  MCV 93.7 91.2 90.8 89.6 92.1  PLT 96* 95* 98* 86* 129*    Cardiac Enzymes:  Recent Labs Lab 04/01/14 1530 04/01/14 2018 04/02/14 0200 04/02/14 0752 04/02/14 0808 04/02/14 1420 04/03/14 0045 04/04/14 0309  CKTOTAL  --  7232* 4522*  --  2528* 82* 1045* 59*  TROPONINI <0.30 <0.30 <0.30 <0.30  --   --   --   --     Recent Results (from the past 240 hour(s))  URINE CULTURE     Status: None   Collection Time    04/01/14  4:47 PM      Result Value Ref Range Status   Specimen Description URINE, CATHETERIZED   Final   Special Requests ADDED [id number]   Final   Culture  Setup Time     Final   Value: 04/02/2014 05:58     Performed at [facility]     Final   Value: 1,000 COLONIES/ML     Performed at [facility]   Culture     Final   Value: INSIGNIFICANT GROWTH     Performed at [facility]   Report Status 04/03/2014 FINAL   Final  STOOL CULTURE     Status: None   Collection Time    04/01/14  5:17 PM      Result Value Ref Range Status   Specimen Description STOOL   Final   Special Requests NONE   Final   Culture     Final   Value: NO SUSPICIOUS COLONIES, CONTINUING TO HOLD     Performed at [facility]   Report Status PENDING   Incomplete  CLOSTRIDIUM DIFFICILE BY PCR     Status: None   Collection Time    04/01/14  5:17 PM      Result Value Ref Range Status   C difficile by pcr NEGATIVE  NEGATIVE Final  CULTURE, BLOOD (ROUTINE X 2)     Status: None   Collection Time    04/01/14  8:10 PM      Result Value Ref Range Status   Specimen Description BLOOD HAND RIGHT   Final   Special Requests BOTTLES DRAWN AEROBIC AND ANAEROBIC 10CC   Final   Culture  Setup Time     Final   Value: 04/02/2014 00:57     Performed at [facility]   Culture     Final   Value: GROUP B  STREP(S.AGALACTIAE)ISOLATED     Note: Gram Stain Report Called to,Read Back By and Verified With: BERNADETTE RONCALLO 04/02/14 1400 BY SMITHERSJ  Performed at [facility]   Report Status 04/04/2014 FINAL   Final   Organism ID, Bacteria GROUP B STREP(S.AGALACTIAE)ISOLATED   Final  CULTURE, BLOOD (ROUTINE X 2)     Status: None   Collection Time    04/01/14  8:18 PM      Result Value Ref Range Status   Specimen Description BLOOD HAND RIGHT   Final   Special Requests BOTTLES DRAWN AEROBIC AND ANAEROBIC 5CC   Final   Culture  Setup Time     Final   Value: 04/02/2014 00:56     Performed at [facility]   Culture     Final   Value: GROUP B STREP(S.AGALACTIAE)ISOLATED     Note: SUSCEPTIBILITIES PERFORMED ON PREVIOUS CULTURE WITHIN THE LAST 5 DAYS.     Note: Gram Stain Report Called to,Read Back By and Verified With: BERNADETTE RONCALLO 04/02/14 1400 BY SMITHERSJ     Performed at [facility]   Report Status 04/04/2014 FINAL   Final  MRSA PCR SCREENING     Status: None   Collection Time    04/01/14  9:42 PM      Result Value Ref Range Status   MRSA by PCR NEGATIVE  NEGATIVE Final   Comment:            The GeneXpert MRSA Assay (FDA     approved for NASAL specimens     only), is one component of a     comprehensive MRSA colonization     surveillance program. It is not     intended to diagnose MRSA     infection nor to guide or     monitor treatment for     MRSA infections.     Studies:  Recent x-ray studies have been reviewed in detail by the Attending Physician  Scheduled Meds:  Scheduled Meds: . antiseptic oral rinse  7 mL Mouth Rinse BID  . apixaban  5 mg Oral BID  . diltiazem  90 mg Oral 4 times per day  . ondansetron  4 mg Intravenous Once  . piperacillin-tazobactam (ZOSYN)  IV  3.375 g Intravenous 3 times per day  . potassium chloride  20 mEq Oral BID  . sodium chloride  3 mL Intravenous Q12H    Time spent on care of this patient: 35  mins   Naryah Clenney T , MD   [facility] Office  [phone] Pager - Text Page per Shea Evans as per below:  On-Call/Text Page:      [email or web address]      password [email or web address]  If 7PM-7AM, please contact night-coverage [email or web address] Password [email or web address] 04/04/2014, 3:49 PM   LOS: 3 days

## 2014-04-05 ENCOUNTER — Encounter (HOSPITAL_COMMUNITY): Payer: Self-pay | Admitting: *Deleted

## 2014-04-05 DIAGNOSIS — R7402 Elevation of levels of lactic acid dehydrogenase (LDH): Secondary | ICD-10-CM

## 2014-04-05 DIAGNOSIS — R740 Nonspecific elevation of levels of transaminase and lactic acid dehydrogenase [LDH]: Secondary | ICD-10-CM

## 2014-04-05 DIAGNOSIS — I519 Heart disease, unspecified: Secondary | ICD-10-CM

## 2014-04-05 LAB — BASIC METABOLIC PANEL
Anion gap: 10 (ref 5–15)
BUN: 21 mg/dL (ref 6–23)
CALCIUM: 7.7 mg/dL — AB (ref 8.4–10.5)
CO2: 25 mEq/L (ref 19–32)
Chloride: 101 mEq/L (ref 96–112)
Creatinine, Ser: 0.64 mg/dL (ref 0.50–1.35)
Glucose, Bld: 128 mg/dL — ABNORMAL HIGH (ref 70–99)
POTASSIUM: 3.6 meq/L — AB (ref 3.7–5.3)
Sodium: 136 mEq/L — ABNORMAL LOW (ref 137–147)

## 2014-04-05 LAB — CBC
HCT: 47.4 % (ref 39.0–52.0)
Hemoglobin: 16.9 g/dL (ref 13.0–17.0)
MCH: 32.8 pg (ref 26.0–34.0)
MCHC: 35.7 g/dL (ref 30.0–36.0)
MCV: 92 fL (ref 78.0–100.0)
PLATELETS: 199 10*3/uL (ref 150–400)
RBC: 5.15 MIL/uL (ref 4.22–5.81)
RDW: 14.5 % (ref 11.5–15.5)
WBC: 20.8 10*3/uL — ABNORMAL HIGH (ref 4.0–10.5)

## 2014-04-05 LAB — STOOL CULTURE

## 2014-04-05 MED ORDER — PNEUMOCOCCAL VAC POLYVALENT 25 MCG/0.5ML IJ INJ: 0.5000 mL | INJECTION | INTRAMUSCULAR | Status: DC | PRN

## 2014-04-05 MED ORDER — PERFLUTREN LIPID MICROSPHERE
1.0000 mL | INTRAVENOUS | Status: AC | PRN
  Administered 2014-04-05: 2 mL via INTRAVENOUS
  Filled 2014-04-05: qty 10

## 2014-04-05 MED ORDER — POTASSIUM CHLORIDE CRYS ER 20 MEQ PO TBCR
40.0000 meq | EXTENDED_RELEASE_TABLET | Freq: Two times a day (BID) | ORAL | Status: AC
  Administered 2014-04-05 – 2014-04-06 (×3): 40 meq via ORAL
  Filled 2014-04-05 (×2): qty 2

## 2014-04-05 NOTE — Progress Notes (Signed)
Subjective: Continues to have back pain particularly with movement.  A little SOB at times.   Objective: Vital signs in last 24 hours: Temp:  [98.1 F (36.7 C)-99.6 F (37.6 C)] 98.2 F (36.8 C) (09/21 0300) Pulse Rate:  [68-129] 88 (09/21 0638) Resp:  [19-27] 20 (09/21 0300) BP: (120-156)/(64-78) 156/72 mmHg (09/21 0638) SpO2:  [90 %-94 %] 94 % (09/21 0638) Last BM Date: 04/01/14  Intake/Output from previous day: 09/20 0701 - 09/21 0700 In: 4444.6 [P.O.:120; I.V.:4324.6] Out: 1525 [Urine:1525] Intake/Output this shift:    Medications Current Facility-Administered Medications  Medication Dose Route Frequency Provider Last Rate Last Dose  . 0.9 %  sodium chloride infusion   Intravenous Continuous Cherene Altes, MD 100 mL/hr at 04/05/14 0400    . amiodarone (NEXTERONE PREMIX) 360 MG/200ML (1.8 mg/mL) IV infusion  30 mg/hr Intravenous Continuous Darlin Coco, MD 16.7 mL/hr at 04/05/14 0200 30 mg/hr at 04/05/14 0200  . antiseptic oral rinse (CPC / CETYLPYRIDINIUM CHLORIDE 0.05%) solution 7 mL  7 mL Mouth Rinse BID Shanda Howells, MD   7 mL at 04/04/14 2245  . apixaban (ELIQUIS) tablet 5 mg  5 mg Oral BID Candee Furbish, MD   5 mg at 04/04/14 2245  . cefTRIAXone (ROCEPHIN) 1 g in dextrose 5 % 50 mL IVPB  1 g Intravenous Q24H Cherene Altes, MD   1 g at 04/04/14 1852  . diltiazem (CARDIZEM) tablet 90 mg  90 mg Oral 4 times per day Darlin Coco, MD   90 mg at 04/05/14 5456  . morphine 2 MG/ML injection 1-2 mg  1-2 mg Intravenous Q2H PRN Cherene Altes, MD   2 mg at 04/04/14 0144  . ondansetron (ZOFRAN) injection 4 mg  4 mg Intravenous Once Kristen N Ward, DO      . ondansetron (ZOFRAN) injection 4 mg  4 mg Intravenous Q6H PRN Cherene Altes, MD      . potassium chloride SA (K-DUR,KLOR-CON) CR tablet 40 mEq  40 mEq Oral BID Cherene Altes, MD   40 mEq at 04/05/14 0003  . senna-docusate (Senokot-S) tablet 1 tablet  1 tablet Oral BID Cherene Altes, MD   1 tablet  at 04/04/14 2345  . sodium chloride 0.9 % injection 3 mL  3 mL Intravenous Q12H Shanda Howells, MD   3 mL at 04/04/14 2245    PE: General appearance: alert, cooperative, mild distress and with sitting up Lungs: Decreased BS and mild crackles bilaterally.  Heart: regular rate and rhythm, S1, S2 normal, no murmur, click, rub or gallop Abdomen: +BS.nontender, distended.  Extremities: Trace LEE Pulses: 2+ and symmetric Skin: Warm and Dry Neurologic: Grossly normal  Lab Results:   Recent Labs  04/03/14 0045 04/04/14 0309 04/05/14 0417  WBC 16.5* 18.2* 20.8*  HGB 16.9 17.5* 16.9  HCT 46.6 49.0 47.4  PLT 86* 129* 199   BMET  Recent Labs  04/03/14 0045 04/04/14 0309 04/05/14 0417  NA 132* 138 136*  K 3.7 3.2* 3.6*  CL 99 101 101  CO2 20 24 25   GLUCOSE 137* 121* 128*  BUN 23 20 21   CREATININE 0.76 0.65 0.64  CALCIUM 7.7* 7.8* 7.7*    Assessment/Plan  Active Problems:   Atrial fibrillation with rapid ventricular response He converted back to NSR at 0058hrs this morning.  On IV amio which was switched back yesterday from PO, switch back to PO tomorrow if he continues in NSR.  Cardizem 90mg  Q6-increased yesterday, eliquis 5BID.  Echo reordered.   Hypokalemia  Being supplemented.     Dehydration  Being hydrated.  +3.0L/+8.6L.   Leukocytosis  Increasing.  On Ceftriaxone. Incentive spirometry ordered.    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 No acute osseous abnormality identified    Transaminitis   Sepsis   Thrombocytopenia, unspecified     LOS: 4 days    HAGER, BRYAN PA-C 04/05/2014 8:24 AM  Patient seen and examined and history reviewed. Agree with above findings and plan. Patient denies any chest pain or SOB. Fortunately he converted to NSR last night. He was treated with  IV amiodarone on admission but had recurrent Afib when switched to po  yesterday. Will continue IV amiodarone today. Echo with contrast just completed. Images still very poor but will await final report.   Lakya Schrupp [place], [place] 04/05/2014 12:46 PM

## 2014-04-05 NOTE — Progress Notes (Signed)
  Echocardiogram 2D Echocardiogram has been performed.  [name] 04/05/2014, 12:53 PM

## 2014-04-05 NOTE — Consult Note (Signed)
[facility] for Infectious Disease  Date of Admission:  04/01/2014  Date of Consult:  04/05/2014  Reason for Consult: Endocarditis Referring Physician: Thereasa Solo  Impression/Recommendation Group B Strep bacteremia Back Pain Afib with RVR Morbid obesity (bmi 40.9)  Would: Check MRI of spine Consider TEE Repeat his BCx  Comment Etiology of his bacteremia is unclear. This is a common bacteria for urine infections. His UA was not overly remarkable. Imaging his heart valves and spine would help to define his duration of anbx.   Thank you so much for this interesting consult,   Bobby Rumpf (pager) [phone] [email or web address]  [name] is an 72 y.o. male.  HPI: 73 yo M with hx of obesity comes [facility] on 9[facility]17 with 3 days of n/v, diarrhea. He fell out of bed and was unable to get up and was down for several hours (? 20h). He was felt to have sepsis, dehydration (HR 140s, WBC 16.6, CK >12,000, and AST 254/ALT 89). He was also found to have afib with RVR. He was started on cipro/flagyl for GU/GI coverage. This was changed to zosyn within 24h due to CT showing stranding around kidney/concern for pyelo.  He has since been noted to have GPC bacteremia. Was started on vanco on 9-18. This has been found to be group B strep. His anbx were changed to ceftriaxone on 9-20.  His WBC is now 20.8.   Past Medical History  Diagnosis Date  . Acute appendicitis with peritoneal abscess 01/28/2013  . Ileus, postoperative 01/28/2013  . Tobacco use disorder 01/28/2013  . Obesity, morbid 01/28/2013  . Shortness of breath     Past Surgical History  Procedure Laterality Date  . Joint replacement      bilateral knee surgery  . Back surgery    . Laparoscopic appendectomy N/A 01/25/2013    Procedure: APPENDECTOMY LAPAROSCOPIC;  Surgeon: Ralene Ok, MD;  Location: [facility];  Service: General;  Laterality: N/A;     No Known Allergies  Medications:  Scheduled: . antiseptic oral  rinse  7 mL Mouth Rinse BID  . apixaban  5 mg Oral BID  . cefTRIAXone (ROCEPHIN)  IV  1 g Intravenous Q24H  . diltiazem  90 mg Oral 4 times per day  . ondansetron  4 mg Intravenous Once  . potassium chloride  40 mEq Oral BID  . senna-docusate  1 tablet Oral BID  . sodium chloride  3 mL Intravenous Q12H    Abtx:  Anti-infectives   Start     Dose/Rate Route Frequency Ordered Stop   04/04/14 1800  cefTRIAXone (ROCEPHIN) 1 g in dextrose 5 % 50 mL IVPB     1 g 100 mL/hr over 30 Minutes Intravenous Every 24 hours 04/04/14 1720     04/02/14 0200  piperacillin-tazobactam (ZOSYN) IVPB 3.375 g  Status:  Discontinued     3.375 g 12.5 mL/hr over 240 Minutes Intravenous 3 times per day 04/02/14 0149 04/04/14 1720   04/01/14 2000  ciprofloxacin (CIPRO) IVPB 400 mg  Status:  Discontinued     400 mg 200 mL/hr over 60 Minutes Intravenous Every 12 hours 04/01/14 1954 04/02/14 0054   04/01/14 2000  metroNIDAZOLE (FLAGYL) IVPB 500 mg  Status:  Discontinued     500 mg 100 mL/hr over 60 Minutes Intravenous Every 8 hours 04/01/14 1954 04/02/14 0054      Total days of antibiotics: 5 9-20 Ceftriaxone  9-18 Zosyn 9-20         Social  History:  reports that he has been smoking Cigarettes.  He has a 75 pack-year smoking history. He does not have any smokeless tobacco history on file. He reports that he does not drink alcohol or use illicit drugs.  History reviewed. No pertinent family history.  General ROS: back for 1 week, lower back. no BM since adm, no other sick persons from his luncheon 1 week ago, no further diarrhea, no n/v. see HPI.   Blood pressure 154/66, pulse 85, temperature 97.6 F (36.4 C), temperature source Oral, resp. rate 22, height '5\' 10"'  (1.778 m), weight 95.4 kg (210 lb 5.1 oz), SpO2 94.00%. General appearance: alert, cooperative and no distress Eyes: negative findings: conjunctivae and sclerae normal and pupils equal, round, reactive to light and accomodation Throat: abnormal  findings: dry, teeth missing.  Neck: no adenopathy and supple, symmetrical, trachea midline Lungs: diminished breath sounds bilaterally Heart: regular rate and rhythm Abdomen: normal findings: soft, non-tender and abnormal findings:  distended and hypoactive bowel sounds Extremities: edema none and thickened nails, no lesions.   Results for orders placed during the hospital encounter of 04/01/14 (from the past 48 hour(s))  COMPREHENSIVE METABOLIC PANEL     Status: Abnormal   Collection Time    04/04/14  3:09 AM      Result Value Ref Range   Sodium 138  137 - 147 mEq/L   Potassium 3.2 (*) 3.7 - 5.3 mEq/L   Chloride 101  96 - 112 mEq/L   CO2 24  19 - 32 mEq/L   Glucose, Bld 121 (*) 70 - 99 mg/dL   BUN 20  6 - 23 mg/dL   Creatinine, Ser 0.65  0.50 - 1.35 mg/dL   Calcium 7.8 (*) 8.4 - 10.5 mg/dL   Total Protein 5.8 (*) 6.0 - 8.3 g/dL   Albumin 2.2 (*) 3.5 - 5.2 g/dL   AST 60 (*) 0 - 37 U/L   ALT 52  0 - 53 U/L   Alkaline Phosphatase 95  39 - 117 U/L   Total Bilirubin 1.7 (*) 0.3 - 1.2 mg/dL   GFR calc non Af Amer >90  >90 mL/min   GFR calc Af Amer >90  >90 mL/min   Comment: (NOTE)     The eGFR has been calculated using the CKD EPI equation.     This calculation has not been validated in all clinical situations.     eGFR's persistently <90 mL/min signify possible Chronic Kidney     Disease.   Anion gap 13  5 - 15  CK     Status: Abnormal   Collection Time    04/04/14  3:09 AM      Result Value Ref Range   Total CK 270 (*) 7 - 232 U/L  MAGNESIUM     Status: None   Collection Time    04/04/14  3:09 AM      Result Value Ref Range   Magnesium 2.1  1.5 - 2.5 mg/dL  PHOSPHORUS     Status: Abnormal   Collection Time    04/04/14  3:09 AM      Result Value Ref Range   Phosphorus 2.0 (*) 2.3 - 4.6 mg/dL  CBC     Status: Abnormal   Collection Time    04/04/14  3:09 AM      Result Value Ref Range   WBC 18.2 (*) 4.0 - 10.5 K/uL   RBC 5.32  4.22 - 5.81 MIL/uL   Hemoglobin 17.5  (*)  13.0 - 17.0 g/dL   HCT 49.0  39.0 - 52.0 %   MCV 92.1  78.0 - 100.0 fL   MCH 32.9  26.0 - 34.0 pg   MCHC 35.7  30.0 - 36.0 g/dL   RDW 14.3  11.5 - 15.5 %   Platelets 129 (*) 150 - 400 K/uL   Comment: REPEATED TO VERIFY  BASIC METABOLIC PANEL     Status: Abnormal   Collection Time    04/05/14  4:17 AM      Result Value Ref Range   Sodium 136 (*) 137 - 147 mEq/L   Potassium 3.6 (*) 3.7 - 5.3 mEq/L   Chloride 101  96 - 112 mEq/L   CO2 25  19 - 32 mEq/L   Glucose, Bld 128 (*) 70 - 99 mg/dL   BUN 21  6 - 23 mg/dL   Creatinine, Ser 0.64  0.50 - 1.35 mg/dL   Calcium 7.7 (*) 8.4 - 10.5 mg/dL   GFR calc non Af Amer >90  >90 mL/min   GFR calc Af Amer >90  >90 mL/min   Comment: (NOTE)     The eGFR has been calculated using the CKD EPI equation.     This calculation has not been validated in all clinical situations.     eGFR's persistently <90 mL/min signify possible Chronic Kidney     Disease.   Anion gap 10  5 - 15  CBC     Status: Abnormal   Collection Time    04/05/14  4:17 AM      Result Value Ref Range   WBC 20.8 (*) 4.0 - 10.5 K/uL   RBC 5.15  4.22 - 5.81 MIL/uL   Hemoglobin 16.9  13.0 - 17.0 g/dL   HCT 47.4  39.0 - 52.0 %   MCV 92.0  78.0 - 100.0 fL   MCH 32.8  26.0 - 34.0 pg   MCHC 35.7  30.0 - 36.0 g/dL   RDW 14.5  11.5 - 15.5 %   Platelets 199  150 - 400 K/uL   Comment: REPEATED TO VERIFY      Component Value Date/Time   SDES STOOL 04/01/2014 2350   SPECREQUEST NONE 04/01/2014 2350   CULT  Value: GROUP B STREP(S.AGALACTIAE)ISOLATED Note: SUSCEPTIBILITIES PERFORMED ON PREVIOUS CULTURE WITHIN THE LAST 5 DAYS. Note: Gram Stain Report Called to,Read Back By and Verified With: BERNADETTE RONCALLO 04/02/14 1400 BY SMITHERSJ Performed at [facility] 04/01/2014 2018   REPTSTATUS 04/03/2014 FINAL 04/01/2014 2350   No results found. Recent Results (from the past 240 hour(s))  URINE CULTURE     Status: None   Collection Time    04/01/14  4:47 PM      Result Value  Ref Range Status   Specimen Description URINE, CATHETERIZED   Final   Special Requests ADDED [id number]   Final   Culture  Setup Time     Final   Value: 04/02/2014 05:58     Performed at [facility]     Final   Value: 1,000 COLONIES/ML     Performed at [facility]   Culture     Final   Value: INSIGNIFICANT GROWTH     Performed at [facility]   Report Status 04/03/2014 FINAL   Final  STOOL CULTURE     Status: None   Collection Time    04/01/14  5:17 PM      Result Value  Ref Range Status   Specimen Description STOOL   Final   Special Requests NONE   Final   Culture     Final   Value: NO SALMONELLA, SHIGELLA, CAMPYLOBACTER, YERSINIA, OR E.COLI 0157:H7 ISOLATED     Performed at [facility]   Report Status 04/05/2014 FINAL   Final  CLOSTRIDIUM DIFFICILE BY PCR     Status: None   Collection Time    04/01/14  5:17 PM      Result Value Ref Range Status   C difficile by pcr NEGATIVE  NEGATIVE Final  CULTURE, BLOOD (ROUTINE X 2)     Status: None   Collection Time    04/01/14  8:10 PM      Result Value Ref Range Status   Specimen Description BLOOD HAND RIGHT   Final   Special Requests BOTTLES DRAWN AEROBIC AND ANAEROBIC 10CC   Final   Culture  Setup Time     Final   Value: 04/02/2014 00:57     Performed at [facility]   Culture     Final   Value: GROUP B STREP(S.AGALACTIAE)ISOLATED     Note: Gram Stain Report Called to,Read Back By and Verified With: BERNADETTE RONCALLO 04/02/14 1400 BY SMITHERSJ     Performed at [facility]   Report Status 04/04/2014 FINAL   Final   Organism ID, Bacteria GROUP B STREP(S.AGALACTIAE)ISOLATED   Final  CULTURE, BLOOD (ROUTINE X 2)     Status: None   Collection Time    04/01/14  8:18 PM      Result Value Ref Range Status   Specimen Description BLOOD HAND RIGHT   Final   Special Requests BOTTLES DRAWN AEROBIC AND ANAEROBIC 5CC   Final   Culture  Setup Time     Final   Value:  04/02/2014 00:56     Performed at [facility]   Culture     Final   Value: GROUP B STREP(S.AGALACTIAE)ISOLATED     Note: SUSCEPTIBILITIES PERFORMED ON PREVIOUS CULTURE WITHIN THE LAST 5 DAYS.     Note: Gram Stain Report Called to,Read Back By and Verified With: BERNADETTE RONCALLO 04/02/14 1400 BY SMITHERSJ     Performed at [facility]   Report Status 04/04/2014 FINAL   Final  MRSA PCR SCREENING     Status: None   Collection Time    04/01/14  9:42 PM      Result Value Ref Range Status   MRSA by PCR NEGATIVE  NEGATIVE Final   Comment:            The GeneXpert MRSA Assay (FDA     approved for NASAL specimens     only), is one component of a     comprehensive MRSA colonization     surveillance program. It is not     intended to diagnose MRSA     infection nor to guide or     monitor treatment for     MRSA infections.      04/05/2014, 3:28 PM     LOS: 4 days

## 2014-04-05 NOTE — Progress Notes (Signed)
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 Fib with RVR. Pt had progressive nausea, vomiting and diarrhea over 2-3 days. Also developed new onset back pain. Was at home and slid down the bed. Was unable to get up from the floor for several hours because of weakness.   In the ER patient noted to be in A. Fib with heart rate into the 140s. Blood pressure in the 100s to 130s. Satting greater than 92% on room air. Notable labs include a white blood cell count 16.6, hemoglobin 19.2, platelets 96, sodium 131, creatinine 1.0, BUN 31, AST 254, ALT 89. Lactate 2.45. CK 12k. Trop 0.09, then <0.3. UA mildly indicative of infection. L. Spine x-ray noted general lumbar degeneration. Chest x-ray within normal limits.  HPI/Subjective: Pt states he feels "better in general."  He denies cp, n/v, or abdom pain.  He denies focal weakness.  He seems to be holding his head in a position flexed to the L. He denies this is a chronic issue, and moves his head to the R somwhat when questioned, but does not appear to have pain in the cervical spine.  He denies photophobia or HA.  Assessment/Plan:  Sepsis due to Goup B Strep - 2/2 blood cx - source unclear Urine cx not convincing - UA equivocal - sepsis physiology improving - cont broad spectrum abx - I have asked ID to weigh in as the source of this infection is not clear - I suspect an MRI of the spine may be indicated given his ongoing back pain, but I will await opinion of ID for now   Lactic acidosis  Resolved w/ volume expansion  Newly diagnosed Afib w/ RVR  Per Cardiology - meds converted to oral, but now back on IV as RVR  recurred - heparin > eliquis - converted back to NSR again last night - agree with keeping on IV meds for now as per Cards plan   Rhabdomyolysis  Ck peak 12k - resolved   Guaiac positive  In setting of diarrhea, significance of this is not clear - Hgb stable / not anemic   Transaminitis likely shock liver - improving w/ hydration   Back pain  Will have to consider if MRI of spine indicated as discussed above, but back pain improved today  Abdom pain - N/V - diarrhea  resolved  Thrombocytopenia Follow   Morbid obesity - Body mass index is 30.18 kg/(m^2).  Code Status: FULL Family Communication: no family present at time of exam Disposition Plan: SDU  Consultants: Cardiology Neurology   Procedures: none  Antibiotics: Zosyn 9/17 > 9/20 Rocephin 9/20 >  DVT prophylaxis: eliquis  Objective: Blood pressure 154/66, pulse 85, temperature 97.6 F (36.4 C), temperature source Oral, resp. rate 22, height 5\' 10"  (1.778 m), weight 95.4 kg (210 lb 5.1 oz), SpO2 94.00%.  Intake/Output Summary (Last 24 hours) at 04/05/14 1314 Last data filed at 04/05/14 0600  Gross per 24 hour  Intake 2746.7 ml  Output    775 ml  Net 1971.7 ml   Exam: General: No acute respiratory distress Lungs: Clear to auscultation bilaterally without wheezes or crackles Cardiovascular: RRR -  w/o gallup or rub - no appreciable M  Abdomen: Nontender, protuberent, soft, bowel sounds positive, no rebound, no ascites, no appreciable mass Extremities: No significant cyanosis, clubbing, or edema bilateral lower extremities  Data Reviewed: Basic Metabolic Panel:  Recent Labs Lab 04/01/14 1414 04/01/14 2018 04/02/14 0253 04/03/14 0045 04/04/14 0309 04/05/14 0417  NA 131*  --  132* 132* 138 136*  K 3.9  --  3.4* 3.7 3.2* 3.6*  CL 90*  --  97 99 101 101  CO2 23  --  20 20 24 25   GLUCOSE 94  --  107* 137* 121* 128*  BUN 31*  --  25* 23 20 21   CREATININE 1.00 0.80 0.72 0.76 0.65 0.64  CALCIUM 8.9   --  7.7* 7.7* 7.8* 7.7*  MG  --   --   --  2.2 2.1  --   PHOS  --   --   --  1.6* 2.0*  --    Liver Function Tests:  Recent Labs Lab 04/01/14 1414 04/02/14 0253 04/03/14 0045 04/04/14 0309  AST 254* 157* 90* 60*  ALT 89* 74* 61* 52  ALKPHOS 80 68 83 95  BILITOT 1.0 0.8 1.1 1.7*  PROT 7.2 6.0 5.9* 5.8*  ALBUMIN 3.2* 2.6* 2.3* 2.2*   Coags:  Recent Labs Lab 04/02/14 0253  INR 1.39   CBC:  Recent Labs Lab 04/01/14 1414 04/01/14 2018 04/02/14 0253 04/03/14 0045 04/04/14 0309 04/05/14 0417  WBC 16.6* 15.2* 14.4* 16.5* 18.2* 20.8*  NEUTROABS 14.3*  --  11.8*  --   --   --   HGB 19.2* 18.3* 16.9 16.9 17.5* 16.9  HCT 53.8* 50.7 47.3 46.6 49.0 47.4  MCV 93.7 91.2 90.8 89.6 92.1 92.0  PLT 96* 95* 98* 86* 129* 199    Cardiac Enzymes:  Recent Labs Lab 04/01/14 1530 04/01/14 2018 04/02/14 0200 04/02/14 0752 04/02/14 0808 04/02/14 1420 04/03/14 0045 04/04/14 0309  CKTOTAL  --  7232* 4522*  --  2528* 69* 1045* 82*  TROPONINI <0.30 <0.30 <0.30 <0.30  --   --   --   --     Recent Results (from the past 240 hour(s))  URINE CULTURE     Status: None   Collection Time    04/01/14  4:47 PM      Result Value Ref Range Status   Specimen Description URINE, CATHETERIZED   Final   Special Requests ADDED [id number]   Final   Culture  Setup Time     Final   Value: 04/02/2014 05:58     Performed at [place] Count     Final   Value: 1,000 COLONIES/ML     Performed at [facility]   Culture     Final   Value: INSIGNIFICANT GROWTH     Performed at [facility]   Report Status 04/03/2014 FINAL   Final  STOOL CULTURE     Status: None   Collection Time    04/01/14  5:17 PM      Result Value Ref Range Status   Specimen Description STOOL   Final   Special Requests NONE   Final   Culture     Final   Value: NO SALMONELLA, SHIGELLA, CAMPYLOBACTER, YERSINIA, OR E.COLI 0157:H7 ISOLATED     Performed at [facility]   Report  Status 04/05/2014 FINAL   Final  CLOSTRIDIUM DIFFICILE BY PCR     Status: None   Collection Time    04/01/14  5:17 PM  Result Value Ref Range Status   C difficile by pcr NEGATIVE  NEGATIVE Final  CULTURE, BLOOD (ROUTINE X 2)     Status: None   Collection Time    04/01/14  8:10 PM      Result Value Ref Range Status   Specimen Description BLOOD HAND RIGHT   Final   Special Requests BOTTLES DRAWN AEROBIC AND ANAEROBIC 10CC   Final   Culture  Setup Time     Final   Value: 04/02/2014 00:57     Performed at [facility]   Culture     Final   Value: GROUP B STREP(S.AGALACTIAE)ISOLATED     Note: Gram Stain Report Called to,Read Back By and Verified With: BERNADETTE RONCALLO 04/02/14 1400 BY SMITHERSJ     Performed at [facility]   Report Status 04/04/2014 FINAL   Final   Organism ID, Bacteria GROUP B STREP(S.AGALACTIAE)ISOLATED   Final  CULTURE, BLOOD (ROUTINE X 2)     Status: None   Collection Time    04/01/14  8:18 PM      Result Value Ref Range Status   Specimen Description BLOOD HAND RIGHT   Final   Special Requests BOTTLES DRAWN AEROBIC AND ANAEROBIC 5CC   Final   Culture  Setup Time     Final   Value: 04/02/2014 00:56     Performed at [facility]   Culture     Final   Value: GROUP B STREP(S.AGALACTIAE)ISOLATED     Note: SUSCEPTIBILITIES PERFORMED ON PREVIOUS CULTURE WITHIN THE LAST 5 DAYS.     Note: Gram Stain Report Called to,Read Back By and Verified With: BERNADETTE RONCALLO 04/02/14 1400 BY SMITHERSJ     Performed at [facility]   Report Status 04/04/2014 FINAL   Final  MRSA PCR SCREENING     Status: None   Collection Time    04/01/14  9:42 PM      Result Value Ref Range Status   MRSA by PCR NEGATIVE  NEGATIVE Final   Comment:            The GeneXpert MRSA Assay (FDA     approved for NASAL specimens     only), is one component of a     comprehensive MRSA colonization     surveillance program. It is not     intended to diagnose MRSA      infection nor to guide or     monitor treatment for     MRSA infections.     Studies:  Recent x-ray studies have been reviewed in detail by the Attending Physician  Scheduled Meds:  Scheduled Meds: . antiseptic oral rinse  7 mL Mouth Rinse BID  . apixaban  5 mg Oral BID  . cefTRIAXone (ROCEPHIN)  IV  1 g Intravenous Q24H  . diltiazem  90 mg Oral 4 times per day  . ondansetron  4 mg Intravenous Once  . potassium chloride  40 mEq Oral BID  . senna-docusate  1 tablet Oral BID  . sodium chloride  3 mL Intravenous Q12H    Time spent on care of this patient: 35 mins   Nayla Dias T , MD   [facility] Office  [phone] Pager - Text Page per Shea Evans as per below:  On-Call/Text Page:      [email or web address]      password [email or web address]  If 7PM-7AM, please contact night-coverage [email or web address] Password [email or web address] 04/05/2014, 1:14 PM   LOS: 4 days

## 2014-04-06 ENCOUNTER — Inpatient Hospital Stay (HOSPITAL_COMMUNITY): Payer: Medicare HMO

## 2014-04-06 DIAGNOSIS — B951 Streptococcus, group B, as the cause of diseases classified elsewhere: Secondary | ICD-10-CM

## 2014-04-06 DIAGNOSIS — L0390 Cellulitis, unspecified: Secondary | ICD-10-CM

## 2014-04-06 DIAGNOSIS — M869 Osteomyelitis, unspecified: Secondary | ICD-10-CM

## 2014-04-06 DIAGNOSIS — E1169 Type 2 diabetes mellitus with other specified complication: Secondary | ICD-10-CM

## 2014-04-06 DIAGNOSIS — L0291 Cutaneous abscess, unspecified: Secondary | ICD-10-CM

## 2014-04-06 DIAGNOSIS — R7881 Bacteremia: Secondary | ICD-10-CM

## 2014-04-06 DIAGNOSIS — M549 Dorsalgia, unspecified: Secondary | ICD-10-CM

## 2014-04-06 DIAGNOSIS — D696 Thrombocytopenia, unspecified: Secondary | ICD-10-CM

## 2014-04-06 DIAGNOSIS — M9080 Osteopathy in diseases classified elsewhere, unspecified site: Secondary | ICD-10-CM

## 2014-04-06 LAB — COMPREHENSIVE METABOLIC PANEL
ALBUMIN: 2.2 g/dL — AB (ref 3.5–5.2)
ALK PHOS: 96 U/L (ref 39–117)
ALT: 42 U/L (ref 0–53)
AST: 41 U/L — ABNORMAL HIGH (ref 0–37)
Anion gap: 10 (ref 5–15)
BILIRUBIN TOTAL: 0.8 mg/dL (ref 0.3–1.2)
BUN: 20 mg/dL (ref 6–23)
CHLORIDE: 105 meq/L (ref 96–112)
CO2: 25 mEq/L (ref 19–32)
Calcium: 7.8 mg/dL — ABNORMAL LOW (ref 8.4–10.5)
Creatinine, Ser: 0.64 mg/dL (ref 0.50–1.35)
GFR calc Af Amer: >90 mL/min (ref >90)
GFR calc non Af Amer: >90 mL/min (ref >90)
Glucose, Bld: 121 mg/dL — ABNORMAL HIGH (ref 70–99)
POTASSIUM: 3.7 meq/L (ref 3.7–5.3)
SODIUM: 140 meq/L (ref 137–147)
Total Protein: 5.6 g/dL — ABNORMAL LOW (ref 6.0–8.3)

## 2014-04-06 LAB — HEPARIN LEVEL (UNFRACTIONATED): HEPARIN UNFRACTIONATED: 1.09 [IU]/mL — AB (ref 0.30–0.70)

## 2014-04-06 LAB — CBC
HCT: 46 % (ref 39.0–52.0)
Hemoglobin: 16.2 g/dL (ref 13.0–17.0)
MCH: 32.9 pg (ref 26.0–34.0)
MCHC: 35.2 g/dL (ref 30.0–36.0)
MCV: 93.5 fL (ref 78.0–100.0)
Platelets: 260 10*3/uL (ref 150–400)
RBC: 4.92 MIL/uL (ref 4.22–5.81)
RDW: 14.9 % (ref 11.5–15.5)
WBC: 17.4 10*3/uL — AB (ref 4.0–10.5)

## 2014-04-06 LAB — APTT: aPTT: 29 seconds (ref 24–37)

## 2014-04-06 LAB — OVA AND PARASITE EXAMINATION: Ova and parasites: NONE SEEN

## 2014-04-06 MED ORDER — HEPARIN (PORCINE) IN NACL 100-0.45 UNIT/ML-% IJ SOLN
1500.0000 [IU]/h | INTRAMUSCULAR | Status: DC
  Administered 2014-04-06: 1300 [IU]/h via INTRAVENOUS
  Administered 2014-04-07: 1500 [IU]/h via INTRAVENOUS
  Filled 2014-04-06 (×2): qty 250

## 2014-04-06 MED ORDER — PIPERACILLIN-TAZOBACTAM 3.375 G IVPB
3.3750 g | Freq: Three times a day (TID) | INTRAVENOUS | Status: DC
  Administered 2014-04-06 – 2014-04-07 (×3): 3.375 g via INTRAVENOUS
  Filled 2014-04-06 (×3): qty 50

## 2014-04-06 MED ORDER — PIPERACILLIN-TAZOBACTAM 3.375 G IVPB 30 MIN
3.3750 g | Freq: Once | INTRAVENOUS | Status: AC
  Administered 2014-04-06: 3.375 g via INTRAVENOUS
  Filled 2014-04-06: qty 50

## 2014-04-06 MED ORDER — HYDRALAZINE HCL 20 MG/ML IJ SOLN
10.0000 mg | Freq: Four times a day (QID) | INTRAMUSCULAR | Status: DC | PRN
  Administered 2014-04-09: 10 mg via INTRAVENOUS
  Filled 2014-04-06: qty 1

## 2014-04-06 MED ORDER — DEXTROSE 5 % IV SOLN
2.0000 g | INTRAVENOUS | Status: DC
  Filled 2014-04-06: qty 2

## 2014-04-06 MED ORDER — GADOBENATE DIMEGLUMINE 529 MG/ML IV SOLN
20.0000 mL | Freq: Once | INTRAVENOUS | Status: AC | PRN
  Administered 2014-04-06: 20 mL via INTRAVENOUS

## 2014-04-06 MED ORDER — VANCOMYCIN HCL IN DEXTROSE 1-5 GM/200ML-% IV SOLN
1000.0000 mg | Freq: Three times a day (TID) | INTRAVENOUS | Status: DC
  Administered 2014-04-06 – 2014-04-07 (×2): 1000 mg via INTRAVENOUS
  Filled 2014-04-06 (×4): qty 200

## 2014-04-06 MED ORDER — AMIODARONE HCL 200 MG PO TABS
400.0000 mg | ORAL_TABLET | Freq: Two times a day (BID) | ORAL | Status: AC
  Administered 2014-04-06 – 2014-04-12 (×13): 400 mg via ORAL
  Filled 2014-04-06 (×14): qty 2

## 2014-04-06 NOTE — Progress Notes (Signed)
TEE scheduled for tomorrow by Dr. Marlou Porch for group B strep bacteremia. Orders placed, NPO past midnight  Hilbert Corrigan PA Pager: [phone]

## 2014-04-06 NOTE — Progress Notes (Signed)
Patient Name: [name] Date of Encounter: 04/06/2014     Active Problems:   Dehydration   Leukocytosis   Atrial fibrillation with rapid ventricular response   Abdominal pain, generalized   Nausea vomiting and diarrhea   Back pain   Transaminitis   Sepsis   Thrombocytopenia, unspecified    SUBJECTIVE  Denies any CP or SOB. Continue to have back pain.  CURRENT MEDS . antiseptic oral rinse  7 mL Mouth Rinse BID  . apixaban  5 mg Oral BID  . cefTRIAXone (ROCEPHIN)  IV  1 g Intravenous Q24H  . diltiazem  90 mg Oral 4 times per day  . ondansetron  4 mg Intravenous Once  . senna-docusate  1 tablet Oral BID  . sodium chloride  3 mL Intravenous Q12H    OBJECTIVE  Filed Vitals:   04/06/14 0016 04/06/14 0413 04/06/14 0700 04/06/14 0749  BP: 176/73 178/77 144/69   Pulse:    83  Temp: 98 F (36.7 C) 98 F (36.7 C)  97.4 F (36.3 C)  TempSrc: Oral Oral  Oral  Resp:    24  Height:      Weight:  209 lb 7 oz (95 kg)    SpO2: 92%   90%    Intake/Output Summary (Last 24 hours) at 04/06/14 1058 Last data filed at 04/06/14 0942  Gross per 24 hour  Intake 2313.43 ml  Output   1675 ml  Net 638.43 ml   Filed Weights   04/03/14 0433 04/04/14 0406 04/06/14 0413  Weight: 210 lb 5.1 oz (95.4 kg) 210 lb 5.1 oz (95.4 kg) 209 lb 7 oz (95 kg)    PHYSICAL EXAM  General: Pleasant, NAD. Neuro: Alert and oriented X 3. Moves all extremities spontaneously. Psych: Normal affect. HEENT:  Normal  Neck: Supple without bruits or JVD. Lungs:  Resp regular and unlabored, CTA. Heart: RRR no s3, s4, or murmurs. Abdomen: Soft, non-tender. +very distended, nontender. Folley in place Extremities: No clubbing, cyanosis or edema. DP/PT/Radials 2+ and equal bilaterally.  Accessory Clinical Findings  CBC  Recent Labs  04/05/14 0417 04/06/14 0302  WBC 20.8* 17.4*  HGB 16.9 16.2  HCT 47.4 46.0  MCV 92.0 93.5  PLT 199 161   Basic Metabolic Panel  Recent Labs  04/04/14 0309  04/05/14 0417 04/06/14 0302  NA 138 136* 140  K 3.2* 3.6* 3.7  CL 101 101 105  CO2 24 25 25   GLUCOSE 121* 128* 121*  BUN 20 21 20   CREATININE 0.65 0.64 0.64  CALCIUM 7.8* 7.7* 7.8*  MG 2.1  --   --   PHOS 2.0*  --   --    Liver Function Tests  Recent Labs  04/04/14 0309 04/06/14 0302  AST 60* 41*  ALT 52 42  ALKPHOS 95 96  BILITOT 1.7* 0.8  PROT 5.8* 5.6*  ALBUMIN 2.2* 2.2*   Cardiac Enzymes  Recent Labs  04/04/14 0309  CKTOTAL 270*    TELE NSR with HR 70-80s, no significant ventricular ectopy    ECG  No new EKG  Echocardiogram  04/03/2014 Study Conclusions  - Left ventricle: The cavity size was normal. The study is not technically sufficient to allow evaluation of LV diastolic function. LVEF is probably normal. Wall motion cannot be analyzed. - Left atrium: The atrium was mildly to moderately dilated. - Very limited study due to obesity and extreme tachycardia. The study should be repeated with microbubble contrast when the heart rate is better  controlled.      Radiology/Studies  Dg Chest 2 View  04/01/2014   CLINICAL DATA:  Shortness of breath.  Cough.  EXAM: CHEST  2 VIEW  COMPARISON:  CT scan dated 08/23/2005  FINDINGS: Heart size and pulmonary vascularity are normal. Lungs are clear. No acute osseous abnormality. Prominent right first costochondral junction.  IMPRESSION: No active cardiopulmonary disease.   Electronically Signed   By: Rozetta Nunnery M.D.   On: 04/01/2014 17:25   Dg Lumbar Spine Complete  04/01/2014   CLINICAL DATA:  Low back pain.  EXAM: LUMBAR SPINE - COMPLETE 4+ VIEW  COMPARISON:  CT abdomen and pelvis 01/25/2013  FINDINGS: Mild lumbar levoscoliosis his again seen with severe asymmetric right-sided disc space narrowing at L2-3 and L3-4 with associated vertebral body sclerosis and bridging osteophytes. 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 Trace retrolisthesis of L2 on L3 is unchanged and likely degenerative.  There is no evidence of compression fracture. Atherosclerotic aortic calcification is noted.  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 No acute osseous abnormality identified.   Electronically Signed   By: Logan Bores   On: 04/01/2014 17:27   Ct Abdomen Pelvis W Contrast  04/01/2014   CLINICAL DATA:  Lower back pain and generalized weakness. Vomiting and anorexia.  EXAM: CT ABDOMEN AND PELVIS WITH CONTRAST  TECHNIQUE: Multidetector CT imaging of the abdomen and pelvis was performed using the standard protocol following bolus administration of intravenous contrast.  CONTRAST:  137mL OMNIPAQUE IOHEXOL 300 MG/ML  SOLN  COMPARISON:  CT of the abdomen and pelvis from 01/25/2013  FINDINGS: Minimal bibasilar atelectasis is noted. Scattered coronary artery calcifications are seen.  A stable 2.6 cm hypodensity within the left hepatic lobe likely reflects a cyst. The liver and spleen are otherwise unremarkable. The gallbladder is borderline normal in size, and grossly unremarkable in appearance. The pancreas and adrenal glands are unremarkable.  Diffuse perinephric stranding is noted. As this is new from the prior study, it raises concern for mild diffuse bilateral pyelonephritis.  A 9 mm cyst is noted at the upper pole of the right kidney. Scattered vascular calcifications are noted at the right renal hilum. The kidneys are otherwise unremarkable. There is no evidence of hydronephrosis. No renal or ureteral stones are identified.  Trace free fluid is seen tracking along the mid to distal left ureter, at the left lower quadrant.  The small bowel is unremarkable in appearance. The stomach is within normal limits. No acute vascular abnormalities are seen.  Indication is status post appendectomy. Contrast progresses to the level of the sigmoid colon. The colon is unremarkable in appearance.  The bladder is mildly distended and grossly unremarkable. The prostate is mildly enlarged,  measuring 4.9 cm in transverse dimension. No inguinal lymphadenopathy is seen.  No acute osseous abnormalities are identified. Multilevel vacuum phenomenon, endplate sclerotic change and disc space narrowing are noted along the lumbar spine.  IMPRESSION: 1. Diffuse perinephric stranding, new from the prior study. This raises concern for mild diffuse bilateral pyelonephritis. Would correlate for associated symptoms. 2. Small right renal cyst and likely hepatic cyst. 3. Scattered coronary artery calcifications seen. 4. Mildly enlarged prostate. 5. Mild degenerative change noted along the lumbar spine.   Electronically Signed   By: Garald Balding M.D.   On: 04/01/2014 21:50    ASSESSMENT AND PLAN  1. A-fib with RVR  - converted to NSR on amio, will switch to 400mg  BID for 1 week, then 200mg  BID  afterward  - continue diltiazem 90mg  Q6hr, eliquis 5mg  BID  - Echo 04/03/2014 EF normal, LA mild to moderately dilated, recommended repeat with microbubble contrast. Echo repeated on 9/21 poor image  2. Bacteremia: ID on board, consider TEE for assess heart valve, and guide duration of abx. Afebrile, low suspicion for endocarditis, however will discuss with MD  3. N/V and diarrhea with dehydration: hydrated  4. 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 No acute osseous abnormality identified   5. Leukocytosis: secondary to #2  - improving on abx  6. HTN: unclear if due to back pain vs essential HTN  - no h/o HTN, plan for PRN hydralazine for SBP >160 for now  Signed, Woodward Ku Pager: [phone] Patient seen and examined and history reviewed. Agree with above findings and plan. Feeling better. Still with back pain. Afebrile. Blood cultures positive for Strep. Will switch amiodarone to po today. Can switch to long acting diltiazem. Agree with TEE to evaluate for SBE in setting of bacteremia. Transthoracic Echo unable to assess valvular structures.   Tadan Shill [place],  [place] 04/06/2014 12:28 PM

## 2014-04-06 NOTE — Progress Notes (Signed)
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   Medical History: Past Medical History  Diagnosis Date  . Acute appendicitis with peritoneal abscess 01/28/2013  . Ileus, postoperative 01/28/2013  . Tobacco use disorder 01/28/2013  . Obesity, morbid 01/28/2013  . 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  On Rocephin 9/20>>9/22. Wt 95 kg, creat cl 99 ml/min. WBC 17.4  Pharmacy consulted to start bridge therapy with heparin drip for afib.  Patient has been on on Eliquis (last dose 0 9:30 today) possible surgery within the next 24-48 hours.   Goal of Therapy: Heparin level 0.3-0.7 units/ml Monitor platelets by anticoagulation protocol: Yes Vancomycin trough 15-20 mcg/ml   Labs:  Recent Labs  04/04/14 0309 04/05/14 0417 04/06/14 0302  WBC 18.2* 20.8* 17.4*  HGB 17.5* 16.9 16.2  PLT 129* 199 260  CREATININE 0.65 0.64 0.64   Estimated Creatinine Clearance: 99.4 ml/min (by C-G formula based on Cr of 0.64). No results found for this basename: Letta Median, VANCORANDOM, GENTTROUGH, GENTPEAK, GENTRANDOM, TOBRATROUGH, TOBRAPEAK, TOBRARND, AMIKACINPEAK, AMIKACINTROU,  AMIKACIN,  in the last 72 hours   Microbiology: Recent Results (from the past 720 hour(s))  URINE CULTURE     Status: None   Collection Time    04/01/14  4:47 PM      Result Value Ref Range Status   Specimen Description URINE, CATHETERIZED   Final   Special Requests ADDED [id number]   Final   Culture  Setup Time     Final   Value: 04/02/2014 05:58     Performed at [place]     Final   Value: 1,000 COLONIES/ML     Performed at [facility]   Culture     Final   Value: INSIGNIFICANT GROWTH     Performed at [facility]   Report Status 04/03/2014 FINAL   Final  STOOL CULTURE     Status: None   Collection Time    04/01/14  5:17 PM      Result Value Ref Range Status   Specimen Description STOOL   Final   Special Requests NONE   Final   Culture     Final   Value: NO SALMONELLA, SHIGELLA, CAMPYLOBACTER, YERSINIA, OR E.COLI 0157:H7 ISOLATED     Performed at [facility]   Report Status 04/05/2014 FINAL   Final  CLOSTRIDIUM DIFFICILE BY PCR     Status: None   Collection Time    04/01/14  5:17 PM      Result Value Ref Range Status   C difficile by pcr NEGATIVE  NEGATIVE Final  CULTURE,  BLOOD (ROUTINE X 2)     Status: None   Collection Time    04/01/14  8:10 PM      Result Value Ref Range Status   Specimen Description BLOOD HAND RIGHT   Final   Special Requests BOTTLES DRAWN AEROBIC AND ANAEROBIC 10CC   Final   Culture  Setup Time     Final   Value: 04/02/2014 00:57     Performed at [facility]   Culture     Final   Value: GROUP B STREP(S.AGALACTIAE)ISOLATED     Note: Gram Stain Report Called to,Read Back By and Verified With: BERNADETTE RONCALLO 04/02/14 1400 BY SMITHERSJ     Performed at [facility]   Report Status 04/04/2014 FINAL   Final   Organism ID, Bacteria GROUP B STREP(S.AGALACTIAE)ISOLATED   Final  CULTURE, BLOOD (ROUTINE X 2)     Status: None   Collection Time    04/01/14  8:18 PM      Result  Value Ref Range Status   Specimen Description BLOOD HAND RIGHT   Final   Special Requests BOTTLES DRAWN AEROBIC AND ANAEROBIC 5CC   Final   Culture  Setup Time     Final   Value: 04/02/2014 00:56     Performed at [facility]   Culture     Final   Value: GROUP B STREP(S.AGALACTIAE)ISOLATED     Note: SUSCEPTIBILITIES PERFORMED ON PREVIOUS CULTURE WITHIN THE LAST 5 DAYS.     Note: Gram Stain Report Called to,Read Back By and Verified With: BERNADETTE RONCALLO 04/02/14 1400 BY SMITHERSJ     Performed at [facility]   Report Status 04/04/2014 FINAL   Final  MRSA PCR SCREENING     Status: None   Collection Time    04/01/14  9:42 PM      Result Value Ref Range Status   MRSA by PCR NEGATIVE  NEGATIVE Final   Comment:            The GeneXpert MRSA Assay (FDA     approved for NASAL specimens     only), is one component of a     comprehensive MRSA colonization     surveillance program. It is not     intended to diagnose MRSA     infection nor to guide or     monitor treatment for     MRSA infections.  OVA AND PARASITE EXAMINATION     Status: None   Collection Time    04/01/14 11:50 PM      Result Value Ref Range Status   Specimen Description STOOL   Final   Special Requests NONE   Final   Ova and parasites     Final   Value: NO OVA OR PARASITES SEEN     Performed at [facility]   Report Status 04/06/2014 FINAL   Final    Plan: -get baseline aPTT and HL now, as HL will be skewed by apixaban and we will dose with aPTTs -start heparin tonight at 2200 at rate of 1300 units/hr and check 8 hr HL at 0600 am -vancomycin 1 gm IV q8h -zosyn 3.375 gm IV x 1 dose over 30 minutes then zosyn 3.375 gm IV q8h, infuse each dose over 4 hours -check steady-state vancomycin trough as needed  Eudelia Bunch, Pharm.D. [phone] 04/06/2014 6:41 PM

## 2014-04-06 NOTE — Progress Notes (Signed)
INFECTIOUS DISEASE PROGRESS NOTE  ID: [name] is a 73 y.o. male with  Active Problems:   Dehydration   Leukocytosis   Atrial fibrillation with rapid ventricular response   Abdominal pain, generalized   Nausea vomiting and diarrhea   Back pain   Transaminitis   Sepsis   Thrombocytopenia, unspecified  Subjective: Resting in bed, no compliants  Abtx:  Anti-infectives   Start     Dose/Rate Route Frequency Ordered Stop   04/04/14 1800  cefTRIAXone (ROCEPHIN) 1 g in dextrose 5 % 50 mL IVPB     1 g 100 mL/hr over 30 Minutes Intravenous Every 24 hours 04/04/14 1720     04/02/14 0200  piperacillin-tazobactam (ZOSYN) IVPB 3.375 g  Status:  Discontinued     3.375 g 12.5 mL/hr over 240 Minutes Intravenous 3 times per day 04/02/14 0149 04/04/14 1720   04/01/14 2000  ciprofloxacin (CIPRO) IVPB 400 mg  Status:  Discontinued     400 mg 200 mL/hr over 60 Minutes Intravenous Every 12 hours 04/01/14 1954 04/02/14 0054   04/01/14 2000  metroNIDAZOLE (FLAGYL) IVPB 500 mg  Status:  Discontinued     500 mg 100 mL/hr over 60 Minutes Intravenous Every 8 hours 04/01/14 1954 04/02/14 0054      Medications:  Scheduled: . antiseptic oral rinse  7 mL Mouth Rinse BID  . apixaban  5 mg Oral BID  . cefTRIAXone (ROCEPHIN)  IV  1 g Intravenous Q24H  . diltiazem  90 mg Oral 4 times per day  . ondansetron  4 mg Intravenous Once  . senna-docusate  1 tablet Oral BID  . sodium chloride  3 mL Intravenous Q12H    Objective: Vital signs in last 24 hours: Temp:  [97.4 F (36.3 C)-98.1 F (36.7 C)] 97.4 F (36.3 C) (09/22 0749) Pulse Rate:  [76-85] 83 (09/22 0749) Resp:  [22-31] 24 (09/22 0749) BP: (144-198)/(62-85) 144/69 mmHg (09/22 0700) SpO2:  [90 %-94 %] 90 % (09/22 0749) Weight:  [95 kg (209 lb 7 oz)] 95 kg (209 lb 7 oz) (09/22 0413)   General appearance: alert, cooperative and no distress Resp: clear to auscultation bilaterally Cardio: regular rate and rhythm GI: normal findings:  bowel sounds normal and soft, non-tender  Lab Results  Recent Labs  04/05/14 0417 04/06/14 0302  WBC 20.8* 17.4*  HGB 16.9 16.2  HCT 47.4 46.0  NA 136* 140  K 3.6* 3.7  CL 101 105  CO2 25 25  BUN 21 20  CREATININE 0.64 0.64   Liver Panel  Recent Labs  04/04/14 0309 04/06/14 0302  PROT 5.8* 5.6*  ALBUMIN 2.2* 2.2*  AST 60* 41*  ALT 52 42  ALKPHOS 95 96  BILITOT 1.7* 0.8   Sedimentation Rate No results found for this basename: ESRSEDRATE,  in the last 72 hours C-Reactive Protein No results found for this basename: CRP,  in the last 72 hours  Microbiology: Recent Results (from the past 240 hour(s))  URINE CULTURE     Status: None   Collection Time    04/01/14  4:47 PM      Result Value Ref Range Status   Specimen Description URINE, CATHETERIZED   Final   Special Requests ADDED [id number]   Final   Culture  Setup Time     Final   Value: 04/02/2014 05:58     Performed at [facility]     Final   Value: 1,000 COLONIES/ML  Performed at [facility]     Final   Value: INSIGNIFICANT GROWTH     Performed at [facility]   Report Status 04/03/2014 FINAL   Final  STOOL CULTURE     Status: None   Collection Time    04/01/14  5:17 PM      Result Value Ref Range Status   Specimen Description STOOL   Final   Special Requests NONE   Final   Culture     Final   Value: NO SALMONELLA, SHIGELLA, CAMPYLOBACTER, YERSINIA, OR E.COLI 0157:H7 ISOLATED     Performed at [facility]   Report Status 04/05/2014 FINAL   Final  CLOSTRIDIUM DIFFICILE BY PCR     Status: None   Collection Time    04/01/14  5:17 PM      Result Value Ref Range Status   C difficile by pcr NEGATIVE  NEGATIVE Final  CULTURE, BLOOD (ROUTINE X 2)     Status: None   Collection Time    04/01/14  8:10 PM      Result Value Ref Range Status   Specimen Description BLOOD HAND RIGHT   Final   Special Requests BOTTLES DRAWN AEROBIC AND ANAEROBIC 10CC    Final   Culture  Setup Time     Final   Value: 04/02/2014 00:57     Performed at [facility]   Culture     Final   Value: GROUP B STREP(S.AGALACTIAE)ISOLATED     Note: Gram Stain Report Called to,Read Back By and Verified With: BERNADETTE RONCALLO 04/02/14 1400 BY SMITHERSJ     Performed at [facility]   Report Status 04/04/2014 FINAL   Final   Organism ID, Bacteria GROUP B STREP(S.AGALACTIAE)ISOLATED   Final  CULTURE, BLOOD (ROUTINE X 2)     Status: None   Collection Time    04/01/14  8:18 PM      Result Value Ref Range Status   Specimen Description BLOOD HAND RIGHT   Final   Special Requests BOTTLES DRAWN AEROBIC AND ANAEROBIC 5CC   Final   Culture  Setup Time     Final   Value: 04/02/2014 00:56     Performed at [facility]   Culture     Final   Value: GROUP B STREP(S.AGALACTIAE)ISOLATED     Note: SUSCEPTIBILITIES PERFORMED ON PREVIOUS CULTURE WITHIN THE LAST 5 DAYS.     Note: Gram Stain Report Called to,Read Back By and Verified With: BERNADETTE RONCALLO 04/02/14 1400 BY SMITHERSJ     Performed at [facility]   Report Status 04/04/2014 FINAL   Final  MRSA PCR SCREENING     Status: None   Collection Time    04/01/14  9:42 PM      Result Value Ref Range Status   MRSA by PCR NEGATIVE  NEGATIVE Final   Comment:            The GeneXpert MRSA Assay (FDA     approved for NASAL specimens     only), is one component of a     comprehensive MRSA colonization     surveillance program. It is not     intended to diagnose MRSA     infection nor to guide or     monitor treatment for     MRSA infections.  OVA AND PARASITE EXAMINATION     Status: None   Collection Time    04/01/14 11:50  PM      Result Value Ref Range Status   Specimen Description STOOL   Final   Special Requests NONE   Final   Ova and parasites     Final   Value: NO OVA OR PARASITES SEEN     Performed at [facility]   Report Status 04/06/2014 FINAL   Final     Studies/Results: No results found.   Assessment/Plan: Group B Strep bacteremia  Back Pain  Afib with RVR Morbid obesity (bmi 40.9)  Total days of antibiotics: 6  9-20 Ceftriaxone  9-18 Zosyn 9-20  Will continue ceftriaxone Await repeat BCx Await MRI of spine Consider TEE          Bobby Rumpf Infectious Diseases (pager) [phone] [email or web address] 04/06/2014, 11:10 AM  LOS: 5 days

## 2014-04-06 NOTE — Progress Notes (Signed)
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 Appendectomy July 2014, lumbar degenerative disease s/p lumbar surgery  X 3 Last surgery 2009.  Presented with nausea/vomiting/diarrhea, back pain, and A. Fib with RVR. Pt had progressive nausea, vomiting and diarrhea over 2-3 days. Also developed new onset back pain. Was at home and slid down the bed. Was unable to get up from the floor for several hours because of weakness.  In the ER patient noted to be in A. Fib with heart rate into the 140s. Blood pressure in the 100s to 130s. Satting greater than 92% on room air. Notable labs include a white blood cell count 16.6, hemoglobin 19.2, platelets 96, sodium 131, creatinine 1.0, BUN 31, AST 254, ALT 89. Lactate 2.45. CK 12k. Trop 0.09, then <0.3. UA mildly indicative of infection. L. Spine x-ray noted general lumbar degeneration. Chest x-ray within normal limits.   HPI/Subjective: 9/22 states continues to have back pain especially when in the seated position. States has not had this pain post surgery in 2009. Negative diaphoresis, negative N./V. States when he slid to floor prior to admission was on floor for approximately 20 hours until his friend arrived with a locksmith to open his door.  Assessment/Plan: Sepsis due to Goup B Strep - 2/2 blood cx - source unclear  -Urine cx not convincing - UA equivocal -spoke with Dr. Logan Bores (radiology), patient will require neurosurgery consult. Secondary to findings in the L-spine; see MRI results below -Restart Zosyn, and vancomycin consult infectious disease see patient in a.m.  Lactic acidosis  -Resolved   Newly diagnosed Afib w/ RVR  -Currently in NSR  -DC Eliquis; until neurosurgery consulted in a.m. (L-spine I&D?)  -Start heparin gtt per pharmacy  Rhabdomyolysis    -Ck peak 12k ,resolved   Guaiac positive  -In setting of diarrhea, significance of this is not clear  - Hgb stable / not anemic   Transaminitis  -likely shock liver, resolved    Osteomyelitis/phlegmon (Back pain)  -L-spine MRI shows, extensive infection? Consult neurosurgery in the a.m. Will have to consider if MRI -continue back pain consistent with infection  Abdom pain - N/V - diarrhea  -resolved   Thrombocytopenia  -Resolved    Morbid obesity - Body mass index is 30.18 kg/(m^2).    Code Status: FULL  Family Communication: no family present at time of exam  Disposition Plan: SDU    Consultants: Dr. Peter Martinique (Cardiology)  Dr. Bobby Rumpf (infectious disease)  Dr. Roland Rack (Neurology)     Procedure/Significant Events: 9/22 T-spine/L-spine MRI - Prominent fluid signal L2-3 to L5-S1 disc spaces with marrow edema suspicious for discitis/osteomyelitis specifically at L2-3 and L4-5.  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  - Facet joint fluid at L1-2, L2-3, and L3-4 chronic facet arthritis vs septic arthritis particularly at L2-3.     Culture 9/17 blood right hand x2 positive GROUP B STREP(S.AGALACTIAE) 9/17 stool negative 9/17 stool negative O&P 9/17 C. difficile by PCR negative 9/17 urine negative 9/21 blood pending   Antibiotics: Rocephin 9/20 > stopped 9/22 Zosyn 9/17 > 9/20 ; restarted 9/22>> Vancomycin 9/22>>    DVT prophylaxis: Eliquis   Devices NA   LINES / TUBES:      Continuous Infusions: .  sodium chloride 75 mL/hr at 04/05/14 2256  . amiodarone 30 mg/hr (04/05/14 2359)    Objective: VITAL SIGNS: Temp: 97.4 F (36.3 C) (09/22 0749) Temp src: Oral (09/22 0749) BP: 144/69 mmHg (09/22 0700) Pulse Rate: 83 (09/22 0749) SPO2; FIO2:   Intake/Output Summary (Last 24 hours) at 04/06/14 1029 Last data  filed at 04/06/14 7824  Gross per 24 hour  Intake 2313.43 ml  Output   1675 ml  Net 638.43 ml     Exam: General: A./O. x4, continued L-spine back pain especially with motion of his lower extremities or sitting on the edge of the bed, No acute respiratory distress Lungs: Clear to auscultation bilaterally without wheezes or crackles Cardiovascular: Regular rate and rhythm without murmur gallop or rub normal S1 and S2 Abdomen: Nontender, nondistended, soft, bowel sounds positive, no rebound, no ascites, no appreciable mass, pain to palpation left CVA area  Extremities: No significant cyanosis, clubbing, or edema bilateral lower extremities Neurologic; pupils equal reactive to light and accommodation, tongue/uvula midline, upper extremity strength 5/5 bilateral, bilateral lower extremity strength 4/5 (secondary to pain?), Sensation intact throughout, did not ambulate patient secondary to lower extremity weakness/L-spine pain.  Data Reviewed: Basic Metabolic Panel:  Recent Labs Lab 04/02/14 0253 04/03/14 0045 04/04/14 0309 04/05/14 0417 04/06/14 0302  NA 132* 132* 138 136* 140  K 3.4* 3.7 3.2* 3.6* 3.7  CL 97 99 101 101 105  CO2 20 20 24 25 25   GLUCOSE 107* 137* 121* 128* 121*  BUN 25* 23 20 21 20   CREATININE 0.72 0.76 0.65 0.64 0.64  CALCIUM 7.7* 7.7* 7.8* 7.7* 7.8*  MG  --  2.2 2.1  --   --   PHOS  --  1.6* 2.0*  --   --    Liver Function Tests:  Recent Labs Lab 04/01/14 1414 04/02/14 0253 04/03/14 0045 04/04/14 0309 04/06/14 0302  AST 254* 157* 90* 60* 41*  ALT 89* 74* 61* 52 42  ALKPHOS 80 68 83 95 96  BILITOT 1.0 0.8 1.1 1.7* 0.8  PROT 7.2 6.0 5.9* 5.8* 5.6*  ALBUMIN 3.2* 2.6* 2.3* 2.2* 2.2*   No results found for this basename: LIPASE, AMYLASE,  in the last 168 hours No results found for this basename: AMMONIA,  in the last 168 hours CBC:  Recent Labs Lab 04/01/14 1414  04/02/14 0253 04/03/14 0045 04/04/14 0309 04/05/14 0417 04/06/14 0302  WBC 16.6*   < > 14.4* 16.5* 18.2* 20.8* 17.4*  NEUTROABS 14.3*  --  11.8*  --   --   --   --   HGB 19.2*  < > 16.9 16.9 17.5* 16.9 16.2  HCT 53.8*  < > 47.3 46.6 49.0 47.4 46.0  MCV 93.7  < > 90.8 89.6 92.1 92.0 93.5  PLT 96*  < > 98* 86* 129* 199 260  < > = values in this interval not displayed. Cardiac Enzymes:  Recent Labs Lab 04/01/14 1530 04/01/14 2018 04/02/14 0200 04/02/14 0752 04/02/14 0808 04/02/14 1420 04/03/14 0045 04/04/14 0309  CKTOTAL  --  7232* 4522*  --  2353* 1550* 1045* 270*  TROPONINI <0.30 <0.30 <0.30 <0.30  --   --   --   --    BNP (last 3 results)  Recent Labs  04/01/14 2018  PROBNP 2858.0*   CBG: No results found for this basename: GLUCAP,  in the last 168 hours  Recent Results (from the past 240 hour(s))  URINE CULTURE     Status: None  Collection Time    04/01/14  4:47 PM      Result Value Ref Range Status   Specimen Description URINE, CATHETERIZED   Final   Special Requests ADDED [id number]   Final   Culture  Setup Time     Final   Value: 04/02/2014 05:58     Performed at [facility]     Final   Value: 1,000 COLONIES/ML     Performed at [facility]   Culture     Final   Value: INSIGNIFICANT GROWTH     Performed at [facility]   Report Status 04/03/2014 FINAL   Final  STOOL CULTURE     Status: None   Collection Time    04/01/14  5:17 PM      Result Value Ref Range Status   Specimen Description STOOL   Final   Special Requests NONE   Final   Culture     Final   Value: NO SALMONELLA, SHIGELLA, CAMPYLOBACTER, YERSINIA, OR E.COLI 0157:H7 ISOLATED     Performed at [facility]   Report Status 04/05/2014 FINAL   Final  CLOSTRIDIUM DIFFICILE BY PCR     Status: None   Collection Time    04/01/14  5:17 PM      Result Value Ref Range Status   C difficile by pcr NEGATIVE  NEGATIVE Final  CULTURE, BLOOD (ROUTINE X 2)     Status: None   Collection Time    04/01/14  8:10 PM      Result Value Ref  Range Status   Specimen Description BLOOD HAND RIGHT   Final   Special Requests BOTTLES DRAWN AEROBIC AND ANAEROBIC 10CC   Final   Culture  Setup Time     Final   Value: 04/02/2014 00:57     Performed at [facility]   Culture     Final   Value: GROUP B STREP(S.AGALACTIAE)ISOLATED     Note: Gram Stain Report Called to,Read Back By and Verified With: BERNADETTE RONCALLO 04/02/14 1400 BY SMITHERSJ     Performed at [facility]   Report Status 04/04/2014 FINAL   Final   Organism ID, Bacteria GROUP B STREP(S.AGALACTIAE)ISOLATED   Final  CULTURE, BLOOD (ROUTINE X 2)     Status: None   Collection Time    04/01/14  8:18 PM      Result Value Ref Range Status   Specimen Description BLOOD HAND RIGHT   Final   Special Requests BOTTLES DRAWN AEROBIC AND ANAEROBIC 5CC   Final   Culture  Setup Time     Final   Value: 04/02/2014 00:56     Performed at [facility]   Culture     Final   Value: GROUP B STREP(S.AGALACTIAE)ISOLATED     Note: SUSCEPTIBILITIES PERFORMED ON PREVIOUS CULTURE WITHIN THE LAST 5 DAYS.     Note: Gram Stain Report Called to,Read Back By and Verified With: BERNADETTE RONCALLO 04/02/14 1400 BY SMITHERSJ     Performed at [facility]   Report Status 04/04/2014 FINAL   Final  MRSA PCR SCREENING     Status: None   Collection Time    04/01/14  9:42 PM      Result Value Ref Range Status   MRSA by PCR NEGATIVE  NEGATIVE Final   Comment:            The GeneXpert MRSA Assay (FDA  approved for NASAL specimens     only), is one component of a     comprehensive MRSA colonization     surveillance program. It is not     intended to diagnose MRSA     infection nor to guide or     monitor treatment for     MRSA infections.  OVA AND PARASITE EXAMINATION     Status: None   Collection Time    04/01/14 11:50 PM      Result Value Ref Range Status   Specimen Description STOOL   Final   Special Requests NONE   Final   Ova and parasites     Final    Value: NO OVA OR PARASITES SEEN     Performed at [facility]   Report Status 04/06/2014 FINAL   Final     Studies:  Recent x-ray studies have been reviewed in detail by the Attending Physician  Scheduled Meds:  Scheduled Meds: . antiseptic oral rinse  7 mL Mouth Rinse BID  . apixaban  5 mg Oral BID  . cefTRIAXone (ROCEPHIN)  IV  1 g Intravenous Q24H  . diltiazem  90 mg Oral 4 times per day  . ondansetron  4 mg Intravenous Once  . senna-docusate  1 tablet Oral BID  . sodium chloride  3 mL Intravenous Q12H    Time spent on care of this patient: 40 mins   Allie Bossier , MD   [facility] Office  [phone] Pager - [phone]  On-Call/Text Page:      [email or web address]      password [email or web address]  If 7PM-7AM, please contact night-coverage [email or web address] Password [email or web address] 04/06/2014, 10:29 AM   LOS: 5 days

## 2014-04-07 ENCOUNTER — Encounter (HOSPITAL_COMMUNITY)
Admission: EM | Disposition: A | Discharge status: Skilled Nursing Facility | Payer: Self-pay | Source: Home / Self Care | Attending: Internal Medicine

## 2014-04-07 ENCOUNTER — Encounter (HOSPITAL_COMMUNITY): Payer: Self-pay | Admitting: *Deleted

## 2014-04-07 DIAGNOSIS — M519 Unspecified thoracic, thoracolumbar and lumbosacral intervertebral disc disorder: Secondary | ICD-10-CM

## 2014-04-07 DIAGNOSIS — Z6841 Body Mass Index (BMI) 40.0 and over, adult: Secondary | ICD-10-CM

## 2014-04-07 DIAGNOSIS — I10 Essential (primary) hypertension: Secondary | ICD-10-CM

## 2014-04-07 DIAGNOSIS — M869 Osteomyelitis, unspecified: Secondary | ICD-10-CM

## 2014-04-07 HISTORY — PX: TEE WITHOUT CARDIOVERSION: SHX5443

## 2014-04-07 LAB — CBC WITH DIFFERENTIAL/PLATELET
Basophils Absolute: 0 10*3/uL (ref 0.0–0.1)
Basophils Relative: 0 % (ref 0–1)
EOS PCT: 0 % (ref 0–5)
Eosinophils Absolute: 0.1 10*3/uL (ref 0.0–0.7)
HEMATOCRIT: 46.6 % (ref 39.0–52.0)
HEMOGLOBIN: 16.1 g/dL (ref 13.0–17.0)
LYMPHS ABS: 0.8 10*3/uL (ref 0.7–4.0)
LYMPHS PCT: 5 % — AB (ref 12–46)
MCH: 31.8 pg (ref 26.0–34.0)
MCHC: 34.5 g/dL (ref 30.0–36.0)
MCV: 91.9 fL (ref 78.0–100.0)
MONO ABS: 1.2 10*3/uL — AB (ref 0.1–1.0)
MONOS PCT: 8 % (ref 3–12)
NEUTROS ABS: 13.6 10*3/uL — AB (ref 1.7–7.7)
Neutrophils Relative %: 87 % — ABNORMAL HIGH (ref 43–77)
Platelets: 339 10*3/uL (ref 150–400)
RBC: 5.07 MIL/uL (ref 4.22–5.81)
RDW: 15.4 % (ref 11.5–15.5)
WBC: 15.7 10*3/uL — AB (ref 4.0–10.5)

## 2014-04-07 LAB — MAGNESIUM: Magnesium: 2.2 mg/dL (ref 1.5–2.5)

## 2014-04-07 LAB — COMPREHENSIVE METABOLIC PANEL
ALBUMIN: 2.3 g/dL — AB (ref 3.5–5.2)
ALT: 39 U/L (ref 0–53)
ANION GAP: 8 (ref 5–15)
AST: 40 U/L — ABNORMAL HIGH (ref 0–37)
Alkaline Phosphatase: 117 U/L (ref 39–117)
BUN: 19 mg/dL (ref 6–23)
CHLORIDE: 104 meq/L (ref 96–112)
CO2: 28 mEq/L (ref 19–32)
CREATININE: 0.65 mg/dL (ref 0.50–1.35)
Calcium: 8.3 mg/dL — ABNORMAL LOW (ref 8.4–10.5)
GFR calc Af Amer: >90 mL/min (ref >90)
GFR calc non Af Amer: >90 mL/min (ref >90)
Glucose, Bld: 111 mg/dL — ABNORMAL HIGH (ref 70–99)
Potassium: 4 mEq/L (ref 3.7–5.3)
Sodium: 140 mEq/L (ref 137–147)
TOTAL PROTEIN: 5.7 g/dL — AB (ref 6.0–8.3)
Total Bilirubin: 0.9 mg/dL (ref 0.3–1.2)

## 2014-04-07 LAB — APTT: aPTT: 36 seconds (ref 24–37)

## 2014-04-07 LAB — HEPARIN LEVEL (UNFRACTIONATED): HEPARIN UNFRACTIONATED: 0.46 [IU]/mL (ref 0.30–0.70)

## 2014-04-07 LAB — PROTIME-INR
INR: 1.26 (ref 0.00–1.49)
Prothrombin Time: 15.8 seconds — ABNORMAL HIGH (ref 11.6–15.2)

## 2014-04-07 SURGERY — ECHOCARDIOGRAM, TRANSESOPHAGEAL
Anesthesia: Moderate Sedation

## 2014-04-07 MED ORDER — HEPARIN BOLUS VIA INFUSION
2000.0000 [IU] | Freq: Once | INTRAVENOUS | Status: AC
  Administered 2014-04-07: 2000 [IU] via INTRAVENOUS
  Filled 2014-04-07: qty 2000

## 2014-04-07 MED ORDER — LIDOCAINE VISCOUS 2 % MT SOLN
OROMUCOSAL | Status: AC
  Filled 2014-04-07: qty 15

## 2014-04-07 MED ORDER — DEXTROSE 5 % IV SOLN
1.0000 g | INTRAVENOUS | Status: DC
  Administered 2014-04-07: 1 g via INTRAVENOUS
  Filled 2014-04-07 (×2): qty 10

## 2014-04-07 MED ORDER — BUTAMBEN-TETRACAINE-BENZOCAINE 2-2-14 % EX AERO
INHALATION_SPRAY | CUTANEOUS | Status: DC | PRN
  Administered 2014-04-07: 2 via TOPICAL

## 2014-04-07 MED ORDER — DILTIAZEM HCL ER COATED BEADS 360 MG PO CP24
360.0000 mg | ORAL_CAPSULE | Freq: Every day | ORAL | Status: DC
  Administered 2014-04-08 – 2014-04-15 (×8): 360 mg via ORAL
  Filled 2014-04-07 (×9): qty 1

## 2014-04-07 MED ORDER — SODIUM CHLORIDE 0.9 % IV SOLN: Freq: Once | INTRAVENOUS | Status: DC

## 2014-04-07 MED ORDER — MIDAZOLAM HCL 10 MG/2ML IJ SOLN
INTRAMUSCULAR | Status: DC | PRN
Start: 2014-04-07 — End: 2014-04-07
  Administered 2014-04-07 (×3): 1 mg via INTRAVENOUS

## 2014-04-07 MED ORDER — LOSARTAN POTASSIUM 50 MG PO TABS
50.0000 mg | ORAL_TABLET | Freq: Every day | ORAL | Status: DC
Start: 2014-04-07 — End: 2014-04-15
  Administered 2014-04-08 – 2014-04-15 (×8): 50 mg via ORAL
  Filled 2014-04-07 (×9): qty 1

## 2014-04-07 MED ORDER — FENTANYL CITRATE 0.05 MG/ML IJ SOLN
INTRAMUSCULAR | Status: DC | PRN
  Administered 2014-04-07: 25 ug via INTRAVENOUS
  Administered 2014-04-07: 12.5 ug via INTRAVENOUS

## 2014-04-07 MED ORDER — APIXABAN 5 MG PO TABS
5.0000 mg | ORAL_TABLET | Freq: Two times a day (BID) | ORAL | Status: DC
  Administered 2014-04-07 – 2014-04-15 (×16): 5 mg via ORAL
  Filled 2014-04-07 (×19): qty 1

## 2014-04-07 MED ORDER — MIDAZOLAM HCL 5 MG/ML IJ SOLN
INTRAMUSCULAR | Status: AC
  Filled 2014-04-07: qty 2

## 2014-04-07 MED ORDER — FENTANYL CITRATE 0.05 MG/ML IJ SOLN
INTRAMUSCULAR | Status: AC
Start: 2014-04-07 — End: 2014-04-07
  Filled 2014-04-07: qty 2

## 2014-04-07 NOTE — Progress Notes (Signed)
INFECTIOUS DISEASE PROGRESS NOTE  ID: [name] is a 73 y.o. male with  Active Problems:   Dehydration   Leukocytosis   Atrial fibrillation with rapid ventricular response   Abdominal pain, generalized   Nausea vomiting and diarrhea   Back pain   Transaminitis   Sepsis   Thrombocytopenia, unspecified  Subjective: Without complaints  Abtx:  Anti-infectives   Start     Dose/Rate Route Frequency Ordered Stop   04/07/14 0000  piperacillin-tazobactam (ZOSYN) IVPB 3.375 g     3.375 g 12.5 mL/hr over 240 Minutes Intravenous Every 8 hours 04/06/14 1735     04/06/14 1800  cefTRIAXone (ROCEPHIN) 2 g in dextrose 5 % 50 mL IVPB  Status:  Discontinued     2 g 100 mL/hr over 30 Minutes Intravenous Every 24 hours 04/06/14 1228 04/06/14 1724   04/06/14 1800  piperacillin-tazobactam (ZOSYN) IVPB 3.375 g     3.375 g 100 mL/hr over 30 Minutes Intravenous  Once 04/06/14 1735 04/06/14 1836   04/06/14 1800  vancomycin (VANCOCIN) IVPB 1000 mg/200 mL premix     1,000 mg 200 mL/hr over 60 Minutes Intravenous Every 8 hours 04/06/14 1735     04/04/14 1800  cefTRIAXone (ROCEPHIN) 1 g in dextrose 5 % 50 mL IVPB  Status:  Discontinued     1 g 100 mL/hr over 30 Minutes Intravenous Every 24 hours 04/04/14 1720 04/06/14 1228   04/02/14 0200  piperacillin-tazobactam (ZOSYN) IVPB 3.375 g  Status:  Discontinued     3.375 g 12.5 mL/hr over 240 Minutes Intravenous 3 times per day 04/02/14 0149 04/04/14 1720   04/01/14 2000  ciprofloxacin (CIPRO) IVPB 400 mg  Status:  Discontinued     400 mg 200 mL/hr over 60 Minutes Intravenous Every 12 hours 04/01/14 1954 04/02/14 0054   04/01/14 2000  metroNIDAZOLE (FLAGYL) IVPB 500 mg  Status:  Discontinued     500 mg 100 mL/hr over 60 Minutes Intravenous Every 8 hours 04/01/14 1954 04/02/14 0054      Medications:  Scheduled: . sodium chloride   Intravenous Once  . amiodarone  400 mg Oral BID  . antiseptic oral rinse  7 mL Mouth Rinse BID  . diltiazem  360  mg Oral Daily  . losartan  50 mg Oral Daily  . ondansetron  4 mg Intravenous Once  . piperacillin-tazobactam (ZOSYN)  IV  3.375 g Intravenous Q8H  . senna-docusate  1 tablet Oral BID  . sodium chloride  3 mL Intravenous Q12H  . vancomycin  1,000 mg Intravenous Q8H    Objective: Vital signs in last 24 hours: Temp:  [97.7 F (36.5 C)-98.3 F (36.8 C)] 98.3 F (36.8 C) (09/23 1149) Pulse Rate:  [75-86] 75 (09/23 1149) Resp:  [17-26] 18 (09/23 1149) BP: (132-188)/(53-77) 134/58 mmHg (09/23 1149) SpO2:  [94 %-99 %] 97 % (09/23 1149) Weight:  [102.4 kg (225 lb 12 oz)] 102.4 kg (225 lb 12 oz) (09/23 0500)   General appearance: alert, cooperative, fatigued and mild distress Resp: clear to auscultation bilaterally Cardio: regular rate and rhythm GI: abnormal findings:  distended and hypoactive bowel sounds  Lab Results  Recent Labs  04/06/14 0302 04/07/14 0258  WBC 17.4* 15.7*  HGB 16.2 16.1  HCT 46.0 46.6  NA 140 140  K 3.7 4.0  CL 105 104  CO2 25 28  BUN 20 19  CREATININE 0.64 0.65   Liver Panel  Recent Labs  04/06/14 0302 04/07/14 0258  PROT 5.6* 5.7*  ALBUMIN 2.2* 2.3*  AST 41* 40*  ALT 42 39  ALKPHOS 96 117  BILITOT 0.8 0.9   Sedimentation Rate No results found for this basename: ESRSEDRATE,  in the last 72 hours C-Reactive Protein No results found for this basename: CRP,  in the last 72 hours  Microbiology: Recent Results (from the past 240 hour(s))  URINE CULTURE     Status: None   Collection Time    04/01/14  4:47 PM      Result Value Ref Range Status   Specimen Description URINE, CATHETERIZED   Final   Special Requests ADDED [id number]   Final   Culture  Setup Time     Final   Value: 04/02/2014 05:58     Performed at [place]     Final   Value: 1,000 COLONIES/ML     Performed at [facility]   Culture     Final   Value: INSIGNIFICANT GROWTH     Performed at [facility]   Report Status 04/03/2014  FINAL   Final  STOOL CULTURE     Status: None   Collection Time    04/01/14  5:17 PM      Result Value Ref Range Status   Specimen Description STOOL   Final   Special Requests NONE   Final   Culture     Final   Value: NO SALMONELLA, SHIGELLA, CAMPYLOBACTER, YERSINIA, OR E.COLI 0157:H7 ISOLATED     Performed at [facility]   Report Status 04/05/2014 FINAL   Final  CLOSTRIDIUM DIFFICILE BY PCR     Status: None   Collection Time    04/01/14  5:17 PM      Result Value Ref Range Status   C difficile by pcr NEGATIVE  NEGATIVE Final  CULTURE, BLOOD (ROUTINE X 2)     Status: None   Collection Time    04/01/14  8:10 PM      Result Value Ref Range Status   Specimen Description BLOOD HAND RIGHT   Final   Special Requests BOTTLES DRAWN AEROBIC AND ANAEROBIC 10CC   Final   Culture  Setup Time     Final   Value: 04/02/2014 00:57     Performed at [facility]   Culture     Final   Value: GROUP B STREP(S.AGALACTIAE)ISOLATED     Note: Gram Stain Report Called to,Read Back By and Verified With: BERNADETTE RONCALLO 04/02/14 1400 BY SMITHERSJ     Performed at [facility]   Report Status 04/04/2014 FINAL   Final   Organism ID, Bacteria GROUP B STREP(S.AGALACTIAE)ISOLATED   Final  CULTURE, BLOOD (ROUTINE X 2)     Status: None   Collection Time    04/01/14  8:18 PM      Result Value Ref Range Status   Specimen Description BLOOD HAND RIGHT   Final   Special Requests BOTTLES DRAWN AEROBIC AND ANAEROBIC 5CC   Final   Culture  Setup Time     Final   Value: 04/02/2014 00:56     Performed at [facility]   Culture     Final   Value: GROUP B STREP(S.AGALACTIAE)ISOLATED     Note: SUSCEPTIBILITIES PERFORMED ON PREVIOUS CULTURE WITHIN THE LAST 5 DAYS.     Note: Gram Stain Report Called to,Read Back By and Verified With: BERNADETTE RONCALLO 04/02/14 1400 BY SMITHERSJ     Performed at [facility]  Report Status 04/04/2014 FINAL   Final  MRSA PCR SCREENING      Status: None   Collection Time    04/01/14  9:42 PM      Result Value Ref Range Status   MRSA by PCR NEGATIVE  NEGATIVE Final   Comment:            The GeneXpert MRSA Assay (FDA     approved for NASAL specimens     only), is one component of a     comprehensive MRSA colonization     surveillance program. It is not     intended to diagnose MRSA     infection nor to guide or     monitor treatment for     MRSA infections.  OVA AND PARASITE EXAMINATION     Status: None   Collection Time    04/01/14 11:50 PM      Result Value Ref Range Status   Specimen Description STOOL   Final   Special Requests NONE   Final   Ova and parasites     Final   Value: NO OVA OR PARASITES SEEN     Performed at [facility]   Report Status 04/06/2014 FINAL   Final  CULTURE, BLOOD (ROUTINE X 2)     Status: None   Collection Time    04/05/14  7:07 PM      Result Value Ref Range Status   Specimen Description BLOOD LEFT ARM   Final   Special Requests BOTTLES DRAWN AEROBIC AND ANAEROBIC 8CC   Final   Culture  Setup Time     Final   Value: 04/06/2014 01:37     Performed at [facility]   Culture     Final   Value:        BLOOD CULTURE RECEIVED NO GROWTH TO DATE CULTURE WILL BE HELD FOR 5 DAYS BEFORE ISSUING A FINAL NEGATIVE REPORT     Performed at [facility]   Report Status PENDING   Incomplete    Studies/Results: Mr Thoracic Spine W Wo Contrast  04/06/2014   CLINICAL DATA:  Bacteremia and back pain.  EXAM: MRI THORACIC AND LUMBAR SPINE WITHOUT AND WITH CONTRAST  TECHNIQUE: Multiplanar and multiecho pulse sequences of the thoracic and lumbar spine were obtained without and with intravenous contrast.  CONTRAST:  92mL MULTIHANCE GADOBENATE DIMEGLUMINE 529 MG/ML IV SOLN  COMPARISON:  CT abdomen and pelvis 04/01/2014. Lumbar spine radiographs 04/01/2014. Chest CT 08/23/2005.  FINDINGS: MR THORACIC SPINE FINDINGS  Vertebral alignment is within normal limits. No compression fracture is  identified. Scattered, small Schmorl's nodes are noted in the mid and lower thoracic spine. Mild degenerative endplate changes are present in the mid and lower thoracic spine. No vertebral marrow edema is identified, although evaluation is limited by low signal through the lower thoracic spine. No abnormal enhancement is identified. Mild subcutaneous soft tissue edema is noted, predominantly in the lower back.  Axial images are degraded by motion artifact. Spinal cord is normal in caliber and grossly normal in signal. Small right central disc protrusion at T4-5 and small central disc protrusion at T9-10 do not result in stenosis. More diffuse disc bulging at T10-11 minimally effaces the ventral thecal sac without significant spinal canal stenosis. There are small bilateral pleural effusions. Abnormal signal is present in the right greater than left lower lobes of the lungs.  MR LUMBAR SPINE FINDINGS  Examination is severely limited by motion artifact and low signal  to noise.  There is straightening of the normal lumbar lordosis. Mild lumbar levoscoliosis is present. There is trace retrolisthesis of L2 on L3, unchanged. Moderate to severe disc space narrowing is again seen from L2-3 to L5-S1. There is prominent T2/ STIR hyperintensity in the disc spaces at each of these 4 levels. There is also evidence of marrow edema and enhancement in the right aspects of the vertebral bodies adjacent to the L2-3 disc space and there is asymmetric fluid in the right L2-3 facet joint. There is also likely paravertebral soft tissue edema and enhancement to the right of the L2 and L3 vertebral bodies with some involvement of the psoas muscle.  There is mild marrow edema and enhancement in the vertebral bodies adjacent to the L4-5 disc space, greater left of midline. There is abnormal T2 hyperintense, heterogeneously enhancing material in the ventral epidural space behind the vertebral bodies extending from the L3-4 disc space level to  the S1 vertebral body level, and this may communicate with the L4-5 disc space. This contributes to moderate to severe spinal stenosis at and just cranial to the L4-5 disc space. Prior left hemilaminectomies are identified at L4-5 and L5-S1.  Asymmetric right facet arthrosis and small facet joint effusions are also noted at L1-2 and L3-4. 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 Conus medullaris terminates at T12-L1.  IMPRESSION: 1. Motion degraded study as above. Prominent fluid signal in the the L2-3 to L5-S1 disc spaces with marrow edema suspicious for discitis/osteomyelitis specifically at L2-3 and L4-5. Ventral epidural material extending from L4-S1 likely represents phlegmon, potentially arising from the L4-5 disc space and contributing to prominent spinal stenosis at the L4 level. 2. Facet joint fluid at L1-2, L2-3, and L3-4 may reflect advanced chronic facet arthritis although septic arthritis cannot be excluded, particularly at L2-3. 3. No evidence of infection in the thoracic spine. 4. Small bilateral pleural effusions and abnormal signal in the lower lobes. Evaluation is limited by motion, however pneumonia is a consideration in addition to atelectasis. Repeat chest radiographs are recommended. These results were called by telephone at the time of interpretation on 04/06/2014 at 5:09 pm to Dr. Sherral Hammers, who verbally acknowledged these results.   Electronically Signed   By: Logan Bores   On: 04/06/2014 17:11   Mr Lumbar Spine W Wo Contrast  04/06/2014   CLINICAL DATA:  Bacteremia and back pain.  EXAM: MRI THORACIC AND LUMBAR SPINE WITHOUT AND WITH CONTRAST  TECHNIQUE: Multiplanar and multiecho pulse sequences of the thoracic and lumbar spine were obtained without and with intravenous contrast.  CONTRAST:  55mL MULTIHANCE GADOBENATE DIMEGLUMINE 529 MG/ML IV SOLN  COMPARISON:  CT abdomen and pelvis 04/01/2014. Lumbar spine  radiographs 04/01/2014. Chest CT 08/23/2005.  FINDINGS: MR THORACIC SPINE FINDINGS  Vertebral alignment is within normal limits. No compression fracture is identified. Scattered, small Schmorl's nodes are noted in the mid and lower thoracic spine. Mild degenerative endplate changes are present in the mid and lower thoracic spine. No vertebral marrow edema is identified, although evaluation is limited by low signal through the lower thoracic spine. No abnormal enhancement is identified. Mild subcutaneous soft tissue edema is noted, predominantly in the lower back.  Axial images are degraded by motion artifact. Spinal cord is normal in caliber and grossly normal in signal. Small right central disc protrusion at T4-5 and small central disc protrusion at T9-10 do not result in stenosis. More diffuse  disc bulging at T10-11 minimally effaces the ventral thecal sac without significant spinal canal stenosis. There are small bilateral pleural effusions. Abnormal signal is present in the right greater than left lower lobes of the lungs.  MR LUMBAR SPINE FINDINGS  Examination is severely limited by motion artifact and low signal to noise.  There is straightening of the normal lumbar lordosis. Mild lumbar levoscoliosis is present. There is trace retrolisthesis of L2 on L3, unchanged. Moderate to severe disc space narrowing is again seen from L2-3 to L5-S1. There is prominent T2/ STIR hyperintensity in the disc spaces at each of these 4 levels. There is also evidence of marrow edema and enhancement in the right aspects of the vertebral bodies adjacent to the L2-3 disc space and there is asymmetric fluid in the right L2-3 facet joint. There is also likely paravertebral soft tissue edema and enhancement to the right of the L2 and L3 vertebral bodies with some involvement of the psoas muscle.  There is mild marrow edema and enhancement in the vertebral bodies adjacent to the L4-5 disc space, greater left of midline. There is  abnormal T2 hyperintense, heterogeneously enhancing material in the ventral epidural space behind the vertebral bodies extending from the L3-4 disc space level to the S1 vertebral body level, and this may communicate with the L4-5 disc space. This contributes to moderate to severe spinal stenosis at and just cranial to the L4-5 disc space. Prior left hemilaminectomies are identified at L4-5 and L5-S1.  Asymmetric right facet arthrosis and small facet joint effusions are also noted at L1-2 and L3-4. 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 Conus medullaris terminates at T12-L1.  IMPRESSION: 1. Motion degraded study as above. Prominent fluid signal in the the L2-3 to L5-S1 disc spaces with marrow edema suspicious for discitis/osteomyelitis specifically at L2-3 and L4-5. Ventral epidural material extending from L4-S1 likely represents phlegmon, potentially arising from the L4-5 disc space and contributing to prominent spinal stenosis at the L4 level. 2. Facet joint fluid at L1-2, L2-3, and L3-4 may reflect advanced chronic facet arthritis although septic arthritis cannot be excluded, particularly at L2-3. 3. No evidence of infection in the thoracic spine. 4. Small bilateral pleural effusions and abnormal signal in the lower lobes. Evaluation is limited by motion, however pneumonia is a consideration in addition to atelectasis. Repeat chest radiographs are recommended. These results were called by telephone at the time of interpretation on 04/06/2014 at 5:09 pm to Dr. Sherral Hammers, who verbally acknowledged these results.   Electronically Signed   By: Logan Bores   On: 04/06/2014 17:11     Assessment/Plan: Group B Strep bacteremia (TEE -) L2-L5 discitis, osteomyelitis  Afib with RVR ? ileus Morbid obesity (bmi 40.9)   Total days of antibiotics: 6  9-20 Ceftriaxone  9-18 Zosyn 9-20  Would plan for 6 weeks of ceftriaxone Repeat BCx  9-21 ngtd Appreciate CV  Bowel hygiene, consider abd films Glad to see in clinic for f/u          Bobby Rumpf Infectious Diseases (pager) [phone] [email or web address] 04/07/2014, 1:49 PM  LOS: 6 days

## 2014-04-07 NOTE — Progress Notes (Signed)
Patient Name: [name] Date of Encounter: 04/07/2014     Active Problems:   Dehydration   Leukocytosis   Atrial fibrillation with rapid ventricular response   Abdominal pain, generalized   Nausea vomiting and diarrhea   Back pain   Transaminitis   Sepsis   Thrombocytopenia, unspecified    SUBJECTIVE  Denies any CP or SOB. Continue to have back pain.  CURRENT MEDS . sodium chloride   Intravenous Once  . amiodarone  400 mg Oral BID  . antiseptic oral rinse  7 mL Mouth Rinse BID  . diltiazem  90 mg Oral 4 times per day  . ondansetron  4 mg Intravenous Once  . piperacillin-tazobactam (ZOSYN)  IV  3.375 g Intravenous Q8H  . senna-docusate  1 tablet Oral BID  . sodium chloride  3 mL Intravenous Q12H  . vancomycin  1,000 mg Intravenous Q8H    OBJECTIVE  Filed Vitals:   04/06/14 2010 04/06/14 2257 04/07/14 0400 04/07/14 0500  BP: 161/68 156/71 157/68   Pulse:  81 78   Temp: 98.2 F (36.8 C) 97.7 F (36.5 C) 98.3 F (36.8 C)   TempSrc: Oral Oral Oral   Resp:  21 19   Height:      Weight:    225 lb 12 oz (102.4 kg)  SpO2: 97%  97%     Intake/Output Summary (Last 24 hours) at 04/07/14 0802 Last data filed at 04/07/14 0500  Gross per 24 hour  Intake 1986.7 ml  Output   1225 ml  Net  761.7 ml   Filed Weights   04/04/14 0406 04/06/14 0413 04/07/14 0500  Weight: 210 lb 5.1 oz (95.4 kg) 209 lb 7 oz (95 kg) 225 lb 12 oz (102.4 kg)    PHYSICAL EXAM  General: Pleasant, NAD. Sleepy Neuro: Alert and oriented X 3. Moves all extremities spontaneously. Psych: Normal affect. HEENT:  Normal  Neck: Supple without bruits or JVD. Lungs:  Resp regular and unlabored, CTA. Heart: RRR no s3, s4, or murmurs. Abdomen: Soft, non-tender. Mildly distended.  Extremities: No clubbing, cyanosis or edema. DP/PT/Radials 2+ and equal bilaterally.  Accessory Clinical Findings  CBC  Recent Labs  04/06/14 0302 04/07/14 0258  WBC 17.4* 15.7*  NEUTROABS  --  13.6*  HGB  16.2 16.1  HCT 46.0 46.6  MCV 93.5 91.9  PLT 260 062   Basic Metabolic Panel  Recent Labs  04/06/14 0302 04/07/14 0258  NA 140 140  K 3.7 4.0  CL 105 104  CO2 25 28  GLUCOSE 121* 111*  BUN 20 19  CREATININE 0.64 0.65  CALCIUM 7.8* 8.3*  MG  --  2.2   Liver Function Tests  Recent Labs  04/06/14 0302 04/07/14 0258  AST 41* 40*  ALT 42 39  ALKPHOS 96 117  BILITOT 0.8 0.9  PROT 5.6* 5.7*  ALBUMIN 2.2* 2.3*   Cardiac Enzymes No results found for this basename: CKTOTAL, CKMB, CKMBINDEX, TROPONINI,  in the last 72 hours  TELE NSR with HR 70-80s, no significant ventricular ectopy. No recurrent Afib.    ECG  No new EKG  Echocardiogram  04/03/2014 Study Conclusions  - Left ventricle: The cavity size was normal. The study is not technically sufficient to allow evaluation of LV diastolic function. LVEF is probably normal. Wall motion cannot be analyzed. - Left atrium: The atrium was mildly to moderately dilated. - Very limited study due to obesity and extreme tachycardia. The study should be repeated with microbubble  contrast when the heart rate is better controlled.      Radiology/Studies  Dg Chest 2 View  04/01/2014   CLINICAL DATA:  Shortness of breath.  Cough.  EXAM: CHEST  2 VIEW  COMPARISON:  CT scan dated 08/23/2005  FINDINGS: Heart size and pulmonary vascularity are normal. Lungs are clear. No acute osseous abnormality. Prominent right first costochondral junction.  IMPRESSION: No active cardiopulmonary disease.   Electronically Signed   By: Rozetta Nunnery M.D.   On: 04/01/2014 17:25   Dg Lumbar Spine Complete  04/01/2014   CLINICAL DATA:  Low back pain.  EXAM: LUMBAR SPINE - COMPLETE 4+ VIEW  COMPARISON:  CT abdomen and pelvis 01/25/2013  FINDINGS: Mild lumbar levoscoliosis his again seen with severe asymmetric right-sided disc space narrowing at L2-3 and L3-4 with associated vertebral body sclerosis and bridging osteophytes. 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 Trace retrolisthesis of L2 on L3 is unchanged and likely degenerative. There is no evidence of compression fracture. Atherosclerotic aortic calcification is noted.  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 No acute osseous abnormality identified.   Electronically Signed   By: Logan Bores   On: 04/01/2014 17:27   Ct Abdomen Pelvis W Contrast  04/01/2014   CLINICAL DATA:  Lower back pain and generalized weakness. Vomiting and anorexia.  EXAM: CT ABDOMEN AND PELVIS WITH CONTRAST  TECHNIQUE: Multidetector CT imaging of the abdomen and pelvis was performed using the standard protocol following bolus administration of intravenous contrast.  CONTRAST:  135mL OMNIPAQUE IOHEXOL 300 MG/ML  SOLN  COMPARISON:  CT of the abdomen and pelvis from 01/25/2013  FINDINGS: Minimal bibasilar atelectasis is noted. Scattered coronary artery calcifications are seen.  A stable 2.6 cm hypodensity within the left hepatic lobe likely reflects a cyst. The liver and spleen are otherwise unremarkable. The gallbladder is borderline normal in size, and grossly unremarkable in appearance. The pancreas and adrenal glands are unremarkable.  Diffuse perinephric stranding is noted. As this is new from the prior study, it raises concern for mild diffuse bilateral pyelonephritis.  A 9 mm cyst is noted at the upper pole of the right kidney. Scattered vascular calcifications are noted at the right renal hilum. The kidneys are otherwise unremarkable. There is no evidence of hydronephrosis. No renal or ureteral stones are identified.  Trace free fluid is seen tracking along the mid to distal left ureter, at the left lower quadrant.  The small bowel is unremarkable in appearance. The stomach is within normal limits. No acute vascular abnormalities are seen.  Indication is status post appendectomy. Contrast progresses to the level of the sigmoid colon. The colon is unremarkable in appearance.   The bladder is mildly distended and grossly unremarkable. The prostate is mildly enlarged, measuring 4.9 cm in transverse dimension. No inguinal lymphadenopathy is seen.  No acute osseous abnormalities are identified. Multilevel vacuum phenomenon, endplate sclerotic change and disc space narrowing are noted along the lumbar spine.  IMPRESSION: 1. Diffuse perinephric stranding, new from the prior study. This raises concern for mild diffuse bilateral pyelonephritis. Would correlate for associated symptoms. 2. Small right renal cyst and likely hepatic cyst. 3. Scattered coronary artery calcifications seen. 4. Mildly enlarged prostate. 5. Mild degenerative change noted along the lumbar spine.   Electronically Signed   By: Garald Balding M.D.   On: 04/01/2014 21:50   CLINICAL DATA: Bacteremia and back pain.  EXAM:  MRI THORACIC AND LUMBAR SPINE WITHOUT AND WITH CONTRAST  TECHNIQUE:  Multiplanar and multiecho pulse sequences of the thoracic and lumbar  spine were obtained without and with intravenous contrast.  CONTRAST: 72mL MULTIHANCE GADOBENATE DIMEGLUMINE 529 MG/ML IV SOLN  COMPARISON: CT abdomen and pelvis 04/01/2014. Lumbar spine  radiographs 04/01/2014. Chest CT 08/23/2005.  FINDINGS:  MR THORACIC SPINE FINDINGS  Vertebral alignment is within normal limits. No compression fracture  is identified. Scattered, small Schmorl's nodes are noted in the mid  and lower thoracic spine. Mild degenerative endplate changes are  present in the mid and lower thoracic spine. No vertebral marrow  edema is identified, although evaluation is limited by low signal  through the lower thoracic spine. No abnormal enhancement is  identified. Mild subcutaneous soft tissue edema is noted,  predominantly in the lower back.  Axial images are degraded by motion artifact. Spinal cord is normal  in caliber and grossly normal in signal. Small right central disc  protrusion at T4-5 and small central disc protrusion at T9-10  do not  result in stenosis. More diffuse disc bulging at T10-11 minimally  effaces the ventral thecal sac without significant spinal canal  stenosis. There are small bilateral pleural effusions. Abnormal  signal is present in the right greater than left lower lobes of the  lungs.  MR LUMBAR SPINE FINDINGS  Examination is severely limited by motion artifact and low signal to  noise.  There is straightening of the normal lumbar lordosis. Mild lumbar  levoscoliosis is present. There is trace retrolisthesis of L2 on L3,  unchanged. Moderate to severe disc space narrowing is again seen  from L2-3 to L5-S1. There is prominent T2/ STIR hyperintensity in  the disc spaces at each of these 4 levels. There is also evidence of  marrow edema and enhancement in the right aspects of the vertebral  bodies adjacent to the L2-3 disc space and there is asymmetric fluid  in the right L2-3 facet joint. There is also likely paravertebral  soft tissue edema and enhancement to the right of the L2 and L3  vertebral bodies with some involvement of the psoas muscle.  There is mild marrow edema and enhancement in the vertebral bodies  adjacent to the L4-5 disc space, greater left of midline. There is  abnormal T2 hyperintense, heterogeneously enhancing material in the  ventral epidural space behind the vertebral bodies extending from  the L3-4 disc space level to the S1 vertebral body level, and this  may communicate with the L4-5 disc space. This contributes to  moderate to severe spinal stenosis at and just cranial to the L4-5  disc space. Prior left hemilaminectomies are identified at L4-5 and  L5-S1.  Asymmetric right facet arthrosis and small facet joint effusions are  also noted at L1-2 and L3-4. Right L5 pars defect is noted. 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 Conus medullaris terminates at T12-L1.  IMPRESSION:  1. Motion degraded study as above.  Prominent fluid signal in the the  L2-3 to L5-S1 disc spaces with marrow edema suspicious for  discitis/osteomyelitis specifically at L2-3 and L4-5. Ventral  epidural material extending from L4-S1 likely represents phlegmon,  potentially arising from the L4-5 disc space and contributing to  prominent spinal stenosis at the L4 level.  2. Facet joint fluid at L1-2, L2-3, and L3-4 may reflect advanced  chronic facet arthritis although septic arthritis cannot be  excluded, particularly at L2-3.  3. No evidence of infection in the thoracic spine.  4. Small bilateral  pleural effusions and abnormal signal in the  lower lobes. Evaluation is limited by motion, however pneumonia is a  consideration in addition to atelectasis. Repeat chest radiographs  are recommended.  These results were called by telephone at the time of interpretation  on 04/06/2014 at 5:09 pm to Dr. Sherral Hammers, who verbally acknowledged  these results.  Electronically Signed  By: Logan Bores  On: 04/06/2014 17:11  ASSESSMENT AND PLAN  1. A-fib with RVR  - converted to NSR on amio, will switch to 400mg  BID for 1 week, then 200mg  BID afterward  - will switch to long acting Cardizem  - Eliquis on hold for possible surgical procedure. Now on IV Heparin.  - Echo 04/03/2014 EF normal, LA mild to moderately dilated, recommended repeat with microbubble contrast. Echo repeated on 9/21 poor image  2. Bacteremia- Strep: ID on board,  TEE today to assess heart valve, and guide duration of abx. Afebrile  3. N/V and diarrhea with dehydration: hydrated  4. 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   5. Leukocytosis: secondary to #2  - improving on abx  6. HTN: still elevated despite Cardizem. Will add losartan 50 mg daily.  Signed,  Ennio Houp [place], [place] 04/07/2014 8:02 AM

## 2014-04-07 NOTE — Progress Notes (Signed)
  Echocardiogram Echocardiogram Transesophageal has been performed.  [name] FRANCES 04/07/2014, 10:42 AM

## 2014-04-07 NOTE — H&P (View-Only) (Signed)
Patient Name: [name] Date of Encounter: 04/07/2014     Active Problems:   Dehydration   Leukocytosis   Atrial fibrillation with rapid ventricular response   Abdominal pain, generalized   Nausea vomiting and diarrhea   Back pain   Transaminitis   Sepsis   Thrombocytopenia, unspecified    SUBJECTIVE  Denies any CP or SOB. Continue to have back pain.  CURRENT MEDS . sodium chloride   Intravenous Once  . amiodarone  400 mg Oral BID  . antiseptic oral rinse  7 mL Mouth Rinse BID  . diltiazem  90 mg Oral 4 times per day  . ondansetron  4 mg Intravenous Once  . piperacillin-tazobactam (ZOSYN)  IV  3.375 g Intravenous Q8H  . senna-docusate  1 tablet Oral BID  . sodium chloride  3 mL Intravenous Q12H  . vancomycin  1,000 mg Intravenous Q8H    OBJECTIVE  Filed Vitals:   04/06/14 2010 04/06/14 2257 04/07/14 0400 04/07/14 0500  BP: 161/68 156/71 157/68   Pulse:  81 78   Temp: 98.2 F (36.8 C) 97.7 F (36.5 C) 98.3 F (36.8 C)   TempSrc: Oral Oral Oral   Resp:  21 19   Height:      Weight:    225 lb 12 oz (102.4 kg)  SpO2: 97%  97%     Intake/Output Summary (Last 24 hours) at 04/07/14 0802 Last data filed at 04/07/14 0500  Gross per 24 hour  Intake 1986.7 ml  Output   1225 ml  Net  761.7 ml   Filed Weights   04/04/14 0406 04/06/14 0413 04/07/14 0500  Weight: 210 lb 5.1 oz (95.4 kg) 209 lb 7 oz (95 kg) 225 lb 12 oz (102.4 kg)    PHYSICAL EXAM  General: Pleasant, NAD. Sleepy Neuro: Alert and oriented X 3. Moves all extremities spontaneously. Psych: Normal affect. HEENT:  Normal  Neck: Supple without bruits or JVD. Lungs:  Resp regular and unlabored, CTA. Heart: RRR no s3, s4, or murmurs. Abdomen: Soft, non-tender. Mildly distended.  Extremities: No clubbing, cyanosis or edema. DP/PT/Radials 2+ and equal bilaterally.  Accessory Clinical Findings  CBC  Recent Labs  04/06/14 0302 04/07/14 0258  WBC 17.4* 15.7*  NEUTROABS  --  13.6*  HGB  16.2 16.1  HCT 46.0 46.6  MCV 93.5 91.9  PLT 260 811   Basic Metabolic Panel  Recent Labs  04/06/14 0302 04/07/14 0258  NA 140 140  K 3.7 4.0  CL 105 104  CO2 25 28  GLUCOSE 121* 111*  BUN 20 19  CREATININE 0.64 0.65  CALCIUM 7.8* 8.3*  MG  --  2.2   Liver Function Tests  Recent Labs  04/06/14 0302 04/07/14 0258  AST 41* 40*  ALT 42 39  ALKPHOS 96 117  BILITOT 0.8 0.9  PROT 5.6* 5.7*  ALBUMIN 2.2* 2.3*   Cardiac Enzymes No results found for this basename: CKTOTAL, CKMB, CKMBINDEX, TROPONINI,  in the last 72 hours  TELE NSR with HR 70-80s, no significant ventricular ectopy. No recurrent Afib.    ECG  No new EKG  Echocardiogram  04/03/2014 Study Conclusions  - Left ventricle: The cavity size was normal. The study is not technically sufficient to allow evaluation of LV diastolic function. LVEF is probably normal. Wall motion cannot be analyzed. - Left atrium: The atrium was mildly to moderately dilated. - Very limited study due to obesity and extreme tachycardia. The study should be repeated with microbubble  contrast when the heart rate is better controlled.      Radiology/Studies  Dg Chest 2 View  04/01/2014   CLINICAL DATA:  Shortness of breath.  Cough.  EXAM: CHEST  2 VIEW  COMPARISON:  CT scan dated 08/23/2005  FINDINGS: Heart size and pulmonary vascularity are normal. Lungs are clear. No acute osseous abnormality. Prominent right first costochondral junction.  IMPRESSION: No active cardiopulmonary disease.   Electronically Signed   By: Rozetta Nunnery M.D.   On: 04/01/2014 17:25   Dg Lumbar Spine Complete  04/01/2014   CLINICAL DATA:  Low back pain.  EXAM: LUMBAR SPINE - COMPLETE 4+ VIEW  COMPARISON:  CT abdomen and pelvis 01/25/2013  FINDINGS: Mild lumbar levoscoliosis his again seen with severe asymmetric right-sided disc space narrowing at L2-3 and L3-4 with associated vertebral body sclerosis and bridging osteophytes. 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 Trace retrolisthesis of L2 on L3 is unchanged and likely degenerative. There is no evidence of compression fracture. Atherosclerotic aortic calcification is noted.  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 No acute osseous abnormality identified.   Electronically Signed   By: Logan Bores   On: 04/01/2014 17:27   Ct Abdomen Pelvis W Contrast  04/01/2014   CLINICAL DATA:  Lower back pain and generalized weakness. Vomiting and anorexia.  EXAM: CT ABDOMEN AND PELVIS WITH CONTRAST  TECHNIQUE: Multidetector CT imaging of the abdomen and pelvis was performed using the standard protocol following bolus administration of intravenous contrast.  CONTRAST:  181mL OMNIPAQUE IOHEXOL 300 MG/ML  SOLN  COMPARISON:  CT of the abdomen and pelvis from 01/25/2013  FINDINGS: Minimal bibasilar atelectasis is noted. Scattered coronary artery calcifications are seen.  A stable 2.6 cm hypodensity within the left hepatic lobe likely reflects a cyst. The liver and spleen are otherwise unremarkable. The gallbladder is borderline normal in size, and grossly unremarkable in appearance. The pancreas and adrenal glands are unremarkable.  Diffuse perinephric stranding is noted. As this is new from the prior study, it raises concern for mild diffuse bilateral pyelonephritis.  A 9 mm cyst is noted at the upper pole of the right kidney. Scattered vascular calcifications are noted at the right renal hilum. The kidneys are otherwise unremarkable. There is no evidence of hydronephrosis. No renal or ureteral stones are identified.  Trace free fluid is seen tracking along the mid to distal left ureter, at the left lower quadrant.  The small bowel is unremarkable in appearance. The stomach is within normal limits. No acute vascular abnormalities are seen.  Indication is status post appendectomy. Contrast progresses to the level of the sigmoid colon. The colon is unremarkable in appearance.   The bladder is mildly distended and grossly unremarkable. The prostate is mildly enlarged, measuring 4.9 cm in transverse dimension. No inguinal lymphadenopathy is seen.  No acute osseous abnormalities are identified. Multilevel vacuum phenomenon, endplate sclerotic change and disc space narrowing are noted along the lumbar spine.  IMPRESSION: 1. Diffuse perinephric stranding, new from the prior study. This raises concern for mild diffuse bilateral pyelonephritis. Would correlate for associated symptoms. 2. Small right renal cyst and likely hepatic cyst. 3. Scattered coronary artery calcifications seen. 4. Mildly enlarged prostate. 5. Mild degenerative change noted along the lumbar spine.   Electronically Signed   By: Garald Balding M.D.   On: 04/01/2014 21:50   CLINICAL DATA: Bacteremia and back pain.  EXAM:  MRI THORACIC AND LUMBAR SPINE WITHOUT AND WITH CONTRAST  TECHNIQUE:  Multiplanar and multiecho pulse sequences of the thoracic and lumbar  spine were obtained without and with intravenous contrast.  CONTRAST: 59mL MULTIHANCE GADOBENATE DIMEGLUMINE 529 MG/ML IV SOLN  COMPARISON: CT abdomen and pelvis 04/01/2014. Lumbar spine  radiographs 04/01/2014. Chest CT 08/23/2005.  FINDINGS:  MR THORACIC SPINE FINDINGS  Vertebral alignment is within normal limits. No compression fracture  is identified. Scattered, small Schmorl's nodes are noted in the mid  and lower thoracic spine. Mild degenerative endplate changes are  present in the mid and lower thoracic spine. No vertebral marrow  edema is identified, although evaluation is limited by low signal  through the lower thoracic spine. No abnormal enhancement is  identified. Mild subcutaneous soft tissue edema is noted,  predominantly in the lower back.  Axial images are degraded by motion artifact. Spinal cord is normal  in caliber and grossly normal in signal. Small right central disc  protrusion at T4-5 and small central disc protrusion at T9-10  do not  result in stenosis. More diffuse disc bulging at T10-11 minimally  effaces the ventral thecal sac without significant spinal canal  stenosis. There are small bilateral pleural effusions. Abnormal  signal is present in the right greater than left lower lobes of the  lungs.  MR LUMBAR SPINE FINDINGS  Examination is severely limited by motion artifact and low signal to  noise.  There is straightening of the normal lumbar lordosis. Mild lumbar  levoscoliosis is present. There is trace retrolisthesis of L2 on L3,  unchanged. Moderate to severe disc space narrowing is again seen  from L2-3 to L5-S1. There is prominent T2/ STIR hyperintensity in  the disc spaces at each of these 4 levels. There is also evidence of  marrow edema and enhancement in the right aspects of the vertebral  bodies adjacent to the L2-3 disc space and there is asymmetric fluid  in the right L2-3 facet joint. There is also likely paravertebral  soft tissue edema and enhancement to the right of the L2 and L3  vertebral bodies with some involvement of the psoas muscle.  There is mild marrow edema and enhancement in the vertebral bodies  adjacent to the L4-5 disc space, greater left of midline. There is  abnormal T2 hyperintense, heterogeneously enhancing material in the  ventral epidural space behind the vertebral bodies extending from  the L3-4 disc space level to the S1 vertebral body level, and this  may communicate with the L4-5 disc space. This contributes to  moderate to severe spinal stenosis at and just cranial to the L4-5  disc space. Prior left hemilaminectomies are identified at L4-5 and  L5-S1.  Asymmetric right facet arthrosis and small facet joint effusions are  also noted at L1-2 and L3-4. Right L5 pars defect is noted. 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 Conus medullaris terminates at T12-L1.  IMPRESSION:  1. Motion degraded study as above.  Prominent fluid signal in the the  L2-3 to L5-S1 disc spaces with marrow edema suspicious for  discitis/osteomyelitis specifically at L2-3 and L4-5. Ventral  epidural material extending from L4-S1 likely represents phlegmon,  potentially arising from the L4-5 disc space and contributing to  prominent spinal stenosis at the L4 level.  2. Facet joint fluid at L1-2, L2-3, and L3-4 may reflect advanced  chronic facet arthritis although septic arthritis cannot be  excluded, particularly at L2-3.  3. No evidence of infection in the thoracic spine.  4. Small bilateral  pleural effusions and abnormal signal in the  lower lobes. Evaluation is limited by motion, however pneumonia is a  consideration in addition to atelectasis. Repeat chest radiographs  are recommended.  These results were called by telephone at the time of interpretation  on 04/06/2014 at 5:09 pm to Dr. Sherral Hammers, who verbally acknowledged  these results.  Electronically Signed  By: Logan Bores  On: 04/06/2014 17:11  ASSESSMENT AND PLAN  1. A-fib with RVR  - converted to NSR on amio, will switch to 400mg  BID for 1 week, then 200mg  BID afterward  - will switch to long acting Cardizem  - Eliquis on hold for possible surgical procedure. Now on IV Heparin.  - Echo 04/03/2014 EF normal, LA mild to moderately dilated, recommended repeat with microbubble contrast. Echo repeated on 9/21 poor image  2. Bacteremia- Strep: ID on board,  TEE today to assess heart valve, and guide duration of abx. Afebrile  3. N/V and diarrhea with dehydration: hydrated  4. 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   5. Leukocytosis: secondary to #2  - improving on abx  6. HTN: still elevated despite Cardizem. Will add losartan 50 mg daily.  Signed,  Peter [place], [place] 04/07/2014 8:02 AM

## 2014-04-07 NOTE — CV Procedure (Signed)
TEE  Indications: Question endocarditis.  Findings: No vegetations detected. Normal ejection fraction. Reassuring exam.  Light sedation was utilized secondary to his underlying oxygen use of 4 L as well as body habitus suggestive of sleep apnea. He tolerated the procedure well with only minor transient decrease in oxygen saturation to approximately 80% which promptly resolved with stimulation. Close postoperative monitoring.  Candee Furbish, MD

## 2014-04-07 NOTE — Progress Notes (Addendum)
ANTICOAGULATION CONSULT NOTE - Follow Up Consult  Pharmacy Consult for Heparin Indication: bridge for Afib  No Known Allergies  Patient Measurements: Height: 5\' 10"  (177.8 cm) Weight: 225 lb 12 oz (102.4 kg) IBW/kg (Calculated) : 73  Vital Signs: Temp: 98.3 F (36.8 C) (09/23 1149) Temp src: Oral (09/23 1149) BP: 134/58 mmHg (09/23 1149) Pulse Rate: 75 (09/23 1149)  Labs:  Recent Labs  04/05/14 0417 04/06/14 0302 04/06/14 2015 04/07/14 0258 04/07/14 1240  HGB 16.9 16.2  --  16.1  --   HCT 47.4 46.0  --  46.6  --   PLT 199 260  --  339  --   APTT  --   --  29  --  36  LABPROT  --   --   --   --  15.8*  INR  --   --   --   --  1.26  HEPARINUNFRC  --   --  1.09*  --  0.46  CREATININE 0.64 0.64  --  0.65  --     Estimated Creatinine Clearance: 103.1 ml/min (by C-G formula based on Cr of 0.65).   Assessment: 73 year old male previously on Eliquis for Afib Transitioned to heparin last PM Heparin level therapeutic at 0.46 (falsely elevated due to apixaban), but PTT is still low at 36  Goal of Therapy:  PTT = 66 to 102 seconds Monitor platelets by anticoagulation protocol: Yes   Plan:  1) Heparin 2000 units iv bolus x 1 2) Heparin drip to 1500 units / hr 3) 6 hr heparin level, PTT  Thank you. Anette Guarneri, PharmD [phone]  Tad Moore 04/07/2014,2:06 PM  Addendum:  New orders received to transition back to apixaban this afternoon. Apixaban 5mg  bid appropriate given wt>80 and normal scr. Will check cbc in am and further checks to be ordered by MD.  Erin Hearing PharmD., BCPS Clinical Pharmacist Pager [phone] 04/07/2014 3:58 PM

## 2014-04-07 NOTE — Progress Notes (Signed)
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 Fib with RVR. Pt had progressive nausea, vomiting and diarrhea over 2-3 days. Also developed new onset back pain. Was at home and slid down the bed. Was unable to get up from the floor for several hours because of weakness.   In the ER patient noted to be in A. Fib with heart rate into the 140s. Blood pressure in the 100s to 130s. Satting greater than 92% on room air. Notable labs include a white blood cell count 16.6, hemoglobin 19.2, platelets 96, sodium 131, creatinine 1.0, BUN 31, AST 254, ALT 89. Lactate 2.45. CK 12k. Trop 0.09, then <0.3. UA mildly indicative of infection. L. Spine x-ray noted general lumbar degeneration. Chest x-ray within normal limits.  HPI/Subjective: Pt is groggy post TEE, but when awakens states that he feels better in general. No new complaints.  Denies cp, sob, f/c, n/v, or abdom pain.  Back pain is w/o signif change.    Assessment/Plan:   Sepsis due to Goup B Strep - 2/2 blood cx - source unclear w/ Lumbar diskitis/phlegmon Urine cx not convincing - UA equivocal - sepsis physiology improving - cont Rocephin - ID following w/ [place] - MRI of spine noted "prominent fluid signal in the the L2-3 to L5-S1 disc spaces with marrow edema suspicious for discitis/osteomyelitis specifically at L2-3 and L4-5" with no evidence of infection in the thoracic spine - TEE 9/23 notes no vegetations - plan is to complete 6 weeks of IV Rocephin (will need PICC when cleared for same per ID) - f/u imaging should be accomplished at that time - in absence of abscess, surgical tx would not likely be helpful at this time  (spoke w/ NS on call who agreed that IV abx for full course of 6-8 weeks w/ f/u imaging would be c/w his recommendations)   Lactic acidosis  Resolved w/ volume expansion  Newly diagnosed Afib w/ RVR  Per Cardiology - meds converted to oral again, and now tolerating well, with preserved NSR - heparin > eliquis  Rhabdomyolysis  Ck peak 12k - resolved   Guaiac positive  In setting of diarrhea, significance of this is not clear - Hgb stable / not anemic - will need outpt screening colonoscopy when more stable   Transaminitis likely shock liver - improving w/ hydration to point of essential resolution   Abdom pain - N/V - diarrhea  resolved  Thrombocytopenia Resolved   Morbid obesity - Body mass index is 32.39 kg/(m^2).  Code Status: FULL Family Communication: no family present at time of exam Disposition Plan: possible transfer to floor in AM if afib remains stable   Consultants: Cardiology Neurology   Procedures: none  Antibiotics: Zosyn 9/17 > 9/20 Rocephin 9/20 >  DVT prophylaxis: eliquis  Objective: Blood pressure 134/58, pulse 75, temperature 98.3 F (36.8 C), temperature source Oral, resp. rate 18, height 5\' 10"  (1.778 m), weight 102.4 kg (225 lb 12 oz), SpO2 97.00%.  Intake/Output Summary (Last 24 hours) at 04/07/14 1511 Last data filed at 04/07/14 0935  Gross per 24 hour  Intake   1280 ml  Output    925 ml  Net    355 ml  Exam: General: No acute respiratory distress Lungs: Clear to auscultation bilaterally without wheezes or crackles Cardiovascular: RRR w/o gallup or rub - no appreciable M  Abdomen: Nontender, protuberent, soft, bowel sounds positive, no rebound, no ascites, no appreciable mass Extremities: No significant cyanosis, clubbing, or edema bilateral lower extremities  Data Reviewed: Basic Metabolic Panel:  Recent Labs Lab 04/02/14 0253 04/03/14 0045 04/04/14 0309 04/05/14 0417 04/06/14 0302 04/07/14 0258  NA 132* 132* 138 136*  140 140  K 3.4* 3.7 3.2* 3.6* 3.7 4.0  CL 97 99 101 101 105 104  CO2 20 20 24 25 25 28   GLUCOSE 107* 137* 121* 128* 121* 111*  BUN 25* 23 20 21 20 19   CREATININE 0.72 0.76 0.65 0.64 0.64 0.65  CALCIUM 7.7* 7.7* 7.8* 7.7* 7.8* 8.3*  MG  --  2.2 2.1  --   --  2.2  PHOS  --  1.6* 2.0*  --   --   --    Liver Function Tests:  Recent Labs Lab 04/02/14 0253 04/03/14 0045 04/04/14 0309 04/06/14 0302 04/07/14 0258  AST 157* 90* 60* 41* 40*  ALT 74* 61* 52 42 39  ALKPHOS 68 83 95 96 117  BILITOT 0.8 1.1 1.7* 0.8 0.9  PROT 6.0 5.9* 5.8* 5.6* 5.7*  ALBUMIN 2.6* 2.3* 2.2* 2.2* 2.3*   Coags:  Recent Labs Lab 04/02/14 0253 04/07/14 1240  INR 1.39 1.26   CBC:  Recent Labs Lab 04/01/14 1414  04/02/14 0253 04/03/14 0045 04/04/14 0309 04/05/14 0417 04/06/14 0302 04/07/14 0258  WBC 16.6*  < > 14.4* 16.5* 18.2* 20.8* 17.4* 15.7*  NEUTROABS 14.3*  --  11.8*  --   --   --   --  13.6*  HGB 19.2*  < > 16.9 16.9 17.5* 16.9 16.2 16.1  HCT 53.8*  < > 47.3 46.6 49.0 47.4 46.0 46.6  MCV 93.7  < > 90.8 89.6 92.1 92.0 93.5 91.9  PLT 96*  < > 98* 86* 129* 199 260 339  < > = values in this interval not displayed.  Cardiac Enzymes:  Recent Labs Lab 04/01/14 1530 04/01/14 2018 04/02/14 0200 04/02/14 0752 04/02/14 0808 04/02/14 1420 04/03/14 0045 04/04/14 0309  CKTOTAL  --  7232* 4522*  --  2528* 1550* 1045* 270*  TROPONINI <0.30 <0.30 <0.30 <0.30  --   --   --   --     Recent Results (from the past 240 hour(s))  URINE CULTURE     Status: None   Collection Time    04/01/14  4:47 PM      Result Value Ref Range Status   Specimen Description URINE, CATHETERIZED   Final   Special Requests ADDED [id number]   Final   Culture  Setup Time     Final   Value: 04/02/2014 05:58     Performed at [facility]     Final   Value: 1,000 COLONIES/ML     Performed at [facility]   Culture     Final   Value: INSIGNIFICANT GROWTH     Performed at [facility]   Report Status 04/03/2014 FINAL   Final  STOOL CULTURE     Status: None   Collection Time    04/01/14  5:17 PM      Result Value Ref Range Status   Specimen Description STOOL   Final   Special Requests NONE   Final   Culture  Final   Value: NO SALMONELLA, SHIGELLA, CAMPYLOBACTER, YERSINIA, OR E.COLI 0157:H7 ISOLATED     Performed at [facility]   Report Status 04/05/2014 FINAL   Final  CLOSTRIDIUM DIFFICILE BY PCR     Status: None   Collection Time    04/01/14  5:17 PM      Result Value Ref Range Status   C difficile by pcr NEGATIVE  NEGATIVE Final  CULTURE, BLOOD (ROUTINE X 2)     Status: None   Collection Time    04/01/14  8:10 PM      Result Value Ref Range Status   Specimen Description BLOOD HAND RIGHT   Final   Special Requests BOTTLES DRAWN AEROBIC AND ANAEROBIC 10CC   Final   Culture  Setup Time     Final   Value: 04/02/2014 00:57     Performed at [facility]   Culture     Final   Value: GROUP B STREP(S.AGALACTIAE)ISOLATED     Note: Gram Stain Report Called to,Read Back By and Verified With: BERNADETTE RONCALLO 04/02/14 1400 BY SMITHERSJ     Performed at [facility]   Report Status 04/04/2014 FINAL   Final   Organism ID, Bacteria GROUP B STREP(S.AGALACTIAE)ISOLATED   Final  CULTURE, BLOOD (ROUTINE X 2)     Status: None   Collection Time    04/01/14  8:18 PM      Result Value Ref Range Status   Specimen Description BLOOD HAND RIGHT   Final   Special Requests BOTTLES DRAWN AEROBIC AND ANAEROBIC 5CC   Final   Culture  Setup Time     Final   Value: 04/02/2014 00:56     Performed at [facility]   Culture     Final   Value: GROUP B STREP(S.AGALACTIAE)ISOLATED     Note: SUSCEPTIBILITIES PERFORMED ON PREVIOUS CULTURE WITHIN THE LAST 5 DAYS.     Note: Gram Stain Report Called to,Read Back By and Verified With: BERNADETTE RONCALLO 04/02/14 1400 BY SMITHERSJ     Performed at [facility]   Report Status 04/04/2014  FINAL   Final  MRSA PCR SCREENING     Status: None   Collection Time    04/01/14  9:42 PM      Result Value Ref Range Status   MRSA by PCR NEGATIVE  NEGATIVE Final   Comment:            The GeneXpert MRSA Assay (FDA     approved for NASAL specimens     only), is one component of a     comprehensive MRSA colonization     surveillance program. It is not     intended to diagnose MRSA     infection nor to guide or     monitor treatment for     MRSA infections.  OVA AND PARASITE EXAMINATION     Status: None   Collection Time    04/01/14 11:50 PM      Result Value Ref Range Status   Specimen Description STOOL   Final   Special Requests NONE   Final   Ova and parasites     Final   Value: NO OVA OR PARASITES SEEN     Performed at [facility]   Report Status 04/06/2014 FINAL   Final  CULTURE, BLOOD (ROUTINE X 2)     Status: None   Collection Time    04/05/14  7:07 PM  Result Value Ref Range Status   Specimen Description BLOOD LEFT ARM   Final   Special Requests BOTTLES DRAWN AEROBIC AND ANAEROBIC 8CC   Final   Culture  Setup Time     Final   Value: 04/06/2014 01:37     Performed at [facility]   Culture     Final   Value:        BLOOD CULTURE RECEIVED NO GROWTH TO DATE CULTURE WILL BE HELD FOR 5 DAYS BEFORE ISSUING A FINAL NEGATIVE REPORT     Performed at [facility]   Report Status PENDING   Incomplete     Studies:  Recent x-ray studies have been reviewed in detail by the Attending Physician  Scheduled Meds:  Scheduled Meds: . sodium chloride   Intravenous Once  . amiodarone  400 mg Oral BID  . antiseptic oral rinse  7 mL Mouth Rinse BID  . diltiazem  360 mg Oral Daily  . losartan  50 mg Oral Daily  . ondansetron  4 mg Intravenous Once  . piperacillin-tazobactam (ZOSYN)  IV  3.375 g Intravenous Q8H  . senna-docusate  1 tablet Oral BID  . sodium chloride  3 mL Intravenous Q12H  . vancomycin  1,000 mg Intravenous Q8H    Time spent on  care of this patient: 35 mins   MCCLUNG,JEFFREY T , MD   [facility] Office  [phone] Pager - Text Page per Shea Evans as per below:  On-Call/Text Page:      [email or web address]      password [email or web address]  If 7PM-7AM, please contact night-coverage [email or web address] Password [email or web address] 04/07/2014, 3:11 PM   LOS: 6 days

## 2014-04-07 NOTE — Interval H&P Note (Signed)
History and Physical Interval Note:  04/07/2014 9:40 AM  [name]  has presented today for surgery, with the diagnosis of ENDOCARDITIS  The various methods of treatment have been discussed with the patient and family. After consideration of risks, benefits and other options for treatment, the patient has consented to  Procedure(s): TRANSESOPHAGEAL ECHOCARDIOGRAM (TEE) (N/A) as a surgical intervention .  The patient's history has been reviewed, patient examined, no change in status, stable for surgery.  I have reviewed the patient's chart and labs.  Questions were answered to the patient's satisfaction.     Kiyana Vazguez

## 2014-04-08 ENCOUNTER — Encounter (HOSPITAL_COMMUNITY): Payer: Self-pay | Admitting: Cardiology

## 2014-04-08 DIAGNOSIS — A409 Streptococcal sepsis, unspecified: Secondary | ICD-10-CM

## 2014-04-08 DIAGNOSIS — K5900 Constipation, unspecified: Secondary | ICD-10-CM

## 2014-04-08 DIAGNOSIS — E872 Acidosis, unspecified: Secondary | ICD-10-CM

## 2014-04-08 DIAGNOSIS — A419 Sepsis, unspecified organism: Secondary | ICD-10-CM

## 2014-04-08 LAB — CBC
HCT: 46.9 % (ref 39.0–52.0)
HEMOGLOBIN: 16.2 g/dL (ref 13.0–17.0)
MCH: 32.5 pg (ref 26.0–34.0)
MCHC: 34.5 g/dL (ref 30.0–36.0)
MCV: 94.2 fL (ref 78.0–100.0)
Platelets: 347 10*3/uL (ref 150–400)
RBC: 4.98 MIL/uL (ref 4.22–5.81)
RDW: 15.5 % (ref 11.5–15.5)
WBC: 13.7 10*3/uL — ABNORMAL HIGH (ref 4.0–10.5)

## 2014-04-08 LAB — BASIC METABOLIC PANEL
Anion gap: 9 (ref 5–15)
BUN: 17 mg/dL (ref 6–23)
CHLORIDE: 102 meq/L (ref 96–112)
CO2: 30 mEq/L (ref 19–32)
Calcium: 8.3 mg/dL — ABNORMAL LOW (ref 8.4–10.5)
Creatinine, Ser: 0.64 mg/dL (ref 0.50–1.35)
GFR calc Af Amer: >90 mL/min (ref >90)
GFR calc non Af Amer: >90 mL/min (ref >90)
Glucose, Bld: 100 mg/dL — ABNORMAL HIGH (ref 70–99)
POTASSIUM: 3.9 meq/L (ref 3.7–5.3)
Sodium: 141 mEq/L (ref 137–147)

## 2014-04-08 MED ORDER — BISACODYL 5 MG PO TBEC
5.0000 mg | DELAYED_RELEASE_TABLET | Freq: Three times a day (TID) | ORAL | Status: DC
  Administered 2014-04-08 – 2014-04-13 (×7): 5 mg via ORAL
  Filled 2014-04-08 (×15): qty 1

## 2014-04-08 MED ORDER — CEFTRIAXONE SODIUM 2 G IJ SOLR
2.0000 g | INTRAMUSCULAR | Status: DC
  Administered 2014-04-08 – 2014-04-14 (×7): 2 g via INTRAVENOUS
  Filled 2014-04-08 (×8): qty 2

## 2014-04-08 NOTE — Progress Notes (Signed)
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 Appendectomy July 2014, lumbar degenerative disease s/p lumbar surgery  X 3 Last surgery 2009.  Presented with nausea/vomiting/diarrhea, back pain, and A. Fib with RVR. Pt had progressive nausea, vomiting and diarrhea over 2-3 days. Also developed new onset back pain. Was at home and slid down the bed. Was unable to get up from the floor for several hours because of weakness.  In the ER patient noted to be in A. Fib with heart rate into the 140s. Blood pressure in the 100s to 130s. Satting greater than 92% on room air. Notable labs include a white blood cell count 16.6, hemoglobin 19.2, platelets 96, sodium 131, creatinine 1.0, BUN 31, AST 254, ALT 89. Lactate 2.45. CK 12k. Trop 0.09, then <0.3. UA mildly indicative of infection. L. Spine x-ray noted general lumbar degeneration. Chest x-ray within normal limits.   HPI/Subjective: 9/24 states continues to have back pain but decreased from previous day. Is complaining of constipation(most likely secondary to pain medication). Very interested in reinitiating physical therapy.    Assessment/Plan: Sepsis due to Goup B Strep - 2/2 blood cx - source unclear  -Urine cx not convincing - UA equivocal -9/22 spoke with Dr. Logan Bores (radiology), patient will require neurosurgery consult. Secondary to findings in the L-spine; see MRI results below -L2-3 to L5-S1 disc spaces with marrow edema suspicious for discitis/osteomyelitis specifically at L2-3 and L4-5" with no evidence of infection in the thoracic spine  - TEE 9/23 notes no vegetations  - plan is to complete 6 weeks of IV Rocephin (will need PICC when cleared for same per ID)  - f/u imaging should be accomplished at that time - in absence of abscess, surgical tx would not  likely be helpful at this time (spoke w/ NS on call who agreed that IV abx for full course of 6-8 weeks w/ f/u imaging would be c/w his recommendations)   Lactic acidosis  -Resolved   Newly diagnosed Afib w/ RVR  -Currently in NSR  -Neurosurgery not going to perform procedure heparin drip DC'd and Eliquis restarted   Rhabdomyolysis  -Ck peak 12k ,resolved   Guaiac positive  -In setting of diarrhea, significance of this is not clear  - Hgb stable / not anemic   Transaminitis  -likely shock liver, resolved    Osteomyelitis/phlegmon (Back pain)  -L-spine MRI shows, extensive infection; see MRI results below. -9/23 verbal consult with neurosurgery; recommended no surgical intervention at this time. Concurs with infectious disease recommendation of 6-8 weeks of IV antibiotic  Abdom pain - N/V - diarrhea  -resolved   Thrombocytopenia  -Resolved    Morbid obesity - Body mass index is 30.18 kg/(m^2).  Constipation -Bisacodyl 5 mg TID    Code Status: FULL  Family Communication: no family present at time of exam  Disposition Plan: SDU    Consultants: Dr. Peter Martinique (Cardiology)  Dr. Bobby Rumpf (infectious disease)  Dr. Roland Rack (Neurology)     Procedure/Significant Events: 9/22 T-spine/L-spine MRI - Prominent fluid signal L2-3 to L5-S1 disc spaces with marrow edema suspicious for discitis/osteomyelitis specifically at L2-3 and L4-5.  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  - Facet joint fluid at L1-2, L2-3, and L3-4 chronic  facet arthritis vs septic arthritis particularly at L2-3.  9/23 TEE;- LVEF=55% to 60%.  - Left atrium: No evidence of thrombus in the atrial cavity or appendage. - Right atrium: No evidence of thrombus in the atrial cavity or appendage.     Culture 9/17 blood right hand x2 positive GROUP B STREP(S.AGALACTIAE) 9/17 stool  negative 9/17 stool negative O&P 9/17 C. difficile by PCR negative 9/17 urine negative 9/21 blood pending   Antibiotics: Zosyn 9/17 > 9/20 ; restarted 9/22>> stopped 9/23 Vancomycin 9/22>> stopped 9/23 Rocephin 9/20 > stopped 9/22; restarted 9/24>> stop date 6-8 weeks   DVT prophylaxis: Eliquis   Devices NA   LINES / TUBES:      Continuous Infusions: . sodium chloride 50 mL/hr at 04/07/14 1704    Objective: VITAL SIGNS: Temp: 98.6 F (37 C) (09/24 1246) Temp src: Oral (09/24 1246) BP: 146/71 mmHg (09/24 1246) Pulse Rate: 85 (09/24 1246) SPO2; FIO2:   Intake/Output Summary (Last 24 hours) at 04/08/14 1506 Last data filed at 04/08/14 1300  Gross per 24 hour  Intake    753 ml  Output   2600 ml  Net  -1847 ml     Exam: General: A./O. x4, continued L-spine back pain, however significantly decreased from 48 hours ago, No acute respiratory distress Lungs: Clear to auscultation bilaterally without wheezes or crackles Cardiovascular: Regular rate and rhythm without murmur gallop or rub normal S1 and S2 Abdomen: Nontender, nondistended, soft, bowel sounds positive, no rebound, no ascites, no appreciable mass, mild pain to palpation/bilateral straight leg raise left CVA area  Extremities: No significant cyanosis, clubbing, or edema bilateral lower extremities Neurologic; pupils equal reactive to light and accommodation, tongue/uvula midline, upper extremity strength 5/5 bilateral, bilateral lower extremity strength 4/5 (secondary to pain?), Sensation intact throughout, did not ambulate patient secondary to lower extremity weakness/L-spine pain.  Data Reviewed: Basic Metabolic Panel:  Recent Labs Lab 04/02/14 0253 04/03/14 0045 04/04/14 0309 04/05/14 0417 04/06/14 0302 04/07/14 0258 04/08/14 0426  NA 132* 132* 138 136* 140 140 141  K 3.4* 3.7 3.2* 3.6* 3.7 4.0 3.9  CL 97 99 101 101 105 104 102  CO2 20 20 24 25 25 28 30   GLUCOSE 107* 137* 121* 128* 121*  111* 100*  BUN 25* 23 20 21 20 19 17   CREATININE 0.72 0.76 0.65 0.64 0.64 0.65 0.64  CALCIUM 7.7* 7.7* 7.8* 7.7* 7.8* 8.3* 8.3*  MG  --  2.2 2.1  --   --  2.2  --   PHOS  --  1.6* 2.0*  --   --   --   --    Liver Function Tests:  Recent Labs Lab 04/02/14 0253 04/03/14 0045 04/04/14 0309 04/06/14 0302 04/07/14 0258  AST 157* 90* 60* 41* 40*  ALT 74* 61* 52 42 39  ALKPHOS 68 83 95 96 117  BILITOT 0.8 1.1 1.7* 0.8 0.9  PROT 6.0 5.9* 5.8* 5.6* 5.7*  ALBUMIN 2.6* 2.3* 2.2* 2.2* 2.3*   No results found for this basename: LIPASE, AMYLASE,  in the last 168 hours No results found for this basename: AMMONIA,  in the last 168 hours CBC:  Recent Labs Lab 04/02/14 0253  04/04/14 0309 04/05/14 0417 04/06/14 0302 04/07/14 0258 04/08/14 0426  WBC 14.4*  < > 18.2* 20.8* 17.4* 15.7* 13.7*  NEUTROABS 11.8*  --   --   --   --  13.6*  --   HGB 16.9  < > 17.5* 16.9 16.2 16.1 16.2  HCT 47.3  < > 49.0 47.4 46.0 46.6 46.9  MCV 90.8  < > 92.1 92.0 93.5 91.9 94.2  PLT 98*  < > 129* 199 260 339 347  < > = values in this interval not displayed. Cardiac Enzymes:  Recent Labs Lab 04/01/14 1530  04/01/14 2018 04/02/14 0200 04/02/14 0752 04/02/14 0808 04/02/14 1420 04/03/14 0045 04/04/14 0309  CKTOTAL  --   < > 7232* 4522*  --  2528* 1550* 1045* 270*  TROPONINI <0.30  --  <0.30 <0.30 <0.30  --   --   --   --   < > = values in this interval not displayed. BNP (last 3 results)  Recent Labs  04/01/14 2018  PROBNP 2858.0*   CBG: No results found for this basename: GLUCAP,  in the last 168 hours  Recent Results (from the past 240 hour(s))  URINE CULTURE     Status: None   Collection Time    04/01/14  4:47 PM      Result Value Ref Range Status   Specimen Description URINE, CATHETERIZED   Final   Special Requests ADDED [id number]   Final   Culture  Setup Time     Final   Value: 04/02/2014 05:58     Performed at [facility]     Final   Value: 1,000  COLONIES/ML     Performed at [facility]   Culture     Final   Value: INSIGNIFICANT GROWTH     Performed at [facility]   Report Status 04/03/2014 FINAL   Final  STOOL CULTURE     Status: None   Collection Time    04/01/14  5:17 PM      Result Value Ref Range Status   Specimen Description STOOL   Final   Special Requests NONE   Final   Culture     Final   Value: NO SALMONELLA, SHIGELLA, CAMPYLOBACTER, YERSINIA, OR E.COLI 0157:H7 ISOLATED     Performed at [facility]   Report Status 04/05/2014 FINAL   Final  CLOSTRIDIUM DIFFICILE BY PCR     Status: None   Collection Time    04/01/14  5:17 PM      Result Value Ref Range Status   C difficile by pcr NEGATIVE  NEGATIVE Final  CULTURE, BLOOD (ROUTINE X 2)     Status: None   Collection Time    04/01/14  8:10 PM      Result Value Ref Range Status   Specimen Description BLOOD HAND RIGHT   Final   Special Requests BOTTLES DRAWN AEROBIC AND ANAEROBIC 10CC   Final   Culture  Setup Time     Final   Value: 04/02/2014 00:57     Performed at [facility]   Culture     Final   Value: GROUP B STREP(S.AGALACTIAE)ISOLATED     Note: Gram Stain Report Called to,Read Back By and Verified With: BERNADETTE RONCALLO 04/02/14 1400 BY SMITHERSJ     Performed at [facility]   Report Status 04/04/2014 FINAL   Final   Organism ID, Bacteria GROUP B STREP(S.AGALACTIAE)ISOLATED   Final  CULTURE, BLOOD (ROUTINE X 2)     Status: None   Collection Time    04/01/14  8:18 PM      Result Value Ref Range Status   Specimen Description BLOOD HAND RIGHT   Final   Special Requests BOTTLES DRAWN AEROBIC  AND ANAEROBIC 5CC   Final   Culture  Setup Time     Final   Value: 04/02/2014 00:56     Performed at [facility]   Culture     Final   Value: GROUP B STREP(S.AGALACTIAE)ISOLATED     Note: SUSCEPTIBILITIES PERFORMED ON PREVIOUS CULTURE WITHIN THE LAST 5 DAYS.     Note: Gram Stain Report Called to,Read Back By and  Verified With: BERNADETTE RONCALLO 04/02/14 1400 BY SMITHERSJ     Performed at [facility]   Report Status 04/04/2014 FINAL   Final  MRSA PCR SCREENING     Status: None   Collection Time    04/01/14  9:42 PM      Result Value Ref Range Status   MRSA by PCR NEGATIVE  NEGATIVE Final   Comment:            The GeneXpert MRSA Assay (FDA     approved for NASAL specimens     only), is one component of a     comprehensive MRSA colonization     surveillance program. It is not     intended to diagnose MRSA     infection nor to guide or     monitor treatment for     MRSA infections.  OVA AND PARASITE EXAMINATION     Status: None   Collection Time    04/01/14 11:50 PM      Result Value Ref Range Status   Specimen Description STOOL   Final   Special Requests NONE   Final   Ova and parasites     Final   Value: NO OVA OR PARASITES SEEN     Performed at [facility]   Report Status 04/06/2014 FINAL   Final  CULTURE, BLOOD (ROUTINE X 2)     Status: None   Collection Time    04/05/14  7:07 PM      Result Value Ref Range Status   Specimen Description BLOOD LEFT ARM   Final   Special Requests BOTTLES DRAWN AEROBIC AND ANAEROBIC 8CC   Final   Culture  Setup Time     Final   Value: 04/06/2014 01:37     Performed at [facility]   Culture     Final   Value:        BLOOD CULTURE RECEIVED NO GROWTH TO DATE CULTURE WILL BE HELD FOR 5 DAYS BEFORE ISSUING A FINAL NEGATIVE REPORT     Performed at [facility]   Report Status PENDING   Incomplete     Studies:  Recent x-ray studies have been reviewed in detail by the Attending Physician  Scheduled Meds:  Scheduled Meds: . amiodarone  400 mg Oral BID  . antiseptic oral rinse  7 mL Mouth Rinse BID  . apixaban  5 mg Oral BID  . cefTRIAXone (ROCEPHIN)  IV  2 g Intravenous Q24H  . diltiazem  360 mg Oral Daily  . losartan  50 mg Oral Daily  . senna-docusate  1 tablet Oral BID  . sodium chloride  3 mL Intravenous  Q12H    Time spent on care of this patient: 40 mins   Allie Bossier , MD   [facility] Office  [phone] Pager - [phone]  On-Call/Text Page:      [email or web address]      password [email or web address]  If 7PM-7AM, please contact night-coverage [email or web address] Password [email or web address] 04/08/2014, 3:06 PM   LOS: 7 days

## 2014-04-08 NOTE — Progress Notes (Signed)
Patient Name: [name] Date of Encounter: 04/08/2014     Active Problems:   Dehydration   Leukocytosis   Atrial fibrillation with rapid ventricular response   Abdominal pain, generalized   Nausea vomiting and diarrhea   Back pain   Transaminitis   Sepsis   Thrombocytopenia, unspecified    SUBJECTIVE  Denies any CP or SOB. Continue to have back pain. Wants to get up with PT.  CURRENT MEDS . amiodarone  400 mg Oral BID  . antiseptic oral rinse  7 mL Mouth Rinse BID  . apixaban  5 mg Oral BID  . cefTRIAXone (ROCEPHIN)  IV  1 g Intravenous Q24H  . diltiazem  360 mg Oral Daily  . losartan  50 mg Oral Daily  . senna-docusate  1 tablet Oral BID  . sodium chloride  3 mL Intravenous Q12H    OBJECTIVE  Filed Vitals:   04/08/14 0000 04/08/14 0400 04/08/14 0500 04/08/14 0624  BP: 160/65 161/71  157/71  Pulse: 83 83  82  Temp: 98.4 F (36.9 C) 98.3 F (36.8 C)    TempSrc: Oral Oral    Resp: 17 19  19   Height:      Weight:   217 lb 2.5 oz (98.5 kg)   SpO2: 99% 99%  96%    Intake/Output Summary (Last 24 hours) at 04/08/14 0726 Last data filed at 04/08/14 0600  Gross per 24 hour  Intake    600 ml  Output   1000 ml  Net   -400 ml   Filed Weights   04/06/14 0413 04/07/14 0500 04/08/14 0500  Weight: 209 lb 7 oz (95 kg) 225 lb 12 oz (102.4 kg) 217 lb 2.5 oz (98.5 kg)    PHYSICAL EXAM  General: Pleasant, NAD.  Neuro: Alert and oriented X 3. Moves all extremities spontaneously. Psych: Normal affect. HEENT:  Normal  Neck: Supple without bruits or JVD. Lungs:  Resp regular and unlabored, CTA. Heart: RRR no s3, s4, or murmurs. Abdomen: Soft, non-tender. Mildly distended.  Extremities: No clubbing, cyanosis or edema. DP/PT/Radials 2+ and equal bilaterally.  Accessory Clinical Findings  CBC  Recent Labs  04/07/14 0258 04/08/14 0426  WBC 15.7* 13.7*  NEUTROABS 13.6*  --   HGB 16.1 16.2  HCT 46.6 46.9  MCV 91.9 94.2  PLT 339 258   Basic Metabolic  Panel  Recent Labs  04/07/14 0258 04/08/14 0426  NA 140 141  K 4.0 3.9  CL 104 102  CO2 28 30  GLUCOSE 111* 100*  BUN 19 17  CREATININE 0.65 0.64  CALCIUM 8.3* 8.3*  MG 2.2  --    Liver Function Tests  Recent Labs  04/06/14 0302 04/07/14 0258  AST 41* 40*  ALT 42 39  ALKPHOS 96 117  BILITOT 0.8 0.9  PROT 5.6* 5.7*  ALBUMIN 2.2* 2.3*   Cardiac Enzymes No results found for this basename: CKTOTAL, CKMB, CKMBINDEX, TROPONINI,  in the last 72 hours  TELE NSR with HR 70-80s, no significant ventricular ectopy. No recurrent Afib.    ECG  No new EKG  Echocardiogram  04/03/2014 Study Conclusions  - Left ventricle: The cavity size was normal. The study is not technically sufficient to allow evaluation of LV diastolic function. LVEF is probably normal. Wall motion cannot be analyzed. - Left atrium: The atrium was mildly to moderately dilated. - Very limited study due to obesity and extreme tachycardia. The study should be repeated with microbubble contrast when the heart  rate is better controlled.     ZOX:WRUEA Conclusions  - Left ventricle: Systolic function was normal. The estimated ejection fraction was in the range of 55% to 60%. Wall motion was normal; there were no regional wall motion abnormalities. - Left atrium: No evidence of thrombus in the atrial cavity or appendage. - Right atrium: No evidence of thrombus in the atrial cavity or appendage.  Impressions:  - No evidence of endocarditis.   Radiology/Studies  Dg Chest 2 View  04/01/2014   CLINICAL DATA:  Shortness of breath.  Cough.  EXAM: CHEST  2 VIEW  COMPARISON:  CT scan dated 08/23/2005  FINDINGS: Heart size and pulmonary vascularity are normal. Lungs are clear. No acute osseous abnormality. Prominent right first costochondral junction.  IMPRESSION: No active cardiopulmonary disease.   Electronically Signed   By: Rozetta Nunnery M.D.   On: 04/01/2014 17:25   Dg Lumbar Spine  Complete  04/01/2014   CLINICAL DATA:  Low back pain.  EXAM: LUMBAR SPINE - COMPLETE 4+ VIEW  COMPARISON:  CT abdomen and pelvis 01/25/2013  FINDINGS: Mild lumbar levoscoliosis his again seen with severe asymmetric right-sided disc space narrowing at L2-3 and L3-4 with associated vertebral body sclerosis and bridging osteophytes. 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 Trace retrolisthesis of L2 on L3 is unchanged and likely degenerative. There is no evidence of compression fracture. Atherosclerotic aortic calcification is noted.  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 No acute osseous abnormality identified.   Electronically Signed   By: Logan Bores   On: 04/01/2014 17:27   Ct Abdomen Pelvis W Contrast  04/01/2014   CLINICAL DATA:  Lower back pain and generalized weakness. Vomiting and anorexia.  EXAM: CT ABDOMEN AND PELVIS WITH CONTRAST  TECHNIQUE: Multidetector CT imaging of the abdomen and pelvis was performed using the standard protocol following bolus administration of intravenous contrast.  CONTRAST:  165mL OMNIPAQUE IOHEXOL 300 MG/ML  SOLN  COMPARISON:  CT of the abdomen and pelvis from 01/25/2013  FINDINGS: Minimal bibasilar atelectasis is noted. Scattered coronary artery calcifications are seen.  A stable 2.6 cm hypodensity within the left hepatic lobe likely reflects a cyst. The liver and spleen are otherwise unremarkable. The gallbladder is borderline normal in size, and grossly unremarkable in appearance. The pancreas and adrenal glands are unremarkable.  Diffuse perinephric stranding is noted. As this is new from the prior study, it raises concern for mild diffuse bilateral pyelonephritis.  A 9 mm cyst is noted at the upper pole of the right kidney. Scattered vascular calcifications are noted at the right renal hilum. The kidneys are otherwise unremarkable. There is no evidence of hydronephrosis. No renal or ureteral stones are identified.   Trace free fluid is seen tracking along the mid to distal left ureter, at the left lower quadrant.  The small bowel is unremarkable in appearance. The stomach is within normal limits. No acute vascular abnormalities are seen.  Indication is status post appendectomy. Contrast progresses to the level of the sigmoid colon. The colon is unremarkable in appearance.  The bladder is mildly distended and grossly unremarkable. The prostate is mildly enlarged, measuring 4.9 cm in transverse dimension. No inguinal lymphadenopathy is seen.  No acute osseous abnormalities are identified. Multilevel vacuum phenomenon, endplate sclerotic change and disc space narrowing are noted along the lumbar spine.  IMPRESSION: 1. Diffuse perinephric stranding, new from the prior study. This raises concern for mild diffuse bilateral pyelonephritis. Would correlate for associated symptoms. 2. Small  right renal cyst and likely hepatic cyst. 3. Scattered coronary artery calcifications seen. 4. Mildly enlarged prostate. 5. Mild degenerative change noted along the lumbar spine.   Electronically Signed   By: Garald Balding M.D.   On: 04/01/2014 21:50   CLINICAL DATA: Bacteremia and back pain.  EXAM:  MRI THORACIC AND LUMBAR SPINE WITHOUT AND WITH CONTRAST  TECHNIQUE:  Multiplanar and multiecho pulse sequences of the thoracic and lumbar  spine were obtained without and with intravenous contrast.  CONTRAST: 88mL MULTIHANCE GADOBENATE DIMEGLUMINE 529 MG/ML IV SOLN  COMPARISON: CT abdomen and pelvis 04/01/2014. Lumbar spine  radiographs 04/01/2014. Chest CT 08/23/2005.  FINDINGS:  MR THORACIC SPINE FINDINGS  Vertebral alignment is within normal limits. No compression fracture  is identified. Scattered, small Schmorl's nodes are noted in the mid  and lower thoracic spine. Mild degenerative endplate changes are  present in the mid and lower thoracic spine. No vertebral marrow  edema is identified, although evaluation is limited by low  signal  through the lower thoracic spine. No abnormal enhancement is  identified. Mild subcutaneous soft tissue edema is noted,  predominantly in the lower back.  Axial images are degraded by motion artifact. Spinal cord is normal  in caliber and grossly normal in signal. Small right central disc  protrusion at T4-5 and small central disc protrusion at T9-10 do not  result in stenosis. More diffuse disc bulging at T10-11 minimally  effaces the ventral thecal sac without significant spinal canal  stenosis. There are small bilateral pleural effusions. Abnormal  signal is present in the right greater than left lower lobes of the  lungs.  MR LUMBAR SPINE FINDINGS  Examination is severely limited by motion artifact and low signal to  noise.  There is straightening of the normal lumbar lordosis. Mild lumbar  levoscoliosis is present. There is trace retrolisthesis of L2 on L3,  unchanged. Moderate to severe disc space narrowing is again seen  from L2-3 to L5-S1. There is prominent T2/ STIR hyperintensity in  the disc spaces at each of these 4 levels. There is also evidence of  marrow edema and enhancement in the right aspects of the vertebral  bodies adjacent to the L2-3 disc space and there is asymmetric fluid  in the right L2-3 facet joint. There is also likely paravertebral  soft tissue edema and enhancement to the right of the L2 and L3  vertebral bodies with some involvement of the psoas muscle.  There is mild marrow edema and enhancement in the vertebral bodies  adjacent to the L4-5 disc space, greater left of midline. There is  abnormal T2 hyperintense, heterogeneously enhancing material in the  ventral epidural space behind the vertebral bodies extending from  the L3-4 disc space level to the S1 vertebral body level, and this  may communicate with the L4-5 disc space. This contributes to  moderate to severe spinal stenosis at and just cranial to the L4-5  disc space. Prior left  hemilaminectomies are identified at L4-5 and  L5-S1.  Asymmetric right facet arthrosis and small facet joint effusions are  also noted at L1-2 and L3-4. Right L5 pars defect is noted. 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 Conus medullaris terminates at T12-L1.  IMPRESSION:  1. Motion degraded study as above. Prominent fluid signal in the the  L2-3 to L5-S1 disc spaces with marrow edema suspicious for  discitis/osteomyelitis specifically at L2-3 and L4-5. Ventral  epidural material extending  from L4-S1 likely represents phlegmon,  potentially arising from the L4-5 disc space and contributing to  prominent spinal stenosis at the L4 level.  2. Facet joint fluid at L1-2, L2-3, and L3-4 may reflect advanced  chronic facet arthritis although septic arthritis cannot be  excluded, particularly at L2-3.  3. No evidence of infection in the thoracic spine.  4. Small bilateral pleural effusions and abnormal signal in the  lower lobes. Evaluation is limited by motion, however pneumonia is a  consideration in addition to atelectasis. Repeat chest radiographs  are recommended.  These results were called by telephone at the time of interpretation  on 04/06/2014 at 5:09 pm to Dr. Sherral Hammers, who verbally acknowledged  these results.  Electronically Signed  By: Logan Bores  On: 04/06/2014 17:11  ASSESSMENT AND PLAN  1. A-fib with RVR  - converted to NSR on amio, will switch to 400mg  BID for 1 week, then 200mg  BID afterward  - On long acting Cardizem  - Eliquis for anticoagulation   2. Bacteremia- Strep: ID on board,  TEE negative for vegetation  3. N/V and diarrhea with dehydration: hydrated  4. 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   5. Leukocytosis: secondary to #2  - improving on abx  6. HTN: still elevated despite Cardizem. Will add losartan 50 mg daily. Can increase further if needed.  I  will sign off at this point. Will need cardiology follow up post discharge to monitor amiodarone therapy.  Signed,  Peter Martinique, [place] 04/08/2014 7:26 AM

## 2014-04-09 MED ORDER — METHOCARBAMOL 500 MG PO TABS
500.0000 mg | ORAL_TABLET | Freq: Three times a day (TID) | ORAL | Status: DC
  Administered 2014-04-09 – 2014-04-15 (×17): 500 mg via ORAL
  Filled 2014-04-09 (×19): qty 1

## 2014-04-09 MED ORDER — OXYCODONE HCL ER 10 MG PO T12A
10.0000 mg | EXTENDED_RELEASE_TABLET | Freq: Two times a day (BID) | ORAL | Status: DC
  Administered 2014-04-09 – 2014-04-15 (×12): 10 mg via ORAL
  Filled 2014-04-09 (×12): qty 1

## 2014-04-09 NOTE — Progress Notes (Signed)
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 Appendectomy July 2014, lumbar degenerative disease s/p lumbar surgery  X 3 Last surgery 2009.  Presented with nausea/vomiting/diarrhea, back pain, and A. Fib with RVR. Pt had progressive nausea, vomiting and diarrhea over 2-3 days. Also developed new onset back pain. Was at home and slid down the bed. Was unable to get up from the floor for several hours because of weakness.  In the ER patient noted to be in A. Fib with heart rate into the 140s. Blood pressure in the 100s to 130s. Satting greater than 92% on room air. Notable labs include a white blood cell count 16.6, hemoglobin 19.2, platelets 96, sodium 131, creatinine 1.0, BUN 31, AST 254, ALT 89. Lactate 2.45. CK 12k. Trop 0.09, then <0.3. UA mildly indicative of infection. L. Spine x-ray noted general lumbar degeneration. Chest x-ray within normal limits.   HPI/Subjective: 9/25 states continues to have back pain but decreased from previous day. Very interested in reinitiating physical therapy, however on able to sit up on side of bed without excruciating pain rated at 8/10.    Assessment/Plan: Sepsis due to Goup B Strep - 2/2 blood cx - source unclear  -Urine cx not convincing - UA equivocal -9/22 spoke with Dr. Logan Bores (radiology), patient will require neurosurgery consult. Secondary to findings in the L-spine; see MRI results below -L2-3 to L5-S1 disc spaces with marrow edema suspicious for discitis/osteomyelitis specifically at L2-3 and L4-5" with no evidence of infection in the thoracic spine  - TEE 9/23 notes no vegetations  - plan is to complete 6 weeks of IV Rocephin (will need PICC when cleared for same per ID)  - f/u imaging should be accomplished at that time - in absence of abscess, surgical tx  would not likely be helpful at this time (spoke w/ NS on call who agreed that IV abx for full course of 6-8 weeks w/ f/u imaging would be c/w his recommendations)   Back pain -See sepsis -Start Robaxin 500 mg TID -Start oxycodone 10 mg BID -Counseled patient that we wouldn't be able to get his pain to 0/10, however if we could get his pain to 4/10 that would be considered control and would help him participate as he wishes to continue in physical therapy.  Lactic acidosis  -Resolved   Newly diagnosed Afib w/ RVR  -Currently in NSR  -Neurosurgery not going to perform procedure heparin drip DC'd and Eliquis restarted   Rhabdomyolysis  -Ck peak 12k ,resolved   Guaiac positive  -In setting of diarrhea, significance of this is not clear  - Hgb stable / not anemic   Transaminitis  -likely shock liver, resolved    Osteomyelitis/phlegmon (Back pain)  -L-spine MRI shows, extensive infection; see MRI results below. -9/23 verbal consult with neurosurgery; recommended no surgical intervention at this time. Concurs with infectious disease recommendation of 6-8 weeks of IV antibiotic  Abdom pain - N/V - diarrhea  -resolved   Thrombocytopenia  -Resolved    Morbid obesity - Body mass index is 30.18 kg/(m^2).  Constipation -Bisacodyl 5 mg TID    Code Status: FULL  Family Communication: no family present at time of exam  Disposition Plan: SDU    Consultants: Dr. Peter Martinique (Cardiology)  Dr. Bobby Rumpf (infectious disease)  Dr. Roland Rack (Neurology)  Procedure/Significant Events: 9/22 T-spine/L-spine MRI - Prominent fluid signal L2-3 to L5-S1 disc spaces with marrow edema suspicious for discitis/osteomyelitis specifically at L2-3 and L4-5.  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  - Facet joint fluid at L1-2, L2-3, and L3-4 chronic facet arthritis vs  septic arthritis particularly at L2-3.  9/23 TEE;- LVEF=55% to 60%.  - Left atrium: No evidence of thrombus in the atrial cavity or appendage. - Right atrium: No evidence of thrombus in the atrial cavity or appendage.     Culture 9/17 blood right hand x2 positive GROUP B STREP(S.AGALACTIAE) 9/17 stool negative 9/17 stool negative O&P 9/17 C. difficile by PCR negative 9/17 urine negative 9/21 blood pending   Antibiotics: Zosyn 9/17 > 9/20 ; restarted 9/22>> stopped 9/23 Vancomycin 9/22>> stopped 9/23 Rocephin 9/20 > stopped 9/22; restarted 9/24>> stop date 6-8 weeks   DVT prophylaxis: Eliquis   Devices NA   LINES / TUBES:      Continuous Infusions: . sodium chloride 50 mL/hr at 04/09/14 1538    Objective: VITAL SIGNS: Temp: 98.2 F (36.8 C) (09/25 2000) Temp src: Oral (09/25 2000) BP: 151/57 mmHg (09/25 2000) Pulse Rate: 78 (09/25 2000) SPO2; FIO2:   Intake/Output Summary (Last 24 hours) at 04/09/14 2122 Last data filed at 04/09/14 2022  Gross per 24 hour  Intake   1390 ml  Output   2750 ml  Net  -1360 ml     Exam: General: A./O. x4, continued L-spine back pain, however significantly decreased except when patient elevates to 45 or greater, No acute respiratory distress Lungs: Clear to auscultation bilaterally without wheezes or crackles Cardiovascular: Regular rate and rhythm without murmur gallop or rub normal S1 and S2 Abdomen: Nontender, nondistended, soft, bowel sounds positive, no rebound, no ascites, no appreciable mass,  Back; moderate pain to palpation/bilateral straight leg raise left CVA area, right paraspinal and proximal muscle spasms; left paraspinal muscle spasm  Extremities: No significant cyanosis, clubbing, or edema bilateral lower extremities Neurologic; pupils equal reactive to light and accommodation, tongue/uvula midline, upper extremity strength 5/5 bilateral, bilateral lower extremity strength 4/5 (secondary to pain?), Sensation  intact throughout, did not ambulate patient secondary to lower extremity weakness/L-spine pain.  Data Reviewed: Basic Metabolic Panel:  Recent Labs Lab 04/03/14 0045 04/04/14 0309 04/05/14 0417 04/06/14 0302 04/07/14 0258 04/08/14 0426  NA 132* 138 136* 140 140 141  K 3.7 3.2* 3.6* 3.7 4.0 3.9  CL 99 101 101 105 104 102  CO2 20 24 25 25 28 30   GLUCOSE 137* 121* 128* 121* 111* 100*  BUN 23 20 21 20 19 17   CREATININE 0.76 0.65 0.64 0.64 0.65 0.64  CALCIUM 7.7* 7.8* 7.7* 7.8* 8.3* 8.3*  MG 2.2 2.1  --   --  2.2  --   PHOS 1.6* 2.0*  --   --   --   --    Liver Function Tests:  Recent Labs Lab 04/03/14 0045 04/04/14 0309 04/06/14 0302 04/07/14 0258  AST 90* 60* 41* 40*  ALT 61* 52 42 39  ALKPHOS 83 95 96 117  BILITOT 1.1 1.7* 0.8 0.9  PROT 5.9* 5.8* 5.6* 5.7*  ALBUMIN 2.3* 2.2* 2.2* 2.3*   No results found for this basename: LIPASE, AMYLASE,  in the last 168 hours No results found for this basename: AMMONIA,  in the last 168 hours CBC:  Recent Labs Lab 04/04/14 0309 04/05/14 0417 04/06/14 0302  04/07/14 0258 04/08/14 0426  WBC 18.2* 20.8* 17.4* 15.7* 13.7*  NEUTROABS  --   --   --  13.6*  --   HGB 17.5* 16.9 16.2 16.1 16.2  HCT 49.0 47.4 46.0 46.6 46.9  MCV 92.1 92.0 93.5 91.9 94.2  PLT 129* 199 260 339 347   Cardiac Enzymes:  Recent Labs Lab 04/03/14 0045 04/04/14 0309  CKTOTAL 1045* 270*   BNP (last 3 results)  Recent Labs  04/01/14 2018  PROBNP 2858.0*   CBG: No results found for this basename: GLUCAP,  in the last 168 hours  Recent Results (from the past 240 hour(s))  URINE CULTURE     Status: None   Collection Time    04/01/14  4:47 PM      Result Value Ref Range Status   Specimen Description URINE, CATHETERIZED   Final   Special Requests ADDED [id number]   Final   Culture  Setup Time     Final   Value: 04/02/2014 05:58     Performed at [place] [facility]     Final   Value: 1,000 COLONIES/ML     Performed at  [facility]   Culture     Final   Value: INSIGNIFICANT GROWTH     Performed at [facility]   Report Status 04/03/2014 FINAL   Final  STOOL CULTURE     Status: None   Collection Time    04/01/14  5:17 PM      Result Value Ref Range Status   Specimen Description STOOL   Final   Special Requests NONE   Final   Culture     Final   Value: NO SALMONELLA, SHIGELLA, CAMPYLOBACTER, YERSINIA, OR E.COLI 0157:H7 ISOLATED     Performed at [facility]   Report Status 04/05/2014 FINAL   Final  CLOSTRIDIUM DIFFICILE BY PCR     Status: None   Collection Time    04/01/14  5:17 PM      Result Value Ref Range Status   C difficile by pcr NEGATIVE  NEGATIVE Final  CULTURE, BLOOD (ROUTINE X 2)     Status: None   Collection Time    04/01/14  8:10 PM      Result Value Ref Range Status   Specimen Description BLOOD HAND RIGHT   Final   Special Requests BOTTLES DRAWN AEROBIC AND ANAEROBIC 10CC   Final   Culture  Setup Time     Final   Value: 04/02/2014 00:57     Performed at [place]     Final   Value: GROUP B STREP(S.AGALACTIAE)ISOLATED     Note: Gram Stain Report Called to,Read Back By and Verified With: BERNADETTE RONCALLO 04/02/14 1400 BY SMITHERSJ     Performed at [facility]   Report Status 04/04/2014 FINAL   Final   Organism ID, Bacteria GROUP B STREP(S.AGALACTIAE)ISOLATED   Final  CULTURE, BLOOD (ROUTINE X 2)     Status: None   Collection Time    04/01/14  8:18 PM      Result Value Ref Range Status   Specimen Description BLOOD HAND RIGHT   Final   Special Requests BOTTLES DRAWN AEROBIC AND ANAEROBIC 5CC   Final   Culture  Setup Time     Final   Value: 04/02/2014 00:56     Performed at [place]     Final  Value: GROUP B STREP(S.AGALACTIAE)ISOLATED     Note: SUSCEPTIBILITIES PERFORMED ON PREVIOUS CULTURE WITHIN THE LAST 5 DAYS.     Note: Gram Stain Report Called to,Read Back By and Verified With: BERNADETTE  RONCALLO 04/02/14 1400 BY SMITHERSJ     Performed at [facility]   Report Status 04/04/2014 FINAL   Final  MRSA PCR SCREENING     Status: None   Collection Time    04/01/14  9:42 PM      Result Value Ref Range Status   MRSA by PCR NEGATIVE  NEGATIVE Final   Comment:            The GeneXpert MRSA Assay (FDA     approved for NASAL specimens     only), is one component of a     comprehensive MRSA colonization     surveillance program. It is not     intended to diagnose MRSA     infection nor to guide or     monitor treatment for     MRSA infections.  OVA AND PARASITE EXAMINATION     Status: None   Collection Time    04/01/14 11:50 PM      Result Value Ref Range Status   Specimen Description STOOL   Final   Special Requests NONE   Final   Ova and parasites     Final   Value: NO OVA OR PARASITES SEEN     Performed at [facility]   Report Status 04/06/2014 FINAL   Final  CULTURE, BLOOD (ROUTINE X 2)     Status: None   Collection Time    04/05/14  7:07 PM      Result Value Ref Range Status   Specimen Description BLOOD LEFT ARM   Final   Special Requests BOTTLES DRAWN AEROBIC AND ANAEROBIC 8CC   Final   Culture  Setup Time     Final   Value: 04/06/2014 01:37     Performed at [facility]   Culture     Final   Value:        BLOOD CULTURE RECEIVED NO GROWTH TO DATE CULTURE WILL BE HELD FOR 5 DAYS BEFORE ISSUING A FINAL NEGATIVE REPORT     Performed at [facility]   Report Status PENDING   Incomplete     Studies:  Recent x-ray studies have been reviewed in detail by the Attending Physician  Scheduled Meds:  Scheduled Meds: . amiodarone  400 mg Oral BID  . antiseptic oral rinse  7 mL Mouth Rinse BID  . apixaban  5 mg Oral BID  . bisacodyl  5 mg Oral TID  . cefTRIAXone (ROCEPHIN)  IV  2 g Intravenous Q24H  . diltiazem  360 mg Oral Daily  . losartan  50 mg Oral Daily  . senna-docusate  1 tablet Oral BID  . sodium chloride  3 mL Intravenous  Q12H    Time spent on care of this patient: 40 mins   Allie Bossier , MD   [facility] Office  [phone] Pager - [phone]  On-Call/Text Page:      [email or web address]      password [email or web address]  If 7PM-7AM, please contact night-coverage [email or web address] Password [email or web address] 04/09/2014, 9:22 PM   LOS: 8 days

## 2014-04-09 NOTE — Care Management Note (Addendum)
    Page 1 of 2   04/15/2014     2:27:05 PM CARE MANAGEMENT NOTE 04/15/2014  Patient:  [name], [name]   Account Number:  [id number]  Date Initiated:  04/06/2014  Documentation initiated by:  MAYO,HENRIETTA  Subjective/Objective Assessment:   dx sepsis; lives alone    PCP  [name]     Action/Plan:   Anticipated DC Date:  04/16/2014   Anticipated DC Plan:  SKILLED NURSING FACILITY  In-house referral  Clinical Social Worker      DC Planning Services  CM consult      Choice offered to / List presented to:             Status of service:  Completed, signed off Medicare Important Message given?  YES (If response is "NO", the following Medicare IM given date fields will be blank) Date Medicare IM given:  04/06/2014 Medicare IM given by:  MAYO,HENRIETTA Date Additional Medicare IM given:  04/15/2014 Additional Medicare IM given by:  Leonardo Plaia  Discharge Disposition:  [place]  Per UR Regulation:  Reviewed for med. necessity/level of care/duration of stay  If discussed at [place] of Stay Meetings, dates discussed:   04/13/2014  04/15/2014    Comments:  04/15/14 Ellan Lambert, RN, BSN [phone] pt discharging to SNF today, per CSW arrangements.  04/14/14 Ellan Lambert, RN, BSN [phone] CIR declined IP admission.  Pt will need SNF, and CSW aware.  04/12/2014 1230 per benefits check with [employer], Eliquis: $170.62 for 30 day at retail/ no auth required. Pt will receive 30 day free trial of medication. Contacted CSW for SNF placement.  Jonnie Finner RN CCM Case Mgmt phone [phone]  04/12/2014 1200 Pt states he lives at home alone. NCM discussed SNF with pt and he agreeable to placement for long term IV abx treatment. Pt states he does not have adequate assistance at home to administer medication. Jonnie Finner RN CCM Case Mgmt phone [phone]

## 2014-04-10 NOTE — Progress Notes (Signed)
[place] TEAM 1 - Stepdown/ICU TEAM Progress Note  FERLIN FAIRHURST VHQ:[id number] DOB: 1941/03/19 DOA: 04/01/2014 PCP: [name], MD  Admit HPI / Brief Narrative: 73 year old WM PMHx Genital Herpes, Hx appendicitis S./P. Appendectomy July 2014, lumbar degenerative disease s/p lumbar surgery  X 3 Last surgery 2009.  Presented with nausea/vomiting/diarrhea, back pain, and A. Fib with RVR. Pt had progressive nausea, vomiting and diarrhea over 2-3 days. Also developed new onset back pain. Was at home and slid down the bed. Was unable to get up from the floor for several hours because of weakness.  In the ER patient noted to be in A. Fib with heart rate into the 140s. Blood pressure in the 100s to 130s. Satting greater than 92% on room air. Notable labs include a white blood cell count 16.6, hemoglobin 19.2, platelets 96, sodium 131, creatinine 1.0, BUN 31, AST 254, ALT 89. Lactate 2.45. CK 12k. Trop 0.09, then <0.3. UA mildly indicative of infection. L. Spine x-ray noted general lumbar degeneration. Chest x-ray within normal limits.   HPI/Subjective: 9/26 states continues to have back pain but much better controlled then previous day. Willing to attempt to sit in chair today.    Assessment/Plan: Sepsis due to Goup B Strep - 2/2 blood cx - source unclear  -Urine cx not convincing - UA equivocal -9/22 spoke with Dr. Logan Bores (radiology), patient will require neurosurgery consult. Secondary to findings in the L-spine; see MRI results below -L2-3 to L5-S1 disc spaces with marrow edema suspicious for discitis/osteomyelitis specifically at L2-3 and L4-5" with no evidence of infection in the thoracic spine  - TEE 9/23 notes no vegetations  - plan is to complete 6 weeks of IV Rocephin (will need PICC when cleared for same per ID)  - f/u imaging should be accomplished at that time - in absence of abscess, surgical tx would not likely be helpful at this time (spoke w/ NS on call who agreed that IV  abx for full course of 6-8 weeks w/ f/u imaging would be c/w his recommendations)   Back pain -See sepsis -Continue  Robaxin 500 mg TID -Continue oxycodone 10 mg BID -Counseled patient that we wouldn't be able to get his pain to 0/10, however if we could get his pain to 4/10 that would be considered control and would help him participate as he wishes to continue in physical therapy. -Patient able to get to chair today  Lactic acidosis  -Resolved   Newly diagnosed Afib w/ RVR  -Currently in NSR  -Neurosurgery not going to perform procedure continue Eliquis    Rhabdomyolysis  -Ck peak 12k ,resolved   Guaiac positive  -In setting of diarrhea, significance of this is not clear  - Hgb stable / not anemic   Transaminitis  -likely shock liver, resolved    Osteomyelitis/phlegmon (Back pain)  -L-spine MRI shows, extensive infection; see MRI results below. -9/23 verbal consult with neurosurgery; recommended no surgical intervention at this time. Concurs with infectious disease recommendation of 6-8 weeks of IV antibiotic  Abdom pain - N/V - diarrhea  -resolved   Thrombocytopenia  -Resolved    Morbid obesity - Body mass index is 30.18 kg/(m^2).  Constipation - continue Bisacodyl 5 mg TID    Code Status: FULL  Family Communication: no family present at time of exam  Disposition Plan: SDU    Consultants: Dr. Peter Martinique (Cardiology)  Dr. Bobby Rumpf (infectious disease)  Dr. Roland Rack (Neurology)     Procedure/Significant  Events: 9/22 T-spine/L-spine MRI - Prominent fluid signal L2-3 to L5-S1 disc spaces with marrow edema suspicious for discitis/osteomyelitis specifically at L2-3 and L4-5.  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  - Facet joint fluid at L1-2, L2-3, and L3-4 chronic facet arthritis vs septic arthritis particularly at L2-3.  9/23  TEE;- LVEF=55% to 60%.  - Left atrium: No evidence of thrombus in the atrial cavity or appendage. - Right atrium: No evidence of thrombus in the atrial cavity or appendage.     Culture 9/17 blood right hand x2 positive GROUP B STREP(S.AGALACTIAE) 9/17 stool negative 9/17 stool negative O&P 9/17 C. difficile by PCR negative 9/17 urine negative 9/21 blood pending   Antibiotics: Zosyn 9/17 > 9/20 ; restarted 9/22>> stopped 9/23 Vancomycin 9/22>> stopped 9/23 Rocephin 9/20 > stopped 9/22; restarted 9/24>> stop date 6-8 weeks   DVT prophylaxis: Eliquis   Devices NA   LINES / TUBES:      Continuous Infusions: . sodium chloride 50 mL/hr at 04/09/14 1538    Objective: VITAL SIGNS: Temp: 98.1 F (36.7 C) (09/26 1121) Temp src: Oral (09/26 1121) BP: 146/57 mmHg (09/26 1200) Pulse Rate: 78 (09/26 1200) SPO2; FIO2:   Intake/Output Summary (Last 24 hours) at 04/10/14 1323 Last data filed at 04/10/14 1200  Gross per 24 hour  Intake   1370 ml  Output   1600 ml  Net   -230 ml     Exam: General: A./O. x4, continued L-spine back pain, able to sit in chair today, No acute respiratory distress Lungs: Clear to auscultation bilaterally without wheezes or crackles Cardiovascular: Regular rate and rhythm without murmur gallop or rub normal S1 and S2 Abdomen: Nontender, nondistended, soft, bowel sounds positive, no rebound, no ascites, no appreciable mass,  Back; mild pain to palpation/bilateral straight leg raise left CVA area, right paraspinal and proximal muscle spasms; left paraspinal muscle spasm (muscle spasms significantly decreased)  Extremities: No significant cyanosis, clubbing, or edema bilateral lower extremities Neurologic; upper extremity strength 5/5 bilateral, bilateral lower extremity strength 4/5 (secondary to pain), Sensation intact throughout, did not ambulate patient secondary to lower extremity weakness/L-spine pain.  Data Reviewed: Basic Metabolic  Panel:  Recent Labs Lab 04/04/14 0309 04/05/14 0417 04/06/14 0302 04/07/14 0258 04/08/14 0426  NA 138 136* 140 140 141  K 3.2* 3.6* 3.7 4.0 3.9  CL 101 101 105 104 102  CO2 24 25 25 28 30   GLUCOSE 121* 128* 121* 111* 100*  BUN 20 21 20 19 17   CREATININE 0.65 0.64 0.64 0.65 0.64  CALCIUM 7.8* 7.7* 7.8* 8.3* 8.3*  MG 2.1  --   --  2.2  --   PHOS 2.0*  --   --   --   --    Liver Function Tests:  Recent Labs Lab 04/04/14 0309 04/06/14 0302 04/07/14 0258  AST 60* 41* 40*  ALT 52 42 39  ALKPHOS 95 96 117  BILITOT 1.7* 0.8 0.9  PROT 5.8* 5.6* 5.7*  ALBUMIN 2.2* 2.2* 2.3*   No results found for this basename: LIPASE, AMYLASE,  in the last 168 hours No results found for this basename: AMMONIA,  in the last 168 hours CBC:  Recent Labs Lab 04/04/14 0309 04/05/14 0417 04/06/14 0302 04/07/14 0258 04/08/14 0426  WBC 18.2* 20.8* 17.4* 15.7* 13.7*  NEUTROABS  --   --   --  13.6*  --   HGB 17.5* 16.9  16.2 16.1 16.2  HCT 49.0 47.4 46.0 46.6 46.9  MCV 92.1 92.0 93.5 91.9 94.2  PLT 129* 199 260 339 347   Cardiac Enzymes:  Recent Labs Lab 04/04/14 0309  CKTOTAL 270*   BNP (last 3 results)  Recent Labs  04/01/14 2018  PROBNP 2858.0*   CBG: No results found for this basename: GLUCAP,  in the last 168 hours  Recent Results (from the past 240 hour(s))  URINE CULTURE     Status: None   Collection Time    04/01/14  4:47 PM      Result Value Ref Range Status   Specimen Description URINE, CATHETERIZED   Final   Special Requests ADDED [id number]   Final   Culture  Setup Time     Final   Value: 04/02/2014 05:58     Performed at [place]     Final   Value: 1,000 COLONIES/ML     Performed at [facility]   Culture     Final   Value: INSIGNIFICANT GROWTH     Performed at [facility]   Report Status 04/03/2014 FINAL   Final  STOOL CULTURE     Status: None   Collection Time    04/01/14  5:17 PM      Result Value Ref  Range Status   Specimen Description STOOL   Final   Special Requests NONE   Final   Culture     Final   Value: NO SALMONELLA, SHIGELLA, CAMPYLOBACTER, YERSINIA, OR E.COLI 0157:H7 ISOLATED     Performed at [facility]   Report Status 04/05/2014 FINAL   Final  CLOSTRIDIUM DIFFICILE BY PCR     Status: None   Collection Time    04/01/14  5:17 PM      Result Value Ref Range Status   C difficile by pcr NEGATIVE  NEGATIVE Final  CULTURE, BLOOD (ROUTINE X 2)     Status: None   Collection Time    04/01/14  8:10 PM      Result Value Ref Range Status   Specimen Description BLOOD HAND RIGHT   Final   Special Requests BOTTLES DRAWN AEROBIC AND ANAEROBIC 10CC   Final   Culture  Setup Time     Final   Value: 04/02/2014 00:57     Performed at [place]     Final   Value: GROUP B STREP(S.AGALACTIAE)ISOLATED     Note: Gram Stain Report Called to,Read Back By and Verified With: BERNADETTE RONCALLO 04/02/14 1400 BY SMITHERSJ     Performed at [facility]   Report Status 04/04/2014 FINAL   Final   Organism ID, Bacteria GROUP B STREP(S.AGALACTIAE)ISOLATED   Final  CULTURE, BLOOD (ROUTINE X 2)     Status: None   Collection Time    04/01/14  8:18 PM      Result Value Ref Range Status   Specimen Description BLOOD HAND RIGHT   Final   Special Requests BOTTLES DRAWN AEROBIC AND ANAEROBIC 5CC   Final   Culture  Setup Time     Final   Value: 04/02/2014 00:56     Performed at [facility]   Culture     Final   Value: GROUP B STREP(S.AGALACTIAE)ISOLATED     Note: SUSCEPTIBILITIES PERFORMED ON PREVIOUS CULTURE WITHIN THE LAST 5 DAYS.     Note: Gram Stain Report Called to,Read Back By and Verified  With: BERNADETTE RONCALLO 04/02/14 1400 BY SMITHERSJ     Performed at [facility]   Report Status 04/04/2014 FINAL   Final  MRSA PCR SCREENING     Status: None   Collection Time    04/01/14  9:42 PM      Result Value Ref Range Status   MRSA by PCR NEGATIVE   NEGATIVE Final   Comment:            The GeneXpert MRSA Assay (FDA     approved for NASAL specimens     only), is one component of a     comprehensive MRSA colonization     surveillance program. It is not     intended to diagnose MRSA     infection nor to guide or     monitor treatment for     MRSA infections.  OVA AND PARASITE EXAMINATION     Status: None   Collection Time    04/01/14 11:50 PM      Result Value Ref Range Status   Specimen Description STOOL   Final   Special Requests NONE   Final   Ova and parasites     Final   Value: NO OVA OR PARASITES SEEN     Performed at [facility]   Report Status 04/06/2014 FINAL   Final  CULTURE, BLOOD (ROUTINE X 2)     Status: None   Collection Time    04/05/14  7:07 PM      Result Value Ref Range Status   Specimen Description BLOOD LEFT ARM   Final   Special Requests BOTTLES DRAWN AEROBIC AND ANAEROBIC 8CC   Final   Culture  Setup Time     Final   Value: 04/06/2014 01:37     Performed at [facility]   Culture     Final   Value:        BLOOD CULTURE RECEIVED NO GROWTH TO DATE CULTURE WILL BE HELD FOR 5 DAYS BEFORE ISSUING A FINAL NEGATIVE REPORT     Performed at [facility]   Report Status PENDING   Incomplete     Studies:  Recent x-ray studies have been reviewed in detail by the Attending Physician  Scheduled Meds:  Scheduled Meds: . amiodarone  400 mg Oral BID  . antiseptic oral rinse  7 mL Mouth Rinse BID  . apixaban  5 mg Oral BID  . bisacodyl  5 mg Oral TID  . cefTRIAXone (ROCEPHIN)  IV  2 g Intravenous Q24H  . diltiazem  360 mg Oral Daily  . losartan  50 mg Oral Daily  . methocarbamol  500 mg Oral TID  . OxyCODONE  10 mg Oral Q12H  . senna-docusate  1 tablet Oral BID  . sodium chloride  3 mL Intravenous Q12H    Time spent on care of this patient: 40 mins   Allie Bossier , MD   [facility] Office  [phone] Pager - [phone]  On-Call/Text Page:      [email or web address]       password [email or web address]  If 7PM-7AM, please contact night-coverage [email or web address] Password [email or web address] 04/10/2014, 1:23 PM   LOS: 9 days

## 2014-04-11 ENCOUNTER — Inpatient Hospital Stay (HOSPITAL_COMMUNITY): Payer: Medicare HMO

## 2014-04-11 DIAGNOSIS — D72829 Elevated white blood cell count, unspecified: Secondary | ICD-10-CM

## 2014-04-11 LAB — COMPREHENSIVE METABOLIC PANEL
ALT: 26 U/L (ref 0–53)
AST: 27 U/L (ref 0–37)
Albumin: 2.4 g/dL — ABNORMAL LOW (ref 3.5–5.2)
Alkaline Phosphatase: 101 U/L (ref 39–117)
Anion gap: 11 (ref 5–15)
BUN: 14 mg/dL (ref 6–23)
CALCIUM: 8.5 mg/dL (ref 8.4–10.5)
CO2: 30 meq/L (ref 19–32)
Chloride: 101 mEq/L (ref 96–112)
Creatinine, Ser: 0.58 mg/dL (ref 0.50–1.35)
GFR calc Af Amer: >90 mL/min (ref >90)
GFR calc non Af Amer: >90 mL/min (ref >90)
Glucose, Bld: 105 mg/dL — ABNORMAL HIGH (ref 70–99)
Potassium: 3.7 mEq/L (ref 3.7–5.3)
SODIUM: 142 meq/L (ref 137–147)
TOTAL PROTEIN: 6.2 g/dL (ref 6.0–8.3)
Total Bilirubin: 0.6 mg/dL (ref 0.3–1.2)

## 2014-04-11 LAB — CBC WITH DIFFERENTIAL/PLATELET
Basophils Absolute: 0 10*3/uL (ref 0.0–0.1)
Basophils Relative: 0 % (ref 0–1)
EOS ABS: 0 10*3/uL (ref 0.0–0.7)
EOS PCT: 0 % (ref 0–5)
HCT: 47.6 % (ref 39.0–52.0)
HEMOGLOBIN: 16.1 g/dL (ref 13.0–17.0)
Lymphocytes Relative: 7 % — ABNORMAL LOW (ref 12–46)
Lymphs Abs: 0.9 10*3/uL (ref 0.7–4.0)
MCH: 32.8 pg (ref 26.0–34.0)
MCHC: 33.8 g/dL (ref 30.0–36.0)
MCV: 96.9 fL (ref 78.0–100.0)
MONOS PCT: 8 % (ref 3–12)
Monocytes Absolute: 1 10*3/uL (ref 0.1–1.0)
Neutro Abs: 10.3 10*3/uL — ABNORMAL HIGH (ref 1.7–7.7)
Neutrophils Relative %: 85 % — ABNORMAL HIGH (ref 43–77)
Platelets: 384 10*3/uL (ref 150–400)
RBC: 4.91 MIL/uL (ref 4.22–5.81)
RDW: 15.4 % (ref 11.5–15.5)
WBC: 12.2 10*3/uL — ABNORMAL HIGH (ref 4.0–10.5)

## 2014-04-11 LAB — MAGNESIUM: Magnesium: 2 mg/dL (ref 1.5–2.5)

## 2014-04-11 MED ORDER — HYDRALAZINE HCL 25 MG PO TABS
25.0000 mg | ORAL_TABLET | Freq: Four times a day (QID) | ORAL | Status: DC
  Administered 2014-04-11 – 2014-04-15 (×17): 25 mg via ORAL
  Filled 2014-04-11 (×21): qty 1

## 2014-04-11 NOTE — Progress Notes (Addendum)
[place] TEAM 1 - Stepdown/ICU TEAM Progress Note  [name] OAC:[id number] DOB: 04/18/1941 DOA: 04/01/2014 PCP: [name], MD  Admit HPI / Brief Narrative: 73 year old WM PMHx Genital Herpes, Hx appendicitis S./P. Appendectomy July 2014, lumbar degenerative disease s/p lumbar surgery  X 3 Last surgery 2009.  Presented with nausea/vomiting/diarrhea, back pain, and A. Fib with RVR. Pt had progressive nausea, vomiting and diarrhea over 2-3 days. Also developed new onset back pain. Was at home and slid down the bed. Was unable to get up from the floor for several hours because of weakness.  In the ER patient noted to be in A. Fib with heart rate into the 140s. Blood pressure in the 100s to 130s. Satting greater than 92% on room air. Notable labs include a white blood cell count 16.6, hemoglobin 19.2, platelets 96, sodium 131, creatinine 1.0, BUN 31, AST 254, ALT 89. Lactate 2.45. CK 12k. Trop 0.09, then <0.3. UA mildly indicative of infection. L. Spine x-ray noted general lumbar degeneration. Chest x-ray within normal limits.   HPI/Subjective: 9/27 A./O. x4, states having increased SOB especially with food/oral medication. Back pain tolerable    Assessment/Plan: Sepsis due to Goup B Strep - 2/2 blood cx - source unclear  -Urine cx not convincing - UA equivocal -9/22 spoke with Dr. Logan Bores (radiology), patient will require neurosurgery consult. Secondary to findings in the L-spine; see MRI results below -L2-3 to L5-S1 disc spaces with marrow edema suspicious for discitis/osteomyelitis specifically at L2-3 and L4-5" with no evidence of infection in the thoracic spine  - TEE 9/23 notes no vegetations  - plan is to complete 6 weeks of IV Rocephin (will need PICC when cleared for same per ID)  - f/u imaging should be accomplished at that time - in absence of abscess, surgical tx would not likely be helpful at this time (spoke w/ NS on call who agreed that IV abx for full course of 6-8  weeks w/ f/u imaging would be c/w his recommendations)  -Leukocytosis has almost normalized. -Will obtain blood cultures in the a.m. and if clear patient will require PICC line placement  Back pain -See sepsis -Continue  Robaxin 500 mg TID -Continue oxycodone 10 mg BID -Patient was able to sit in chair for several hours yesterday without an extraordinary amount of pain. Will Will attempt to have patient ambulate to chair and stay up as long as tolerated.  Lactic acidosis  -Resolved   HTN -Not within AHA guidelines - continue amiodarone 400 mgBID -Cardizem 360 mg daily -Start hydralazine 25 mgQID -Losartan 50 mg daily  Newly diagnosed Afib w/ RVR  -Currently in NSR  -Neurosurgery not going to perform procedure continue Eliquis    Rhabdomyolysis  -Ck peak 12k ,resolved   Guaiac positive  -Hgb stable / not anemic   Transaminitis  -likely shock liver, resolved    Osteomyelitis/phlegmon (Back pain)  -L-spine MRI shows, extensive infection; see MRI results below. -9/23 verbal consult with neurosurgery; recommended no surgical intervention at this time. Concurs with infectious disease recommendation of 6-8 weeks of IV antibiotic  Abdom pain - N/V - diarrhea  -resolved   Thrombocytopenia  -Resolved    Morbid obesity - Body mass index is 30.18 kg/(m^2).  Constipation - continue Bisacodyl 5 mg TID    Code Status: FULL  Family Communication: no family present at time of exam  Disposition Plan: SDU    Consultants: Dr. Peter Martinique (Cardiology)  Dr. Bobby Rumpf (infectious disease)  Dr. Roland Rack (Neurology)     Procedure/Significant Events: 9/22 T-spine/L-spine MRI - Prominent fluid signal L2-3 to L5-S1 disc spaces with marrow edema suspicious for discitis/osteomyelitis specifically at L2-3 and L4-5.  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    - Facet joint fluid at L1-2, L2-3, and L3-4 chronic facet arthritis vs septic arthritis particularly at L2-3.  9/23 TEE;- LVEF=55% to 60%.  - Left atrium: No evidence of thrombus in the atrial cavity or appendage. - Right atrium: No evidence of thrombus in the atrial cavity or appendage.     Culture 9/17 blood right hand x2 positive GROUP B STREP(S.AGALACTIAE) 9/17 stool negative 9/17 stool negative O&P 9/17 C. difficile by PCR negative 9/17 urine negative 9/21 blood pending   Antibiotics: Zosyn 9/17 > 9/20 ; restarted 9/22>> stopped 9/23 Vancomycin 9/22>> stopped 9/23 Rocephin 9/20 > stopped 9/22; restarted 9/24>> stop date 6-8 weeks   DVT prophylaxis: Eliquis   Devices NA   LINES / TUBES:  9/24 22ga right hand    Continuous Infusions: . sodium chloride 50 mL/hr at 04/10/14 1300    Objective: VITAL SIGNS: Temp: 98.6 F (37 C) (09/27 0915) Temp src: Oral (09/27 0915) BP: 156/62 mmHg (09/27 0915) Pulse Rate: 84 (09/27 0915) SPO2; FIO2:   Intake/Output Summary (Last 24 hours) at 04/11/14 1044 Last data filed at 04/11/14 0900  Gross per 24 hour  Intake 1715.83 ml  Output   1250 ml  Net 465.83 ml     Exam: General: A./O. x4, continued L-spine back pain, No acute respiratory distress Lungs: Clear to auscultation bilaterally without wheezes or crackles Cardiovascular: Regular rate and rhythm without murmur gallop or rub normal S1 and S2 Abdomen: Nontender, nondistended, soft, bowel sounds positive, no rebound, no ascites, no appreciable mass,  Back; mild pain to palpation/bilateral straight leg raise left CVA area, right paraspinal and proximal muscle spasms; left paraspinal muscle spasm (muscle spasms significantly decreased)  Extremities: No significant cyanosis, clubbing, or edema bilateral lower extremities Neurologic; upper extremity strength 5/5 bilateral, bilateral lower extremity strength 4/5 (secondary to pain), Sensation intact throughout, did  not ambulate patient secondary to lower extremity weakness/L-spine pain.  Data Reviewed: Basic Metabolic Panel:  Recent Labs Lab 04/05/14 0417 04/06/14 0302 04/07/14 0258 04/08/14 0426 04/11/14 0450  NA 136* 140 140 141 142  K 3.6* 3.7 4.0 3.9 3.7  CL 101 105 104 102 101  CO2 25 25 28 30 30   GLUCOSE 128* 121* 111* 100* 105*  BUN 21 20 19 17 14   CREATININE 0.64 0.64 0.65 0.64 0.58  CALCIUM 7.7* 7.8* 8.3* 8.3* 8.5  MG  --   --  2.2  --  2.0   Liver Function Tests:  Recent Labs Lab 04/06/14 0302 04/07/14 0258 04/11/14 0450  AST 41* 40* 27  ALT 42 39 26  ALKPHOS 96 117 101  BILITOT 0.8 0.9 0.6  PROT 5.6* 5.7* 6.2  ALBUMIN 2.2* 2.3* 2.4*   No results found for this basename: LIPASE, AMYLASE,  in the last 168 hours No results found for this basename: AMMONIA,  in the last 168 hours CBC:  Recent Labs Lab 04/05/14 0417 04/06/14 0302 04/07/14 0258 04/08/14 0426 04/11/14 0450  WBC 20.8* 17.4* 15.7* 13.7* 12.2*  NEUTROABS  --   --  13.6*  --  10.3*  HGB 16.9 16.2 16.1 16.2 16.1  HCT 47.4 46.0 46.6 46.9 47.6  MCV 92.0 93.5  91.9 94.2 96.9  PLT 199 260 339 347 384   Cardiac Enzymes: No results found for this basename: CKTOTAL, CKMB, CKMBINDEX, TROPONINI,  in the last 168 hours BNP (last 3 results)  Recent Labs  04/01/14 2018  PROBNP 2858.0*   CBG: No results found for this basename: GLUCAP,  in the last 168 hours  Recent Results (from the past 240 hour(s))  URINE CULTURE     Status: None   Collection Time    04/01/14  4:47 PM      Result Value Ref Range Status   Specimen Description URINE, CATHETERIZED   Final   Special Requests ADDED [id number]   Final   Culture  Setup Time     Final   Value: 04/02/2014 05:58     Performed at [facility]     Final   Value: 1,000 COLONIES/ML     Performed at [facility]   Culture     Final   Value: INSIGNIFICANT GROWTH     Performed at [facility]   Report Status 04/03/2014  FINAL   Final  STOOL CULTURE     Status: None   Collection Time    04/01/14  5:17 PM      Result Value Ref Range Status   Specimen Description STOOL   Final   Special Requests NONE   Final   Culture     Final   Value: NO SALMONELLA, SHIGELLA, CAMPYLOBACTER, YERSINIA, OR E.COLI 0157:H7 ISOLATED     Performed at [facility]   Report Status 04/05/2014 FINAL   Final  CLOSTRIDIUM DIFFICILE BY PCR     Status: None   Collection Time    04/01/14  5:17 PM      Result Value Ref Range Status   C difficile by pcr NEGATIVE  NEGATIVE Final  CULTURE, BLOOD (ROUTINE X 2)     Status: None   Collection Time    04/01/14  8:10 PM      Result Value Ref Range Status   Specimen Description BLOOD HAND RIGHT   Final   Special Requests BOTTLES DRAWN AEROBIC AND ANAEROBIC 10CC   Final   Culture  Setup Time     Final   Value: 04/02/2014 00:57     Performed at [facility]   Culture     Final   Value: GROUP B STREP(S.AGALACTIAE)ISOLATED     Note: Gram Stain Report Called to,Read Back By and Verified With: BERNADETTE RONCALLO 04/02/14 1400 BY SMITHERSJ     Performed at [facility]   Report Status 04/04/2014 FINAL   Final   Organism ID, Bacteria GROUP B STREP(S.AGALACTIAE)ISOLATED   Final  CULTURE, BLOOD (ROUTINE X 2)     Status: None   Collection Time    04/01/14  8:18 PM      Result Value Ref Range Status   Specimen Description BLOOD HAND RIGHT   Final   Special Requests BOTTLES DRAWN AEROBIC AND ANAEROBIC 5CC   Final   Culture  Setup Time     Final   Value: 04/02/2014 00:56     Performed at [facility]   Culture     Final   Value: GROUP B STREP(S.AGALACTIAE)ISOLATED     Note: SUSCEPTIBILITIES PERFORMED ON PREVIOUS CULTURE WITHIN THE LAST 5 DAYS.     Note: Gram Stain Report Called to,Read Back By and Verified With: BERNADETTE RONCALLO 04/02/14 1400 BY SMITHERSJ  Performed at [facility]   Report Status 04/04/2014 FINAL   Final  MRSA PCR SCREENING      Status: None   Collection Time    04/01/14  9:42 PM      Result Value Ref Range Status   MRSA by PCR NEGATIVE  NEGATIVE Final   Comment:            The GeneXpert MRSA Assay (FDA     approved for NASAL specimens     only), is one component of a     comprehensive MRSA colonization     surveillance program. It is not     intended to diagnose MRSA     infection nor to guide or     monitor treatment for     MRSA infections.  OVA AND PARASITE EXAMINATION     Status: None   Collection Time    04/01/14 11:50 PM      Result Value Ref Range Status   Specimen Description STOOL   Final   Special Requests NONE   Final   Ova and parasites     Final   Value: NO OVA OR PARASITES SEEN     Performed at [facility]   Report Status 04/06/2014 FINAL   Final  CULTURE, BLOOD (ROUTINE X 2)     Status: None   Collection Time    04/05/14  7:07 PM      Result Value Ref Range Status   Specimen Description BLOOD LEFT ARM   Final   Special Requests BOTTLES DRAWN AEROBIC AND ANAEROBIC 8CC   Final   Culture  Setup Time     Final   Value: 04/06/2014 01:37     Performed at [facility]   Culture     Final   Value:        BLOOD CULTURE RECEIVED NO GROWTH TO DATE CULTURE WILL BE HELD FOR 5 DAYS BEFORE ISSUING A FINAL NEGATIVE REPORT     Performed at [facility]   Report Status PENDING   Incomplete     Studies:  Recent x-ray studies have been reviewed in detail by the Attending Physician  Scheduled Meds:  Scheduled Meds: . amiodarone  400 mg Oral BID  . antiseptic oral rinse  7 mL Mouth Rinse BID  . apixaban  5 mg Oral BID  . bisacodyl  5 mg Oral TID  . cefTRIAXone (ROCEPHIN)  IV  2 g Intravenous Q24H  . diltiazem  360 mg Oral Daily  . losartan  50 mg Oral Daily  . methocarbamol  500 mg Oral TID  . OxyCODONE  10 mg Oral Q12H  . senna-docusate  1 tablet Oral BID  . sodium chloride  3 mL Intravenous Q12H    Time spent on care of this patient: 40 mins   Allie Bossier , MD   [facility] Office  [phone] Pager - [phone]  On-Call/Text Page:      [email or web address]      password [email or web address]  If 7PM-7AM, please contact night-coverage [email or web address] Password [email or web address] 04/11/2014, 10:44 AM   LOS: 10 days

## 2014-04-12 LAB — CULTURE, BLOOD (ROUTINE X 2): Culture: NO GROWTH

## 2014-04-12 MED ORDER — SODIUM CHLORIDE 0.9 % IJ SOLN
10.0000 mL | Freq: Two times a day (BID) | INTRAMUSCULAR | Status: DC
  Administered 2014-04-13: 10 mL

## 2014-04-12 MED ORDER — SODIUM CHLORIDE 0.9 % IJ SOLN
10.0000 mL | INTRAMUSCULAR | Status: DC | PRN
  Administered 2014-04-13 – 2014-04-15 (×4): 10 mL

## 2014-04-12 MED ORDER — AMIODARONE HCL 200 MG PO TABS
200.0000 mg | ORAL_TABLET | Freq: Two times a day (BID) | ORAL | Status: DC
  Administered 2014-04-13 – 2014-04-15 (×5): 200 mg via ORAL
  Filled 2014-04-12 (×6): qty 1

## 2014-04-12 MED ORDER — POLYETHYLENE GLYCOL 3350 17 G PO PACK
17.0000 g | PACK | Freq: Two times a day (BID) | ORAL | Status: DC
  Administered 2014-04-12 – 2014-04-13 (×2): 17 g via ORAL
  Filled 2014-04-12 (×7): qty 1

## 2014-04-12 MED ORDER — MAGNESIUM CITRATE PO SOLN: 0.5000 | Freq: Once | ORAL | Status: AC | PRN

## 2014-04-12 NOTE — Progress Notes (Signed)
Peripherally Inserted Central Catheter/Midline Placement  The IV Nurse has discussed with the patient and/or persons authorized to consent for the patient, the purpose of this procedure and the potential benefits and risks involved with this procedure.  The benefits include less needle sticks, lab draws from the catheter and patient may be discharged home with the catheter.  Risks include, but not limited to, infection, bleeding, blood clot (thrombus formation), and puncture of an artery; nerve damage and irregular heat beat.  Alternatives to this procedure were also discussed.  PICC/Midline Placement Documentation  PICC / Midline Single Lumen 59/93/57 PICC Right Basilic 38 cm 0 cm (Active)  Indication for Insertion or Continuance of Line Home intravenous therapies (PICC only) 04/12/2014  6:55 PM  Exposed Catheter (cm) 0 cm 04/12/2014  6:55 PM  Site Assessment Clean;Dry;Intact 04/12/2014  6:55 PM  Line Status Flushed;Saline locked;Blood return noted 04/12/2014  6:55 PM  Dressing Type Transparent 04/12/2014  6:55 PM  Dressing Status Clean;Dry;Intact;Antimicrobial disc in place 04/12/2014  6:55 PM  Dressing Change Due 04/19/14 04/12/2014  6:55 PM       Rox Mcgriff, Nicolette Bang 04/12/2014, 6:55 PM

## 2014-04-12 NOTE — Clinical Social Work Psychosocial (Signed)
Clinical Social Work Department BRIEF PSYCHOSOCIAL ASSESSMENT 04/12/2014  Patient:  [name], [name]     Account Number:  [id number]     Admit date:  04/01/2014  Clinical Social Worker:  Domenica Reamer, [place]  Date/Time:  04/12/2014 04:29 PM  Referred by:  Physician  Date Referred:  04/12/2014 Referred for  SNF Placement   Other Referral:   Interview type:  Patient Other interview type:    PSYCHOSOCIAL DATA Living Status:  ALONE Admitted from facility:   Level of care:   Primary support name:  Pollyann Kennedy Primary support relationship to patient:  FAMILY Degree of support available:   Patient reported high level of support from long tim friends and a few cousins that still live in the area.    CURRENT CONCERNS Current Concerns  Post-Acute Placement   Other Concerns:    SOCIAL WORK ASSESSMENT / PLAN CSW spoke with patient about SNF placement.  Patient is agreeable to SNF bed search in [facility] but is not sure if he is willing to commit to going.  Patient reported that he has lived in [place] since 1968.Patient stated that he has never been to a SNF before. CSW will continue to follow   Assessment/plan status:  Psychosocial Support/Ongoing Assessment of Needs Other assessment/ plan:   FL2  PASAR   Information/referral to community resources:   [facility]    PATIENT'S/FAMILY'S RESPONSE TO PLAN OF CARE: Patient is agreeable to SNF and stated that he is open minded about what the doctors think is best for him.       Domenica Reamer, [place] Social Worker [phone]

## 2014-04-12 NOTE — Progress Notes (Signed)
CARE MANAGEMENT NOTE 04/12/2014  Patient:  [name], [name]   Account Number:  [id number]  Date Initiated:  04/06/2014  Documentation initiated by:  MAYO,HENRIETTA  Subjective/Objective Assessment:   dx sepsis; lives alone    PCP  [name]     Action/Plan:   Anticipated DC Date:     Anticipated DC Plan:  SKILLED NURSING FACILITY  In-house referral  Clinical Social Worker      DC Planning Services  CM consult      Choice offered to / List presented to:             Status of service:  In process, will continue to follow Medicare Important Message given?  YES (If response is "NO", the following Medicare IM given date fields will be blank) Date Medicare IM given:  04/06/2014 Medicare IM given by:  MAYO,HENRIETTA Date Additional Medicare IM given:  04/09/2014 Additional Medicare IM given by:  [facility]  Discharge Disposition:    Per UR Regulation:  Reviewed for med. necessity/level of care/duration of stay  If discussed at [place] of Stay Meetings, dates discussed:    Comments:  04/12/2014 1230 per benefits check with [employer], Eliquis: $170.62 for 30 day at retail/ no auth required. Pt will receive 30 day free trial of medication. Contacted CSW for SNF placement.  Jonnie Finner RN CCM Case Mgmt phone [phone]  04/12/2014 1200 Pt states he lives at home alone. NCM discussed SNF with pt and he agreeable to placement for long term IV abx treatment. Pt states he does not have adequate assistance at home to administer medication. Jonnie Finner RN CCM Case Mgmt phone [phone]

## 2014-04-12 NOTE — Progress Notes (Signed)
[place] TEAM 1 - Stepdown/ICU TEAM Progress Note  [name] YNW:[id number] DOB: 10/17/1940 DOA: 04/01/2014 PCP: [name], MD  Admit HPI / Brief Narrative: 72 year old male with a history of appendicitis status post appendectomy July 2014, lumbar degenerative disease s/p lumbar surgery several years ago who presented with nausea/vomiting/diarrhea, back pain, and A Fib with RVR. Pt had progressive nausea, vomiting and diarrhea over 2-3 days. Also developed new onset back pain. Was at home and slid down the bed. Was unable to get up from the floor for several hours because of weakness.   In the ER patient noted to be in A. Fib with heart rate into the 140s. Blood pressure in the 100s to 130s. Satting greater than 92% on room air. Notable labs include a white blood cell count 16.6, hemoglobin 19.2, platelets 96, sodium 131, creatinine 1.0, BUN 31, AST 254, ALT 89. Lactate 2.45. CK 12k. Trop 0.09, then <0.3. UA mildly indicative of infection. L. Spine x-ray noted general lumbar degeneration. Chest x-ray within normal limits.  After admit, the pt was discovered to be suffering with a Group B Strep bacteremia.  The source was not clear, though urine studies were suspicious for a UTI, though not confirmatory.  C/O back pain led to MRI imaging which revelaed a discitits/osteo in the lumbo-sacral region, but no defined abscess.  ID was consulted.  A TEE was negative for endocarditis.  His stay was complicated by the acute onset of Afib w/ RVR, for which Cardiology was consulted.  With improvement in his overall condition, and admin of amio (IV > PO) the pt has converted to NSR.    The plan is to complete 6 full weeks of abx tx, followed by repeat lumbosacral MRI.  This can be accomplished via the ID Clinic per Dr. Johnnye Sima.    HPI/Subjective: Pt is feeling much better in general.  He c/o constipation and weakness.  At present he is not able to do much more than transfer from bed to chair.     Assessment/Plan:   Sepsis due to Goup B Strep - 2/2 blood cx - source unclear w/ Lumbar diskitis/phlegmon Urine cx not convincing - UA equivocal - sepsis physiology resolved - cont Rocephin - ID following w/ [place] - MRI of spine noted "prominent fluid signal in the the L2-3 to L5-S1 disc spaces with marrow edema suspicious for discitis/osteomyelitis specifically at L2-3 and L4-5" with no evidence of infection in the thoracic spine - TEE 9/23 noted no vegetations - plan is to complete 6 weeks of IV Rocephin - f/u imaging should be accomplished at that time - ID Dr. Johnnye Sima has agreed to see pt in ID Clinic in outpt f/u - spoke w/ NS on call who agreed that IV abx for full course of 6-8 weeks w/ f/u imaging would be c/w his recommendations - repeat blood cx from 9/21 no growth so will place PICC in preparation for prolonged tx course  Lactic acidosis  Resolved w/ volume expansion  Newly diagnosed Afib w/ RVR  Per Cardiology - meds converted to oral and pt tolerating well, with preserved NSR - heparin > eliquis - plan is for amio at 400mg  BID for 1 week, then 200mg  BID afterward  Rhabdomyolysis  Ck peak 12k - resolved   Guaiac positive  In setting of diarrhea, significance of this is not clear - Hgb stable / not anemic - will need outpt screening colonoscopy when more stable   Transaminitis likely shock liver -  improving w/ hydration to point of essential resolution   Abdom pain - N/V - diarrhea  resolved  Thrombocytopenia Resolved   Morbid obesity - Body mass index is 31.95 kg/(m^2).  Deconditioning  May require CIR or SNF rehab stay - pt is determined to d/c home - begin PT/OT - transfer to tele bed  Code Status: FULL Family Communication: no family present at time of exam Disposition Plan: transfer to med bed - begin PT/OT - place PICC  Consultants: Cardiology Neurology  ID  Antibiotics: Zosyn 9/17 > 9/20 Rocephin 9/20 >  DVT prophylaxis: eliquis  Objective: Blood  pressure 141/65, pulse 84, temperature 98 F (36.7 C), temperature source Oral, resp. rate 24, height 5\' 10"  (1.778 m), weight 101 kg (222 lb 10.6 oz), SpO2 93.00%.  Intake/Output Summary (Last 24 hours) at 04/12/14 1331 Last data filed at 04/12/14 0700  Gross per 24 hour  Intake 844.17 ml  Output   1275 ml  Net -430.83 ml   Exam: General: No acute respiratory distress - alert and oriented  Lungs: Clear to auscultation bilaterally without wheezes or crackles Cardiovascular: RRR w/o gallup or rub - no appreciable M  Abdomen: Nontender, protuberent, soft, bowel sounds positive, no rebound, no ascites, no appreciable mass Extremities: No significant cyanosis, clubbing, edema bilateral lower extremities  Data Reviewed: Basic Metabolic Panel:  Recent Labs Lab 04/06/14 0302 04/07/14 0258 04/08/14 0426 04/11/14 0450  NA 140 140 141 142  K 3.7 4.0 3.9 3.7  CL 105 104 102 101  CO2 25 28 30 30   GLUCOSE 121* 111* 100* 105*  BUN 20 19 17 14   CREATININE 0.64 0.65 0.64 0.58  CALCIUM 7.8* 8.3* 8.3* 8.5  MG  --  2.2  --  2.0   Liver Function Tests:  Recent Labs Lab 04/06/14 0302 04/07/14 0258 04/11/14 0450  AST 41* 40* 27  ALT 42 39 26  ALKPHOS 96 117 101  BILITOT 0.8 0.9 0.6  PROT 5.6* 5.7* 6.2  ALBUMIN 2.2* 2.3* 2.4*   Coags:  Recent Labs Lab 04/07/14 1240  INR 1.26   CBC:  Recent Labs Lab 04/06/14 0302 04/07/14 0258 04/08/14 0426 04/11/14 0450  WBC 17.4* 15.7* 13.7* 12.2*  NEUTROABS  --  13.6*  --  10.3*  HGB 16.2 16.1 16.2 16.1  HCT 46.0 46.6 46.9 47.6  MCV 93.5 91.9 94.2 96.9  PLT 260 339 347 384    Recent Results (from the past 240 hour(s))  CULTURE, BLOOD (ROUTINE X 2)     Status: None   Collection Time    04/05/14  7:07 PM      Result Value Ref Range Status   Specimen Description BLOOD LEFT ARM   Final   Special Requests BOTTLES DRAWN AEROBIC AND ANAEROBIC 8CC   Final   Culture  Setup Time     Final   Value: 04/06/2014 01:37     Performed at  [facility]   Culture     Final   Value: NO GROWTH 5 DAYS     Performed at [facility]   Report Status 04/12/2014 FINAL   Final     Studies:  Recent x-ray studies have been reviewed in detail by the Attending Physician  Scheduled Meds:  Scheduled Meds: . amiodarone  400 mg Oral BID  . antiseptic oral rinse  7 mL Mouth Rinse BID  . apixaban  5 mg Oral BID  . bisacodyl  5 mg Oral TID  . cefTRIAXone (ROCEPHIN)  IV  2 g Intravenous Q24H  . diltiazem  360 mg Oral Daily  . hydrALAZINE  25 mg Oral 4 times per day  . losartan  50 mg Oral Daily  . methocarbamol  500 mg Oral TID  . OxyCODONE  10 mg Oral Q12H  . senna-docusate  1 tablet Oral BID  . sodium chloride  3 mL Intravenous Q12H    Time spent on care of this patient: 35 mins   MCCLUNG,JEFFREY T , MD   [facility] Office  [phone] Pager - Text Page per Shea Evans as per below:  On-Call/Text Page:      [email or web address]      password [email or web address]  If 7PM-7AM, please contact night-coverage [email or web address] Password [email or web address] 04/12/2014, 1:31 PM   LOS: 11 days

## 2014-04-13 ENCOUNTER — Inpatient Hospital Stay (HOSPITAL_COMMUNITY): Payer: Medicare HMO

## 2014-04-13 DIAGNOSIS — R5381 Other malaise: Secondary | ICD-10-CM

## 2014-04-13 MED ORDER — FUROSEMIDE 10 MG/ML IJ SOLN
20.0000 mg | Freq: Two times a day (BID) | INTRAMUSCULAR | Status: AC
  Administered 2014-04-13 – 2014-04-14 (×2): 20 mg via INTRAVENOUS
  Filled 2014-04-13 (×2): qty 2

## 2014-04-13 NOTE — Evaluation (Addendum)
Physical Therapy Evaluation Patient Details Name: [name] MRN: [id number] DOB: May 27, 1941 Today's Date: 04/13/2014   History of Present Illness  73 y.o. 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 Fib with RVR. Pt states that he was in otherwise normal state of health up until Monday when pt had progressive nausea, vomiting and diarrhea over 2-3 days. Also developed new onset back pain. GI output NBNB. Patient states he progressively got weak over the course of the week. Was at home yesterday and slid down the bed. Was unable to get up from the floor for several hours because of weakness. Back pain has been fairly constant.  Clinical Impression  Pt admitted with dehydration and back pain. After admit, the pt was discovered to be suffering with a Group B Strep bacteremia. Pt currently with functional limitations due to the deficits listed below (see PT Problem List).  Pt will benefit from skilled PT to increase their independence and safety with mobility to allow discharge to the venue listed below. Pt was totally independent prior to admission playing golf 3x/week and riding his motorcycle.  At this time, pt was a +1 with use of Stedy and would recommend +2 for attempts with RW.  If pt is not a candidate for CIR then recommend SNF.     Follow Up Recommendations CIR;SNF    Equipment Recommendations  None recommended by PT    Recommendations for Other Services Rehab consult     Precautions / Restrictions Restrictions Weight Bearing Restrictions: No      Mobility  Bed Mobility Overal bed mobility: Needs Assistance Bed Mobility: Sit to Supine       Sit to supine: Max assist   General bed mobility comments: Pt had difficulty with attempt at sit to  sidelying.  He is very stiff and guarded throughout any movement.  BEd placed in Trendelenberg to scoot to [facility], but able to pull on head board and pull himself up in this position.  Transfers Overall transfer level: Needs assistance   Transfers: Sit to/from Stand Sit to Stand: Mod assist;Min assist         General transfer comment: Pt stood up to [facility] with MIN/MOD A with pulling on front bar.  Transferred from recliner to bed with stedy.  Able to stand ~20 seconds before needing to sit.  Ambulation/Gait             General Gait Details: Unable to ambulate 10/10 back pain  Stairs            Wheelchair Mobility    Modified Rankin (Stroke Patients Only)       Balance Overall balance assessment: Needs assistance   Sitting balance-Leahy Scale: Fair       Standing balance-Leahy Scale: Zero                               Pertinent Vitals/Pain Pain Assessment: 0-10 Pain Score: 10-Worst pain ever Pain Location: low back Pain Descriptors / Indicators: Constant Pain Intervention(s): Limited activity within patient's tolerance;Monitored during session;Repositioned (declined when asked if wanted to ask RN for pain meds)    Home Living Family/patient expects to be discharged to:: Private residence Living Arrangements: Alone   Type of Home: House Home Access: Stairs to enter Entrance Stairs-Rails: Right;Left  Entrance Stairs-Number of Steps: 2 Home Layout: One level Home Equipment: Cane - single point;Bedside commode      Prior Function Level of Independence: Independent         Comments: retired. Plays golf 3 days/week and rides motorcycle.     Hand Dominance        Extremity/Trunk Assessment   Upper Extremity Assessment: Defer to OT evaluation           Lower Extremity Assessment: Generalized weakness         Communication   Communication: No difficulties  Cognition Arousal/Alertness: Awake/alert Behavior During Therapy:  WFL for tasks assessed/performed Overall Cognitive Status: Within Functional Limits for tasks assessed                      General Comments General comments (skin integrity, edema, etc.): Pt with noted swelling in B LE and he reports he is much more swollen overall than normal. Pt with guarded movement with decreased cervical movement.    Exercises Other Exercises Other Exercises: Pt instructed in supine LE ther ex he could do when pain level decreases.      Assessment/Plan    PT Assessment Patient needs continued PT services  PT Diagnosis Acute pain;Difficulty walking   PT Problem List Decreased balance;Decreased mobility;Pain;Decreased strength  PT Treatment Interventions Gait training;Functional mobility training;Therapeutic activities;Therapeutic exercise   PT Goals (Current goals can be found in the Care Plan section) Acute Rehab PT Goals Patient Stated Goal: To get his independence back PT Goal Formulation: With patient Time For Goal Achievement: 04/27/14 Potential to Achieve Goals: Good    Frequency Min 3X/week   Barriers to discharge        Co-evaluation               End of Session Equipment Utilized During Treatment: Gait belt;Other (comment) [name] Activity Tolerance: Patient limited by pain Patient left: in bed;with call bell/phone within reach Nurse Communication: Mobility status;Need for lift equipment         Time: 0918-0950 PT Time Calculation (min): 32 min   Charges:   PT Evaluation $Initial PT Evaluation Tier I: 1 Procedure PT Treatments $Therapeutic Activity: 23-37 mins   PT G Codes:          Faye Sanfilippo LUBECK 04/13/2014, 10:03 AM

## 2014-04-13 NOTE — Clinical Social Work Placement (Signed)
Clinical Social Work Department CLINICAL SOCIAL WORK PLACEMENT NOTE 04/13/2014  Patient:  [name], [name]  Account Number:  [id number] Admit date:  04/01/2014  Clinical Social Worker:  Daiva Huge  Date/time:  04/13/2014 03:52 PM  Clinical Social Work is seeking post-discharge placement for this patient at the following level of care:   SKILLED NURSING   (*CSW will update this form in Epic as items are completed)   04/13/2014  Patient/family provided with [facility] Department of Clinical Social Work's list of facilities offering this level of care within the geographic area requested by the patient (or if unable, by the patient's family).  04/13/2014  Patient/family informed of their freedom to choose among providers that offer the needed level of care, that participate in Medicare, Medicaid or managed care program needed by the patient, have an available bed and are willing to accept the patient.  04/13/2014  Patient/family informed of [facility]' ownership interest in [facility], as well as of the fact that they are under no obligation to receive care at this facility.  PASARR submitted to EDS on 04/13/2014 PASARR number received on 04/13/2014  FL2 transmitted to all facilities in geographic area requested by pt/family on  04/13/2014 FL2 transmitted to all facilities within larger geographic area on   Patient informed that his/her managed care company has contracts with or will negotiate with  certain facilities, including the following:     Patient/family informed of bed offers received:   Patient chooses bed at  Physician recommends and patient chooses bed at    Patient to be transferred to  on   Patient to be transferred to facility by  Patient and family notified of transfer on  Name of family member notified:    The following physician request were entered in Epic:   Additional Comments: [name], MSW, [place]

## 2014-04-13 NOTE — Progress Notes (Signed)
Rehab Admissions Coordinator Note:  Patient was screened by Retta Diones for appropriateness for an Inpatient Acute Rehab Consult.  At this time, an inpatient rehab consult has been ordered and is pending.  Sherlyn Hay [place] will follow up once consult is completed and she can be reached at [phone]  Retta Diones 04/13/2014, 12:21 PM

## 2014-04-13 NOTE — Progress Notes (Signed)
[place] TEAM 1 TRANSFER 9/29  [name] DDU:[id number] DOB: 05/03/1941 DOA: 04/01/2014 PCP: [name], MD  Admit HPI / Brief Narrative: 73 year old male with a history of appendicitis status post appendectomy July 2014, lumbar degenerative disease s/p lumbar surgery several years ago who presented with nausea/vomiting/diarrhea, back pain, and A Fib with RVR. Pt had progressive nausea, vomiting and diarrhea over 2-3 days. Also developed new onset back pain. Was at home and slid down the bed. Was unable to get up from the floor for several hours because of weakness.   In the ER patient noted to be in A. Fib with heart rate into the 140s. Blood pressure in the 100s to 130s. Satting greater than 92% on room air. Notable labs include a white blood cell count 16.6, hemoglobin 19.2, platelets 96, sodium 131, creatinine 1.0, BUN 31, AST 254, ALT 89. Lactate 2.45. CK 12k. Trop 0.09, then <0.3. UA mildly indicative of infection. L. Spine x-ray noted general lumbar degeneration. Chest x-ray within normal limits.  After admit, the pt was discovered to be suffering with a Group B Strep bacteremia.  C/O back pain led to MRI imaging which revelaed a discitits/osteo in the lumbo-sacral region, but no defined abscess.  ID was consulted.  A TEE was negative for endocarditis.  His stay was complicated by the acute onset of Afib w/ RVR, for which Cardiology was consulted.  With improvement in his overall condition, and admin of amio (IV > PO) the pt has converted to NSR.    The plan is to complete 6 full weeks of abx tx, followed by repeat lumbosacral MRI.  This can be accomplished via the ID Clinic per Dr. Johnnye Sima.    HPI/Subjective: Complains of dyspnea, congestion, also complains of constipation, last BM this am, still with moderate to severe low back pain, meds helping    Assessment/Plan:   Sepsis due to Goup B Strep - 2/2 blood cx -  w/ Lumbar diskitis/osteomyelitis - sepsis physiology  resolved -followed by ID, recommended 6 weeks of IV Rocephin start date 9/21 -MRI of spine noted "prominent fluid signal in the the L2-3 to L5-S1 disc spaces with marrow edema suspicious for discitis/osteomyelitis specifically at L2-3 and L4-5" with no evidence of infection in the thoracic spine  - TEE 9/23 noted no vegetations  - ID Dr. Johnnye Sima has agreed to see pt in ID Clinic in outpt f/u  -per Dr.McClung notes on 9/23, he spoke w/ Neurosurgery on call who recommended IV abx for full course of 6-8 weeks w/ f/u imaging - repeat blood cx from 9/21 no growth  - PICC line placed  Lactic acidosis  Resolved w/ volume expansion  Newly diagnosed Afib w/ RVR  Per Cardiology - meds converted to oral and pt tolerating well,  -remains in NSR  -was on IV hepari, transitioned to PO eliquis  - plan is for amio at 400mg  BID for 1 week, then 200mg  BID afterward  Rhabdomyolysis  Ck peak 12k - resolved   Guaiac positive  In setting of diarrhea, significance of this is not clear - Hgb stable / not anemic - will need outpt screening colonoscopy when more stable   Transaminitis likely shock liver - improving w/ hydration to point of essential resolution   Abdom pain - N/V - diarrhea  resolved  Thrombocytopenia Resolved   Morbid obesity - Body mass index is 31.95 kg/(m^2).  Dyspnea/cough, congestion -check CXR, suspect may be from volume overload (iatrogenic), weight up 6kg  since admission -IV lasix x2 doses, reassess in am -ECHO with preserved EF  Deconditioning  -May require CIR or SNF rehab stay - pt is determined to d/c home - begin PT/OT -CIR consult  Code Status: FULL Family Communication: no family present at time of exam Disposition Plan: ambulate, SNF vs CIR  Consultants: Cardiology Neurology  ID  Antibiotics: Zosyn 9/17 > 9/20 Rocephin 9/20 >  DVT prophylaxis: eliquis  Objective: Blood pressure 151/58, pulse 85, temperature 98.3 F (36.8 C), temperature source Oral,  resp. rate 18, height 5\' 10"  (1.778 m), weight 101 kg (222 lb 10.6 oz), SpO2 97.00%.  Intake/Output Summary (Last 24 hours) at 04/13/14 1017 Last data filed at 04/13/14 0647  Gross per 24 hour  Intake    480 ml  Output   1226 ml  Net   -746 ml   Exam: General: No acute respiratory distress - alert and oriented  Lungs: poor air movt bilaterally without wheezes or crackles Cardiovascular: RRR w/o gallup or rub - no appreciable M  Abdomen: Nontender, protuberent, soft, bowel sounds positive, no rebound, no ascites, no appreciable mass Extremities: No significant cyanosis, clubbing, 2 plus edema bilateral lower extremities  Data Reviewed: Basic Metabolic Panel:  Recent Labs Lab 04/07/14 0258 04/08/14 0426 04/11/14 0450  NA 140 141 142  K 4.0 3.9 3.7  CL 104 102 101  CO2 28 30 30   GLUCOSE 111* 100* 105*  BUN 19 17 14   CREATININE 0.65 0.64 0.58  CALCIUM 8.3* 8.3* 8.5  MG 2.2  --  2.0   Liver Function Tests:  Recent Labs Lab 04/07/14 0258 04/11/14 0450  AST 40* 27  ALT 39 26  ALKPHOS 117 101  BILITOT 0.9 0.6  PROT 5.7* 6.2  ALBUMIN 2.3* 2.4*   Coags:  Recent Labs Lab 04/07/14 1240  INR 1.26   CBC:  Recent Labs Lab 04/07/14 0258 04/08/14 0426 04/11/14 0450  WBC 15.7* 13.7* 12.2*  NEUTROABS 13.6*  --  10.3*  HGB 16.1 16.2 16.1  HCT 46.6 46.9 47.6  MCV 91.9 94.2 96.9  PLT 339 347 384    Recent Results (from the past 240 hour(s))  CULTURE, BLOOD (ROUTINE X 2)     Status: None   Collection Time    04/05/14  7:07 PM      Result Value Ref Range Status   Specimen Description BLOOD LEFT ARM   Final   Special Requests BOTTLES DRAWN AEROBIC AND ANAEROBIC 8CC   Final   Culture  Setup Time     Final   Value: 04/06/2014 01:37     Performed at [facility]   Culture     Final   Value: NO GROWTH 5 DAYS     Performed at [facility]   Report Status 04/12/2014 FINAL   Final     Studies:  Recent x-ray studies have been reviewed in detail by  the Attending Physician  Scheduled Meds:  Scheduled Meds: . amiodarone  200 mg Oral BID  . antiseptic oral rinse  7 mL Mouth Rinse BID  . apixaban  5 mg Oral BID  . bisacodyl  5 mg Oral TID  . cefTRIAXone (ROCEPHIN)  IV  2 g Intravenous Q24H  . diltiazem  360 mg Oral Daily  . hydrALAZINE  25 mg Oral 4 times per day  . losartan  50 mg Oral Daily  . methocarbamol  500 mg Oral TID  . OxyCODONE  10 mg Oral Q12H  . polyethylene  glycol  17 g Oral BID  . senna-docusate  1 tablet Oral BID  . sodium chloride  10-40 mL Intracatheter Q12H  . sodium chloride  3 mL Intravenous Q12H    Time spent on care of this patient: 64 mins   Domenic Polite , MD  [phone]  [facility] Office  [phone] Pager - Text Page per Shea Evans as per below:  On-Call/Text Page:      [email or web address]      password [email or web address]  If 7PM-7AM, please contact night-coverage [email or web address] Password [email or web address] 04/13/2014, 10:17 AM   LOS: 12 days

## 2014-04-13 NOTE — Consult Note (Signed)
Physical Medicine and Rehabilitation Consult  Reason for Consult: Sepsis with Lumbar diskitis/osteomyelits Referring Physician: Dr. Broadus John.    HPI: [name] is a 73 y.o. male with history of DJD, morbid obesity who was admitted on 04/02/14 with one week history of nausea, vomiting and diarrhea with fall and inability to get up. He laid on the floor for approximately 20 hours and was found by friend who helped him get up and presented to ED for work up. He was found to have A fib with RVR, dehydration, rhabdomyolysis as well as leucocytosis due to sepsis. He was started on IV Cardizem and 2 D echo limited due to tachycardia and body habitus. Cardiology consulted for input and felt that A fib likely multifactorial due to current medical issues,  Question of undiagnosed OSA as well as excessive caffeine use. Patient converted to NSR on amiodarone. Blood cultures positive for group B strep and MRI of spine as well as TEE recommended for work up by Dr. Johnnye Sima. Patient with generalized weakness with neck and shoulder pain that was felt to be due to fall with rhabdomyolysis. MRI thoracolumbar spine with facet joint fluid L1-L4 with question of chronic advanced facet arthritis and marrow edema L2/3 and L4/5 suspicious for discitis/osteomyelits. TEE wih Ef 55-60% with no wall abnormality, no evidence of thrombus and no evidence of endocarditis.   Case discussed with NS on call who recommended full course of 6-8 weeks antibiotics with f/u imaging. Follow up BC negative and PICC placed with patient to continue IV rocephin X 6 weeks. PT evaluation done today and patient with limitations in mobility due to back pain. Patient independent PTA and CIR recommended by PT and MD.   ROS   Past Medical History  Diagnosis Date  . Acute appendicitis with peritoneal abscess 01/28/2013  . Ileus, postoperative 01/28/2013  . Tobacco use disorder 01/28/2013  . Obesity, morbid 01/28/2013  . Shortness of breath      Past Surgical History  Procedure Laterality Date  . Joint replacement      bilateral knee surgery  . Back surgery    . Laparoscopic appendectomy N/A 01/25/2013    Procedure: APPENDECTOMY LAPAROSCOPIC;  Surgeon: Ralene Ok, MD;  Location: [facility];  Service: General;  Laterality: N/A;  . Tee without cardioversion N/A 04/07/2014    Procedure: TRANSESOPHAGEAL ECHOCARDIOGRAM (TEE);  Surgeon: Candee Furbish, MD;  Location[facility];  Service: Cardiovascular;  Laterality: N/A;   History reviewed. No pertinent family history.  Social History:  Lives alone. Independent PTA.   reports that he has been smoking Cigarettes.  He has a 75 pack-year smoking history. He does not have any smokeless tobacco history on file. He reports that he does not drink alcohol or use illicit drugs.  Allergies: No Known Allergies  Medications Prior to Admission  Medication Sig Dispense Refill  . aspirin EC 81 MG tablet Take 81 mg by mouth daily.        Home: Home Living Family/patient expects to be discharged to:: Private residence Living Arrangements: Alone Type of Home: House Home Access: Stairs to enter [employer] of Steps: 2 Entrance Stairs-Rails: Right;Left Home Layout: One level [place] - single point;Bedside commode  Functional History: Prior Function Level of Independence: Independent Comments: retired. Plays golf 3 days/week and rides motorcycle. Functional Status:  Mobility: Bed Mobility Overal bed mobility: Needs Assistance Bed Mobility: Sit to Supine Sit to supine: Max assist General bed mobility comments: Pt had difficulty with  attempt at sit to sidelying.  He is very stiff and guarded throughout any movement.  BEd placed in Trendelenberg to scoot to [facility], but able to pull on head board and pull himself up in this position. Transfers Overall transfer level: Needs assistance Transfers: Sit to/from Stand Sit to Stand: Mod assist;Min assist General transfer  comment: Pt stood up to [facility] with MIN/MOD A with pulling on front bar.  Transferred from recliner to bed with stedy.  Able to stand ~20 seconds before needing to sit. Ambulation/Gait General Gait Details: Unable to ambulate 10/10 back pain    ADL:    Cognition: Cognition Overall Cognitive Status: Within Functional Limits for tasks assessed Orientation Level: Oriented X4 Cognition Arousal/Alertness: Awake/alert Behavior During Therapy: WFL for tasks assessed/performed Overall Cognitive Status: Within Functional Limits for tasks assessed  Blood pressure 151/58, pulse 85, temperature 98.3 F (36.8 C), temperature source Oral, resp. rate 18, height 5\' 10"  (1.778 m), weight 101 kg (222 lb 10.6 oz), SpO2 97.00%. Physical Exam  Nursing note and vitals reviewed. Constitutional: He is oriented to person, place, and time. He appears well-developed and well-nourished. Nasal cannula in place.  HENT:  Head: Normocephalic and atraumatic.  Eyes: Conjunctivae are normal. Pupils are equal, round, and reactive to light.  Neck: Normal range of motion. Neck supple.  Cardiovascular: Normal rate and regular rhythm.   Respiratory: Effort normal. No respiratory distress. He has wheezes.  Congested upper airway sounds with SOB with activity  GI: Soft. Bowel sounds are normal. He exhibits distension. There is no tenderness.  Musculoskeletal:  2+ edema RLE>LLE. Dry scabs bilateral knees.   Neurological: He is alert and oriented to person, place, and time. No cranial nerve deficit. Coordination normal.  Follows commands without difficulty. UES: 4/5 prox to distal. LE: 2/5 HF, 3/5 KE and 4+ feet. No gross sensory deficits.   Skin: Skin is warm and dry.  Psychiatric: He has a normal mood and affect. His behavior is normal.    No results found for this or any previous visit (from the past 24 hour(s)). No results found.  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 Does the need for close, 24 hr/day medical supervision in concert with the patient's rehab needs make it unreasonable for this patient to be served in a less intensive setting? Yes 2. Co-Morbidities requiring supervision/potential complications: pain control, afib, 3. Due to bladder management, bowel management, safety, skin/wound care, disease management, medication administration, pain management and patient education, does the patient require 24 hr/day rehab nursing? Yes 4. Does the patient require coordinated care of a physician, rehab nurse, PT (1-2 hrs/day, 5 days/week) and OT (1-2 hrs/day, 5 days/week) to address physical and functional deficits in the context of the above medical diagnosis(es)? Yes Addressing deficits in the following areas: balance, endurance, locomotion, strength, transferring, bowel/bladder control, bathing, dressing, feeding, grooming, toileting and psychosocial support 5. Can the patient actively participate in an intensive therapy program of at least 3 hrs of therapy per day at least 5 days per week? Yes 6. The potential for patient to make measurable gains while on inpatient rehab is excellent 7. Anticipated functional outcomes upon discharge from inpatient rehab are mod I to supervision with PT, supervision to mod I with OT, n/a with SLP. 8. Estimated rehab length of stay to reach the above functional goals is: 10-15 days 9. Does the patient have adequate social supports to accommodate these discharge functional goals? Yes 10. Anticipated D/C setting: Home 11. Anticipated  post D/C treatments: HH therapy and Outpatient therapy 12. Overall Rehab/Functional Prognosis: excellent  RECOMMENDATIONS: This patient's condition is appropriate for continued rehabilitative care in the following setting: CIR Patient has agreed to participate in recommended program. Yes Note that insurance prior authorization may be required for reimbursement for recommended  care.  Comment: Rehab Admissions Coordinator to follow up.  Thanks,  Meredith Staggers, MD, [place]     04/13/2014

## 2014-04-14 LAB — BASIC METABOLIC PANEL
Anion gap: 9 (ref 5–15)
BUN: 17 mg/dL (ref 6–23)
CHLORIDE: 99 meq/L (ref 96–112)
CO2: 32 mEq/L (ref 19–32)
Calcium: 8.9 mg/dL (ref 8.4–10.5)
Creatinine, Ser: 0.62 mg/dL (ref 0.50–1.35)
Glucose, Bld: 104 mg/dL — ABNORMAL HIGH (ref 70–99)
Potassium: 3.3 mEq/L — ABNORMAL LOW (ref 3.7–5.3)
Sodium: 140 mEq/L (ref 137–147)

## 2014-04-14 LAB — CBC
HEMATOCRIT: 45.3 % (ref 39.0–52.0)
Hemoglobin: 15.2 g/dL (ref 13.0–17.0)
MCH: 31.9 pg (ref 26.0–34.0)
MCHC: 33.6 g/dL (ref 30.0–36.0)
MCV: 95.2 fL (ref 78.0–100.0)
Platelets: 432 10*3/uL — ABNORMAL HIGH (ref 150–400)
RBC: 4.76 MIL/uL (ref 4.22–5.81)
RDW: 15.7 % — AB (ref 11.5–15.5)
WBC: 11 10*3/uL — ABNORMAL HIGH (ref 4.0–10.5)

## 2014-04-14 MED ORDER — POTASSIUM CHLORIDE CRYS ER 20 MEQ PO TBCR
40.0000 meq | EXTENDED_RELEASE_TABLET | Freq: Once | ORAL | Status: AC
  Administered 2014-04-14: 40 meq via ORAL
  Filled 2014-04-14: qty 2

## 2014-04-14 NOTE — Clinical Social Work Note (Signed)
CSW provided patient with SNF offers- he plans to discuss with a family member who was at [facility] and I will f/u tomorrow- Patient in good spirits- asking for some ice cream-    Eduard Clos, MSW, [place]

## 2014-04-14 NOTE — Progress Notes (Addendum)
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 Pt had progressive nausea, vomiting and diarrhea over 2-3 days. Also developed new onset back pain. Was at home and slid down the bed. Was unable to get up from the floor for several hours because of weakness.   In the ER patient noted to be in A. Fib with heart rate into the 140s. Blood pressure in the 100s to 130s. Satting greater than 92% on room air. Notable labs include a white blood cell count 16.6, hemoglobin 19.2, platelets 96, sodium 131, creatinine 1.0, BUN 31, AST 254, ALT 89. Lactate 2.45. CK 12k. Trop 0.09, then <0.3. UA mildly indicative of infection. L. Spine x-ray noted general lumbar degeneration. Chest x-ray within normal limits.  After admit, the pt was discovered to be suffering with a Group B Strep bacteremia.  C/O back pain led to MRI imaging which revelaed a discitits/osteo in the lumbo-sacral region, but no defined abscess.  ID was consulted.  A TEE was negative for endocarditis.  His stay was complicated by the acute onset of Afib w/ RVR, for which Cardiology was consulted.  With improvement in his overall condition, and admin of amio (IV > PO) the pt has converted to NSR.    The plan is to complete 6 full weeks of abx tx, followed by repeat lumbosacral MRI.  This can be accomplished via [facility] per Dr. Johnnye Sima.    HPI/Subjective: Complains of dyspnea, congestion, also complains of constipation, last BM this am, still with moderate to severe low back pain, meds helping    Assessment/Plan:   Sepsis due to Goup B Strep - 2/2 blood cx -  w/ Lumbar  diskitis/osteomyelitis - sepsis physiology resolved -followed by ID, recommended 6 weeks of IV Rocephin start date 9/21 -MRI of spine noted "prominent fluid signal in the the L2-3 to L5-S1 disc spaces with marrow edema suspicious for discitis/osteomyelitis specifically at L2-3 and L4-5" with no evidence of infection in the thoracic spine  - TEE 9/23 noted no vegetations  - Pt to f/u with ID Dr. Johnnye Sima as outpatient -per Dr.McClung notes on 9/23, he spoke w/ Neurosurgery on call who recommended IV abx for full course of 6-8 weeks w/ f/u imaging - repeat blood cx from 9/21 no growth  - PICC line placed  Lactic acidosis  Resolved w/ volume expansion  Newly diagnosed Afib w/ RVR  Per Cardiology - meds converted to oral and pt tolerating well pt with no new complaints.  -was on IV heparin,  transitioned to PO eliquis  - plan is for amio at 400mg  BID for 1 week, then 200mg  BID afterward  Rhabdomyolysis  Ck peak 12k - resolved   Guaiac positive  In setting of diarrhea, significance of this is not clear  - Hgb stable / not anemic - agree that pt will need outpt screening colonoscopy on d/c  Transaminitis likely shock liver - improving w/ hydration, on last check within normal limits  Abdom pain - N/V - diarrhea  resolved  Thrombocytopenia Resolved   Morbid obesity - Body mass index is 31.63 kg/(m^2).  Dyspnea/cough, congestion -check CXR, suspect may be from volume overload (  iatrogenic), weight up 6kg since admission -IV lasix x2 doses, currently improvement in congestion, pt currently net negative -ECHO with preserved EF  Deconditioning  -Per notes most likely to SNF on d/c  Addendum Hypokalemia -Hypokalemia were placed orally -Reassess BMP  Code Status: FULL Family Communication: no family present at time of exam Disposition Plan: ambulate, SNF vs CIR  Consultants: Cardiology Neurology  ID  Antibiotics: Zosyn 9/17 > 9/20 Rocephin 9/20 >  DVT  prophylaxis: eliquis  Objective: Blood pressure 132/42, pulse 63, temperature 98.9 F (37.2 C), temperature source Oral, resp. rate 18, height 5\' 10"  (1.778 m), weight 100 kg (220 lb 7.4 oz), SpO2 97.00%.  Intake/Output Summary (Last 24 hours) at 04/14/14 1348 Last data filed at 04/14/14 1230  Gross per 24 hour  Intake    960 ml  Output   6450 ml  Net  -5490 ml   Exam: General: Pt in NAD, alert and awake Lungs: + rhales, no wheezes, [place] in place, no increased wob Cardiovascular: RRR,  no rubs   Abdomen: soft, nt, obese Extremities: No significant cyanosis, clubbing, 2 plus edema bilateral lower extremities  Data Reviewed: Basic Metabolic Panel:  Recent Labs Lab 04/08/14 0426 04/11/14 0450 04/14/14 0540  NA 141 142 140  K 3.9 3.7 3.3*  CL 102 101 99  CO2 30 30 32  GLUCOSE 100* 105* 104*  BUN 17 14 17   CREATININE 0.64 0.58 0.62  CALCIUM 8.3* 8.5 8.9  MG  --  2.0  --    Liver Function Tests:  Recent Labs Lab 04/11/14 0450  AST 27  ALT 26  ALKPHOS 101  BILITOT 0.6  PROT 6.2  ALBUMIN 2.4*   Coags: No results found for this basename: PT, INR,  in the last 168 hours CBC:  Recent Labs Lab 04/08/14 0426 04/11/14 0450 04/14/14 0540  WBC 13.7* 12.2* 11.0*  NEUTROABS  --  10.3*  --   HGB 16.2 16.1 15.2  HCT 46.9 47.6 45.3  MCV 94.2 96.9 95.2  PLT 347 384 432*    Recent Results (from the past 240 hour(s))  CULTURE, BLOOD (ROUTINE X 2)     Status: None   Collection Time    04/05/14  7:07 PM      Result Value Ref Range Status   Specimen Description BLOOD LEFT ARM   Final   Special Requests BOTTLES DRAWN AEROBIC AND ANAEROBIC 8CC   Final   Culture  Setup Time     Final   Value: 04/06/2014 01:37     Performed at [facility]   Culture     Final   Value: NO GROWTH 5 DAYS     Performed at [facility]   Report Status 04/12/2014 FINAL   Final     Studies:  Recent x-ray studies have been reviewed in detail by the Attending  Physician  Scheduled Meds:  Scheduled Meds: . amiodarone  200 mg Oral BID  . antiseptic oral rinse  7 mL Mouth Rinse BID  . apixaban  5 mg Oral BID  . bisacodyl  5 mg Oral TID  . cefTRIAXone (ROCEPHIN)  IV  2 g Intravenous Q24H  . diltiazem  360 mg Oral Daily  . hydrALAZINE  25 mg Oral 4 times per day  . losartan  50 mg Oral Daily  . methocarbamol  500 mg Oral TID  . OxyCODONE  10 mg Oral Q12H  . polyethylene glycol  17 g Oral BID  . senna-docusate  1 tablet Oral BID  . sodium chloride  10-40 mL Intracatheter Q12H  . sodium chloride  3 mL Intravenous Q12H    Time spent on care of this patient: 35 mins   Velvet Bathe , MD  [id number]  [facility] Office  [phone] Pager - Text Page per Amion as per below:  On-Call/Text Page:      [email or web address]      password [email or web address]  If 7PM-7AM, please contact night-coverage [email or web address] Password [email or web address] 04/14/2014, 1:48 PM   LOS: 13 days

## 2014-04-14 NOTE — Progress Notes (Signed)
Rehab admissions - I met with pt in follow up to rehab MD consult and explained the possibility of inpatient rehab. Pt had confusion throughout our discussion and information was repeated several times for reinforcement. Questions were answered.  Unfortunately, pt lives home alone and has no support in the area. Rehab MD projected goals stated that pt would need supervision at the end of a possible rehab stay. Pt stated there is no one who could provide him support.   Pt has [employer] and I am also not confident that they would give authorization for inpatient rehab based on his current diagnosis of lumbar diskitis.  We are recommending the skilled nursing be pursued in light of no available family support. Pt was in agreement with this and was told that social worker would follow up with him.  I updated Almyra Free, case Freight forwarder and Marcie Bal, Education officer, museum as well. Rn also aware.  I will now sign off pt's case and recommend SNF be pursued.   Please call me with any questions. Thanks.  Nanetta Batty, PT Rehabilitation Admissions Coordinator [phone]

## 2014-04-14 NOTE — Progress Notes (Signed)
Physical Therapy Treatment Patient Details Name: [name] MRN: [id number] DOB: Sep 27, 1940 Today's Date: 04/14/2014    History of Present Illness 73 y.o. 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 Fib with RVR. Pt found to have Group B Strep bacteremia and MRI revealed discitis/osteo in lumbosacral region. Pt also with rhabdo after lying on the floor for extended time.    PT Comments    Pt making steady progress.  Follow Up Recommendations  SNF     Equipment Recommendations  Rolling walker with 5" wheels    Recommendations for Other Services       Precautions / Restrictions Precautions Precautions: Fall    Mobility  Bed Mobility Overal bed mobility: Needs Assistance Bed Mobility: Supine to Sit     Supine to sit: Mod assist;HOB elevated     General bed mobility comments: Verbal cues for technique. Assist to bring trunk up and pt used rail.  Transfers Overall transfer level: Needs assistance Equipment used: Rolling walker (2 wheeled);Ambulation equipment used Transfers: Sit to/from [facility] Sit to Stand: +2 physical assistance;Min assist;Mod assist Stand pivot transfers: Min assist       General transfer comment: Able to perform with min A when using [place]. Required +2 mod A when using walker.  Ambulation/Gait Ambulation/Gait assistance: +2 physical assistance;Min assist Ambulation Distance (Feet): 5 Feet (x 2) Assistive device: Rolling walker (2 wheeled) Gait Pattern/deviations: Step-to pattern;Decreased step length - right;Decreased step length - left;Shuffle;Trunk flexed Gait velocity: slow Gait velocity interpretation: Below normal speed for age/gender General Gait Details: Verbal cues to stand more erect. Pt with heavy reliance on arms.   Stairs             Wheelchair Mobility    Modified Rankin (Stroke Patients Only)       Balance Overall balance assessment: Needs assistance Sitting-balance support: No upper extremity supported;Feet supported Sitting balance-Leahy Scale: Fair     Standing balance support: Bilateral upper extremity supported Standing balance-Leahy Scale: Poor Standing balance comment: Requires support of walker and min A.                    Cognition Arousal/Alertness: Awake/alert Behavior During Therapy: WFL for tasks assessed/performed Overall Cognitive Status: Within Functional Limits for tasks assessed                      Exercises      General Comments        Pertinent Vitals/Pain Pain Assessment: Faces Faces Pain Scale: Hurts whole lot Pain Location: low back Pain Descriptors / Indicators: Grimacing;Sharp Pain Intervention(s): Repositioned;Limited activity within patient's tolerance;Monitored during session    Home Living                      Prior Function            PT Goals (current goals can now be found in the care plan section) Progress towards PT goals: Progressing toward goals    Frequency  Min 3X/week    PT Plan Discharge plan needs to be updated    Co-evaluation             End of Session Equipment Utilized During Treatment: Gait belt;Other (comment) [name]) Activity Tolerance: Patient limited by pain Patient left: in chair;with call bell/phone within reach;with family/visitor present     Time:  1438-8875 PT Time Calculation (min): 18 min  Charges:  $Gait Training: 8-22 mins                    G Codes:      [name] 2014-05-09, 9:35 AM  [facility] PT [phone]

## 2014-04-14 NOTE — Evaluation (Signed)
Occupational Therapy Evaluation Patient Details Name: [name] MRN: [id number] DOB: [birth year] Today's Date: 04/14/2014    History of Present Illness 73 y.o. 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 Fib with RVR. Pt found to have Group B Strep bacteremia and MRI revealed discitis/osteo in lumbosacral region. Pt also with rhabdo after lying on the floor for extended time.   Clinical Impression   Pt admitted with above. He demonstrates the below listed deficits and will benefit from continued OT to maximize safety and independence with BADLs.  Pt presents to OT with generalized weakness, LBP, and impaired balance.  Currently, he requires max A for BADLs.  Recommend SNF level rehab at discharge.       Follow Up Recommendations  SNF    Equipment Recommendations  None recommended by OT    Recommendations for Other Services       Precautions / Restrictions Precautions Precautions: Fall      Mobility Bed Mobility Overal bed mobility: Needs Assistance Bed Mobility: Supine to Sit;Sit to Supine     Supine to sit: Mod assist;HOB elevated Sit to supine: Max assist   General bed mobility comments: verbal cues for technique.  Assist to lift trunk and manage LEs  Transfers Overall transfer level: Needs assistance   Transfers: Sit to/from Stand           General transfer comment: Pt unable to move into standing with max A +1 this pm due to fatigue and LBP    Balance Overall balance assessment: Needs assistance Sitting-balance support: Feet supported;Bilateral upper extremity supported Sitting balance-Leahy Scale: Poor Sitting balance - Comments: Is dependent upon UE support     Standing balance-Leahy Scale: Zero                              ADL Overall ADL's : Needs  assistance/impaired Eating/Feeding: Independent   Grooming: Wash/dry hands;Wash/dry face;Oral care;Brushing hair;Bed level   Upper Body Bathing: Minimal assitance;Bed level   Lower Body Bathing: Maximal assistance;Bed level   Upper Body Dressing : Minimal assistance;Bed level   Lower Body Dressing: Maximal assistance;Bed level   Toilet Transfer: Total assistance (Unable this pm due to pain and fatigue)   Toileting- Clothing Manipulation and Hygiene: Total assistance;Bed level;Sitting/lateral lean       Functional mobility during ADLs:  (unable to achieve full standing ) General ADL Comments: Pt is very fatigued this pm.  He is unable to access feet.  Unable to achieve full standing this pm for functional transfers and ADL tasks      Vision                     Perception     Praxis      Pertinent Vitals/Pain Pain Assessment: Faces Faces Pain Scale: Hurts even more Pain Location: Low back  Pain Descriptors / Indicators: Aching Pain Intervention(s): Monitored during session;Limited activity within patient's tolerance;Repositioned     Hand Dominance     Extremity/Trunk Assessment Upper Extremity Assessment Upper Extremity Assessment: Generalized weakness   Lower Extremity Assessment Lower Extremity Assessment: Defer to PT evaluation       Communication Communication Communication: No difficulties   Cognition Arousal/Alertness: Awake/alert Behavior During Therapy: WFL for tasks assessed/performed Overall Cognitive Status: Within Functional Limits for tasks assessed (basic assessment)  General Comments       Exercises       Shoulder Instructions      Home Living Family/patient expects to be discharged to:: Skilled nursing facility                                        Prior Functioning/Environment Level of Independence: Independent        Comments: retired. Plays golf 3 days/week and rides  motorcycle.    OT Diagnosis: Generalized weakness;Acute pain   OT Problem List: Decreased strength;Decreased activity tolerance;Impaired balance (sitting and/or standing);Decreased safety awareness;Decreased knowledge of use of DME or AE;Obesity;Pain   OT Treatment/Interventions: Self-care/ADL training;DME and/or AE instruction;Therapeutic activities;Patient/family education;Balance training    OT Goals(Current goals can be found in the care plan section) Acute Rehab OT Goals Patient Stated Goal: To regain independence and reduce pain  OT Goal Formulation: With patient Time For Goal Achievement: 04/28/14 Potential to Achieve Goals: Good ADL Goals Pt Will Perform Grooming: with supervision;sitting Pt Will Perform Upper Body Bathing: with supervision;sitting Pt Will Perform Lower Body Bathing: with min assist;with adaptive equipment;sit to/from stand Pt Will Perform Upper Body Dressing: with supervision;sitting Pt Will Perform Lower Body Dressing: with min assist;sit to/from stand Pt Will Transfer to Toilet: with min assist Pt Will Perform Toileting - Clothing Manipulation and hygiene: sit to/from stand;with min assist  OT Frequency: Min 2X/week   Barriers to D/C: Decreased caregiver support          Co-evaluation              End of Session Nurse Communication: Mobility status  Activity Tolerance: Patient limited by fatigue Patient left: in bed;with call bell/phone within reach   Time: 1724-1741 OT Time Calculation (min): 17 min Charges:  OT General Charges $OT Visit: 1 Procedure OT Evaluation $Initial OT Evaluation Tier I: 1 Procedure OT Treatments $Therapeutic Activity: 8-22 mins G-Codes:    Jaymarie Yeakel M 15-Apr-2014, 6:12 PM

## 2014-04-15 DIAGNOSIS — I4891 Unspecified atrial fibrillation: Secondary | ICD-10-CM

## 2014-04-15 LAB — BASIC METABOLIC PANEL
Anion gap: 10 (ref 5–15)
BUN: 16 mg/dL (ref 6–23)
CHLORIDE: 100 meq/L (ref 96–112)
CO2: 31 mEq/L (ref 19–32)
CREATININE: 0.69 mg/dL (ref 0.50–1.35)
Calcium: 8.6 mg/dL (ref 8.4–10.5)
GFR calc non Af Amer: >90 mL/min (ref >90)
Glucose, Bld: 115 mg/dL — ABNORMAL HIGH (ref 70–99)
Potassium: 3.7 mEq/L (ref 3.7–5.3)
Sodium: 141 mEq/L (ref 137–147)

## 2014-04-15 MED ORDER — HEPARIN SOD (PORK) LOCK FLUSH 100 UNIT/ML IV SOLN
250.0000 [IU] | INTRAVENOUS | Status: AC | PRN
  Administered 2014-04-15: 13:00:00

## 2014-04-15 MED ORDER — LOSARTAN POTASSIUM 50 MG PO TABS: 50.0000 mg | ORAL_TABLET | Freq: Every day | ORAL | Status: DC

## 2014-04-15 MED ORDER — AMIODARONE HCL 200 MG PO TABS: 200.0000 mg | ORAL_TABLET | Freq: Two times a day (BID) | ORAL | Status: DC

## 2014-04-15 MED ORDER — METHOCARBAMOL 500 MG PO TABS: 500.0000 mg | ORAL_TABLET | Freq: Three times a day (TID) | ORAL | Status: DC

## 2014-04-15 MED ORDER — DILTIAZEM HCL ER COATED BEADS 360 MG PO CP24: 360.0000 mg | ORAL_CAPSULE | Freq: Every day | ORAL | Status: DC

## 2014-04-15 MED ORDER — SENNOSIDES-DOCUSATE SODIUM 8.6-50 MG PO TABS: 1.0000 | ORAL_TABLET | Freq: Two times a day (BID) | ORAL | Status: DC

## 2014-04-15 MED ORDER — HYDRALAZINE HCL 25 MG PO TABS: 25.0000 mg | ORAL_TABLET | Freq: Four times a day (QID) | ORAL | Status: DC

## 2014-04-15 MED ORDER — APIXABAN 5 MG PO TABS: 5.0000 mg | ORAL_TABLET | Freq: Two times a day (BID) | ORAL | Status: DC

## 2014-04-15 MED ORDER — DEXTROSE 5 % IV SOLN: 2.0000 g | INTRAVENOUS | Status: DC

## 2014-04-15 MED ORDER — OXYCODONE HCL ER 10 MG PO T12A: 10.0000 mg | EXTENDED_RELEASE_TABLET | Freq: Two times a day (BID) | ORAL | Status: DC

## 2014-04-15 NOTE — Progress Notes (Signed)
Assessment unchanged. Tele removed. PICC unhooked from IVF by IV Team.  Report called to facility. Pt left with belongings accompanied by EMS.

## 2014-04-15 NOTE — Clinical Social Work Placement (Signed)
Clinical Social Work Department CLINICAL SOCIAL WORK PLACEMENT NOTE 04/15/2014  Patient:  [name], [name]  Account Number:  [id number] Admit date:  04/01/2014  Clinical Social Worker:  Daiva Huge  Date/time:  04/13/2014 03:52 PM  Clinical Social Work is seeking post-discharge placement for this patient at the following level of care:   SKILLED NURSING   (*CSW will update this form in Epic as items are completed)   04/13/2014  Patient/family provided with [place] Department of Clinical Social Work's list of facilities offering this level of care within the geographic area requested by the patient (or if unable, by the patient's family).  04/13/2014  Patient/family informed of their freedom to choose among providers that offer the needed level of care, that participate in Medicare, Medicaid or managed care program needed by the patient, have an available bed and are willing to accept the patient.  04/13/2014  Patient/family informed of [facility]' ownership interest in [facility], as well as of the fact that they are under no obligation to receive care at this facility.  PASARR submitted to EDS on 04/13/2014 PASARR number received on 04/13/2014  FL2 transmitted to all facilities in geographic area requested by pt/family on  04/13/2014 FL2 transmitted to all facilities within larger geographic area on   Patient informed that his/her managed care company has contracts with or will negotiate with  certain facilities, including the following:     Patient/family informed of bed offers received:  04/14/2014 Patient chooses bed at [facility], [place] Physician recommends and patient chooses bed at    Patient to be transferred to [place] on  04/15/2014 Patient to be transferred to facility by ems Patient and family notified of transfer on 04/15/2014 Name of family member notified:  patient and his pastor per patient  The  following physician request were entered in Epic:   Additional Comments: Eduard Clos, MSW, Latanya Presser [phone]

## 2014-04-15 NOTE — Clinical Social Work Note (Signed)
Patient for d/c today to SNF bed at [facility]. He is agreeable and eager to continue his rehab and get home- missing his golf days and independence. Insurance auth rec'd for SNF-  plan transfer via EMS. Eduard Clos, MSW, [place]

## 2014-04-15 NOTE — Discharge Summary (Signed)
Physician Discharge Summary  [name] WIO:[id number] DOB: [birth year] DOA: 04/01/2014  PCP: [name], MD  Admit date: 04/01/2014 Discharge date: 04/15/2014  Time spent: > 35  minutes  Recommendations for Outpatient Follow-up:  1. Repeat Lumbar MRI in 6 wks 2. F/u with Dr. Johnnye Sima in 1 month or sooner should any new concerns arise 3. Pt will require routine picc line care at facility  4. Pt will need outpatient colonoscopy screening   Discharge Condition: stable  Diet recommendation: Heart healthy  Filed Weights   04/13/14 0450 04/14/14 0426 04/15/14 0500  Weight: 101 kg (222 lb 10.6 oz) 100 kg (220 lb 7.4 oz) 97.7 kg (215 lb 6.2 oz)    History of present illness:  From original HPI: This is a 73 y.o. 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 Fib with RVR. Pt states that he was in otherwise normal state of health up until Monday when pt had progressive nausea, vomiting and diarrhea over 2-3 days. Also developed new onset back pain.   Hospital Course:  Sepsis due to Goup B Strep - 2/2 blood cx - w/ Lumbar diskitis/osteomyelitis  - sepsis physiology resolved  -followed by ID, recommended 6 weeks of IV Rocephin start date 9/21  -MRI of spine noted "prominent fluid signal in the the L2-3 to L5-S1 disc spaces with marrow edema suspicious for discitis/osteomyelitis specifically at L2-3 and L4-5" with no evidence of infection in the thoracic spine  - TEE 9/23 noted no vegetations  - Pt to f/u with ID Dr. Johnnye Sima as outpatient  -per Dr.McClung notes on 9/23, he spoke w/ Neurosurgery on call who recommended IV abx for full course of 6-8 weeks w/ f/u imaging - repeat blood cx from 9/21 no growth  - PICC line placed and will remain until 6 wks of Rocephin  is completed.  Lactic acidosis  Resolved w/ volume expansion   Newly diagnosed Afib w/ RVR  Per Cardiology - meds converted to oral and pt tolerating well pt with no new complaints.  -was on IV heparin, transitioned to PO eliquis  - was on amio at 400mg  BID for 1 week, now on 200mg  BID afterward   Rhabdomyolysis  Ck peak 12k - resolved   Guaiac positive  In setting of diarrhea, significance of this is not clear  - Hgb stable / not anemic - agree that pt will need outpt screening colonoscopy on d/c   Transaminitis  likely shock liver - improving w/ hydration, on last check within normal limits   Abdom pain - N/V - diarrhea  resolved   Thrombocytopenia  Resolved   Morbid obesity - Body mass index is 31.63 kg/(m^2).   Dyspnea/cough, congestion  -resolved after lasix administration.  Deconditioning  -Per notes most likely to SNF on d/c   Hypokalemia  -Hypokalemia were placed orally  -Reassess BMP   Procedures:  Please see above  Consultations:  Cardiology  ID  Neurology  Discharge Exam: Filed Vitals:   04/15/14 1039  BP: 154/53  Pulse:   Temp:   Resp:     General: Pt in nad, alert and awake Cardiovascular: rrr, no mrg Respiratory: cta bl, no wheezes  Discharge Instructions You were cared for by a hospitalist during your hospital stay. If you have any questions about your discharge medications or the care you received while  you were in the hospital after you are discharged, you can call the unit and asked to speak with the hospitalist on call if the hospitalist that took care of you is not available. Once you are discharged, your primary care physician will handle any further medical issues. Please note that NO REFILLS for any discharge medications will be authorized once you are discharged, as it is imperative that you return to your primary care physician (or establish a relationship with a primary care physician if you do not have one) for your aftercare  needs so that they can reassess your need for medications and monitor your lab values.  Discharge Instructions   Call MD for:  difficulty breathing, headache or visual disturbances    Complete by:  As directed      Call MD for:  extreme fatigue    Complete by:  As directed      Call MD for:  redness, tenderness, or signs of infection (pain, swelling, redness, odor or green/yellow discharge around incision site)    Complete by:  As directed      Call MD for:  severe uncontrolled pain    Complete by:  As directed      Call MD for:  temperature >100.4    Complete by:  As directed      Diet - low sodium heart healthy    Complete by:  As directed      Increase activity slowly    Complete by:  As directed           Current Discharge Medication List    START taking these medications   Details  amiodarone (PACERONE) 200 MG tablet Take 1 tablet (200 mg total) by mouth 2 (two) times daily. Qty: 60 tablet, Refills: 0    apixaban (ELIQUIS) 5 MG TABS tablet Take 1 tablet (5 mg total) by mouth 2 (two) times daily. Qty: 60 tablet, Refills: 0    cefTRIAXone 2 g in dextrose 5 % 50 mL Inject 2 g into the vein daily.    diltiazem (CARDIZEM CD) 360 MG 24 hr capsule Take 1 capsule (360 mg total) by mouth daily. Qty: 30 capsule, Refills: 0    hydrALAZINE (APRESOLINE) 25 MG tablet Take 1 tablet (25 mg total) by mouth every 6 (six) hours. Qty: 120 tablet, Refills: 0    losartan (COZAAR) 50 MG tablet Take 1 tablet (50 mg total) by mouth daily. Qty: 30 tablet, Refills: 0    methocarbamol (ROBAXIN) 500 MG tablet Take 1 tablet (500 mg total) by mouth 3 (three) times daily. Qty: 60 tablet, Refills: 0    OxyCODONE (OXYCONTIN) 10 mg T12A 12 hr tablet Take 1 tablet (10 mg total) by mouth every 12 (twelve) hours. Qty: 60 tablet, Refills: 0    senna-docusate (SENOKOT-S) 8.6-50 MG per tablet Take 1 tablet by mouth 2 (two) times daily.      STOP taking these medications     aspirin EC 81 MG tablet         No Known Allergies    The results of significant diagnostics from this hospitalization (including imaging, microbiology, ancillary and laboratory) are listed below for reference.    Significant Diagnostic Studies: Dg Chest 2 View  04/13/2014   CLINICAL DATA:  Dyspnea.  EXAM: CHEST  2 VIEW  COMPARISON:  None.  FINDINGS: The heart size and mediastinal contours are within normal limits. Left lung is clear. Stable opacity seen medially in right lung base. No pneumothorax is  noted. Right-sided PICC line is noted with distal tip projected over the expected position of the SVC. Small bilateral pleural effusions are noted. The visualized skeletal structures are unremarkable.  IMPRESSION: Small bilateral pleural effusions are noted. Stable opacity seen medially in right lung base consistent with subsegmental atelectasis or possibly pneumonia.   Electronically Signed   By: Sabino Dick M.D.   On: 04/13/2014 14:21   Dg Chest 2 View  04/01/2014   CLINICAL DATA:  Shortness of breath.  Cough.  EXAM: CHEST  2 VIEW  COMPARISON:  CT scan dated 08/23/2005  FINDINGS: Heart size and pulmonary vascularity are normal. Lungs are clear. No acute osseous abnormality. Prominent right first costochondral junction.  IMPRESSION: No active cardiopulmonary disease.   Electronically Signed   By: Rozetta Nunnery M.D.   On: 04/01/2014 17:25   Dg Lumbar Spine Complete  04/01/2014   CLINICAL DATA:  Low back pain.  EXAM: LUMBAR SPINE - COMPLETE 4+ VIEW  COMPARISON:  CT abdomen and pelvis 01/25/2013  FINDINGS: Mild lumbar levoscoliosis his again seen with severe asymmetric right-sided disc space narrowing at L2-3 and L3-4 with associated vertebral body sclerosis and bridging osteophytes. 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 Trace retrolisthesis of L2 on L3 is unchanged and likely degenerative. There is no evidence of compression fracture. Atherosclerotic aortic calcification is noted.  IMPRESSION: Lumbar  levoscoliosis with advanced multilevel disc degeneration. No acute osseous abnormality identified.   Electronically Signed   By: Logan Bores   On: 04/01/2014 17:27   Mr Thoracic Spine W Wo Contrast  04/06/2014   CLINICAL DATA:  Bacteremia and back pain.  EXAM: MRI THORACIC AND LUMBAR SPINE WITHOUT AND WITH CONTRAST  TECHNIQUE: Multiplanar and multiecho pulse sequences of the thoracic and lumbar spine were obtained without and with intravenous contrast.  CONTRAST:  26mL MULTIHANCE GADOBENATE DIMEGLUMINE 529 MG/ML IV SOLN  COMPARISON:  CT abdomen and pelvis 04/01/2014. Lumbar spine radiographs 04/01/2014. Chest CT 08/23/2005.  FINDINGS: MR THORACIC SPINE FINDINGS  Vertebral alignment is within normal limits. No compression fracture is identified. Scattered, small Schmorl's nodes are noted in the mid and lower thoracic spine. Mild degenerative endplate changes are present in the mid and lower thoracic spine. No vertebral marrow edema is identified, although evaluation is limited by low signal through the lower thoracic spine. No abnormal enhancement is identified. Mild subcutaneous soft tissue edema is noted, predominantly in the lower back.  Axial images are degraded by motion artifact. Spinal cord is normal in caliber and grossly normal in signal. Small right central disc protrusion at T4-5 and small central disc protrusion at T9-10 do not result in stenosis. More diffuse disc bulging at T10-11 minimally effaces the ventral thecal sac without significant spinal canal stenosis. There are small bilateral pleural effusions. Abnormal signal is present in the right greater than left lower lobes of the lungs.  MR LUMBAR SPINE FINDINGS  Examination is severely limited by motion artifact and low signal to noise.  There is straightening of the normal lumbar lordosis. Mild lumbar levoscoliosis is present. There is trace retrolisthesis of L2 on L3, unchanged. Moderate to severe disc space narrowing is again seen from L2-3  to L5-S1. There is prominent T2/ STIR hyperintensity in the disc spaces at each of these 4 levels. There is also evidence of marrow edema and enhancement in the right aspects of the vertebral bodies adjacent to the L2-3 disc space and there is asymmetric fluid in the right L2-3 facet joint. There  is also likely paravertebral soft tissue edema and enhancement to the right of the L2 and L3 vertebral bodies with some involvement of the psoas muscle.  There is mild marrow edema and enhancement in the vertebral bodies adjacent to the L4-5 disc space, greater left of midline. There is abnormal T2 hyperintense, heterogeneously enhancing material in the ventral epidural space behind the vertebral bodies extending from the L3-4 disc space level to the S1 vertebral body level, and this may communicate with the L4-5 disc space. This contributes to moderate to severe spinal stenosis at and just cranial to the L4-5 disc space. Prior left hemilaminectomies are identified at L4-5 and L5-S1.  Asymmetric right facet arthrosis and small facet joint effusions are also noted at L1-2 and L3-4. 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 Conus medullaris terminates at T12-L1.  IMPRESSION: 1. Motion degraded study as above. Prominent fluid signal in the the L2-3 to L5-S1 disc spaces with marrow edema suspicious for discitis/osteomyelitis specifically at L2-3 and L4-5. Ventral epidural material extending from L4-S1 likely represents phlegmon, potentially arising from the L4-5 disc space and contributing to prominent spinal stenosis at the L4 level. 2. Facet joint fluid at L1-2, L2-3, and L3-4 may reflect advanced chronic facet arthritis although septic arthritis cannot be excluded, particularly at L2-3. 3. No evidence of infection in the thoracic spine. 4. Small bilateral pleural effusions and abnormal signal in the lower lobes. Evaluation is limited by motion,  however pneumonia is a consideration in addition to atelectasis. Repeat chest radiographs are recommended. These results were called by telephone at the time of interpretation on 04/06/2014 at 5:09 pm to Dr. Sherral Hammers, who verbally acknowledged these results.   Electronically Signed   By: Logan Bores   On: 04/06/2014 17:11   Mr Lumbar Spine W Wo Contrast  04/06/2014   CLINICAL DATA:  Bacteremia and back pain.  EXAM: MRI THORACIC AND LUMBAR SPINE WITHOUT AND WITH CONTRAST  TECHNIQUE: Multiplanar and multiecho pulse sequences of the thoracic and lumbar spine were obtained without and with intravenous contrast.  CONTRAST:  38mL MULTIHANCE GADOBENATE DIMEGLUMINE 529 MG/ML IV SOLN  COMPARISON:  CT abdomen and pelvis 04/01/2014. Lumbar spine radiographs 04/01/2014. Chest CT 08/23/2005.  FINDINGS: MR THORACIC SPINE FINDINGS  Vertebral alignment is within normal limits. No compression fracture is identified. Scattered, small Schmorl's nodes are noted in the mid and lower thoracic spine. Mild degenerative endplate changes are present in the mid and lower thoracic spine. No vertebral marrow edema is identified, although evaluation is limited by low signal through the lower thoracic spine. No abnormal enhancement is identified. Mild subcutaneous soft tissue edema is noted, predominantly in the lower back.  Axial images are degraded by motion artifact. Spinal cord is normal in caliber and grossly normal in signal. Small right central disc protrusion at T4-5 and small central disc protrusion at T9-10 do not result in stenosis. More diffuse disc bulging at T10-11 minimally effaces the ventral thecal sac without significant spinal canal stenosis. There are small bilateral pleural effusions. Abnormal signal is present in the right greater than left lower lobes of the lungs.  MR LUMBAR SPINE FINDINGS  Examination is severely limited by motion artifact and low signal to noise.  There is straightening of the normal lumbar lordosis.  Mild lumbar levoscoliosis is present. There is trace retrolisthesis of L2 on L3, unchanged. Moderate to severe disc space narrowing is again seen from L2-3 to  L5-S1. There is prominent T2/ STIR hyperintensity in the disc spaces at each of these 4 levels. There is also evidence of marrow edema and enhancement in the right aspects of the vertebral bodies adjacent to the L2-3 disc space and there is asymmetric fluid in the right L2-3 facet joint. There is also likely paravertebral soft tissue edema and enhancement to the right of the L2 and L3 vertebral bodies with some involvement of the psoas muscle.  There is mild marrow edema and enhancement in the vertebral bodies adjacent to the L4-5 disc space, greater left of midline. There is abnormal T2 hyperintense, heterogeneously enhancing material in the ventral epidural space behind the vertebral bodies extending from the L3-4 disc space level to the S1 vertebral body level, and this may communicate with the L4-5 disc space. This contributes to moderate to severe spinal stenosis at and just cranial to the L4-5 disc space. Prior left hemilaminectomies are identified at L4-5 and L5-S1.  Asymmetric right facet arthrosis and small facet joint effusions are also noted at L1-2 and L3-4. 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 Conus medullaris terminates at T12-L1.  IMPRESSION: 1. Motion degraded study as above. Prominent fluid signal in the the L2-3 to L5-S1 disc spaces with marrow edema suspicious for discitis/osteomyelitis specifically at L2-3 and L4-5. Ventral epidural material extending from L4-S1 likely represents phlegmon, potentially arising from the L4-5 disc space and contributing to prominent spinal stenosis at the L4 level. 2. Facet joint fluid at L1-2, L2-3, and L3-4 may reflect advanced chronic facet arthritis although septic arthritis cannot be excluded, particularly at L2-3. 3. No  evidence of infection in the thoracic spine. 4. Small bilateral pleural effusions and abnormal signal in the lower lobes. Evaluation is limited by motion, however pneumonia is a consideration in addition to atelectasis. Repeat chest radiographs are recommended. These results were called by telephone at the time of interpretation on 04/06/2014 at 5:09 pm to Dr. Sherral Hammers, who verbally acknowledged these results.   Electronically Signed   By: Logan Bores   On: 04/06/2014 17:11   Ct Abdomen Pelvis W Contrast  04/01/2014   CLINICAL DATA:  Lower back pain and generalized weakness. Vomiting and anorexia.  EXAM: CT ABDOMEN AND PELVIS WITH CONTRAST  TECHNIQUE: Multidetector CT imaging of the abdomen and pelvis was performed using the standard protocol following bolus administration of intravenous contrast.  CONTRAST:  137mL OMNIPAQUE IOHEXOL 300 MG/ML  SOLN  COMPARISON:  CT of the abdomen and pelvis from 01/25/2013  FINDINGS: Minimal bibasilar atelectasis is noted. Scattered coronary artery calcifications are seen.  A stable 2.6 cm hypodensity within the left hepatic lobe likely reflects a cyst. The liver and spleen are otherwise unremarkable. The gallbladder is borderline normal in size, and grossly unremarkable in appearance. The pancreas and adrenal glands are unremarkable.  Diffuse perinephric stranding is noted. As this is new from the prior study, it raises concern for mild diffuse bilateral pyelonephritis.  A 9 mm cyst is noted at the upper pole of the right kidney. Scattered vascular calcifications are noted at the right renal hilum. The kidneys are otherwise unremarkable. There is no evidence of hydronephrosis. No renal or ureteral stones are identified.  Trace free fluid is seen tracking along the mid to distal left ureter, at the left lower quadrant.  The small bowel is unremarkable in appearance. The stomach is within normal limits. No acute vascular abnormalities are seen.  Indication is status post  appendectomy. Contrast progresses to the level of the sigmoid colon. The colon is unremarkable in appearance.  The bladder is mildly distended and grossly unremarkable. The prostate is mildly enlarged, measuring 4.9 cm in transverse dimension. No inguinal lymphadenopathy is seen.  No acute osseous abnormalities are identified. Multilevel vacuum phenomenon, endplate sclerotic change and disc space narrowing are noted along the lumbar spine.  IMPRESSION: 1. Diffuse perinephric stranding, new from the prior study. This raises concern for mild diffuse bilateral pyelonephritis. Would correlate for associated symptoms. 2. Small right renal cyst and likely hepatic cyst. 3. Scattered coronary artery calcifications seen. 4. Mildly enlarged prostate. 5. Mild degenerative change noted along the lumbar spine.   Electronically Signed   By: Garald Balding M.D.   On: 04/01/2014 21:50   Dg Chest Port 1 View  04/11/2014   CLINICAL DATA:  Increased work of breathing, shortness of breath, history smoking  EXAM: PORTABLE CHEST - 1 VIEW  COMPARISON:  04/01/2014  FINDINGS: Normal heart size, mediastinal contours, and pulmonary vascularity.  Atelectasis versus infiltrate at medial RIGHT lower lobe.  Remaining lungs clear.  No pleural effusion or pneumothorax.  Bones unremarkable.  IMPRESSION: Atelectasis versus infiltrate at medial RIGHT lower lobe.   Electronically Signed   By: Lavonia Dana M.D.   On: 04/11/2014 13:11    Microbiology: Recent Results (from the past 240 hour(s))  CULTURE, BLOOD (ROUTINE X 2)     Status: None   Collection Time    04/05/14  7:07 PM      Result Value Ref Range Status   Specimen Description BLOOD LEFT ARM   Final   Special Requests BOTTLES DRAWN AEROBIC AND ANAEROBIC 8CC   Final   Culture  Setup Time     Final   Value: 04/06/2014 01:37     Performed at [facility]   Culture     Final   Value: NO GROWTH 5 DAYS     Performed at [facility]   Report Status 04/12/2014 FINAL    Final     Labs: Basic Metabolic Panel:  Recent Labs Lab 04/11/14 0450 04/14/14 0540 04/15/14 0450  NA 142 140 141  K 3.7 3.3* 3.7  CL 101 99 100  CO2 30 32 31  GLUCOSE 105* 104* 115*  BUN 14 17 16   CREATININE 0.58 0.62 0.69  CALCIUM 8.5 8.9 8.6  MG 2.0  --   --    Liver Function Tests:  Recent Labs Lab 04/11/14 0450  AST 27  ALT 26  ALKPHOS 101  BILITOT 0.6  PROT 6.2  ALBUMIN 2.4*   No results found for this basename: LIPASE, AMYLASE,  in the last 168 hours No results found for this basename: AMMONIA,  in the last 168 hours CBC:  Recent Labs Lab 04/11/14 0450 04/14/14 0540  WBC 12.2* 11.0*  NEUTROABS 10.3*  --   HGB 16.1 15.2  HCT 47.6 45.3  MCV 96.9 95.2  PLT 384 432*   Cardiac Enzymes: No results found for this basename: CKTOTAL, CKMB, CKMBINDEX, TROPONINI,  in the last 168 hours BNP: BNP (last 3 results)  Recent Labs  04/01/14 2018  PROBNP 2858.0*   CBG: No results found for this basename: GLUCAP,  in the last 168 hours     Signed:  Velvet Bathe  [facility] 04/15/2014, 1:06 PM

## 2014-04-16 ENCOUNTER — Non-Acute Institutional Stay (SKILLED_NURSING_FACILITY): Payer: Medicare HMO | Admitting: Internal Medicine

## 2014-04-16 ENCOUNTER — Encounter: Payer: Self-pay | Admitting: Internal Medicine

## 2014-04-16 DIAGNOSIS — I10 Essential (primary) hypertension: Secondary | ICD-10-CM

## 2014-04-16 DIAGNOSIS — M545 Low back pain, unspecified: Secondary | ICD-10-CM

## 2014-04-16 DIAGNOSIS — R5381 Other malaise: Secondary | ICD-10-CM

## 2014-04-16 DIAGNOSIS — M7989 Other specified soft tissue disorders: Secondary | ICD-10-CM

## 2014-04-16 DIAGNOSIS — M869 Osteomyelitis, unspecified: Secondary | ICD-10-CM

## 2014-04-16 DIAGNOSIS — R195 Other fecal abnormalities: Secondary | ICD-10-CM

## 2014-04-16 DIAGNOSIS — I4891 Unspecified atrial fibrillation: Secondary | ICD-10-CM

## 2014-04-16 DIAGNOSIS — R0600 Dyspnea, unspecified: Secondary | ICD-10-CM

## 2014-04-16 DIAGNOSIS — R0609 Other forms of dyspnea: Secondary | ICD-10-CM | POA: Insufficient documentation

## 2014-04-16 HISTORY — DX: Essential (primary) hypertension: I10

## 2014-04-16 NOTE — Progress Notes (Signed)
Patient ID: [name], male   DOB: 07/22/1940, 73 y.o.   MRN: [id number]     Facility: [facility]   PCP: [name], MD  Code Status: full code  No Known Allergies  Chief Complaint: new admission  HPI:  73 y/o male patient is here for STR after hospital admission from 04/01/14-04/15/14 with acute back pain, sepsis and hypovolemia. He had group B strept grow in blood culture. He also had afib with RVR. 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 ID and cardilogy were consulted. He was started on iv antibiotics, rate controlling agent and eliquis. He had clinical improvement. Given his deconditioning, he was sent to [facility] for STR. He is seen in his room today. He mentions that his back is hurting and grades his pain as 8/10 with movement and cough. He has been having loose stool since last night. He complaints of abdominal gurgling but denies cramps. No blood in stool. Has some nausea but denies vomiting. He is able to walk with therapy with a walker and assistance. He feels tired  Review of Systems:  Constitutional: Negative for fever, chills,diaphoresis.  HENT: Negative for congestion and sore throat.   Eyes: Negative for eye pain, blurred vision  Respiratory: positive for cough, sputum production- white phlegm. Has dyspnea with exertion. Has been a smoker and has not smoked since hospitalization.  Cardiovascular: Negative for chest pain, palpitations. Positive for leg swelling.  Gastrointestinal: Negative for heartburn, vomiting, abdominal pain  Genitourinary: Negative for dysuria Musculoskeletal: Negative for falls Skin: Negative for itching and rash.  Neurological: Negative for dizziness, tingling, focal weakness and headaches.  Psychiatric/Behavioral: Negative for depression    Past Medical History  Diagnosis Date  . Acute appendicitis with peritoneal abscess 01/28/2013  .  Ileus, postoperative 01/28/2013  . Tobacco use disorder 01/28/2013  . Obesity, morbid 01/28/2013  . Shortness of breath    Past Surgical History  Procedure Laterality Date  . Joint replacement      bilateral knee surgery  . Back surgery    . Laparoscopic appendectomy N/A 01/25/2013    Procedure: APPENDECTOMY LAPAROSCOPIC;  Surgeon: Ralene Ok, MD;  Location: [facility];  Service: General;  Laterality: N/A;  . Tee without cardioversion N/A 04/07/2014    Procedure: TRANSESOPHAGEAL ECHOCARDIOGRAM (TEE);  Surgeon: Candee Furbish, MD;  Location: [facility];  Service: Cardiovascular;  Laterality: N/A;   Social History:   reports that he has been smoking Cigarettes.  He has a 75 pack-year smoking history. He does not have any smokeless tobacco history on file. He reports that he does not drink alcohol or use illicit drugs.  History reviewed. No pertinent family history.  Medications: Patient's Medications  New Prescriptions   No medications on file  Previous Medications   AMIODARONE (PACERONE) 200 MG TABLET    Take 1 tablet (200 mg total) by mouth 2 (two) times daily.   APIXABAN (ELIQUIS) 5 MG TABS TABLET    Take 1 tablet (5 mg total) by mouth 2 (two) times daily.   CEFTRIAXONE 2 G IN DEXTROSE 5 % 50 ML    Inject 2 g into the vein daily.   DILTIAZEM (CARDIZEM CD) 360 MG 24 HR CAPSULE    Take 1 capsule (360 mg total) by mouth daily.   HYDRALAZINE (APRESOLINE) 25 MG TABLET    Take 1 tablet (25 mg total) by mouth every 6 (six) hours.   LOSARTAN (COZAAR)  50 MG TABLET    Take 1 tablet (50 mg total) by mouth daily.   METHOCARBAMOL (ROBAXIN) 500 MG TABLET    Take 1 tablet (500 mg total) by mouth 3 (three) times daily.   OXYCODONE (OXYCONTIN) 10 MG T12A 12 HR TABLET    Take 1 tablet (10 mg total) by mouth every 12 (twelve) hours.   SENNA-DOCUSATE (SENOKOT-S) 8.6-50 MG PER TABLET    Take 1 tablet by mouth 2 (two) times daily.  Modified Medications   No medications on file  Discontinued Medications    No medications on file     Physical Exam: Filed Vitals:   04/16/14 1137  BP: 157/80  Pulse: 86  Temp: 98 F (36.7 C)  Resp: 18    General- elderly male in no acute distress Head- atraumatic, normocephalic Eyes- PERRLA, EOMI, no pallor, no icterus, no discharge Neck- no lymphadenopathy Throat- moist mucus membrane, upper dentures and lower partial dentures, poor dentition Nose- normal nasal mucosa, no maxillary or frontal sinus tenderness Cardiovascular- normal s1,s2, no murmurs Respiratory- bilateral poor air entry, no wheeze, no rhonchi, no crackles, no use of accessory muscles Abdomen- bowel sounds present, soft, non tender Musculoskeletal- able to move all 4 extremities, has spinal tenderness, using walker with assistance, leg edema 1+  Neurological- no focal deficit Skin- warm and dry, old surgical scar on back, has a picc line in right arm- site clean and dry Psychiatry- alert and oriented to person, place and time, normal mood and affect   Labs reviewed: Basic Metabolic Panel:  Recent Labs  04/02/14 0253  04/03/14 0045 04/04/14 0309  04/07/14 0258  04/11/14 0450 04/14/14 0540 04/15/14 0450  NA 132*  --  132* 138  < > 140  < > 142 140 141  K 3.4*  --  3.7 3.2*  < > 4.0  < > 3.7 3.3* 3.7  CL 97  --  99 101  < > 104  < > 101 99 100  CO2 20  --  20 24  < > 28  < > 30 32 31  GLUCOSE 107*  --  137* 121*  < > 111*  < > 105* 104* 115*  BUN 25*  --  23 20  < > 19  < > 14 17 16   CREATININE 0.72  --  0.76 0.65  < > 0.65  < > 0.58 0.62 0.69  CALCIUM 7.7*  --  7.7* 7.8*  < > 8.3*  < > 8.5 8.9 8.6  MG  --   < > 2.2 2.1  --  2.2  --  2.0  --   --   PHOS  --   --  1.6* 2.0*  --   --   --   --   --   --   < > = values in this interval not displayed. Liver Function Tests:  Recent Labs  04/06/14 0302 04/07/14 0258 04/11/14 0450  AST 41* 40* 27  ALT 42 39 26  ALKPHOS 96 117 101  BILITOT 0.8 0.9 0.6  PROT 5.6* 5.7* 6.2  ALBUMIN 2.2* 2.3* 2.4*   No results found for  this basename: LIPASE, AMYLASE,  in the last 8760 hours No results found for this basename: AMMONIA,  in the last 8760 hours CBC:  Recent Labs  04/02/14 0253  04/07/14 0258 04/08/14 0426 04/11/14 0450 04/14/14 0540  WBC 14.4*  < > 15.7* 13.7* 12.2* 11.0*  NEUTROABS 11.8*  --  13.6*  --  10.3*  --   HGB 16.9  < > 16.1 16.2 16.1 15.2  HCT 47.3  < > 46.6 46.9 47.6 45.3  MCV 90.8  < > 91.9 94.2 96.9 95.2  PLT 98*  < > 339 347 384 432*  < > = values in this interval not displayed. Cardiac Enzymes:  Recent Labs  04/01/14 2018 04/02/14 0200 04/02/14 0752  04/02/14 1420 04/03/14 0045 04/04/14 0309  CKTOTAL 7232* 4522*  --   < > 1550* 1045* 270*  TROPONINI <0.30 <0.30 <0.30  --   --   --   --   < > = values in this interval not displayed.  Radiological Exams: Dg Chest 2 View  04/01/2014   CLINICAL DATA:  Shortness of breath.  Cough.  EXAM: CHEST  2 VIEW  COMPARISON:  CT scan dated 08/23/2005  FINDINGS: Heart size and pulmonary vascularity are normal. Lungs are clear. No acute osseous abnormality. Prominent right first costochondral junction.  IMPRESSION: No active cardiopulmonary disease.   Electronically Signed   By: Rozetta Nunnery M.D.   On: 04/01/2014 17:25   Dg Lumbar Spine Complete  04/01/2014   CLINICAL DATA:  Low back pain.  EXAM: LUMBAR SPINE - COMPLETE 4+ VIEW  COMPARISON:  CT abdomen and pelvis 01/25/2013  FINDINGS: Mild lumbar levoscoliosis his again seen with severe asymmetric right-sided disc space narrowing at L2-3 and L3-4 with associated vertebral body sclerosis and bridging osteophytes. 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 Trace retrolisthesis of L2 on L3 is unchanged and likely degenerative. There is no evidence of compression fracture. Atherosclerotic aortic calcification is noted.  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 No acute osseous abnormality identified.   Electronically Signed   By: Logan Bores   On:  04/01/2014 17:27   Mr Thoracic Spine W Wo Contrast  04/06/2014   CLINICAL DATA:  Bacteremia and back pain.  EXAM: MRI THORACIC AND LUMBAR SPINE WITHOUT AND WITH CONTRAST  TECHNIQUE: Multiplanar and multiecho pulse sequences of the thoracic and lumbar spine were obtained without and with intravenous contrast.  CONTRAST:  71mL MULTIHANCE GADOBENATE DIMEGLUMINE 529 MG/ML IV SOLN  COMPARISON:  CT abdomen and pelvis 04/01/2014. Lumbar spine radiographs 04/01/2014. Chest CT 08/23/2005.  FINDINGS: MR THORACIC SPINE FINDINGS  Vertebral alignment is within normal limits. No compression fracture is identified. Scattered, small Schmorl's nodes are noted in the mid and lower thoracic spine. Mild degenerative endplate changes are present in the mid and lower thoracic spine. No vertebral marrow edema is identified, although evaluation is limited by low signal through the lower thoracic spine. No abnormal enhancement is identified. Mild subcutaneous soft tissue edema is noted, predominantly in the lower back.  Axial images are degraded by motion artifact. Spinal cord is normal in caliber and grossly normal in signal. Small right central disc protrusion at T4-5 and small central disc protrusion at T9-10 do not result in stenosis. More diffuse disc bulging at T10-11 minimally effaces the ventral thecal sac without significant spinal canal stenosis. There are small bilateral pleural effusions. Abnormal signal is present in the right greater than left lower lobes of the lungs.  MR LUMBAR SPINE FINDINGS  Examination is severely limited by motion artifact and low signal to noise.  There is straightening of the normal lumbar lordosis. Mild lumbar levoscoliosis is present. There is trace retrolisthesis of L2 on L3, unchanged. Moderate to severe disc space narrowing is again seen from L2-3 to L5-S1. There is prominent T2/ STIR hyperintensity  in the disc spaces at each of these 4 levels. There is also evidence of marrow edema and  enhancement in the right aspects of the vertebral bodies adjacent to the L2-3 disc space and there is asymmetric fluid in the right L2-3 facet joint. There is also likely paravertebral soft tissue edema and enhancement to the right of the L2 and L3 vertebral bodies with some involvement of the psoas muscle.  There is mild marrow edema and enhancement in the vertebral bodies adjacent to the L4-5 disc space, greater left of midline. There is abnormal T2 hyperintense, heterogeneously enhancing material in the ventral epidural space behind the vertebral bodies extending from the L3-4 disc space level to the S1 vertebral body level, and this may communicate with the L4-5 disc space. This contributes to moderate to severe spinal stenosis at and just cranial to the L4-5 disc space. Prior left hemilaminectomies are identified at L4-5 and L5-S1.  Asymmetric right facet arthrosis and small facet joint effusions are also noted at L1-2 and L3-4. 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 Conus medullaris terminates at T12-L1.  IMPRESSION: 1. Motion degraded study as above. Prominent fluid signal in the the L2-3 to L5-S1 disc spaces with marrow edema suspicious for discitis/osteomyelitis specifically at L2-3 and L4-5. Ventral epidural material extending from L4-S1 likely represents phlegmon, potentially arising from the L4-5 disc space and contributing to prominent spinal stenosis at the L4 level. 2. Facet joint fluid at L1-2, L2-3, and L3-4 may reflect advanced chronic facet arthritis although septic arthritis cannot be excluded, particularly at L2-3. 3. No evidence of infection in the thoracic spine. 4. Small bilateral pleural effusions and abnormal signal in the lower lobes. Evaluation is limited by motion, however pneumonia is a consideration in addition to atelectasis. Repeat chest radiographs are recommended. These results were called by  telephone at the time of interpretation on 04/06/2014 at 5:09 pm to Dr. Sherral Hammers, who verbally acknowledged these results.   Electronically Signed   By: Logan Bores   On: 04/06/2014 17:11   Mr Lumbar Spine W Wo Contrast  04/06/2014   CLINICAL DATA:  Bacteremia and back pain.  EXAM: MRI THORACIC AND LUMBAR SPINE WITHOUT AND WITH CONTRAST  TECHNIQUE: Multiplanar and multiecho pulse sequences of the thoracic and lumbar spine were obtained without and with intravenous contrast.  CONTRAST:  35mL MULTIHANCE GADOBENATE DIMEGLUMINE 529 MG/ML IV SOLN  COMPARISON:  CT abdomen and pelvis 04/01/2014. Lumbar spine radiographs 04/01/2014. Chest CT 08/23/2005.  FINDINGS: MR THORACIC SPINE FINDINGS  Vertebral alignment is within normal limits. No compression fracture is identified. Scattered, small Schmorl's nodes are noted in the mid and lower thoracic spine. Mild degenerative endplate changes are present in the mid and lower thoracic spine. No vertebral marrow edema is identified, although evaluation is limited by low signal through the lower thoracic spine. No abnormal enhancement is identified. Mild subcutaneous soft tissue edema is noted, predominantly in the lower back.  Axial images are degraded by motion artifact. Spinal cord is normal in caliber and grossly normal in signal. Small right central disc protrusion at T4-5 and small central disc protrusion at T9-10 do not result in stenosis. More diffuse disc bulging at T10-11 minimally effaces the ventral thecal sac without significant spinal canal stenosis. There are small bilateral pleural effusions. Abnormal signal is present in the right greater than left lower lobes of the lungs.  MR LUMBAR SPINE FINDINGS  Examination is  severely limited by motion artifact and low signal to noise.  There is straightening of the normal lumbar lordosis. Mild lumbar levoscoliosis is present. There is trace retrolisthesis of L2 on L3, unchanged. Moderate to severe disc space narrowing is  again seen from L2-3 to L5-S1. There is prominent T2/ STIR hyperintensity in the disc spaces at each of these 4 levels. There is also evidence of marrow edema and enhancement in the right aspects of the vertebral bodies adjacent to the L2-3 disc space and there is asymmetric fluid in the right L2-3 facet joint. There is also likely paravertebral soft tissue edema and enhancement to the right of the L2 and L3 vertebral bodies with some involvement of the psoas muscle.  There is mild marrow edema and enhancement in the vertebral bodies adjacent to the L4-5 disc space, greater left of midline. There is abnormal T2 hyperintense, heterogeneously enhancing material in the ventral epidural space behind the vertebral bodies extending from the L3-4 disc space level to the S1 vertebral body level, and this may communicate with the L4-5 disc space. This contributes to moderate to severe spinal stenosis at and just cranial to the L4-5 disc space. Prior left hemilaminectomies are identified at L4-5 and L5-S1.  Asymmetric right facet arthrosis and small facet joint effusions are also noted at L1-2 and L3-4. 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 Conus medullaris terminates at T12-L1.  IMPRESSION: 1. Motion degraded study as above. Prominent fluid signal in the the L2-3 to L5-S1 disc spaces with marrow edema suspicious for discitis/osteomyelitis specifically at L2-3 and L4-5. Ventral epidural material extending from L4-S1 likely represents phlegmon, potentially arising from the L4-5 disc space and contributing to prominent spinal stenosis at the L4 level. 2. Facet joint fluid at L1-2, L2-3, and L3-4 may reflect advanced chronic facet arthritis although septic arthritis cannot be excluded, particularly at L2-3. 3. No evidence of infection in the thoracic spine. 4. Small bilateral pleural effusions and abnormal signal in the lower lobes. Evaluation is  limited by motion, however pneumonia is a consideration in addition to atelectasis. Repeat chest radiographs are recommended. These results were called by telephone at the time of interpretation on 04/06/2014 at 5:09 pm to Dr. Sherral Hammers, who verbally acknowledged these results.   Electronically Signed   By: Logan Bores   On: 04/06/2014 17:11   Ct Abdomen Pelvis W Contrast  04/01/2014   CLINICAL DATA:  Lower back pain and generalized weakness. Vomiting and anorexia.  EXAM: CT ABDOMEN AND PELVIS WITH CONTRAST  TECHNIQUE: Multidetector CT imaging of the abdomen and pelvis was performed using the standard protocol following bolus administration of intravenous contrast.  CONTRAST:  161mL OMNIPAQUE IOHEXOL 300 MG/ML  SOLN  COMPARISON:  CT of the abdomen and pelvis from 01/25/2013  FINDINGS: Minimal bibasilar atelectasis is noted. Scattered coronary artery calcifications are seen.  A stable 2.6 cm hypodensity within the left hepatic lobe likely reflects a cyst. The liver and spleen are otherwise unremarkable. The gallbladder is borderline normal in size, and grossly unremarkable in appearance. The pancreas and adrenal glands are unremarkable.  Diffuse perinephric stranding is noted. As this is new from the prior study, it raises concern for mild diffuse bilateral pyelonephritis.  A 9 mm cyst is noted at the upper pole of the right kidney. Scattered vascular calcifications are noted at the right renal hilum. The kidneys are otherwise unremarkable. There is no evidence of hydronephrosis. No  renal or ureteral stones are identified.  Trace free fluid is seen tracking along the mid to distal left ureter, at the left lower quadrant.  The small bowel is unremarkable in appearance. The stomach is within normal limits. No acute vascular abnormalities are seen.  Indication is status post appendectomy. Contrast progresses to the level of the sigmoid colon. The colon is unremarkable in appearance.  The bladder is mildly distended and  grossly unremarkable. The prostate is mildly enlarged, measuring 4.9 cm in transverse dimension. No inguinal lymphadenopathy is seen.  No acute osseous abnormalities are identified. Multilevel vacuum phenomenon, endplate sclerotic change and disc space narrowing are noted along the lumbar spine.  IMPRESSION: 1. Diffuse perinephric stranding, new from the prior study. This raises concern for mild diffuse bilateral pyelonephritis. Would correlate for associated symptoms. 2. Small right renal cyst and likely hepatic cyst. 3. Scattered coronary artery calcifications seen. 4. Mildly enlarged prostate. 5. Mild degenerative change noted along the lumbar spine.   Electronically Signed   By: Garald Balding M.D.   On: 04/01/2014 21:50   Dg Chest Port 1 View  04/11/2014   CLINICAL DATA:  Increased work of breathing, shortness of breath, history smoking  EXAM: PORTABLE CHEST - 1 VIEW  COMPARISON:  04/01/2014  FINDINGS: Normal heart size, mediastinal contours, and pulmonary vascularity.  Atelectasis versus infiltrate at medial RIGHT lower lobe.  Remaining lungs clear.  No pleural effusion or pneumothorax.  Bones unremarkable.  IMPRESSION: Atelectasis versus infiltrate at medial RIGHT lower lobe.   Electronically Signed   By: Lavonia Dana M.D.   On: 04/11/2014 13:11    Assessment/Plan  Physical deconditioning Will have him work with physical therapy and occupational therapy team to help with gait training and muscle strengthening exercises.fall precautions. Skin care. Encourage to be out of bed.   Dyspnea His smoking history, recent deconditioning could both be contributing to this. On review of cxr from hospital 04/11/14 ? Infiltrate in RLL. Has poor air entry with chest congestion on exam concern for some aspiration given his poor dentition. On ceftriaxone. Add mucinex prn to help loosen his mucus. Get a repeat cxr to assess for worsening of infiltrate and need to broaden his antibiotics  Osteomyelitis Continue  ceftriaxone for a total of 6 weeks. picc line care. Check cbc with diff and cmp. Has a repeat mri in 6 weeks and f/u with dr hatcher from ID  afib Rate currently controlled.continue diltiazem and amiodarone 200 mg bid with eliquis for anticoagulation  Back pain With his hx of back surgeries and recent osteomyelitis. Pain not under control. Will change his oxycodone to 15 mg bid and add oxycodone-apap 5-325 q6h prn pain. Add robaxin 500 mg q6h prn for muscle spasm. D/c tramadol  HTN Elevated bp this am but his pain is also contributing to it. Continue current regimen of losartan 50 daily and hydralazine 25 qid and monitor clinically. Starting him on lasix 20 mg daily and this should help some with his BP  Loose stool Possible antibiotic related. D/c senna s bid dosing and change to daily prn if no bowel movement in more than 2 days. Add florastor but monitor closely with him on antibiotics for c.diff  Leg edema Start lasix 20 mg po daily for leg edema, keep legs elevated at rest, bmp next week  Family/ staff Communication: reviewed care plan with patient and nursing supervisor   Goals of care: short term rehabilitation   Labs/tests ordered: cbc with diff, cmp, cxr  Blanchie Serve, MD  [facility] [phone] (Monday-Friday 8 am - 5 pm) [phone] (afterhours)

## 2014-04-19 ENCOUNTER — Other Ambulatory Visit: Payer: Self-pay | Admitting: *Deleted

## 2014-04-19 MED ORDER — OXYCODONE HCL ER 15 MG PO T12A: EXTENDED_RELEASE_TABLET | ORAL | Status: DC

## 2014-04-19 MED ORDER — OXYCODONE-ACETAMINOPHEN 5-325 MG PO TABS
ORAL_TABLET | ORAL | Status: DC
Start: 2014-04-19 — End: 2014-06-24

## 2014-04-19 NOTE — Telephone Encounter (Signed)
[facility] 

## 2014-05-05 LAB — BASIC METABOLIC PANEL
BUN: 17 mg/dL (ref 4–21)
Creatinine: 0.8 mg/dL (ref 0.6–1.3)
Glucose: 89 mg/dL
Potassium: 4.4 mmol/L (ref 3.4–5.3)
SODIUM: 139 mmol/L (ref 137–147)

## 2014-05-05 LAB — HEPATIC FUNCTION PANEL
ALT: 28 U/L (ref 10–40)
AST: 23 U/L (ref 14–40)
Alkaline Phosphatase: 107 U/L (ref 25–125)

## 2014-05-05 LAB — CBC AND DIFFERENTIAL
HCT: 43 % (ref 41–53)
Hemoglobin: 14.4 g/dL (ref 13.5–17.5)
Platelets: 276 10*3/uL (ref 150–399)
WBC: 8.7 10^3/mL

## 2014-05-06 ENCOUNTER — Other Ambulatory Visit: Payer: Self-pay | Admitting: Internal Medicine

## 2014-05-06 DIAGNOSIS — M869 Osteomyelitis, unspecified: Secondary | ICD-10-CM

## 2014-05-11 ENCOUNTER — Non-Acute Institutional Stay (SKILLED_NURSING_FACILITY): Payer: Medicare HMO | Admitting: Internal Medicine

## 2014-05-11 DIAGNOSIS — M7989 Other specified soft tissue disorders: Secondary | ICD-10-CM

## 2014-05-11 DIAGNOSIS — M869 Osteomyelitis, unspecified: Secondary | ICD-10-CM

## 2014-05-11 DIAGNOSIS — R5381 Other malaise: Secondary | ICD-10-CM

## 2014-05-11 DIAGNOSIS — G8929 Other chronic pain: Secondary | ICD-10-CM

## 2014-05-11 DIAGNOSIS — M549 Dorsalgia, unspecified: Secondary | ICD-10-CM

## 2014-05-11 DIAGNOSIS — I480 Paroxysmal atrial fibrillation: Secondary | ICD-10-CM

## 2014-05-11 DIAGNOSIS — I10 Essential (primary) hypertension: Secondary | ICD-10-CM

## 2014-05-11 NOTE — Progress Notes (Signed)
Patient ID: [name], male   DOB: 25-Jan-1941, 73 y.o.   MRN: [id number]   Place of Service: [facility] No Known Allergies  Code Status: Full Code  Goals of Care: Longevity/Short term Rehab  Chief Complaint  Patient presents with   Discharge Note    HPI 73 y.o. male with PMH of HTN, paroxysmal afib, osteomyelitis, back pain among others is being seen for a discharge visit. Patient was here for short-term rehabilitation for physical deconditioning and IV abx therapy for treatment of osteomyelitis via PICC line. He has worked well with therapy team. No complaints verbalized patient. No concern reported from staff.   Review of Systems Constitutional: Negative for fever, chills, and fatigue. HENT: Negative for facial swelling, ear pain, congestion, and sore throat Eyes: Negative for eye pain, eye discharge, and visual disturbance  Cardiovascular: Negative for chest pain, palpitations. Positive for leg swelling  Respiratory: Negative cough, shortness of breath, and wheezing.  Gastrointestinal: Negative for nausea and vomiting. Negative for abdominal pain, diarrhea and constipation.  Genitourinary: Negative for  dysuria, frequency, urgency, and hematuria Musculoskeletal: Positive for back pain Neurological: Negative for dizziness, headache, weakness, and tremors.  Skin: Negative for rash  Psychiatric: Negative for nervous/anxious, agitation, depression, and suicidal ideas.   Past Medical History  Diagnosis Date   Acute appendicitis with peritoneal abscess 01/28/2013   Ileus, postoperative 01/28/2013   Tobacco use disorder 01/28/2013   Obesity, morbid 01/28/2013   Shortness of breath    Essential hypertension 04/16/2014    Past Surgical History  Procedure Laterality Date   Joint replacement      bilateral knee surgery   Back surgery     Laparoscopic appendectomy N/A 01/25/2013    Procedure: APPENDECTOMY LAPAROSCOPIC;  Surgeon: Ralene Ok, MD;   Location: [facility];  Service: General;  Laterality: N/A;   Tee without cardioversion N/A 04/07/2014    Procedure: TRANSESOPHAGEAL ECHOCARDIOGRAM (TEE);  Surgeon: Candee Furbish, MD;  Location: [facility] ENDOSCOPY;  Service: Cardiovascular;  Laterality: N/A;    History   Social History   Marital Status: Single    Spouse Name: N/A    Number of Children: N/A   Years of Education: N/A   Occupational History   Not on file.   Social History Main Topics   Smoking status: Current Every Day Smoker -- 1.50 packs/day for 50 years    Types: Cigarettes   Smokeless tobacco: Not on file   Alcohol Use: No   Drug Use: No   Sexual Activity: Not Currently   Other Topics Concern   Not on file   Social History Narrative   No narrative on file      Medication List       This list is accurate as of: 05/11/14 11:06 AM.  Always use your most recent med list.               amiodarone 200 MG tablet  Commonly known as:  PACERONE  Take 1 tablet (200 mg total) by mouth 2 (two) times daily.     apixaban 5 MG Tabs tablet  Commonly known as:  ELIQUIS  Take 1 tablet (5 mg total) by mouth 2 (two) times daily.     hydrALAZINE 25 MG tablet  Commonly known as:  APRESOLINE  Take 1 tablet (25 mg total) by mouth every 6 (six) hours.     losartan 50 MG tablet  Commonly known as:  COZAAR  Take 1 tablet (50 mg total) by mouth daily.  methocarbamol 500 MG tablet  Commonly known as:  ROBAXIN  Take 1 tablet (500 mg total) by mouth 3 (three) times daily.     OxyCODONE 10 mg T12a 12 hr tablet  Commonly known as:  OXYCONTIN  Take 1 tablet (10 mg total) by mouth every 12 (twelve) hours.     OxyCODONE 15 mg T12a 12 hr tablet  Commonly known as:  OXYCONTIN  Take one tablet by mouth twice daily for pain     oxyCODONE-acetaminophen 5-325 MG per tablet  Commonly known as:  ROXICET  Take one tablet by mouth every 6 hours as needed for pain     senna-docusate 8.6-50 MG per tablet  Commonly known as:   Senokot-S  Take 1 tablet by mouth daily as needed.        Physical Exam Filed Vitals:   05/11/14 1026  BP: 122/66  Pulse: 68  Temp: 97.3 F (36.3 C)  Resp: 16   Constitutional: WDWN elderly male in no acute distress.  HEENT: Normocephalic and atraumatic. PERRL. EOM intact. No icterus. No nasal discharge or sinus tenderness. Oral mucosa moist. Posterior pharynx clear of any exudate or lesions.  Neck: Supple and nontender. No lymphadenopathy, masses, or thyromegaly. No JVD or carotid bruits. Cardiac: Normal S1, S2. RRR without appreciable murmurs, rubs, or gallops. Intact pulses intact. Trace pitting dependent edema bilaterally.  Lungs: No respiratory distress. Breath sounds clear bilaterally without rales, rhonchi, or wheezes. Abdomen: Audible bowel sounds in all quadrants. Soft, nontender, nondistended. No palpable mass.  Musculoskeletal: Able to move all extremities. Normal strength bilaterally throughout. Spine and Back: Normal spinal profile. No scoliosis or kyphosis. No CVA tenderness.  Skin: Warm and dry. No rash noted. Small skin tear noted on R knee. R PICC line intact. Neurological: Alert and oriented to person, place, and time. No focal deficits.  Psychiatric: Judgment and insight adequate. Appropriate mood and affect.   Labs Reviewed CBC Latest Ref Rng 05/05/2014 04/14/2014 04/11/2014  WBC - 8.7 11.0(H) 12.2(H)  Hemoglobin 13.5 - 17.5 g/dL 14.4 15.2 16.1  Hematocrit 41 - 53 % 43 45.3 47.6  Platelets 150 - 399 K/L 276 432(H) 384      Chemistry      Component Value Date/Time   NA 139 05/05/2014   NA 141 04/15/2014 0450   K 4.4 05/05/2014   CL 100 04/15/2014 0450   CO2 31 04/15/2014 0450   BUN 17 05/05/2014   BUN 16 04/15/2014 0450   CREATININE 0.8 05/05/2014   CREATININE 0.69 04/15/2014 0450   GLU 89 05/05/2014      Component Value Date/Time   CALCIUM 8.6 04/15/2014 0450   ALKPHOS 107 05/05/2014   AST 23 05/05/2014   ALT 28 05/05/2014   BILITOT 0.6 04/11/2014  0450      Assessment & Plan 1. Physical deconditioning Improved. Works well with PT. Will d/c home with home PT and rolling walker.   2. Essential hypertension Stable. Continue losartan 50mg  daily, hydralazine 25mg  three times daily, lasix 20mg  daily.  3. Leg swelling Improved. Only trace pitting leg edema noted on exam. Will continue lasix 20mg  daily and potassium supplement.  4. Osteomyelitis Received last dose of 2g Rocephin IV today. Have appt to  f/u with ID, Dr. Wilhemina Cash 05/12/14. Will have his PICC line d/c then.   5. Paroxysmal atrial fibrillation Currently NSR. Continue amiodarone 200mg  twice daily and eliquis 5mg  twice daily. PCP to check thyroid panel and LFTs in 5 months.  6. Chronic back pain  Ongoing. Continue percocet 5/325mg  Q6H as needed, oxycontin 15mg  twice daily, and robaxin 500mg  Q6H as needed.   Health Maintenance Patient will need Smoking cessation counseling and Colonoscopy screening    Home health services: PT DME required: rolling walker PCP follow-up: 30-day supply of prescription medications provided  Family/Staff Communication Plan of care discuss with patient and professional staff members. Patient and professional staff members verbalize understanding and agree with plan of care. No additional questions or concerns reported.    Arthur Holms, MSN, AGNP-C [facility] [address] [place], [place] [address] [phone] [8am-5pm] After hours: [phone]

## 2014-05-12 ENCOUNTER — Telehealth: Payer: Self-pay | Admitting: *Deleted

## 2014-05-12 ENCOUNTER — Encounter: Payer: Self-pay | Admitting: Internal Medicine

## 2014-05-12 ENCOUNTER — Ambulatory Visit: Payer: Medicare HMO | Admitting: Infectious Diseases

## 2014-05-12 NOTE — Telephone Encounter (Signed)
Verbal order per Dr. Johnnye Sima given to Amalia Hailey at [facility] to remove patient's picc line ASAP and to draw two blood cultures on patient. Also reminded of upcoming MRI appt for 05/19/14 at [facility]. Myrtis Hopping

## 2014-05-13 ENCOUNTER — Ambulatory Visit: Payer: Medicare HMO | Admitting: Infectious Diseases

## 2014-05-19 ENCOUNTER — Ambulatory Visit (HOSPITAL_COMMUNITY)
Admission: RE | Admit: 2014-05-19 | Discharge: 2014-05-19 | Disposition: A | Discharge status: Home / Self Care | Payer: Medicare HMO | Source: Ambulatory Visit | Attending: Internal Medicine | Admitting: Internal Medicine

## 2014-05-19 DIAGNOSIS — M869 Osteomyelitis, unspecified: Secondary | ICD-10-CM

## 2014-05-19 MED ORDER — GADOBENATE DIMEGLUMINE 529 MG/ML IV SOLN
20.0000 mL | Freq: Once | INTRAVENOUS | Status: AC | PRN
  Administered 2014-05-19: 20 mL via INTRAVENOUS

## 2014-05-20 ENCOUNTER — Telehealth: Payer: Self-pay | Admitting: Licensed Clinical Social Worker

## 2014-05-20 NOTE — Telephone Encounter (Signed)
Patient had a MRI done on 11/4, per Dr. Hollace Kinnier patient need to be seen sooner in our clinic if possible due to worsening results. Patient has an appointment on 11/23 with Dr. Johnnye Sima. Please advise

## 2014-05-28 NOTE — Telephone Encounter (Signed)
Please get in asap thanks

## 2014-05-31 ENCOUNTER — Encounter: Payer: Self-pay | Admitting: Infectious Diseases

## 2014-05-31 ENCOUNTER — Ambulatory Visit (INDEPENDENT_AMBULATORY_CARE_PROVIDER_SITE_OTHER): Payer: Medicare HMO | Admitting: Infectious Diseases

## 2014-05-31 VITALS — BP 141/70 | HR 94 | Temp 98.1°F | Wt 200.2 lb

## 2014-05-31 DIAGNOSIS — M869 Osteomyelitis, unspecified: Secondary | ICD-10-CM

## 2014-05-31 MED ORDER — AMOXICILLIN-POT CLAVULANATE 875-125 MG PO TABS: 1.0000 | ORAL_TABLET | Freq: Two times a day (BID) | ORAL | Status: DC

## 2014-05-31 MED ORDER — LEVOFLOXACIN 750 MG PO TABS: 750.0000 mg | ORAL_TABLET | Freq: Every day | ORAL | Status: DC

## 2014-05-31 NOTE — Progress Notes (Signed)
   Subjective:    Patient ID: [name], male    DOB: [birth year], 73 y.o.   MRN: [id number]  HPI 73 yo M with hx of obesity, previous back surgery, adm [facility] on [facility]17 with 3 days of n/v, diarrhea. He fell out of bed and was unable to get up and was down for several hours (? 20h). He was felt to have sepsis, dehydration (HR 140s, WBC 16.6, CK >12,000, and AST 254/ALT 89). He was also found to have afib with RVR. He was started on cipro/flagyl for GU/GI coverage. This was changed to zosyn within 24h due to CT showing stranding around kidney/concern for pyelo. He was then noted to have GPC bacteremia. Was started on vanco on 9-18. Cx resulted a group B strep. His anbx were changed to ceftriaxone on 9-20.He was d/c to SNF on 10-1 with plan for 6 weeks of ceftriaxone. He was d/c home ~ 2 weeks ago.  He had f/u MRI on 11-4 showing: 1. 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 There is paravertebral soft tissue enhancement likely representing a phlegmon extending into the right iliopsoas muscle. There is progressive disc destruction particularly at L2-3 and L4-5, worsened marrow edema and enhancement most concerning for worsening. There is no drainable fluid collection. 2. There is a small amount of fluid and enhancement involving the right facet joints at L1-2, L2-3 and L3-4 which may be secondary to advanced facet arthropathy versus septic arthritis. No significant interval change compared with the prior exam. TEE (-) 04-07-14.   He still has back pain today, mostly when he is moving, getting in and out of chair/bed. Worse with longer he is sitting. Walking with minimal discomfort. No fever or chills.  Got flu/pnvx at SNF  Review of Systems  Constitutional: Negative for fever, chills, appetite change and unexpected weight change.  Gastrointestinal: Negative for diarrhea and constipation.  Genitourinary: Negative for difficulty urinating.   Musculoskeletal: Positive for back pain.  has gained 3#, although lost ~ 20 prior.      Objective:   Physical Exam  Constitutional: He appears well-developed and well-nourished.  HENT:  Mouth/Throat: No oropharyngeal exudate.  Eyes: EOM are normal. Pupils are equal, round, and reactive to light.  Neck: Neck supple.  Cardiovascular: Normal rate, regular rhythm and normal heart sounds.   Pulmonary/Chest: Effort normal and breath sounds normal.  Abdominal: Soft. Bowel sounds are normal. There is no tenderness.  Musculoskeletal:       Arms: Lymphadenopathy:    He has no cervical adenopathy.  Neurological: He is alert. He has normal strength. No sensory deficit.          Assessment & Plan:

## 2014-05-31 NOTE — Assessment & Plan Note (Addendum)
He appears stable, even better clinically. However his MRI is concerning. Discussed po vs IV therapy options with pt. Will start him on augmentin (flouroquinolones will interact with his amio).  Will have him seen by neurosurgery.  He needs to f/u with his PCP regarding his multiple comorbid illnesses (he wants to come off his rx's).  rtc 3-4 weeks.

## 2014-06-07 ENCOUNTER — Inpatient Hospital Stay: Payer: Medicare HMO | Admitting: Infectious Diseases

## 2014-06-14 ENCOUNTER — Other Ambulatory Visit: Payer: Self-pay | Admitting: *Deleted

## 2014-06-14 ENCOUNTER — Other Ambulatory Visit: Payer: Self-pay | Admitting: Internal Medicine

## 2014-06-14 MED ORDER — FUROSEMIDE 20 MG PO TABS: ORAL_TABLET | ORAL | Status: DC

## 2014-06-14 MED ORDER — LOSARTAN POTASSIUM 50 MG PO TABS: ORAL_TABLET | ORAL | Status: DC

## 2014-06-14 MED ORDER — POTASSIUM CHLORIDE CRYS ER 20 MEQ PO TBCR: 20.0000 meq | EXTENDED_RELEASE_TABLET | Freq: Every day | ORAL | Status: DC

## 2014-06-14 MED ORDER — AMIODARONE HCL 200 MG PO TABS: 200.0000 mg | ORAL_TABLET | Freq: Two times a day (BID) | ORAL | Status: DC

## 2014-06-14 MED ORDER — APIXABAN 5 MG PO TABS: 5.0000 mg | ORAL_TABLET | Freq: Two times a day (BID) | ORAL | Status: DC

## 2014-06-14 NOTE — Telephone Encounter (Signed)
Patient called and stated that he needed refills on his medications until Dr. Mariea Clonts can see him on the 10th of December. Oked  By Dr. Mariea Clonts to refill medications all but pain meds. Refills sent to pharmacy.

## 2014-06-24 ENCOUNTER — Ambulatory Visit (INDEPENDENT_AMBULATORY_CARE_PROVIDER_SITE_OTHER): Payer: Medicare HMO | Admitting: Nurse Practitioner

## 2014-06-24 ENCOUNTER — Encounter: Payer: Self-pay | Admitting: Nurse Practitioner

## 2014-06-24 VITALS — BP 148/72 | HR 90 | Temp 99.2°F | Resp 10 | Ht 66.0 in | Wt 207.0 lb

## 2014-06-24 DIAGNOSIS — R739 Hyperglycemia, unspecified: Secondary | ICD-10-CM

## 2014-06-24 DIAGNOSIS — M869 Osteomyelitis, unspecified: Secondary | ICD-10-CM

## 2014-06-24 DIAGNOSIS — I480 Paroxysmal atrial fibrillation: Secondary | ICD-10-CM

## 2014-06-24 DIAGNOSIS — I10 Essential (primary) hypertension: Secondary | ICD-10-CM

## 2014-06-24 MED ORDER — HYDRALAZINE HCL 25 MG PO TABS: 25.0000 mg | ORAL_TABLET | Freq: Four times a day (QID) | ORAL | Status: DC

## 2014-06-24 MED ORDER — AMIODARONE HCL 200 MG PO TABS: 200.0000 mg | ORAL_TABLET | Freq: Two times a day (BID) | ORAL | Status: DC

## 2014-06-24 MED ORDER — HYDRALAZINE HCL 25 MG PO TABS: 25.0000 mg | ORAL_TABLET | Freq: Three times a day (TID) | ORAL | Status: DC

## 2014-06-24 MED ORDER — LOSARTAN POTASSIUM 50 MG PO TABS: ORAL_TABLET | ORAL | Status: DC

## 2014-06-24 MED ORDER — APIXABAN 5 MG PO TABS: 5.0000 mg | ORAL_TABLET | Freq: Two times a day (BID) | ORAL | Status: DC

## 2014-06-24 MED ORDER — TETANUS-DIPHTH-ACELL PERTUSSIS 5-2.5-18.5 LF-MCG/0.5 IM SUSP: 0.5000 mL | Freq: Once | INTRAMUSCULAR | Status: DC

## 2014-06-24 NOTE — Addendum Note (Signed)
Addended by: [name] on: 06/24/2014 01:49 PM   Modules accepted: Level of Service

## 2014-06-24 NOTE — Progress Notes (Signed)
Patient ID: [name], [name]   DOB: 21-Apr-1941, 73 y.o.   MRN: [id number]    PCP: [name], MD  No Known Allergies  Chief Complaint  Patient presents with  . Establish Care    New Patient establish care: dicuss medications-? if patient needs to continue all medication     HPI: Patient is a 73 y.o. [name] seen in the office today to establish care, pt has not had PCP in 3-4 years. Previously was seeing Dr Huey Bienenstock.  Pt following with ID due to osteomyelitis, on continuous Augmentin. Last seen by Dr Johnnye Sima 05/31/14 has follow up with him next week. Never saw neurosurgeon.  Reports nurse called to set up an appt with neurosurgery and she never called back and he never called her back. Reports pain is better.  Reports he has no problems, when he was infected in his back this caused all of his problems.  Feels like since he is better and pain has improved his other co-morbities should be better.   Taking hydralazine TID - (not every 6 hours throughout a 24 hour period) Review of Systems:  Review of Systems  Constitutional: Negative for activity change, appetite change, fatigue and unexpected weight change.  HENT: Positive for dental problem (dentures) and hearing loss (wears hearing aides). Negative for congestion.   Eyes: Negative.   Respiratory: Negative for cough, shortness of breath and wheezing.   Cardiovascular: Negative for chest pain, palpitations and leg swelling.  Gastrointestinal: Negative for abdominal pain, diarrhea and constipation.  Genitourinary: Positive for frequency (drinks a lot of coffee). Negative for dysuria and difficulty urinating.       Getting up 1-3 times at night to urinate, overall improving   Musculoskeletal: Positive for myalgias, arthralgias and gait problem.       Pains in back related to infection  Skin: Negative for color change and wound.  Neurological: Positive for weakness. Negative for dizziness.       Weakness due to osteomyelitis, overall  getting stronger   Psychiatric/Behavioral: Negative for behavioral problems, confusion and agitation.    Past Medical History  Diagnosis Date  . Acute appendicitis with peritoneal abscess 01/28/2013  . Ileus, postoperative 01/28/2013  . Tobacco use disorder 01/28/2013  . Obesity, morbid 01/28/2013  . Shortness of breath   . Essential hypertension 04/16/2014   Past Surgical History  Procedure Laterality Date  . Joint replacement      bilateral knee surgery  . Back surgery  1997, 2003    Dr.Apleton   . Laparoscopic appendectomy N/A 01/25/2013    Procedure: APPENDECTOMY LAPAROSCOPIC;  Surgeon: Ralene Ok, MD;  Location: [facility];  Service: General;  Laterality: N/A;  . Tee without cardioversion N/A 04/07/2014    Procedure: TRANSESOPHAGEAL ECHOCARDIOGRAM (TEE);  Surgeon: Candee Furbish, MD;  Location: [facility];  Service: Cardiovascular;  Laterality: N/A;  . Shoulder surgery Right 2014    Dr.Whitfield    Social History:   reports that he has been smoking Cigarettes.  He has a 50 pack-year smoking history. He does not have any smokeless tobacco history on file. He reports that he does not drink alcohol or use illicit drugs.  No family history on file.  Medications: Patient's Medications  New Prescriptions   No medications on file  Previous Medications   AMIODARONE (PACERONE) 200 MG TABLET    Take 1 tablet (200 mg total) by mouth 2 (two) times daily.   AMOXICILLIN-CLAVULANATE (AUGMENTIN) 875-125 MG PER TABLET    Take 1 tablet  by mouth 2 (two) times daily.   APIXABAN (ELIQUIS) 5 MG TABS TABLET    Take 1 tablet (5 mg total) by mouth 2 (two) times daily.   FUROSEMIDE (LASIX) 20 MG TABLET    Take one tablet by mouth once daily for swelling   HYDRALAZINE (APRESOLINE) 25 MG TABLET    Take 1 tablet (25 mg total) by mouth every 6 (six) hours.   LOSARTAN (COZAAR) 50 MG TABLET    Take one tablet by mouth once daily for blood pressure   METHOCARBAMOL (ROBAXIN) 500 MG TABLET    Take 1 tablet (500  mg total) by mouth 3 (three) times daily.   POTASSIUM CHLORIDE SA (K-DUR,KLOR-CON) 20 MEQ TABLET    Take 1 tablet (20 mEq total) by mouth at bedtime.  Modified Medications   Modified Medication Previous Medication   TDAP (BOOSTRIX) 5-2.5-18.5 LF-MCG/0.5 INJECTION Tdap (BOOSTRIX) 5-2.5-18.5 LF-MCG/0.5 injection      Inject 0.5 mLs into the muscle once.    Inject 0.5 mLs into the muscle once.  Discontinued Medications   OXYCODONE (OXYCONTIN) 10 MG T12A 12 HR TABLET    Take 1 tablet (10 mg total) by mouth every 12 (twelve) hours.   OXYCODONE (OXYCONTIN) 15 MG T12A 12 HR TABLET    Take one tablet by mouth twice daily for pain   OXYCODONE-ACETAMINOPHEN (ROXICET) 5-325 MG PER TABLET    Take one tablet by mouth every 6 hours as needed for pain   SENNA-DOCUSATE (SENOKOT-S) 8.6-50 MG PER TABLET    Take 1 tablet by mouth daily as needed.     Physical Exam:  Filed Vitals:   06/24/14 0859  BP: 148/72  Pulse: 90  Temp: 99.2 F (37.3 C)  TempSrc: Oral  Resp: 10  Height: 5\' 6"  (1.676 m)  Weight: 207 lb (93.895 kg)  SpO2: 96%    Physical Exam  Constitutional: He is oriented to person, place, and time. He appears well-developed and well-nourished. No distress.  HENT:  Head: Normocephalic and atraumatic.  Mouth/Throat: Oropharynx is clear and moist. No oropharyngeal exudate.  Eyes: Conjunctivae and EOM are normal. Pupils are equal, round, and reactive to light.  Neck: Normal range of motion. Neck supple.  Cardiovascular: Normal rate, regular rhythm and normal heart sounds.   Pulmonary/Chest: Effort normal and breath sounds normal.  Abdominal: Soft. Bowel sounds are normal.  Musculoskeletal: He exhibits tenderness (to lumbar spine). He exhibits no edema.  Neurological: He is alert and oriented to person, place, and time.  Skin: Skin is warm and dry. He is not diaphoretic.  Psychiatric: He has a normal mood and affect.    Labs reviewed: Basic Metabolic Panel:  Recent Labs  04/01/14 2018   04/03/14 0045 04/04/14 0309  04/07/14 6045  04/11/14 0450 04/14/14 0540 04/15/14 0450 05/05/14  NA  --   < > 132* 138  < > 140  < > 142 140 141 139  K  --   < > 3.7 3.2*  < > 4.0  < > 3.7 3.3* 3.7 4.4  CL  --   < > 99 101  < > 104  < > 101 99 100  --   CO2  --   < > 20 24  < > 28  < > 30 32 31  --   GLUCOSE  --   < > 137* 121*  < > 111*  < > 105* 104* 115*  --   BUN  --   < > 23 20  < >  19  < > 14 17 16 17   CREATININE 0.80  < > 0.76 0.65  < > 0.65  < > 0.58 0.62 0.69 0.8  CALCIUM  --   < > 7.7* 7.8*  < > 8.3*  < > 8.5 8.9 8.6  --   MG  --   < > 2.2 2.1  --  2.2  --  2.0  --   --   --   PHOS  --   --  1.6* 2.0*  --   --   --   --   --   --   --   TSH 0.976  --   --   --   --   --   --   --   --   --   --   < > = values in this interval not displayed. Liver Function Tests:  Recent Labs  04/06/14 0302 04/07/14 0258 04/11/14 0450 05/05/14  AST 41* 40* 27 23  ALT 42 39 26 28  ALKPHOS 96 117 101 107  BILITOT 0.8 0.9 0.6  --   PROT 5.6* 5.7* 6.2  --   ALBUMIN 2.2* 2.3* 2.4*  --    No results for input(s): LIPASE, AMYLASE in the last 8760 hours. No results for input(s): AMMONIA in the last 8760 hours. CBC:  Recent Labs  04/02/14 0253  04/07/14 0258 04/08/14 0426 04/11/14 0450 04/14/14 0540 05/05/14  WBC 14.4*  < > 15.7* 13.7* 12.2* 11.0* 8.7  NEUTROABS 11.8*  --  13.6*  --  10.3*  --   --   HGB 16.9  < > 16.1 16.2 16.1 15.2 14.4  HCT 47.3  < > 46.6 46.9 47.6 45.3 43  MCV 90.8  < > 91.9 94.2 96.9 95.2  --   PLT 98*  < > 339 347 384 432* 276  < > = values in this interval not displayed. Lipid Panel: No results for input(s): CHOL, HDL, LDLCALC, TRIG, CHOLHDL, LDLDIRECT in the last 8760 hours. TSH:  Recent Labs  04/01/14 2018  TSH 0.976   A1C: No results found for: HGBA1C   Assessment/Plan  1. Essential hypertension -controlled at this time. Will have pt take bp at home and record and bring log to next visit  - hydrALAZINE (APRESOLINE) 25 MG tablet; Take 1  tablet (25 mg total) by mouth 3 (three) times daily.  Dispense: 120 tablet; Refill: 0 - Comprehensive metabolic panel; Future - Ambulatory referral to Cardiology  2. Osteomyelitis -conts on augmentin, following with ID at this time  3. Paroxysmal atrial fibrillation -rate controlled and in SR at this time, pt would like to stop medications associated with a fib, will defer this to cardiology.  - CBC With differential/Platelet; Future - Ambulatory referral to Cardiology  4. Hyperglycemia -screening for DM and lipids - Lipid panel; Future - Hemoglobin A1c; Future  5. Edema -has improved, will dc lasix and potassium and monitor at this time   Will follow up in 4 weeks for EV and MMSE with fasting blood work prior to appt

## 2014-06-24 NOTE — Patient Instructions (Signed)
Swelling is better - may STOP LASIX AND POTASSIUM -- if swelling starts back may restart but make sure you take lasix with potassium together   Take blood pressure after sitting for at least 3 mins and record and bring to next visit  Will get fasting blood work prior to next visit  Follow up in 1 month for a physical  Will get a referral to cardiology for evaluation of heart

## 2014-06-30 ENCOUNTER — Encounter: Payer: Self-pay | Admitting: Infectious Diseases

## 2014-06-30 ENCOUNTER — Ambulatory Visit (INDEPENDENT_AMBULATORY_CARE_PROVIDER_SITE_OTHER): Payer: Medicare HMO | Admitting: Infectious Diseases

## 2014-06-30 VITALS — BP 158/64 | HR 87 | Temp 98.0°F | Wt 207.0 lb

## 2014-06-30 DIAGNOSIS — M869 Osteomyelitis, unspecified: Secondary | ICD-10-CM

## 2014-06-30 DIAGNOSIS — Z720 Tobacco use: Secondary | ICD-10-CM

## 2014-06-30 DIAGNOSIS — F17200 Nicotine dependence, unspecified, uncomplicated: Secondary | ICD-10-CM

## 2014-06-30 LAB — C-REACTIVE PROTEIN

## 2014-06-30 MED ORDER — AMOXICILLIN-POT CLAVULANATE 875-125 MG PO TABS: 1.0000 | ORAL_TABLET | Freq: Two times a day (BID) | ORAL | Status: DC

## 2014-06-30 NOTE — Assessment & Plan Note (Signed)
Encouraged him to quit. States he has cut back.

## 2014-06-30 NOTE — Progress Notes (Signed)
   Subjective:    Patient ID: [name], male    DOB: July 20, 1940, 73 y.o.   MRN: [id number]  HPI  73 yo M with hx of obesity, previous back surgeries (last 2003), adm [facility] on [facility] with 3 days of n/v, diarrhea. He fell out of bed and was unable to get up and was down for several hours (? 20h). He was felt to have sepsis, dehydration (HR 140s, WBC 16.6, CK >12,000, and AST 254/ALT 89). He was also found to have afib with RVR. He was started on cipro/flagyl for GU/GI coverage. This was changed to zosyn within 24h due to CT showing stranding around kidney/concern for pyelo. He was then noted to have GPC bacteremia. Was started on vanco on 9-18. Cx resulted a group B strep. His anbx were changed to ceftriaxone on 9-20.He was d/c to SNF on 10-1 with plan for 6 weeks of ceftriaxone. He was d/c home ~ 2 weeks ago.  He had f/u MRI on 11-4 showing: 1. 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 There is paravertebral soft tissue enhancement likely representing a phlegmon extending into the right iliopsoas muscle. There is progressive disc destruction particularly at L2-3 and L4-5, worsened marrow edema and enhancement most concerning for worsening. There is no drainable fluid collection. 2. There is a small amount of fluid and enhancement involving the right facet joints at L1-2, L2-3 and L3-4 which may be secondary to advanced facet arthropathy versus septic arthritis. No significant interval change compared with the prior exam. TEE (-) 04-07-14.   He was seen in f/u on 11-16 and changed to augmentin. He was referred to neurosurgery.  He feels much better now. Not completely better though. Hardest is getting out of bed in AM. Also difficult getting up from sitting. Better with up and walking.  No f/c. No parasthesias in feet.  No problems with anbx- no rash, no diarrhea.    Review of Systems     Objective:   Physical Exam    Constitutional: He appears well-developed and well-nourished.  Cardiovascular: Normal rate, regular rhythm and normal heart sounds.   Pulmonary/Chest: Effort normal and breath sounds normal.  Abdominal: Soft. Bowel sounds are normal. He exhibits no distension. There is no tenderness.          Assessment & Plan:

## 2014-06-30 NOTE — Assessment & Plan Note (Signed)
He's doing better. Will recheck his ESR and CRP (nl and 30 in September respectively). Will continue his augmentin for 6 more weeks, repeat his MRI in 4 weeks. rtc in 6 weeks.

## 2014-07-01 ENCOUNTER — Other Ambulatory Visit: Payer: Self-pay | Admitting: Infectious Diseases

## 2014-07-01 DIAGNOSIS — M4626 Osteomyelitis of vertebra, lumbar region: Secondary | ICD-10-CM

## 2014-07-01 LAB — SEDIMENTATION RATE: Sed Rate: 1 mm/hr (ref 0–16)

## 2014-07-05 ENCOUNTER — Telehealth: Payer: Self-pay | Admitting: *Deleted

## 2014-07-05 NOTE — Telephone Encounter (Signed)
Patient notified of appointment for MRI at [facility] for 08/02/14 at 11:15 AM, [address]  Myrtis Hopping

## 2014-07-16 DIAGNOSIS — C3490 Malignant neoplasm of unspecified part of unspecified bronchus or lung: Secondary | ICD-10-CM

## 2014-07-16 HISTORY — DX: Malignant neoplasm of unspecified part of unspecified bronchus or lung: C34.90

## 2014-07-27 ENCOUNTER — Other Ambulatory Visit: Payer: Medicare HMO

## 2014-07-27 DIAGNOSIS — I480 Paroxysmal atrial fibrillation: Secondary | ICD-10-CM

## 2014-07-27 DIAGNOSIS — R739 Hyperglycemia, unspecified: Secondary | ICD-10-CM

## 2014-07-27 DIAGNOSIS — I10 Essential (primary) hypertension: Secondary | ICD-10-CM

## 2014-07-28 LAB — CBC WITH DIFFERENTIAL
Basophils Absolute: 0 10*3/uL (ref 0.0–0.2)
Basos: 0 %
EOS: 1 %
Eosinophils Absolute: 0.1 10*3/uL (ref 0.0–0.4)
HEMATOCRIT: 53.2 % — AB (ref 37.5–51.0)
Hemoglobin: 17.6 g/dL (ref 12.6–17.7)
IMMATURE GRANULOCYTES: 0 %
Immature Grans (Abs): 0 10*3/uL (ref 0.0–0.1)
LYMPHS ABS: 1.6 10*3/uL (ref 0.7–3.1)
Lymphs: 23 %
MCH: 33.1 pg — AB (ref 26.6–33.0)
MCHC: 33.1 g/dL (ref 31.5–35.7)
MCV: 100 fL — AB (ref 79–97)
MONOS ABS: 0.5 10*3/uL (ref 0.1–0.9)
Monocytes: 8 %
NEUTROS ABS: 4.6 10*3/uL (ref 1.4–7.0)
Neutrophils Relative %: 68 %
PLATELETS: 236 10*3/uL (ref 150–379)
RBC: 5.32 x10E6/uL (ref 4.14–5.80)
RDW: 14.2 % (ref 12.3–15.4)
WBC: 6.8 10*3/uL (ref 3.4–10.8)

## 2014-07-28 LAB — COMPREHENSIVE METABOLIC PANEL
A/G RATIO: 1.6 (ref 1.1–2.5)
ALT: 28 IU/L (ref 0–44)
AST: 25 IU/L (ref 0–40)
Albumin: 4.5 g/dL (ref 3.5–4.8)
Alkaline Phosphatase: 88 IU/L (ref 39–117)
BILIRUBIN TOTAL: 0.3 mg/dL (ref 0.0–1.2)
BUN / CREAT RATIO: 13 (ref 10–22)
BUN: 12 mg/dL (ref 8–27)
CALCIUM: 9.7 mg/dL (ref 8.6–10.2)
CO2: 24 mmol/L (ref 18–29)
Chloride: 100 mmol/L (ref 97–108)
Creatinine, Ser: 0.9 mg/dL (ref 0.76–1.27)
GFR calc Af Amer: 99 mL/min/{1.73_m2} (ref >59)
GFR, EST NON AFRICAN AMERICAN: 86 mL/min/{1.73_m2} (ref >59)
GLOBULIN, TOTAL: 2.9 g/dL (ref 1.5–4.5)
Glucose: 93 mg/dL (ref 65–99)
POTASSIUM: 4.5 mmol/L (ref 3.5–5.2)
Sodium: 143 mmol/L (ref 134–144)
Total Protein: 7.4 g/dL (ref 6.0–8.5)

## 2014-07-28 LAB — LIPID PANEL
CHOL/HDL RATIO: 3.5 ratio (ref 0.0–5.0)
Cholesterol, Total: 181 mg/dL (ref 100–199)
HDL: 51 mg/dL (ref >39)
LDL CALC: 99 mg/dL (ref 0–99)
TRIGLYCERIDES: 154 mg/dL — AB (ref 0–149)
VLDL Cholesterol Cal: 31 mg/dL (ref 5–40)

## 2014-07-28 LAB — HEMOGLOBIN A1C
ESTIMATED AVERAGE GLUCOSE: 105 mg/dL
HEMOGLOBIN A1C: 5.3 % (ref 4.8–5.6)

## 2014-07-29 ENCOUNTER — Encounter: Payer: Self-pay | Admitting: Nurse Practitioner

## 2014-07-29 ENCOUNTER — Ambulatory Visit (INDEPENDENT_AMBULATORY_CARE_PROVIDER_SITE_OTHER): Payer: Medicare HMO | Admitting: Nurse Practitioner

## 2014-07-29 VITALS — BP 136/72 | HR 81 | Temp 98.1°F | Resp 20 | Ht 66.0 in | Wt 208.6 lb

## 2014-07-29 DIAGNOSIS — Z1211 Encounter for screening for malignant neoplasm of colon: Secondary | ICD-10-CM

## 2014-07-29 DIAGNOSIS — Z0000 Encounter for general adult medical examination without abnormal findings: Secondary | ICD-10-CM

## 2014-07-29 DIAGNOSIS — F17200 Nicotine dependence, unspecified, uncomplicated: Secondary | ICD-10-CM

## 2014-07-29 DIAGNOSIS — I480 Paroxysmal atrial fibrillation: Secondary | ICD-10-CM

## 2014-07-29 DIAGNOSIS — Z720 Tobacco use: Secondary | ICD-10-CM

## 2014-07-29 DIAGNOSIS — I4891 Unspecified atrial fibrillation: Secondary | ICD-10-CM | POA: Insufficient documentation

## 2014-07-29 DIAGNOSIS — M869 Osteomyelitis, unspecified: Secondary | ICD-10-CM

## 2014-07-29 LAB — SPECIMEN STATUS REPORT

## 2014-07-29 NOTE — Progress Notes (Signed)
Patient ID: [name], male   DOB: Dec 07, 1940, 74 y.o.   MRN: [id number]    PCP: [name], NP  No Known Allergies  Chief Complaint  Patient presents with  . Annual Exam     HPI: Patient is a 74 y.o. male seen in the office today for wellness exam.  Has not gone to cardiologist, has appt this month conts follow up with ID-- MRI the 18th and appt the 25th of this month A1c of 5.3 pt is not diabetic  Colonoscopy- with [facility] does not know doctor- over 10 years Eye exam- "been awhile" 6-7 years Dental exam- last year, has partial  Screening CT- has not had Screening for AAA- unknown No family history of prostate cancer Has cut back to less than a pack a day- 17 cigarettes a day Does not drink ETOH  Review of Systems:  Review of Systems  Constitutional: Negative for activity change, appetite change, fatigue and unexpected weight change.  HENT: Positive for dental problem (dentures) and hearing loss (wears hearing aides). Negative for congestion.   Eyes: Negative.   Respiratory: Negative for cough, shortness of breath and wheezing.   Cardiovascular: Negative for chest pain, palpitations and leg swelling.  Gastrointestinal: Negative for abdominal pain, diarrhea and constipation.  Genitourinary: Positive for frequency (drinks a lot of coffee). Negative for dysuria and difficulty urinating.       Getting up 1-3 times at night to urinate, overall improving   Musculoskeletal: Positive for myalgias, arthralgias and gait problem.       Pains in back related to infection  Skin: Negative for color change and wound.  Neurological: Negative for dizziness and weakness.  Psychiatric/Behavioral: Negative for behavioral problems, confusion and agitation.    Past Medical History  Diagnosis Date  . Acute appendicitis with peritoneal abscess 01/28/2013  . Ileus, postoperative 01/28/2013  . Tobacco use disorder 01/28/2013  . Obesity, morbid 01/28/2013  . Shortness of breath   .  Essential hypertension 04/16/2014   Past Surgical History  Procedure Laterality Date  . Joint replacement      bilateral knee surgery  . Back surgery  1997, 2003    Dr.Apleton   . Laparoscopic appendectomy N/A 01/25/2013    Procedure: APPENDECTOMY LAPAROSCOPIC;  Surgeon: Ralene Ok, MD;  Location: [facility];  Service: General;  Laterality: N/A;  . Tee without cardioversion N/A 04/07/2014    Procedure: TRANSESOPHAGEAL ECHOCARDIOGRAM (TEE);  Surgeon: Candee Furbish, MD;  Location: [place];  Service: Cardiovascular;  Laterality: N/A;  . Shoulder surgery Right 2014    Dr.Whitfield    Social History:   reports that he has been smoking Cigarettes.  He has a 50 pack-year smoking history. He has never used smokeless tobacco. He reports that he does not drink alcohol or use illicit drugs.  No family history on file.  Medications: Patient's Medications  New Prescriptions   No medications on file  Previous Medications   AMIODARONE (PACERONE) 200 MG TABLET    Take 1 tablet (200 mg total) by mouth 2 (two) times daily.   AMOXICILLIN-CLAVULANATE (AUGMENTIN) 875-125 MG PER TABLET    Take 1 tablet by mouth 2 (two) times daily.   APIXABAN (ELIQUIS) 5 MG TABS TABLET    Take 1 tablet (5 mg total) by mouth 2 (two) times daily.   HYDRALAZINE (APRESOLINE) 25 MG TABLET    Take 1 tablet (25 mg total) by mouth every 6 (six) hours.   LOSARTAN (COZAAR) 50 MG TABLET  Take one tablet by mouth once daily for blood pressure  Modified Medications   No medications on file  Discontinued Medications   No medications on file     Physical Exam:  Filed Vitals:   07/29/14 0913  BP: 136/72  Pulse: 81  Temp: 98.1 F (36.7 C)  TempSrc: Oral  Resp: 20  Height: 5\' 6"  (1.676 m)  Weight: 208 lb 9.6 oz (94.62 kg)  SpO2: 96%    Physical Exam  Constitutional: He is oriented to person, place, and time. He appears well-developed and well-nourished. No distress.  HENT:  Head: Normocephalic and atraumatic.    Mouth/Throat: Oropharynx is clear and moist. No oropharyngeal exudate.  Eyes: Conjunctivae and EOM are normal. Pupils are equal, round, and reactive to light.  Neck: Normal range of motion. Neck supple.  Cardiovascular: Normal rate, regular rhythm and normal heart sounds.   Pulmonary/Chest: Effort normal and breath sounds normal.  Abdominal: Soft. Bowel sounds are normal.  Genitourinary: Penis normal.  Musculoskeletal: He exhibits tenderness (to lumbar spine). He exhibits no edema.  Neurological: He is alert and oriented to person, place, and time.  Skin: Skin is warm and dry. He is not diaphoretic.  Psychiatric: He has a normal mood and affect.    Labs reviewed: Basic Metabolic Panel:  Recent Labs  04/01/14 2018  04/03/14 0045 04/04/14 0309  04/07/14 6213  04/11/14 0450 04/14/14 0540 04/15/14 0450 05/05/14 07/27/14 0837  NA  --   < > 132* 138  < > 140  < > 142 140 141 139 143  K  --   < > 3.7 3.2*  < > 4.0  < > 3.7 3.3* 3.7 4.4 4.5  CL  --   < > 99 101  < > 104  < > 101 99 100  --  100  CO2  --   < > 20 24  < > 28  < > 30 32 31  --  24  GLUCOSE  --   < > 137* 121*  < > 111*  < > 105* 104* 115*  --  93  BUN  --   < > 23 20  < > 19  < > 14 17 16 17 12   CREATININE 0.80  < > 0.76 0.65  < > 0.65  < > 0.58 0.62 0.69 0.8 0.90  CALCIUM  --   < > 7.7* 7.8*  < > 8.3*  < > 8.5 8.9 8.6  --  9.7  MG  --   < > 2.2 2.1  --  2.2  --  2.0  --   --   --   --   PHOS  --   --  1.6* 2.0*  --   --   --   --   --   --   --   --   TSH 0.976  --   --   --   --   --   --   --   --   --   --   --   < > = values in this interval not displayed. Liver Function Tests:  Recent Labs  04/06/14 0302 04/07/14 0258 04/11/14 0450 05/05/14 07/27/14 0837  AST 41* 40* 27 23 25   ALT 42 39 26 28 28   ALKPHOS 96 117 101 107 88  BILITOT 0.8 0.9 0.6  --  0.3  PROT 5.6* 5.7* 6.2  --  7.4  ALBUMIN 2.2* 2.3* 2.4*  --   --  No results for input(s): LIPASE, AMYLASE in the last 8760 hours. No results for  input(s): AMMONIA in the last 8760 hours. CBC:  Recent Labs  04/07/14 0258  04/11/14 0450 04/14/14 0540 05/05/14 07/27/14 0837  WBC 15.7*  < > 12.2* 11.0* 8.7 6.8  NEUTROABS 13.6*  --  10.3*  --   --  4.6  HGB 16.1  < > 16.1 15.2 14.4 17.6  HCT 46.6  < > 47.6 45.3 43 53.2*  MCV 91.9  < > 96.9 95.2  --  100*  PLT 339  < > 384 432* 276 236  < > = values in this interval not displayed. Lipid Panel:  Recent Labs  07/27/14 0837  HDL 51  LDLCALC 99  TRIG 154*  CHOLHDL 3.5   TSH:  Recent Labs  04/01/14 2018  TSH 0.976   A1C: Lab Results  Component Value Date   HGBA1C 5.3 07/27/2014     Assessment/Plan  1. Smoker -will screen for lung ca, aaa and prostate cancer  - CT CHEST LOW DOSE SCREENING W/O CM; Future - [place] Screening AAA; Future - PSA  2. Special screening for malignant neoplasms, colon - Ambulatory referral to Gastroenterology  3. Osteomyelitis conts to follow up with ID, currently conts on Augmentin   4. Paroxysmal atrial fibrillation In SR today, rate controlled. conts on eliquis 5 mg BID, rate controlled with amiodarone   5. Preventative health care the patient is doing well, needs to quit smoking, education provided at length PREVENTIVE COUNSELING:  The patient was counseled regarding the appropriate use of alcohol, eye exams, prevention of dental and periodontal disease, diet, regular sustained exercise for at least 30 minutes 5 times per week, testicular self-examination on a monthly basis,smoking cessation, tobacco use,  and recommended schedule for GI hemoccult testing, colonoscopy, cholesterol, thyroid and diabetes screening.

## 2014-07-29 NOTE — Progress Notes (Signed)
Passed clock drawing 

## 2014-07-29 NOTE — Patient Instructions (Addendum)
Follow up in 6 months with---- Dr.    Reino Bellis dental check every 6 months, eye doctor every year, colonoscopy every 10 years (or sooner if needed) testicular self-examination on a monthly basis, smoking cessation Will have you screened for lung cancer, AAA and prostate cancer at this time  Abdominal Aortic Aneurysm--- AAA An aneurysm is a weakened or damaged part of an artery wall that bulges from the normal force of blood pumping through the body. An abdominal aortic aneurysm is an aneurysm that occurs in the lower part of the aorta, the main artery of the body.  The major concern with an abdominal aortic aneurysm is that it can enlarge and burst (rupture) or blood can flow between the layers of the wall of the aorta through a tear (aorticdissection). Both of these conditions can cause bleeding inside the body and can be life threatening unless diagnosed and treated promptly. CAUSES  The exact cause of an abdominal aortic aneurysm is unknown. Some contributing factors are:   A hardening of the arteries caused by the buildup of fat and other substances in the lining of a blood vessel (arteriosclerosis).  Inflammation of the walls of an artery (arteritis).   Connective tissue diseases, such as Marfan syndrome.   Abdominal trauma.   An infection, such as syphilis or staphylococcus, in the wall of the aorta (infectious aortitis) caused by bacteria. RISK FACTORS  Risk factors that contribute to an abdominal aortic aneurysm may include:  Age older than 46 years.   High blood pressure (hypertension).  Male gender.  Ethnicity (white race).  Obesity.  Family history of aneurysm (first degree relatives only).  Tobacco use. PREVENTION  The following healthy lifestyle habits may help decrease your risk of abdominal aortic aneurysm:  Quitting smoking. Smoking can raise your blood pressure and cause arteriosclerosis.  Limiting or avoiding alcohol.  Keeping your blood pressure,  blood sugar level, and cholesterol levels within normal limits.  Decreasing your salt intake. In somepeople, too much salt can raise blood pressure and increase your risk of abdominal aortic aneurysm.  Eating a diet low in saturated fats and cholesterol.  Increasing your fiber intake by including whole grains, vegetables, and fruits in your diet. Eating these foods may help lower blood pressure.  Maintaining a healthy weight.  Staying physically active and exercising regularly. SYMPTOMS  The symptoms of abdominal aortic aneurysm may vary depending on the size and rate of growth of the aneurysm.Most grow slowly and do not have any symptoms. When symptoms do occur, they may include:  Pain (abdomen, side, lower back, or groin). The pain may vary in intensity. A sudden onset of severe pain may indicate that the aneurysm has ruptured.  Feeling full after eating only small amounts of food.  Nausea or vomiting or both.  Feeling a pulsating lump in the abdomen.  Feeling faint or passing out. DIAGNOSIS  Since most unruptured abdominal aortic aneurysms have no symptoms, they are often discovered during diagnostic exams for other conditions. An aneurysm may be found during the following procedures:  Ultrasonography (A one-time screening for abdominal aortic aneurysm by ultrasonography is also recommended for all men aged 86-75 years who have ever smoked).  X-ray exams.  A computed tomography (CT).  Magnetic resonance imaging (MRI).  Angiography or arteriography. TREATMENT  Treatment of an abdominal aortic aneurysm depends on the size of your aneurysm, your age, and risk factors for rupture. Medication to control blood pressure and pain may be used to manage aneurysms smaller than  6 cm. Regular monitoring for enlargement may be recommended by your caregiver if:  The aneurysm is 3-4 cm in size (an annual ultrasonography may be recommended).  The aneurysm is 4-4.5 cm in size (an  ultrasonography every 6 months may be recommended).  The aneurysm is larger than 4.5 cm in size (your caregiver may ask that you be examined by a vascular surgeon). If your aneurysm is larger than 6 cm, surgical repair may be recommended. There are two main methods for repair of an aneurysm:   Endovascular repair (a minimally invasive surgery). This is done most often.  Open repair. This method is used if an endovascular repair is not possible. Document Released: 04/11/2005 Document Revised: 10/27/2012 Document Reviewed: 08/01/2012 [facility] Patient Information 2015 [place], [place]. This information is not intended to replace advice given to you by your health care provider. Make sure you discuss any questions you have with your health care provider.   Smoking Cessation Quitting smoking is important to your health and has many advantages. However, it is not always easy to quit since nicotine is a very addictive drug. Oftentimes, people try 3 times or more before being able to quit. This document explains the best ways for you to prepare to quit smoking. Quitting takes hard work and a lot of effort, but you can do it. ADVANTAGES OF QUITTING SMOKING  You will live longer, feel better, and live better.  Your body will feel the impact of quitting smoking almost immediately.  Within 20 minutes, blood pressure decreases. Your pulse returns to its normal level.  After 8 hours, carbon monoxide levels in the blood return to normal. Your oxygen level increases.  After 24 hours, the chance of having a heart attack starts to decrease. Your breath, hair, and body stop smelling like smoke.  After 48 hours, damaged nerve endings begin to recover. Your sense of taste and smell improve.  After 72 hours, the body is virtually free of nicotine. Your bronchial tubes relax and breathing becomes easier.  After 2 to 12 weeks, lungs can hold more air. Exercise becomes easier and circulation improves.  The  risk of having a heart attack, stroke, cancer, or lung disease is greatly reduced.  After 1 year, the risk of coronary heart disease is cut in half.  After 5 years, the risk of stroke falls to the same as a nonsmoker.  After 10 years, the risk of lung cancer is cut in half and the risk of other cancers decreases significantly.  After 15 years, the risk of coronary heart disease drops, usually to the level of a nonsmoker.  If you are pregnant, quitting smoking will improve your chances of having a healthy baby.  The people you live with, especially any children, will be healthier.  You will have extra money to spend on things other than cigarettes. QUESTIONS TO THINK ABOUT BEFORE ATTEMPTING TO QUIT You may want to talk about your answers with your health care provider.  Why do you want to quit?  If you tried to quit in the past, what helped and what did not?  What will be the most difficult situations for you after you quit? How will you plan to handle them?  Who can help you through the tough times? Your family? Friends? A health care provider?  What pleasures do you get from smoking? What ways can you still get pleasure if you quit? Here are some questions to ask your health care provider:  How can you help  me to be successful at quitting?  What medicine do you think would be best for me and how should I take it?  What should I do if I need more help?  What is smoking withdrawal like? How can I get information on withdrawal? GET READY  Set a quit date.  Change your environment by getting rid of all cigarettes, ashtrays, matches, and lighters in your home, car, or work. Do not let people smoke in your home.  Review your past attempts to quit. Think about what worked and what did not. GET SUPPORT AND ENCOURAGEMENT You have a better chance of being successful if you have help. You can get support in many ways.  Tell your family, friends, and coworkers that you are going to  quit and need their support. Ask them not to smoke around you.  Get individual, group, or telephone counseling and support. Programs are available at [employer] and health centers. Call your local health department for information about programs in your area.  Spiritual beliefs and practices may help some smokers quit.  Download a "quit meter" on your computer to keep track of quit statistics, such as how long you have gone without smoking, cigarettes not smoked, and money saved.  Get a self-help book about quitting smoking and staying off tobacco. [place] yourself from urges to smoke. Talk to someone, go for a walk, or occupy your time with a task.  Change your normal routine. Take a different route to work. Drink tea instead of coffee. Eat breakfast in a different place.  Reduce your stress. Take a hot bath, exercise, or read a book.  Plan something enjoyable to do every day. Reward yourself for not smoking.  Explore interactive web-based programs that specialize in helping you quit. GET MEDICINE AND USE IT CORRECTLY Medicines can help you stop smoking and decrease the urge to smoke. Combining medicine with the above behavioral methods and support can greatly increase your chances of successfully quitting smoking.  Nicotine replacement therapy helps deliver nicotine to your body without the negative effects and risks of smoking. Nicotine replacement therapy includes nicotine gum, lozenges, inhalers, nasal sprays, and skin patches. Some may be available over-the-counter and others require a prescription.  Antidepressant medicine helps people abstain from smoking, but how this works is unknown. This medicine is available by prescription.  Nicotinic receptor partial agonist medicine simulates the effect of nicotine in your brain. This medicine is available by prescription. Ask your health care provider for advice about which medicines to use and how to  use them based on your health history. Your health care provider will tell you what side effects to look out for if you choose to be on a medicine or therapy. Carefully read the information on the package. Do not use any other product containing nicotine while using a nicotine replacement product.  RELAPSE OR DIFFICULT SITUATIONS Most relapses occur within the first 3 months after quitting. Do not be discouraged if you start smoking again. Remember, most people try several times before finally quitting. You may have symptoms of withdrawal because your body is used to nicotine. You may crave cigarettes, be irritable, feel very hungry, cough often, get headaches, or have difficulty concentrating. The withdrawal symptoms are only temporary. They are strongest when you first quit, but they will go away within 10-14 days. To reduce the chances of relapse, try to:  Avoid drinking alcohol. Drinking lowers your chances of successfully quitting.  Reduce  the amount of caffeine you consume. Once you quit smoking, the amount of caffeine in your body increases and can give you symptoms, such as a rapid heartbeat, sweating, and anxiety.  Avoid smokers because they can make you want to smoke.  Do not let weight gain distract you. Many smokers will gain weight when they quit, usually less than 10 pounds. Eat a healthy diet and stay active. You can always lose the weight gained after you quit.  Find ways to improve your mood other than smoking. FOR MORE INFORMATION  www.smokefree.gov  Document Released: 06/26/2001 Document Revised: 11/16/2013 Document Reviewed: 10/11/2011 [facility] Patient Information 2015 [place], [place]. This information is not intended to replace advice given to you by your health care provider. Make sure you discuss any questions you have with your health care provider.

## 2014-07-30 ENCOUNTER — Encounter: Payer: Self-pay | Admitting: Gastroenterology

## 2014-07-30 LAB — PSA: PSA: 6 ng/mL — ABNORMAL HIGH (ref 0.0–4.0)

## 2014-07-30 LAB — SPECIMEN STATUS REPORT

## 2014-08-02 ENCOUNTER — Ambulatory Visit
Admission: RE | Admit: 2014-08-02 | Discharge: 2014-08-02 | Disposition: A | Discharge status: Home / Self Care | Payer: Medicare HMO | Source: Ambulatory Visit | Attending: Infectious Diseases | Admitting: Infectious Diseases

## 2014-08-02 DIAGNOSIS — M4626 Osteomyelitis of vertebra, lumbar region: Secondary | ICD-10-CM

## 2014-08-02 MED ORDER — GADOBENATE DIMEGLUMINE 529 MG/ML IV SOLN
20.0000 mL | Freq: Once | INTRAVENOUS | Status: AC | PRN
  Administered 2014-08-02: 20 mL via INTRAVENOUS

## 2014-08-05 ENCOUNTER — Ambulatory Visit
Admission: RE | Admit: 2014-08-05 | Discharge: 2014-08-05 | Disposition: A | Discharge status: Home / Self Care | Payer: Medicare HMO | Source: Ambulatory Visit | Attending: Nurse Practitioner | Admitting: Nurse Practitioner

## 2014-08-05 DIAGNOSIS — F17200 Nicotine dependence, unspecified, uncomplicated: Secondary | ICD-10-CM

## 2014-08-09 ENCOUNTER — Ambulatory Visit (INDEPENDENT_AMBULATORY_CARE_PROVIDER_SITE_OTHER): Payer: Medicare HMO | Admitting: Infectious Diseases

## 2014-08-09 ENCOUNTER — Encounter: Payer: Self-pay | Admitting: Infectious Diseases

## 2014-08-09 ENCOUNTER — Other Ambulatory Visit: Payer: Self-pay | Admitting: Nurse Practitioner

## 2014-08-09 DIAGNOSIS — R972 Elevated prostate specific antigen [PSA]: Secondary | ICD-10-CM

## 2014-08-09 DIAGNOSIS — F17200 Nicotine dependence, unspecified, uncomplicated: Secondary | ICD-10-CM

## 2014-08-09 DIAGNOSIS — Z720 Tobacco use: Secondary | ICD-10-CM

## 2014-08-09 DIAGNOSIS — M869 Osteomyelitis, unspecified: Secondary | ICD-10-CM

## 2014-08-09 MED ORDER — AMOXICILLIN-POT CLAVULANATE 875-125 MG PO TABS: 1.0000 | ORAL_TABLET | Freq: Two times a day (BID) | ORAL | Status: DC

## 2014-08-09 NOTE — Assessment & Plan Note (Signed)
Encouraged him to lose wt

## 2014-08-09 NOTE — Progress Notes (Signed)
Subjective:    Patient ID: [name], male    DOB: 1940-08-29, 74 y.o.   MRN: [id number]  HPI   Kurt Riches, MD at 06/30/2014 2:17 PM     Status: Signed       Expand All Collapse All     Subjective:    Patient ID: [name], male DOB: 21-Jun-1941, 73 y.o. MRN: [id number]  HPI 74 yo M with hx of obesity, previous back surgeries (last 2003), adm [facility] on 9-17 with 3 days of n/v, diarrhea. He fell out of bed September 2015 and was unable to get up and was down for several hours (? 20h). He was felt to have sepsis, dehydration (HR 140s, WBC 16.6, CK >12,000, and AST 254/ALT 89). He was also found to have afib with RVR. He was started on cipro/flagyl for GU/GI coverage. This was changed to zosyn within 24h due to CT showing stranding around kidney/concern for pyelo. He was then noted to have GPC bacteremia. Was started on vanco on 9-18. Cx resulted a group B strep. His anbx were changed to ceftriaxone on 9-20.He was d/c to SNF on 10-1 with plan for 6 weeks of ceftriaxone. He was d/c home beginning of Decemeber.  He had f/u MRI on 11-4 showing: 1. 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 There is paravertebral soft tissue enhancement likely representing a phlegmon extending into the right iliopsoas muscle. There is progressive disc destruction particularly at L2-3 and L4-5, worsened marrow edema and enhancement most concerning for worsening. There is no drainable fluid collection. 2. There is a small amount of fluid and enhancement involving the right facet joints at L1-2, L2-3 and L3-4 which may be secondary to advanced facet arthropathy versus septic arthritis. No significant interval change compared with the prior exam. TEE (-) 04-07-14.   He was seen in f/u on 11-16 and changed to augmentin. He was referred  to neurosurgery.      He had f/u MRI 08-02-14: Changes of diskitis and osteomyelitis at  L2-3, L3-4 and L4-5 and likely septic facet joints on the left at L1-2, L2-3 and L3-4 persist but have improved since the most recent examination. No new abnormality is identified.  Erythrocyte Sedimentation Rate     Component Value Date/Time   ESRSEDRATE 1 06/30/2014 1458    C-Reactive Protein     Component Value Date/Time   CRP <0.5 06/30/2014 1458   Feels better today, than last visit (which was even better than his hospitalization). Has been doing well taking his augmentin. He enjoyed his MRI- had music playing, was not loud, was comfortable.  still has some pain but is much improved. Mostly with movements- getting up and down. Has pain in his R flank as well.  Has given up his cane over the last 2 weeks.   Review of Systems  Constitutional: Negative for fever and chills.  Musculoskeletal: Positive for back pain.       Objective:   Physical Exam  Constitutional: He appears well-developed and well-nourished.  HENT:  Mouth/Throat: No oropharyngeal exudate.  Eyes: EOM are normal. Pupils are equal, round, and reactive to light.  Neck: Neck supple.  Cardiovascular: Normal rate, regular rhythm and normal heart sounds.   Pulmonary/Chest: Effort normal and breath sounds normal.  Abdominal: Soft. Bowel sounds are normal. He exhibits no distension. There is no tenderness.  Musculoskeletal:       Arms: Lymphadenopathy:    He has no  cervical adenopathy.          Assessment & Plan:

## 2014-08-09 NOTE — Assessment & Plan Note (Signed)
Encouraged to quit. 

## 2014-08-09 NOTE — Assessment & Plan Note (Signed)
Will complete his anbx in 2 months Will have him seen by PT/OT.  He is doing well.  Will see him back as needed.

## 2014-08-12 ENCOUNTER — Ambulatory Visit (INDEPENDENT_AMBULATORY_CARE_PROVIDER_SITE_OTHER): Payer: Medicare HMO | Admitting: Cardiology

## 2014-08-12 ENCOUNTER — Encounter: Payer: Self-pay | Admitting: Cardiology

## 2014-08-12 VITALS — BP 132/68 | HR 81 | Ht 67.0 in | Wt 208.0 lb

## 2014-08-12 DIAGNOSIS — Z7901 Long term (current) use of anticoagulants: Secondary | ICD-10-CM

## 2014-08-12 DIAGNOSIS — M869 Osteomyelitis, unspecified: Secondary | ICD-10-CM

## 2014-08-12 DIAGNOSIS — I480 Paroxysmal atrial fibrillation: Secondary | ICD-10-CM

## 2014-08-12 MED ORDER — AMIODARONE HCL 100 MG PO TABS: 100.0000 mg | ORAL_TABLET | Freq: Every day | ORAL | Status: DC

## 2014-08-12 NOTE — Progress Notes (Signed)
[facility]. [address], [name], [name]  Date:  08/12/2014   ID:  [name], DOB 11/12/1940, MRN [id number]  PCP:  [name], NP   History of Present Illness: [name] is a 74 y.o. male hospitalization in September 2015, atrial fibrillation with rapid ventricular response who converted to normal sinus rhythm on amiodarone, diltiazem, on chronic anticoagulation with Eliquis, normal echocardiogram with bacteremia, transesophageal echocardiogram showing no vegetations, reassuring.   His hospitalization, worse days of his life. Terrible back pain. Currently has osteomyelitis and will complete antibiotic therapy in 2 months. Dr. Johnnye Sima with infectious disease has been seeing him. Physical therapy.  MRI of back septic joints.  He has not had any further episodes of atrial fibrillation. No SOB, no CP.    Wt Readings from Last 3 Encounters:  08/12/14 208 lb (94.348 kg)  08/09/14 209 lb (94.802 kg)  07/29/14 208 lb 9.6 oz (94.62 kg)     Past Medical History  Diagnosis Date  . Acute appendicitis with peritoneal abscess 01/28/2013  . Ileus, postoperative 01/28/2013  . Tobacco use disorder 01/28/2013  . Obesity, morbid 01/28/2013  . Shortness of breath   . Essential hypertension 04/16/2014    Past Surgical History  Procedure Laterality Date  . Joint replacement      bilateral knee surgery  . Back surgery  1997, 2003    Dr.Apleton   . Laparoscopic appendectomy N/A 01/25/2013    Procedure: APPENDECTOMY LAPAROSCOPIC;  Surgeon: Ralene Ok, MD;  Location: [place];  Service: General;  Laterality: N/A;  . Tee without cardioversion N/A 04/07/2014    Procedure: TRANSESOPHAGEAL ECHOCARDIOGRAM (TEE);  Surgeon: Candee Furbish, MD;  Location: [facility];  Service: Cardiovascular;  Laterality: N/A;  . Shoulder surgery Right 2014    Dr.Whitfield     Current Outpatient Prescriptions  Medication Sig Dispense Refill   . amiodarone (PACERONE) 200 MG tablet Take 1 tablet (200 mg total) by mouth 2 (two) times daily. 30 tablet 3  . amoxicillin-clavulanate (AUGMENTIN) 875-125 MG per tablet Take 1 tablet by mouth 2 (two) times daily. 60 tablet 3  . apixaban (ELIQUIS) 5 MG TABS tablet Take 1 tablet (5 mg total) by mouth 2 (two) times daily. 30 tablet 3  . hydrALAZINE (APRESOLINE) 25 MG tablet Take 1 tablet (25 mg total) by mouth every 6 (six) hours. (Patient taking differently: Take 25 mg by mouth 3 (three) times daily. ) 90 tablet 1  . losartan (COZAAR) 50 MG tablet Take one tablet by mouth once daily for blood pressure 30 tablet 3   No current facility-administered medications for this visit.    Allergies:   No Known Allergies  Social History:  The patient  reports that he has been smoking Cigars.  He has never used smokeless tobacco. He reports that he does not drink alcohol or use illicit drugs.   No early family history of coronary artery disease  ROS:  Please see the history of present illness.   Denies any syncope, bleeding, orthopnea, PND   All other systems reviewed and negative.   PHYSICAL EXAM: VS:  BP 132/68 mmHg  Pulse 81  Ht 5\' 7"  (1.702 m)  Wt 208 lb (94.348 kg)  BMI 32.57 kg/m2 Well nourished, well developed, in no acute distress HEENT: normal, [place]/AT, EOMI Neck: no JVD, normal carotid upstroke, no bruit Cardiac:  normal S1, S2; RRR; no murmur Lungs:  clear to  auscultation bilaterally, no wheezing, rhonchi or rales Abd: soft, nontender, no hepatomegaly, no bruitsoverweight Ext: no edema, 2+ distal pulses Skin: warm and dry GU: deferred Neuro: no focal abnormalities noted, AAO x 3  EKG:  08/12/14-normal rhythm, left axis deviation, vertical axis, poor R-wave progression. QTC 473 ms.    Labs: 07/27/14-liver functions normal, creatinine 0.9, hemoglobin 17.6. TSH from 04/01/14 was 0.9.  ASSESSMENT AND PLAN:  1. Paroxysmal atrial fibrillation-converted on amiodarone during  hospitalization in September 2015 in the setting of bacteremia. I would like to decrease this dosage to 100 mg a day, low-dose to see now that he is completing therapy for osteomyelitis that his inflammatory response has lessened and therefore will have less propensity for atrial fibrillation. Lab work on amiodarone is reassuring. Ultimately, it is likely that he had a reversible cause for his atrial fibrillation i.e. severe infection. If he is not demonstrating atrial fibrillation over the next several months we could consider monitoring him then if normal, discontinuation of his anticoagulation may be reasonable. Of course, there is always a risk that he would return in to atrial fibrillation unmonitored and potential stroke risk is present. Lab work reassuring. 2. Chronic anticoagulation-continue with Eliquis for now. Quite expensive for him, $170. Card has been given. 3. Osteomyelitis-Dr. Hatcher's note reviewed. Antibiotics. 4. Obesity-continue to encourage weight loss. Risk factor for developing atrial fibrillation once again. 5. Essential hypertension-normal. Well control. 6. Four-month follow-up  Signed, Candee Furbish, MD [facility]  08/12/2014 11:11 AM

## 2014-08-12 NOTE — Patient Instructions (Signed)
Please decrease Amiodarone to 100 mg a day. Continue all other medications as listed.  Follow up in 4 months with Dr Marlou Porch.  Thank you for choosing [facility]!!

## 2014-08-25 ENCOUNTER — Other Ambulatory Visit: Payer: Self-pay | Admitting: *Deleted

## 2014-08-25 MED ORDER — HYDRALAZINE HCL 25 MG PO TABS: ORAL_TABLET | ORAL | Status: DC

## 2014-08-25 NOTE — Telephone Encounter (Signed)
Patient Requested to be faxed to pharmacy.

## 2014-09-09 ENCOUNTER — Other Ambulatory Visit: Payer: Self-pay | Admitting: *Deleted

## 2014-09-09 DIAGNOSIS — M4620 Osteomyelitis of vertebra, site unspecified: Secondary | ICD-10-CM

## 2014-09-13 ENCOUNTER — Encounter: Payer: Self-pay | Admitting: Gastroenterology

## 2014-09-13 ENCOUNTER — Ambulatory Visit (INDEPENDENT_AMBULATORY_CARE_PROVIDER_SITE_OTHER): Payer: Medicare HMO | Admitting: Gastroenterology

## 2014-09-13 VITALS — BP 146/60 | HR 80 | Ht 67.0 in | Wt 213.4 lb

## 2014-09-13 DIAGNOSIS — Z1211 Encounter for screening for malignant neoplasm of colon: Secondary | ICD-10-CM

## 2014-09-13 NOTE — Patient Instructions (Addendum)
One of your biggest health concerns is your smoking.  This increases your risk for most cancers and serious cardiovascular diseases such as strokes, heart attacks.  You should try your best to stop.  If you need assistance, please contact your PCP or Smoking Cessation Class at [facility] [phone]) or [facility] (1-800-QUIT-NOW). Please return to see Dr. Ardis Hughs in mid to late May to discuss colonoscopy again; at that point you should be OFF antibiotics and MAY BE OFF blood thinners. Appt with Dr Ardis Hughs on 11/30/14 at 9:45 am. We will get records sent from your previous gastroenterologist Dr. Earlean Shawl for review.  This will include any endoscopic (colonoscopy or upper endoscopy) procedures and any associated pathology reports.

## 2014-09-13 NOTE — Progress Notes (Signed)
HPI: This is a   very pleasant 74 year old man whom I am meeting for the first time today.  Was in hosp for 6 weeks for osteo of spine.  Should be completed with antibiotics in another 4 weeks.  While he was in the hospital he had atrial fibrillation. He has been on blood thinner since then. Recent visit with his cardiologist it appears that he may be able to come off those blood thinners as long as he does not convert back into atrial fibrillation.  Here in the office he has nice regular pulse.  He has had 3 colonoscopies (2 of them with Dr. Earlean Shawl while he was with [place]). He thinks it's been 10 years since last one.  No colon cancer in his family.  No polyps in the past.  Bowels fine;  No overt bleeding.  Review of systems: Pertinent positive and negative review of systems were noted in the above HPI section. Complete review of systems was performed and was otherwise normal.    Past Medical History  Diagnosis Date  . Acute appendicitis with peritoneal abscess 01/28/2013  . Ileus, postoperative 01/28/2013  . Tobacco use disorder 01/28/2013  . Obesity, morbid 01/28/2013  . Shortness of breath   . Essential hypertension 04/16/2014    Past Surgical History  Procedure Laterality Date  . Joint replacement      bilateral knee surgery  . Back surgery  1997, 2003    Dr.Apleton   . Laparoscopic appendectomy N/A 01/25/2013    Procedure: APPENDECTOMY LAPAROSCOPIC;  Surgeon: Ralene Ok, MD;  Location: [facility];  Service: General;  Laterality: N/A;  . Tee without cardioversion N/A 04/07/2014    Procedure: TRANSESOPHAGEAL ECHOCARDIOGRAM (TEE);  Surgeon: Candee Furbish, MD;  Location: [place];  Service: Cardiovascular;  Laterality: N/A;  . Shoulder surgery Right 2014    Dr.Whitfield     Current Outpatient Prescriptions  Medication Sig Dispense Refill  . amiodarone (PACERONE) 100 MG tablet Take 1 tablet (100 mg total) by mouth daily.    Marland Kitchen amoxicillin-clavulanate (AUGMENTIN) 875-125 MG  per tablet Take 1 tablet by mouth 2 (two) times daily. 60 tablet 3  . apixaban (ELIQUIS) 5 MG TABS tablet Take 1 tablet (5 mg total) by mouth 2 (two) times daily. 30 tablet 3  . hydrALAZINE (APRESOLINE) 25 MG tablet Take one tablet by mouth three times daily for blood pressure 90 tablet 3  . losartan (COZAAR) 50 MG tablet Take one tablet by mouth once daily for blood pressure 30 tablet 3   No current facility-administered medications for this visit.    Allergies as of 09/13/2014  . (No Known Allergies)    Family History  Problem Relation Age of Onset  . Irritable bowel syndrome Mother     History   Social History  . Marital Status: Divorced    Spouse Name: N/A  . Number of Children: N/A  . Years of Education: N/A   Occupational History  . Retired    Social History Main Topics  . Smoking status: Current Every Day Smoker -- 1.00 packs/day for 50 years    Types: Cigars  . Smokeless tobacco: Never Used  . Alcohol Use: No  . Drug Use: No  . Sexual Activity: Not Currently   Other Topics Concern  . Not on file   Social History Narrative   No diet   Yes, eats/drinks things with caffeine    Divorced, married 1979   Lives in a house, one stories , one person, no  pets   Current/past profession- [employer]   Patient exercises, golf 3 days weekly         Physical Exam: BP 146/60 mmHg  Pulse 80  Ht 5\' 7"  (1.702 m)  Wt 213 lb 6.4 oz (96.798 kg)  BMI 33.42 kg/m2 Constitutional: generally well-appearing Psychiatric: alert and oriented x3 Eyes: extraocular movements intact Mouth: oral pharynx moist, no lesions Neck: supple no lymphadenopathy Cardiovascular: heart regular rate and rhythm Lungs: clear to auscultation bilaterally Abdomen: soft, nontender, nondistended, no obvious ascites, no peritoneal signs, normal bowel sounds Extremities: no lower extremity edema bilaterally Skin: no lesions on visible extremities    Assessment and plan: 74 y.o.  male with  routine risk for colon cancer  First we'll try to track down his previous colonoscopy records. He may not due for colon cancer screening yet. If he is due around now, I would like to at least wait and he is off of antibiotics for his osteomyelitis. That is about one month from now. Shortly after that he is going be sitting down again with his cardiologist, he may be able to come off of blood thinners pending that cardiology evaluation. He will return to see me in late May which is shortly after his next cardiology visit.  I see no reason for any further blood tests or imaging studies at this point.

## 2014-09-24 ENCOUNTER — Other Ambulatory Visit: Payer: Self-pay

## 2014-09-24 ENCOUNTER — Telehealth: Payer: Self-pay | Admitting: Gastroenterology

## 2014-09-24 MED ORDER — AMIODARONE HCL 100 MG PO TABS
100.0000 mg | ORAL_TABLET | Freq: Every day | ORAL | Status: DC
Start: 2014-09-24 — End: 2014-11-29

## 2014-09-24 NOTE — Telephone Encounter (Signed)
Colonoscopy, Dr. Earlean Shawl, 11/2003; done to "evaluate hematochezia."  Prep was good; Findings: internal hemorrhoids, no polyps; report shows he was recommended to have recall colonoscopy in 5-10 years and then the number 5 was circled and initialed; these recommendations are not consistent with national guidelines.

## 2014-09-27 ENCOUNTER — Ambulatory Visit: Payer: Medicare HMO | Admitting: Physical Therapy

## 2014-10-24 ENCOUNTER — Other Ambulatory Visit: Payer: Self-pay | Admitting: Nurse Practitioner

## 2014-11-01 ENCOUNTER — Other Ambulatory Visit: Payer: Medicare HMO

## 2014-11-02 LAB — PSA: PSA: 6.4 ng/mL — ABNORMAL HIGH (ref 0.0–4.0)

## 2014-11-03 ENCOUNTER — Ambulatory Visit (INDEPENDENT_AMBULATORY_CARE_PROVIDER_SITE_OTHER): Payer: Medicare HMO | Admitting: Internal Medicine

## 2014-11-03 ENCOUNTER — Encounter: Payer: Self-pay | Admitting: Internal Medicine

## 2014-11-03 ENCOUNTER — Ambulatory Visit: Payer: Self-pay | Admitting: Internal Medicine

## 2014-11-03 VITALS — BP 116/64 | HR 85 | Temp 98.0°F | Ht 66.0 in | Wt 209.0 lb

## 2014-11-03 DIAGNOSIS — G8929 Other chronic pain: Secondary | ICD-10-CM | POA: Diagnosis not present

## 2014-11-03 DIAGNOSIS — M549 Dorsalgia, unspecified: Secondary | ICD-10-CM | POA: Diagnosis not present

## 2014-11-03 DIAGNOSIS — I480 Paroxysmal atrial fibrillation: Secondary | ICD-10-CM

## 2014-11-03 DIAGNOSIS — R5381 Other malaise: Secondary | ICD-10-CM

## 2014-11-03 DIAGNOSIS — R972 Elevated prostate specific antigen [PSA]: Secondary | ICD-10-CM

## 2014-11-03 DIAGNOSIS — Z7901 Long term (current) use of anticoagulants: Secondary | ICD-10-CM | POA: Diagnosis not present

## 2014-11-03 DIAGNOSIS — I10 Essential (primary) hypertension: Secondary | ICD-10-CM | POA: Diagnosis not present

## 2014-11-03 DIAGNOSIS — Z716 Tobacco abuse counseling: Secondary | ICD-10-CM

## 2014-11-03 MED ORDER — TETANUS-DIPHTH-ACELL PERTUSSIS 5-2.5-18.5 LF-MCG/0.5 IM SUSP: 0.5000 mL | Freq: Once | INTRAMUSCULAR | Status: DC

## 2014-11-03 NOTE — Patient Instructions (Addendum)
Recommend urology eval for elevated PSA (blood prostate test).  Recommend smoking cessation with medication (wellbutrin, zyban, vs chantix)  May need to change eliquis to coumadin but will require close monitoring as he is also taking amiodarone. Check with cardiology regarding stopping eliquis and/or bridging with lovenox for colonoscopy

## 2014-11-03 NOTE — Progress Notes (Signed)
Patient ID: [name], male   DOB: 05/23/1941, 74 y.o.   MRN: [id number]    Facility  [facility]    Place of Service:   OFFICE    No Known Allergies  Chief Complaint  Patient presents with  . Medical Management of Chronic Issues    3 month follow-up, discuss recent labs (copy printed)  . Medication Management    Discuss who will monitior Eliquis and decided when to d/c   . Immunizations    RX printed for TDaP, shingles vaccine- refused     HPI:  74 yo male seen today for f/u. He continues to pay out-of-pocket $185 per month for eliquis. He takes it for anticoagulation for afib. No palpitations.  BP stable on losartan and hydralazine.  He is still smoking cigs. He reduced amt to < 1ppd. He has tried to quit with hypnosis in the past. He has not tried pharmocologic tx.   Past Medical History  Diagnosis Date  . Acute appendicitis with peritoneal abscess 01/28/2013  . Ileus, postoperative 01/28/2013  . Tobacco use disorder 01/28/2013  . Obesity, morbid 01/28/2013  . Shortness of breath   . Essential hypertension 04/16/2014   Past Surgical History  Procedure Laterality Date  . Joint replacement      bilateral knee surgery  . Back surgery  1997, 2003    Dr.Apleton   . Laparoscopic appendectomy N/A 01/25/2013    Procedure: APPENDECTOMY LAPAROSCOPIC;  Surgeon: Ralene Ok, MD;  Location: [place];  Service: General;  Laterality: N/A;  . Tee without cardioversion N/A 04/07/2014    Procedure: TRANSESOPHAGEAL ECHOCARDIOGRAM (TEE);  Surgeon: Candee Furbish, MD;  Location: [facility];  Service: Cardiovascular;  Laterality: N/A;  . Shoulder surgery Right 2014    Dr.Whitfield    History   Social History  . Marital Status: Divorced    Spouse Name: N/A  . Number of Children: N/A  . Years of Education: N/A   Occupational History  . Retired    Social History Main Topics  . Smoking status: Current Every Day Smoker -- 1.00 packs/day for 50 years    Types: Cigars  . Smokeless  tobacco: Never Used     Comment: 1 pack of less  . Alcohol Use: No  . Drug Use: No  . Sexual Activity: Not Currently   Other Topics Concern  . None   Social History Narrative   No diet   Yes, eats/drinks things with caffeine    Divorced, married 1979   Lives in a house, one stories , one person, no pets   Current/past profession- [employer]   Patient exercises, golf 3 days weekly       Medications: Patient's Medications  New Prescriptions   No medications on file  Previous Medications   AMIODARONE (PACERONE) 100 MG TABLET    Take 1 tablet (100 mg total) by mouth daily.   APIXABAN (ELIQUIS) 5 MG TABS TABLET    Take 1 tablet (5 mg total) by mouth 2 (two) times daily.   HYDRALAZINE (APRESOLINE) 25 MG TABLET    Take one tablet by mouth three times daily for blood pressure   LOSARTAN (COZAAR) 50 MG TABLET    TAKE 1 TABLET BY MOUTH ONCE DAILY FOR BLOOD PRESSURE  Modified Medications   Modified Medication Previous Medication   TDAP (BOOSTRIX) 5-2.5-18.5 LF-MCG/0.5 INJECTION Tdap (BOOSTRIX) 5-2.5-18.5 LF-MCG/0.5 injection      Inject 0.5 mLs into the muscle once.    Inject 0.5 mLs into  the muscle once.  Discontinued Medications   AMOXICILLIN-CLAVULANATE (AUGMENTIN) 875-125 MG PER TABLET    Take 1 tablet by mouth 2 (two) times daily.   ZOSTER VACCINE LIVE, PF, (ZOSTAVAX) 79024 UNT/0.65ML INJECTION    Inject 0.65 mLs into the skin once.     Review of Systems  Constitutional: Positive for activity change (since dx of afib) and fatigue. Negative for chills.  HENT: Negative for sore throat and trouble swallowing.   Eyes: Negative for visual disturbance.  Respiratory: Negative for cough, chest tightness and shortness of breath.   Cardiovascular: Negative for chest pain, palpitations and leg swelling.  Gastrointestinal: Negative for nausea, vomiting, abdominal pain and blood in stool.  Genitourinary: Negative for dysuria, urgency, frequency and difficulty urinating.    Musculoskeletal: Positive for back pain. Negative for arthralgias and gait problem.  Skin: Negative for rash.  Neurological: Negative for weakness and headaches.  Psychiatric/Behavioral: Negative for confusion and sleep disturbance. The patient is not nervous/anxious.     Filed Vitals:   11/03/14 0916  BP: 116/64  Pulse: 85  Temp: 98 F (36.7 C)  TempSrc: Oral  Height: '5\' 6"'$  (1.676 m)  Weight: 209 lb (94.802 kg)  SpO2: 95%   Body mass index is 33.75 kg/(m^2).  Physical Exam  Constitutional: He is oriented to person, place, and time. He appears well-developed and well-nourished. No distress.  HENT:  Mouth/Throat: Oropharynx is clear and moist.  Left TM not completely visualized due to external ear canal cerumen. Right TM intact, nonbulging, no redness.  Eyes: Pupils are equal, round, and reactive to light. No scleral icterus.  Neck: Neck supple. No thyromegaly present.  Cardiovascular: Normal rate, regular rhythm and intact distal pulses.  Exam reveals no gallop and no friction rub.   Murmur (1/6 SEM) heard. No carotid bruit b/l; no distal LE swelling  Pulmonary/Chest: Effort normal and breath sounds normal. He has no wheezes. He has no rales. He exhibits no tenderness.  Abdominal: Soft. Bowel sounds are normal. He exhibits no distension, no abdominal bruit, no pulsatile midline mass and no mass. There is no tenderness. There is no rebound and no guarding.  Lymphadenopathy:    He has no cervical adenopathy.  Neurological: He is alert and oriented to person, place, and time. He has normal reflexes.  Skin: Skin is warm and dry. No rash noted.  Psychiatric: He has a normal mood and affect. His behavior is normal. Judgment and thought content normal.     Labs reviewed: Office Visit on 11/03/2014  Component Date Value Ref Range Status  . PSA 11/01/2014 6.4* 0.0 - 4.0 ng/mL Final   Comment: Roche ECLIA methodology. According to the American Urological Association, Serum PSA  should decrease and remain at undetectable levels after radical prostatectomy. The AUA defines biochemical recurrence as an initial PSA value 0.2 ng/mL or greater followed by a subsequent confirmatory PSA value 0.2 ng/mL or greater. Values obtained with different assay methods or kits cannot be used interchangeably. Results cannot be interpreted as absolute evidence of the presence or absence of malignant disease.      Assessment/Plan   ICD-9-CM ICD-10-CM   1. Paroxysmal atrial fibrillation - rate controlled on amiodarone; eliquis lifetime for anticoagulation 427.31 I48.0 CMP     Lipid Panel     CBC with Differential  2. Elevated PSA - questionable etiology 790.93 R97.2 PSA  3. Essential hypertension - stable on meds 401.9 I10 CMP  4. Chronic back pain 724.5 M54.9    338.29 G89.29  5. Physical deconditioning - due to #4 799.3 R53.81   6. Long term current use of anticoagulant therapy  - eliquis V58.61 Z79.01 CBC with Differential  7.      Tobacco cessation counseling > 5 minutes  --f/u with cardiology as scheduled. Cont eliquis for lifetime. May need to change eliquis to coumadin but will require close monitoring as he is also taking amiodarone. Check with cardiology regarding stopping eliquis and/or bridging with lovenox for colonoscopy   --continue other medications as ordered  --get Tdap vaccine at local pharmacy- Rx printed  --check fasting labs prior to next appt  --Recommend urology eval for elevated PSA (blood prostate test). He states he will think about it  --Recommend smoking cessation with medication (wellbutrin, zyban, vs chantix)  --RTO in 3 mos for f/u.   Leyton Magoon S. Perlie Gold  [facility] [address] [place], [place] [address] [phone] Office (Wednesdays and Fridays 8 AM - 5 PM) [phone] Cell (Monday-Friday 8 AM - 5 PM)

## 2014-11-29 ENCOUNTER — Ambulatory Visit (INDEPENDENT_AMBULATORY_CARE_PROVIDER_SITE_OTHER): Payer: Medicare HMO | Admitting: Cardiology

## 2014-11-29 ENCOUNTER — Other Ambulatory Visit: Payer: Self-pay | Admitting: *Deleted

## 2014-11-29 ENCOUNTER — Encounter: Payer: Self-pay | Admitting: Cardiology

## 2014-11-29 VITALS — BP 128/68 | HR 77 | Ht 66.0 in | Wt 210.0 lb

## 2014-11-29 DIAGNOSIS — I480 Paroxysmal atrial fibrillation: Secondary | ICD-10-CM

## 2014-11-29 DIAGNOSIS — E669 Obesity, unspecified: Secondary | ICD-10-CM

## 2014-11-29 DIAGNOSIS — I10 Essential (primary) hypertension: Secondary | ICD-10-CM | POA: Diagnosis not present

## 2014-11-29 MED ORDER — ASPIRIN EC 81 MG PO TBEC: 81.0000 mg | DELAYED_RELEASE_TABLET | Freq: Every day | ORAL | Status: DC

## 2014-11-29 NOTE — Patient Instructions (Signed)
Medication Instructions:  Please stop your Eliquis and Amiodarone.  Start Asprin 81 mg a day. Continue all other medications as listed.  Follow-Up: Follow up in 4 months with Dr Marlou Porch.  Please call if you have any symptoms of Atrial Fib.  Thank you for choosing [facility]!!

## 2014-11-29 NOTE — Progress Notes (Signed)
[name]. [address] [place], [place]  [address] Phone: [phone] Fax:  [phone]  Date:  11/29/2014   ID:  [name], DOB [birth year], MRN [id number]  PCP:  [name], NP   History of Present Illness: [name] is a 74 y.o. male hospitalization in September 2015, atrial fibrillation with rapid ventricular response in the setting of infection who converted to normal sinus rhythm on amiodarone, diltiazem, on chronic anticoagulation with Eliquis, normal echocardiogram with bacteremia, transesophageal echocardiogram showing no vegetations, reassuring.   His hospitalization, worse days of his life. Terrible back pain. Currently has osteomyelitis and will complete antibiotic therapy in 2 months. Dr. Johnnye Sima with infectious disease has been seeing him. Physical therapy.  MRI of back septic joints.  He has not had any further episodes of atrial fibrillation. No SOB, no CP. No palpitations.  It is certainly plausible that his atrial fibrillation had an underlying reversible cause which was his severe infectious state in the hospital.  Wt Readings from Last 3 Encounters:  11/29/14 210 lb (95.255 kg)  11/03/14 209 lb (94.802 kg)  09/13/14 213 lb 6.4 oz (96.798 kg)     Past Medical History  Diagnosis Date  . Acute appendicitis with peritoneal abscess 01/28/2013  . Ileus, postoperative 01/28/2013  . Tobacco use disorder 01/28/2013  . Obesity, morbid 01/28/2013  . Shortness of breath   . Essential hypertension 04/16/2014    Past Surgical History  Procedure Laterality Date  . Joint replacement      bilateral knee surgery  . Back surgery  1997, 2003    Dr.Apleton   . Laparoscopic appendectomy N/A 01/25/2013    Procedure: APPENDECTOMY LAPAROSCOPIC;  Surgeon: Ralene Ok, MD;  Location: [facility];  Service: General;  Laterality: N/A;  . Tee without cardioversion N/A 04/07/2014    Procedure: TRANSESOPHAGEAL ECHOCARDIOGRAM (TEE);  Surgeon: Candee Furbish, MD;   Location: [facility];  Service: Cardiovascular;  Laterality: N/A;  . Shoulder surgery Right 2014    Dr.Whitfield     Current Outpatient Prescriptions  Medication Sig Dispense Refill  . hydrALAZINE (APRESOLINE) 25 MG tablet Take one tablet by mouth three times daily for blood pressure 90 tablet 3  . losartan (COZAAR) 50 MG tablet TAKE 1 TABLET BY MOUTH ONCE DAILY FOR BLOOD PRESSURE 30 tablet 5  . Tdap (BOOSTRIX) 5-2.5-18.5 LF-MCG/0.5 injection Inject 0.5 mLs into the muscle once. 0.5 mL 0  . aspirin EC 81 MG tablet Take 1 tablet (81 mg total) by mouth daily. 90 tablet 3   No current facility-administered medications for this visit.    Allergies:   No Known Allergies  Social History:  The patient  reports that he has been smoking Cigars.  He has never used smokeless tobacco. He reports that he does not drink alcohol or use illicit drugs.   No early family history of coronary artery disease  ROS:  Please see the history of present illness.   Denies any syncope, bleeding, orthopnea, PND   All other systems reviewed and negative.   PHYSICAL EXAM: VS:  BP 128/68 mmHg  Pulse 77  Ht '5\' 6"'$  (1.676 m)  Wt 210 lb (95.255 kg)  BMI 33.91 kg/m2 Well nourished, well developed, in no acute distress HEENT: normal, [place]/AT, EOMI Neck: no JVD, normal carotid upstroke, no bruit Cardiac:  normal S1, S2; RRR; no murmur Lungs:  clear to auscultation bilaterally, no wheezing, rhonchi or rales Abd: soft, nontender, no hepatomegaly, no bruitsoverweight Ext:  no edema, 2+ distal pulses Skin: warm and dry GU: deferred Neuro: no focal abnormalities noted, AAO x 3  EKG: 11/29/14-sinus rhythm, 75, vertical axis ,borderline interventricular conduction delay. 08/12/14-normal rhythm, left axis deviation, vertical axis, poor R-wave progression. QTC 473 ms.    Labs: 07/27/14-liver functions normal, creatinine 0.9, hemoglobin 17.6. TSH from 04/01/14 was 0.9.  ASSESSMENT AND PLAN:  1. Paroxysmal atrial  fibrillation-converted on amiodarone during hospitalization in September 2015 in the setting of bacteremia. Ultimately, it is possible that he had a reversible cause for his atrial fibrillation i.e. severe infection. Since he is demonstrated normal sinus rhythm since conversion, we have had discussion about discontinuation of both amiodarone as well as Eliquis. It is not unreasonable given the possible underlying cause of infection. Of course, there is always a risk that he would return in to atrial fibrillation unmonitored and potential stroke risk is present. Lab work reassuring.he will let me know if he feels any symptoms of atrial fibrillation, palpitations.  2. Chronic anticoagulation- discontinued Eliquis. restarting low-dose aspirin.  3. Osteomyelitis-Dr. Hatcher's note reviewed. Antibiotics discontinued . 4. Obesity-continue to encourage weight loss. Risk factor for developing atrial fibrillation once again. 5. Essential hypertension-normal. Well control. 6. Four-month follow-up-we will likely check EKG at that time.  Signed, Candee Furbish, MD [facility]  11/29/2014 8:30 AM

## 2014-11-30 ENCOUNTER — Telehealth: Payer: Self-pay | Admitting: *Deleted

## 2014-11-30 ENCOUNTER — Ambulatory Visit: Payer: Medicare HMO | Admitting: Gastroenterology

## 2014-11-30 NOTE — Telephone Encounter (Signed)
Per pt call - reports he took his AM medications as ordered.  He restarted his ASA this AM and took the Losartan and Hydralazine.  He has not taken any Eliquis.  Approximately 2 hours after taking his meds he has noticed his lips and left eye are swollen.  He reports his lipids feel thick.  Advised to take Benadryl for the swelling.  Also advised to not take any more ASA until after I discuss with Dr Marlou Porch.  Pt denies taking any new medications, has not taken anything different and has not use any new types of products.  Also advised if swelling continues he should report to the ED for evaluation and treatment.  He states understanding and is aware I will call back once I review with Dr Marlou Porch.

## 2014-11-30 NOTE — Telephone Encounter (Signed)
Called back to follow up with patient who states he did take Benadryl about 1 hour ago and feels like he is getting alittle better.  He will continue to monitor the swelling.  He spoke with the pharmacist who instructed him as well not to take any more ASA.  He is aware I will discuss with Dr Marlou Porch in the AM and call him back with further instructions.

## 2014-12-01 NOTE — Telephone Encounter (Signed)
Instructed pt to remain off ASA and report any further swelling.  He states understanding.

## 2014-12-01 NOTE — Telephone Encounter (Signed)
I am fine with him stopping ASA. Thanks Candee Furbish, MD

## 2014-12-10 ENCOUNTER — Telehealth: Payer: Self-pay

## 2014-12-10 NOTE — Telephone Encounter (Signed)
Patient would like to know if it is ok for him to take Prosvent (dietary supplement for prostate health). Patient seen an infomercial about this and placed an order for it, the infomercial indicates to contact your doctor/provider before taking.  Please advise

## 2014-12-14 NOTE — Telephone Encounter (Signed)
Patient took medication/supplement to the pharmacy. Pharmacist keyed supplement in and it does not interact with any medications. Jessica aware.

## 2014-12-14 NOTE — Telephone Encounter (Signed)
Will need to bring bottle to next appt and we will look at this at that time

## 2014-12-24 ENCOUNTER — Other Ambulatory Visit: Payer: Self-pay | Admitting: Nurse Practitioner

## 2014-12-27 ENCOUNTER — Other Ambulatory Visit: Payer: Self-pay | Admitting: *Deleted

## 2014-12-27 MED ORDER — HYDRALAZINE HCL 25 MG PO TABS: ORAL_TABLET | ORAL | Status: DC

## 2014-12-27 NOTE — Telephone Encounter (Signed)
Patient called and requested refill to be faxed to pharmacy. 

## 2015-01-25 ENCOUNTER — Other Ambulatory Visit: Payer: Medicare HMO

## 2015-01-27 ENCOUNTER — Other Ambulatory Visit: Payer: Self-pay

## 2015-01-27 DIAGNOSIS — I10 Essential (primary) hypertension: Secondary | ICD-10-CM

## 2015-01-27 DIAGNOSIS — I480 Paroxysmal atrial fibrillation: Secondary | ICD-10-CM

## 2015-01-28 ENCOUNTER — Other Ambulatory Visit: Payer: Medicare HMO

## 2015-01-28 DIAGNOSIS — I480 Paroxysmal atrial fibrillation: Secondary | ICD-10-CM

## 2015-01-28 DIAGNOSIS — I10 Essential (primary) hypertension: Secondary | ICD-10-CM

## 2015-01-29 LAB — CBC WITH DIFFERENTIAL/PLATELET
BASOS ABS: 0 10*3/uL (ref 0.0–0.2)
Basos: 0 %
EOS (ABSOLUTE): 0.1 10*3/uL (ref 0.0–0.4)
Eos: 2 %
HEMATOCRIT: 50.3 % (ref 37.5–51.0)
Hemoglobin: 18 g/dL — ABNORMAL HIGH (ref 12.6–17.7)
Immature Grans (Abs): 0 10*3/uL (ref 0.0–0.1)
Immature Granulocytes: 0 %
LYMPHS: 23 %
Lymphocytes Absolute: 1.7 10*3/uL (ref 0.7–3.1)
MCH: 33.6 pg — ABNORMAL HIGH (ref 26.6–33.0)
MCHC: 35.8 g/dL — ABNORMAL HIGH (ref 31.5–35.7)
MCV: 94 fL (ref 79–97)
Monocytes Absolute: 0.4 10*3/uL (ref 0.1–0.9)
Monocytes: 6 %
Neutrophils Absolute: 5.2 10*3/uL (ref 1.4–7.0)
Neutrophils: 69 %
PLATELETS: 233 10*3/uL (ref 150–379)
RBC: 5.36 x10E6/uL (ref 4.14–5.80)
RDW: 14.2 % (ref 12.3–15.4)
WBC: 7.4 10*3/uL (ref 3.4–10.8)

## 2015-01-29 LAB — COMPREHENSIVE METABOLIC PANEL
A/G RATIO: 1.8 (ref 1.1–2.5)
ALT: 18 IU/L (ref 0–44)
AST: 21 IU/L (ref 0–40)
Albumin: 4.2 g/dL (ref 3.5–4.8)
Alkaline Phosphatase: 80 IU/L (ref 39–117)
BILIRUBIN TOTAL: 0.5 mg/dL (ref 0.0–1.2)
BUN / CREAT RATIO: 23 — AB (ref 10–22)
BUN: 19 mg/dL (ref 8–27)
CO2: 21 mmol/L (ref 18–29)
Calcium: 9.2 mg/dL (ref 8.6–10.2)
Chloride: 101 mmol/L (ref 97–108)
Creatinine, Ser: 0.84 mg/dL (ref 0.76–1.27)
GFR calc non Af Amer: 88 mL/min/{1.73_m2} (ref >59)
GFR, EST AFRICAN AMERICAN: 102 mL/min/{1.73_m2} (ref >59)
Globulin, Total: 2.3 g/dL (ref 1.5–4.5)
Glucose: 88 mg/dL (ref 65–99)
Potassium: 4.5 mmol/L (ref 3.5–5.2)
Sodium: 140 mmol/L (ref 134–144)
Total Protein: 6.5 g/dL (ref 6.0–8.5)

## 2015-01-29 LAB — LIPID PANEL
Chol/HDL Ratio: 3.9 ratio units (ref 0.0–5.0)
Cholesterol, Total: 159 mg/dL (ref 100–199)
HDL: 41 mg/dL (ref >39)
LDL CALC: 86 mg/dL (ref 0–99)
TRIGLYCERIDES: 160 mg/dL — AB (ref 0–149)
VLDL Cholesterol Cal: 32 mg/dL (ref 5–40)

## 2015-02-01 ENCOUNTER — Encounter: Payer: Self-pay | Admitting: Nurse Practitioner

## 2015-02-01 ENCOUNTER — Ambulatory Visit (INDEPENDENT_AMBULATORY_CARE_PROVIDER_SITE_OTHER): Payer: Medicare HMO | Admitting: Nurse Practitioner

## 2015-02-01 VITALS — BP 130/70 | HR 85 | Temp 97.9°F | Resp 20 | Ht 66.0 in | Wt 211.0 lb

## 2015-02-01 DIAGNOSIS — I10 Essential (primary) hypertension: Secondary | ICD-10-CM

## 2015-02-01 DIAGNOSIS — E781 Pure hyperglyceridemia: Secondary | ICD-10-CM | POA: Diagnosis not present

## 2015-02-01 DIAGNOSIS — G8929 Other chronic pain: Secondary | ICD-10-CM

## 2015-02-01 DIAGNOSIS — M549 Dorsalgia, unspecified: Secondary | ICD-10-CM | POA: Diagnosis not present

## 2015-02-01 DIAGNOSIS — Z720 Tobacco use: Secondary | ICD-10-CM

## 2015-02-01 DIAGNOSIS — I480 Paroxysmal atrial fibrillation: Secondary | ICD-10-CM

## 2015-02-01 DIAGNOSIS — R972 Elevated prostate specific antigen [PSA]: Secondary | ICD-10-CM

## 2015-02-01 DIAGNOSIS — R5381 Other malaise: Secondary | ICD-10-CM

## 2015-02-01 DIAGNOSIS — F17200 Nicotine dependence, unspecified, uncomplicated: Secondary | ICD-10-CM

## 2015-02-01 NOTE — Patient Instructions (Signed)
Set an obtainable goal to cut back on cigarettes, goal should be to ultimately stop eventually  Will add PSA  Follow up in 6 months

## 2015-02-01 NOTE — Progress Notes (Signed)
Patient ID: [name], male   DOB: 13-Oct-1940, 74 y.o.   MRN: [id number]    PCP: [name], NP  Allergies  Allergen Reactions  . Asa [Aspirin] Swelling    Chief Complaint  Patient presents with  . Medical Management of Chronic Issues    3 month follow-up, labs printed     HPI: Patient is a 74 y.o. male seen in the office today for routine follow up. Pt with a pmh of a fib, htn, smoker, back pain.  Was felt a fib was due to bacteremia and eliquis and amiodarone was stopped. ASA started but then he started swelling up around his lips and eyes. Stopped and allergy added.  Pt has been off antibiotics since march for osteomyelitis, back pain better.  Still smoking but has cut back, now down to 1 pack a day or less.  Just starting doing a few floor exercise  Mowing the yard, weed eat, playing golf. Gets worn out easily  Attempts to eat heart healthy.   Review of Systems:  Review of Systems  Constitutional: Negative for activity change, appetite change, fatigue and unexpected weight change.  HENT: Negative for congestion.   Eyes: Negative.   Respiratory: Negative for cough, shortness of breath and wheezing.   Cardiovascular: Negative for chest pain, palpitations and leg swelling.  Gastrointestinal: Negative for abdominal pain, diarrhea and constipation.  Genitourinary: Positive for frequency (drinks a lot of coffee). Negative for dysuria and difficulty urinating.  Musculoskeletal: Positive for myalgias, arthralgias and gait problem.  Skin: Negative for color change and wound.  Neurological: Positive for weakness. Negative for dizziness.  Psychiatric/Behavioral: Negative for behavioral problems, confusion and agitation.    Past Medical History  Diagnosis Date  . Acute appendicitis with peritoneal abscess 01/28/2013  . Ileus, postoperative 01/28/2013  . Tobacco use disorder 01/28/2013  . Obesity, morbid 01/28/2013  . Shortness of breath   . Essential hypertension  04/16/2014   Past Surgical History  Procedure Laterality Date  . Joint replacement      bilateral knee surgery  . Back surgery  1997, 2003    Dr.Apleton   . Laparoscopic appendectomy N/A 01/25/2013    Procedure: APPENDECTOMY LAPAROSCOPIC;  Surgeon: Ralene Ok, MD;  Location: [facility];  Service: General;  Laterality: N/A;  . Tee without cardioversion N/A 04/07/2014    Procedure: TRANSESOPHAGEAL ECHOCARDIOGRAM (TEE);  Surgeon: Candee Furbish, MD;  Location: [facility];  Service: Cardiovascular;  Laterality: N/A;  . Shoulder surgery Right 2014    Dr.Whitfield    Social History:   reports that he has been smoking Cigars.  He has never used smokeless tobacco. He reports that he does not drink alcohol or use illicit drugs.  Family History  Problem Relation Age of Onset  . Irritable bowel syndrome Mother     Medications: Patient's Medications  New Prescriptions   No medications on file  Previous Medications   HYDRALAZINE (APRESOLINE) 25 MG TABLET    Take one tablet by mouth three times daily for blood pressure   LOSARTAN (COZAAR) 50 MG TABLET    TAKE 1 TABLET BY MOUTH ONCE DAILY FOR BLOOD PRESSURE   TDAP (BOOSTRIX) 5-2.5-18.5 LF-MCG/0.5 INJECTION    Inject 0.5 mLs into the muscle once.  Modified Medications   No medications on file  Discontinued Medications   No medications on file     Physical Exam:  Filed Vitals:   02/01/15 1035  BP: 130/70  Pulse: 85  Temp: 97.9 F (36.6 C)  TempSrc: Oral  Resp: 20  Height: '5\' 6"'$  (1.676 m)  Weight: 211 lb (95.709 kg)  SpO2: 95%    Physical Exam  Constitutional: He is oriented to person, place, and time. He appears well-developed and well-nourished. No distress.  HENT:  Head: Normocephalic and atraumatic.  Mouth/Throat: Oropharynx is clear and moist. No oropharyngeal exudate.  Eyes: Conjunctivae and EOM are normal. Pupils are equal, round, and reactive to light.  Neck: Normal range of motion. Neck supple.  Cardiovascular: Normal  rate, regular rhythm and normal heart sounds.   Pulmonary/Chest: Effort normal and breath sounds normal.  Abdominal: Soft. Bowel sounds are normal.  Genitourinary: Penis normal.  Musculoskeletal: He exhibits no edema or tenderness.  Neurological: He is alert and oriented to person, place, and time.  Skin: Skin is warm and dry. He is not diaphoretic.  Psychiatric: He has a normal mood and affect.    Labs reviewed: Basic Metabolic Panel:  Recent Labs  04/01/14 2018  04/03/14 0045 04/04/14 0309  04/07/14 0258  04/11/14 0450  04/15/14 0450 05/05/14 07/27/14 0837 01/28/15 0828  NA  --   < > 132* 138  < > 140  < > 142  < > 141 139 143 140  K  --   < > 3.7 3.2*  < > 4.0  < > 3.7  < > 3.7 4.4 4.5 4.5  CL  --   < > 99 101  < > 104  < > 101  < > 100  --  100 101  CO2  --   < > 20 24  < > 28  < > 30  < > 31  --  24 21  GLUCOSE  --   < > 137* 121*  < > 111*  < > 105*  < > 115*  --  93 88  BUN  --   < > 23 20  < > 19  < > 14  < > '16 17 12 19  '$ CREATININE 0.80  < > 0.76 0.65  < > 0.65  < > 0.58  < > 0.69 0.8 0.90 0.84  CALCIUM  --   < > 7.7* 7.8*  < > 8.3*  < > 8.5  < > 8.6  --  9.7 9.2  MG  --   < > 2.2 2.1  --  2.2  --  2.0  --   --   --   --   --   PHOS  --   --  1.6* 2.0*  --   --   --   --   --   --   --   --   --   TSH 0.976  --   --   --   --   --   --   --   --   --   --   --   --   < > = values in this interval not displayed. Liver Function Tests:  Recent Labs  04/06/14 0302 04/07/14 0258 04/11/14 0450 05/05/14 07/27/14 0837 01/28/15 0828  AST 41* 40* '27 23 25 21  '$ ALT 42 39 '26 28 28 18  '$ ALKPHOS 96 117 101 107 88 80  BILITOT 0.8 0.9 0.6  --  0.3 0.5  PROT 5.6* 5.7* 6.2  --  7.4 6.5  ALBUMIN 2.2* 2.3* 2.4*  --   --   --    No results for input(s): LIPASE, AMYLASE in the last 8760 hours.  No results for input(s): AMMONIA in the last 8760 hours. CBC:  Recent Labs  04/11/14 0450 04/14/14 0540 05/05/14 07/27/14 0837 01/28/15 0828  WBC 12.2* 11.0* 8.7 6.8 7.4    NEUTROABS 10.3*  --   --  4.6 5.2  HGB 16.1 15.2 14.4 17.6  --   HCT 47.6 45.3 43 53.2* 50.3  MCV 96.9 95.2  --  100*  --   PLT 384 432* 276 236  --    Lipid Panel:  Recent Labs  07/27/14 0837 01/28/15 0828  CHOL 181 159  HDL 51 41  LDLCALC 99 86  TRIG 154* 160*  CHOLHDL 3.5 3.9   TSH:  Recent Labs  04/01/14 2018  TSH 0.976   A1C: Lab Results  Component Value Date   HGBA1C 5.3 07/27/2014     Assessment/Plan  1. Essential hypertension -stable at this time, to cont cozaar and hydralazine  2. Chronic back pain -mild at this time, to increase strength with exercises  3. Physical deconditioning -to increase activity slowly, progression of activity to allow for better endurance   4. Smoker -discussed cessation, setting obtainable goals  5. Elevated PSA -taking OTC supplement for prostate, will follow up PSA  -does not wish to go to urology at this time  6. paroxysmal a fib  Reviewed cardiology notes, thought to be due to bacteriemia, no increase HR or palpitations. Off all anticoagulation and rate control medications.   7. Hypertriglyceridemia Discussed labs, diet modifications, and exercise -will monitor   Follow up in 6 months   Billyjoe Go K. Harle Battiest  [facility] [phone] 8 am - 5 pm) [phone] (after hours)

## 2015-02-03 LAB — SPECIMEN STATUS REPORT

## 2015-02-03 LAB — PSA: PROSTATE SPECIFIC AG, SERUM: 5.6 ng/mL — AB (ref 0.0–4.0)

## 2015-02-25 ENCOUNTER — Telehealth: Payer: Self-pay

## 2015-02-25 ENCOUNTER — Emergency Department (HOSPITAL_COMMUNITY): Payer: Medicare HMO

## 2015-02-25 ENCOUNTER — Encounter (HOSPITAL_COMMUNITY): Payer: Self-pay

## 2015-02-25 ENCOUNTER — Emergency Department (HOSPITAL_COMMUNITY)
Admission: EM | Admit: 2015-02-25 | Discharge: 2015-02-25 | Disposition: A | Discharge status: Home / Self Care | Payer: Medicare HMO | Attending: Emergency Medicine | Admitting: Emergency Medicine

## 2015-02-25 DIAGNOSIS — Z79899 Other long term (current) drug therapy: Secondary | ICD-10-CM | POA: Insufficient documentation

## 2015-02-25 DIAGNOSIS — Z792 Long term (current) use of antibiotics: Secondary | ICD-10-CM | POA: Insufficient documentation

## 2015-02-25 DIAGNOSIS — L02214 Cutaneous abscess of groin: Secondary | ICD-10-CM | POA: Insufficient documentation

## 2015-02-25 DIAGNOSIS — Z720 Tobacco use: Secondary | ICD-10-CM | POA: Insufficient documentation

## 2015-02-25 DIAGNOSIS — I10 Essential (primary) hypertension: Secondary | ICD-10-CM | POA: Diagnosis not present

## 2015-02-25 LAB — BASIC METABOLIC PANEL
Anion gap: 10 (ref 5–15)
BUN: 14 mg/dL (ref 6–20)
CHLORIDE: 103 mmol/L (ref 101–111)
CO2: 23 mmol/L (ref 22–32)
Calcium: 9.1 mg/dL (ref 8.9–10.3)
Creatinine, Ser: 0.87 mg/dL (ref 0.61–1.24)
GFR calc Af Amer: >60 mL/min (ref >60)
GFR calc non Af Amer: >60 mL/min (ref >60)
Glucose, Bld: 113 mg/dL — ABNORMAL HIGH (ref 65–99)
Potassium: 4 mmol/L (ref 3.5–5.1)
Sodium: 136 mmol/L (ref 135–145)

## 2015-02-25 LAB — CBC
HCT: 51.9 % (ref 39.0–52.0)
HEMOGLOBIN: 18 g/dL — AB (ref 13.0–17.0)
MCH: 33.6 pg (ref 26.0–34.0)
MCHC: 34.7 g/dL (ref 30.0–36.0)
MCV: 96.8 fL (ref 78.0–100.0)
PLATELETS: 204 10*3/uL (ref 150–400)
RBC: 5.36 MIL/uL (ref 4.22–5.81)
RDW: 14.6 % (ref 11.5–15.5)
WBC: 10.5 10*3/uL (ref 4.0–10.5)

## 2015-02-25 MED ORDER — IOHEXOL 300 MG/ML  SOLN
100.0000 mL | Freq: Once | INTRAMUSCULAR | Status: AC | PRN
  Administered 2015-02-25: 100 mL via INTRAVENOUS

## 2015-02-25 MED ORDER — OXYCODONE-ACETAMINOPHEN 5-325 MG PO TABS: 1.0000 | ORAL_TABLET | Freq: Four times a day (QID) | ORAL | Status: DC | PRN

## 2015-02-25 MED ORDER — LIDOCAINE-EPINEPHRINE (PF) 2 %-1:200000 IJ SOLN
10.0000 mL | Freq: Once | INTRAMUSCULAR | Status: AC
  Administered 2015-02-25: 10 mL via INTRADERMAL
  Filled 2015-02-25: qty 20

## 2015-02-25 NOTE — Discharge Instructions (Signed)
Return to the ED with any concerns including fever, increased redness around wound, increased pain, vomiting, decreased level of alertness/lethargy, or any other alarming symptoms  The following findings were on the CT scan obtained- you should show these results to your primary care doctor and discuss any further workup recommended with them:   ---18 mm smoothly circumscribed left lower lobe lung nodule which was not present on the prior CT. The etiology is unclear. Given that this is new, neoplastic disease is in the differential diagnosis and biopsy should be considered.  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 Ectatic abdominal aorta at risk for aneurysm development. Recommend followup by ultrasound in 5 years. This recommendation follows ACR consensus guidelines: White Paper of the ACR Incidental Findings Committee II on Vascular Findings. J Am Coll Radiol 2013; 10:789-794.

## 2015-02-25 NOTE — ED Notes (Signed)
Pt alert x4 respirations easy non labored.  

## 2015-02-25 NOTE — Telephone Encounter (Signed)
Patient walk-in asking for appt has a cyst in right groin area, hurts to walk. Started this week. Only have one doctor in today and she is full. In the 70's had these a lot, Dr. Annamaria Boots removed them. Stopped at [facility] today, they couldn't see him. Told him the best I could do is give him an appt next week, he can't . Referred him to [facility], they should be able to see him.

## 2015-02-25 NOTE — ED Notes (Signed)
Patient transported to CT 

## 2015-02-25 NOTE — ED Provider Notes (Signed)
CSN: [id number]     Arrival date & time 02/25/15  5176 History   First MD Initiated Contact with Patient 02/25/15 0930     Chief Complaint  Patient presents with  . Abscess     (Consider location/radiation/quality/duration/timing/severity/associated sxs/prior Treatment) HPI  Pt presenting with abscess to right groin.  He noted the area one week ago but states for the past 3 days the areas has been more painful.  No fever/chills.  No drainage from area.  Pain is worse with movement and palpation.  No involvement of scrotum, no testicular pain. No change in urination.  There are no other associated systemic symptoms, there are no other alleviating or modifying factors.   Past Medical History  Diagnosis Date  . Acute appendicitis with peritoneal abscess 01/28/2013  . Ileus, postoperative 01/28/2013  . Tobacco use disorder 01/28/2013  . Obesity, morbid 01/28/2013  . Shortness of breath   . Essential hypertension 04/16/2014  . Elevated PSA    Past Surgical History  Procedure Laterality Date  . Joint replacement      bilateral knee surgery  . Back surgery  1997, 2003    Dr.Apleton   . Laparoscopic appendectomy N/A 01/25/2013    Procedure: APPENDECTOMY LAPAROSCOPIC;  Surgeon: Ralene Ok, MD;  Location: [facility];  Service: General;  Laterality: N/A;  . Tee without cardioversion N/A 04/07/2014    Procedure: TRANSESOPHAGEAL ECHOCARDIOGRAM (TEE);  Surgeon: Candee Furbish, MD;  Location: [facility];  Service: Cardiovascular;  Laterality: N/A;  . Shoulder surgery Right 2014    Dr.Whitfield    Family History  Problem Relation Age of Onset  . Irritable bowel syndrome Mother    Social History  Substance Use Topics  . Smoking status: Current Every Day Smoker -- 1.00 packs/day for 50 years    Types: Cigars  . Smokeless tobacco: Never Used     Comment: 1 pack of less  . Alcohol Use: No    Review of Systems  ROS reviewed and all otherwise negative except for mentioned in HPI    [place] Medications   Prior to Admission medications   Medication Sig Start Date End Date Taking? Authorizing Provider  doxycycline (VIBRAMYCIN) 100 MG capsule Take 100 mg by mouth 2 (two) times daily. 02/21/15  Yes Historical Provider, MD  hydrALAZINE (APRESOLINE) 25 MG tablet Take one tablet by mouth three times daily for blood pressure Patient taking differently: Take 25 mg by mouth 3 (three) times daily. Take one tablet by mouth three times daily for blood pressure 12/27/14  Yes [name], NP  losartan (COZAAR) 50 MG tablet TAKE 1 TABLET BY MOUTH ONCE DAILY FOR BLOOD PRESSURE 10/25/14  Yes [name], NP  oxyCODONE-acetaminophen (PERCOCET/ROXICET) 5-325 MG per tablet Take 1-2 tablets by mouth every 6 (six) hours as needed for severe pain. 02/25/15   Alfonzo Beers, MD  Tdap Durwin Reges) 5-2.5-18.5 LF-MCG/0.5 injection Inject 0.5 mLs into the muscle once. Patient not taking: Reported on 02/01/2015 11/03/14   Gildardo Cranker, DO   BP 125/73 mmHg  Pulse 90  Temp(Src) 98.6 F (37 C) (Oral)  Resp 20  Wt 211 lb (95.709 kg)  SpO2 98%  Vitals reviewed Physical Exam  Physical Examination: General appearance - alert, well appearing, and in no distress Mental status - alert, oriented to person, place, and time Eyes - no conjunctival injection, no scleral icterus Mouth - mucous membranes moist, pharynx normal without lesions Chest - clear to auscultation, no wheezes, rales or  rhonchi, symmetric air entry Heart - normal rate, regular rhythm, normal S1, S2, no murmurs, rubs, clicks or gallops Abdomen - soft, nontender, nondistended, no masses or organomegaly GU Male - no penile lesions or discharge, no testicular masses or tenderness, no hernias, abscess with induraion and fluctuance in right inguinal region Extremities - peripheral pulses normal, no pedal edema, no clubbing or cyanosis Skin - normal coloration and turgor, no rashes- other than abscess as described, no surrounding  erythema  ED Course  Procedures (including critical care time)  INCISION AND DRAINAGE Performed by: Threasa Beards Consent: Verbal consent obtained. Risks and benefits: risks, benefits and alternatives were discussed Type: abscess  Body area: right inguinal  Anesthesia: local infiltration  Incision was made with a scalpel.  Local anesthetic: lidocaine 2% w epinephrine  Anesthetic total: 5 ml  Complexity: complex Blunt dissection to break up loculations  Drainage: purulent  Drainage amount: moderate  Packing material: 1/4 in iodoform gauze  Patient tolerance: Patient tolerated the procedure well with no immediate complications.   Labs Review Labs Reviewed  CBC - Abnormal; Notable for the following:    Hemoglobin 18.0 (*)    All other components within normal limits  BASIC METABOLIC PANEL - Abnormal; Notable for the following:    Glucose, Bld 113 (*)    All other components within normal limits    Imaging Review Ct Abdomen Pelvis W Contrast  02/25/2015   CLINICAL DATA:  Pt c/o raised, red, warm area to right groin x1 week. H/o appendectomy x 1 year ago. No NVD, no fever. No h/o bowel diseases. 172m omni 300 used. Lead BB placed on right groin to mark area of interest. Additional images taken to include area of interest after initial scan.  EXAM: CT ABDOMEN AND PELVIS WITH CONTRAST  TECHNIQUE: Multidetector CT imaging of the abdomen and pelvis was performed using the standard protocol following bolus administration of intravenous contrast.  CONTRAST:  1030mOMNIPAQUE IOHEXOL 300 MG/ML  SOLN  COMPARISON:  CT, 04/01/2014  FINDINGS: Lung bases: Oval, 18 mm, fairly homogeneous and smoothly circumscribed left lower lobe nodule, not present on the prior CT. No lung consolidation or edema. No pleural effusion. Heart normal in size.  Liver: 2.2 cm hypo attenuating lesion in the left lobe, stable, consistent with a cyst. No other liver abnormality.  Spleen, gallbladder, pancreas,  adrenal glands:  Unremarkable.  Kidneys, ureters, bladder: 7 mm cyst from the upper pole of the right kidney. Prominent right renal vascular calcifications, stable. No other renal masses. No hydronephrosis. Normal ureters. Bladder is unremarkable.  Lymph nodes:  No enlarged lymph nodes.  Ascites: None.  Vascular: Atherosclerotic calcifications along the aorta and its branch vessels. Mild ectasia. Aorta measures 2.5 cm in greatest transverse dimension.  Gastrointestinal: Stomach, small bowel are unremarkable. Few small left colon diverticula. No diverticulitis. No other colonic abnormality.  Right inguinal region: There is a small fluid collection with enhancing margins and adjacent hazy inflammatory change in the superficial subcutaneous soft tissues of the right groin measuring 17 x 14 x 16 mm.  Musculoskeletal: Advanced degenerative changes noted of the lumbar spine with a levoscoliosis. No osteoblastic or osteolytic lesions. Subchondral cystic change noted along the superior left acetabulum.  IMPRESSION: 1. Small fluid collection with enhancing wall and adjacent inflammation in the superficial subcutaneous soft tissues of the right groin. This measures 17 mm in greatest dimension consistent with small subcutaneous abscess. 2. No other acute findings. 3. 18 mm smoothly circumscribed left lower lobe nodule which  was not present on the prior CT. The etiology is unclear. Given that this is new, neoplastic disease is in the differential diagnosis and biopsy should be considered. 4. 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 Ectatic abdominal aorta at risk for aneurysm development. Recommend followup by ultrasound in 5 years. This recommendation follows ACR consensus guidelines: White Paper of the ACR Incidental Findings Committee II on Vascular Findings. J Am Coll Radiol 2013; 10:789-794.   Electronically Signed   By: Lajean Manes M.D.   On: 02/25/2015 11:15   I, Threasa Beards,  personally reviewed and evaluated these images and lab results as part of my medical decision-making.   EKG Interpretation None      MDM   Final diagnoses:  Inguinal abscess   Pt presenting with c/o right groin abscess.  CT scan obtained to ensure this was not a deeper infection.  CT reflect superficial abscess this was drained by me.  Other CT scan findings shared with patient and instructed to d/w primary care doctor. Pt instructed to have wound rechecked in 48 hours.  Discharged with strict return precautions.  Pt agreeable with plan.   Alfonzo Beers, MD 02/25/15 1324

## 2015-02-25 NOTE — ED Notes (Signed)
Pt c/o raised, red, warm area to right groin x1 week

## 2015-02-28 ENCOUNTER — Encounter: Payer: Self-pay | Admitting: Internal Medicine

## 2015-02-28 ENCOUNTER — Ambulatory Visit (INDEPENDENT_AMBULATORY_CARE_PROVIDER_SITE_OTHER): Payer: Medicare HMO | Admitting: Internal Medicine

## 2015-02-28 VITALS — BP 132/58 | HR 84 | Temp 98.8°F | Resp 20 | Ht 66.0 in | Wt 210.0 lb

## 2015-02-28 DIAGNOSIS — Z720 Tobacco use: Secondary | ICD-10-CM

## 2015-02-28 DIAGNOSIS — R911 Solitary pulmonary nodule: Secondary | ICD-10-CM | POA: Diagnosis not present

## 2015-02-28 DIAGNOSIS — L02214 Cutaneous abscess of groin: Secondary | ICD-10-CM

## 2015-02-28 NOTE — Patient Instructions (Signed)
Continue to cover right groin wound with dry dressing. Return in 1 wk to see Janett Billow.   Complete course of doxycycline as planned.  I recommend you work on quitting smoking.  I have put in a referral to pulmonary about the spot in your left lower lung.

## 2015-02-28 NOTE — Progress Notes (Signed)
Patient ID: [name], male   DOB: 08/13/1940, 74 y.o.   MRN: [id number]   Location:  [facility] Goals of Care: Advanced Directive information Does patient have an advance directive?: No, Would patient like information on creating an advanced directive?: Yes - Educational materials given   Chief Complaint  Patient presents with  . Acute Visit    ER follow up for inguinal abscess that was opened and packed with gauze    HPI: Patient is a 74 y.o. white male seen in the office today for an acute visit after an ED visit for right groin swollen, tender mass.  This was an abcess that was incised, drained and packed in the ED.    Feels abscess area is better.  He cannot see it well.  Has had to use a mirror.  Each time he went to change the dressing, some of the iodoform gauze would come out and now the last time, it all came out this morning.  Has been on doxycycline since before 8/12 for his facial lesion that derm drained and it was continued by the ED to treat this abscess.    ED note also indicates that his primary care office needs to f/u on his CT abd/pelvis new finding which is 1.8cm circumscribed nodule in his left lower lobe new since 1/16.  Discussed with patient today and he agrees with pulmonary referral.  Continues to smoke.  Tells me about his stay at [facility] after hospitalization for osteomyelitis.  Review of Systems:  Review of Systems  Constitutional: Negative for fever and chills.  Respiratory: Positive for cough. Negative for shortness of breath.   Cardiovascular: Negative for chest pain.  Musculoskeletal: Positive for back pain.  Skin:       Right groin abscess s/p drainage    Past Medical History  Diagnosis Date  . Acute appendicitis with peritoneal abscess 01/28/2013  . Ileus, postoperative 01/28/2013  . Tobacco use disorder 01/28/2013  . Obesity, morbid 01/28/2013  . Shortness of breath   . Essential  hypertension 04/16/2014  . Elevated PSA     Past Surgical History  Procedure Laterality Date  . Joint replacement      bilateral knee surgery  . Back surgery  1997, 2003    Dr.Apleton   . Laparoscopic appendectomy N/A 01/25/2013    Procedure: APPENDECTOMY LAPAROSCOPIC;  Surgeon: Ralene Ok, MD;  Location: [facility];  Service: General;  Laterality: N/A;  . Tee without cardioversion N/A 04/07/2014    Procedure: TRANSESOPHAGEAL ECHOCARDIOGRAM (TEE);  Surgeon: Candee Furbish, MD;  Location: [facility];  Service: Cardiovascular;  Laterality: N/A;  . Shoulder surgery Right 2014    Dr.Whitfield     Allergies  Allergen Reactions  . Asa [Aspirin] Swelling   Medications: Patient's Medications  New Prescriptions   No medications on file  Previous Medications   DOXYCYCLINE (VIBRAMYCIN) 100 MG CAPSULE    Take 100 mg by mouth 2 (two) times daily.   HYDRALAZINE (APRESOLINE) 25 MG TABLET    Take one tablet by mouth three times daily for blood pressure   LOSARTAN (COZAAR) 50 MG TABLET    TAKE 1 TABLET BY MOUTH ONCE DAILY FOR BLOOD PRESSURE   OXYCODONE-ACETAMINOPHEN (PERCOCET/ROXICET) 5-325 MG PER TABLET    Take 1-2 tablets by mouth every 6 (six) hours as needed for severe pain.   TDAP (BOOSTRIX) 5-2.5-18.5 LF-MCG/0.5 INJECTION    Inject 0.5 mLs into the muscle once.  Modified Medications  No medications on file  Discontinued Medications   No medications on file    Physical Exam: Filed Vitals:   02/28/15 1123  BP: 132/58  Pulse: 84  Temp: 98.8 F (37.1 C)  TempSrc: Oral  Resp: 20  Height: '5\' 6"'$  (1.676 m)  Weight: 210 lb (95.255 kg)  SpO2: 95%   Physical Exam  Constitutional:  Obese white male  Pulmonary/Chest: Effort normal.  Coarse rhonchi, dry cough  Abdominal:  Abdominal obesity  Skin:  Right groin with pencil eraser sized open wound with pale yellow drainage; odor of yeast present when dressing removed; was repacked with iodoform gauze and covered    Labs  reviewed: Basic Metabolic Panel:  Recent Labs  04/01/14 2018  04/03/14 0045 04/04/14 0309  04/07/14 0258  04/11/14 0450  07/27/14 0837 01/28/15 0828 02/25/15 0950  NA  --   < > 132* 138  < > 140  < > 142  < > 143 140 136  K  --   < > 3.7 3.2*  < > 4.0  < > 3.7  < > 4.5 4.5 4.0  CL  --   < > 99 101  < > 104  < > 101  < > 100 101 103  CO2  --   < > 20 24  < > 28  < > 30  < > '24 21 23  '$ GLUCOSE  --   < > 137* 121*  < > 111*  < > 105*  < > 93 88 113*  BUN  --   < > 23 20  < > 19  < > 14  < > '12 19 14  '$ CREATININE 0.80  < > 0.76 0.65  < > 0.65  < > 0.58  < > 0.90 0.84 0.87  CALCIUM  --   < > 7.7* 7.8*  < > 8.3*  < > 8.5  < > 9.7 9.2 9.1  MG  --   < > 2.2 2.1  --  2.2  --  2.0  --   --   --   --   PHOS  --   --  1.6* 2.0*  --   --   --   --   --   --   --   --   TSH 0.976  --   --   --   --   --   --   --   --   --   --   --   < > = values in this interval not displayed. Liver Function Tests:  Recent Labs  04/06/14 0302 04/07/14 0258 04/11/14 0450 05/05/14 07/27/14 0837 01/28/15 0828  AST 41* 40* '27 23 25 21  '$ ALT 42 39 '26 28 28 18  '$ ALKPHOS 96 117 101 107 88 80  BILITOT 0.8 0.9 0.6  --  0.3 0.5  PROT 5.6* 5.7* 6.2  --  7.4 6.5  ALBUMIN 2.2* 2.3* 2.4*  --   --   --    No results for input(s): LIPASE, AMYLASE in the last 8760 hours. No results for input(s): AMMONIA in the last 8760 hours. CBC:  Recent Labs  04/11/14 0450 04/14/14 0540  05/05/14 07/27/14 0837 01/28/15 0828 02/25/15 0950  WBC 12.2* 11.0*  < > 8.7 6.8 7.4 10.5  NEUTROABS 10.3*  --   --   --  4.6 5.2  --   HGB 16.1 15.2  --  14.4 17.6  --  18.0*  HCT 47.6 45.3  --  43 53.2* 50.3 51.9  MCV 96.9 95.2  --   --  100*  --  96.8  PLT 384 432*  --  276 236  --  204  < > = values in this interval not displayed. Lipid Panel:  Recent Labs  07/27/14 0837 01/28/15 0828  CHOL 181 159  HDL 51 41  LDLCALC 99 86  TRIG 154* 160*  CHOLHDL 3.5 3.9   Lab Results  Component Value Date   HGBA1C 5.3 07/27/2014     Procedures since last visit: CT abdomen and pelvis w/ contrast:  1. Small fluid collection with enhancing wall and adjacent inflammation in the superficial subcutaneous soft tissues of the right groin. This measures 17 mm in greatest dimension consistent with small subcutaneous abscess. 2. No other acute findings. 3. 18 mm smoothly circumscribed left lower lobe nodule which was not present on the prior CT. The etiology is unclear. Given that this is new, neoplastic disease is in the differential diagnosis and biopsy should be considered. 4. 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 Ectatic abdominal aorta at risk for aneurysm development. Recommend followup by ultrasound in 5 years. This recommendation follows ACR consensus guidelines: White Paper of the ACR Incidental Findings Committee II on Vascular Findings. J Am Coll Radiol 2013; 10:789-794  Assessment/Plan 1. Soft tissue abscess of inguinal region -complete course of doxycycline -maintain packing and change dressing daily, try to keep clean and dry -f/u with Janett Billow in 1 week to reassess healing process -likely will need nystatin cream to use in groin area when wound closed   2. Incidental lung nodule, greater than or equal to 44m -newly seen on CT abd/pelvis this month when not present 7 mos ago in smoker - Ambulatory referral to Pulmonology  3. Tobacco abuse -cessation recommended -has been smoking for more than 40 years - Ambulatory referral to Pulmonology  Labs/tests ordered: Orders Placed This Encounter  Procedures  . Ambulatory referral to Pulmonology    Referral Priority:  Routine    Referral Type:  Consultation    Referral Reason:  Specialty Services Required    Requested Specialty:  Pulmonary Disease    Number of Visits Requested:  1   Next appt: 1 wk with JJanett Billowre: right groin wound  Alekzander Cardell L. Ilanna Deihl, D.O. [facility] [address] [place] [id number] Phone (Mon-Fri 8am-5pm):  [phone] Call:  [phone]& follow prompts after 5pm & weekends Office Phone:  [phone] Fax:  [phone]

## 2015-03-02 ENCOUNTER — Encounter: Payer: Self-pay | Admitting: Pulmonary Disease

## 2015-03-02 ENCOUNTER — Ambulatory Visit: Payer: Medicare HMO | Admitting: Internal Medicine

## 2015-03-02 ENCOUNTER — Telehealth: Payer: Self-pay | Admitting: Pulmonary Disease

## 2015-03-02 ENCOUNTER — Ambulatory Visit (INDEPENDENT_AMBULATORY_CARE_PROVIDER_SITE_OTHER): Payer: Medicare HMO | Admitting: Pulmonary Disease

## 2015-03-02 VITALS — BP 138/74 | HR 93 | Ht 67.0 in | Wt 208.4 lb

## 2015-03-02 DIAGNOSIS — R918 Other nonspecific abnormal finding of lung field: Secondary | ICD-10-CM

## 2015-03-02 DIAGNOSIS — Z720 Tobacco use: Secondary | ICD-10-CM

## 2015-03-02 DIAGNOSIS — F17200 Nicotine dependence, unspecified, uncomplicated: Secondary | ICD-10-CM

## 2015-03-02 NOTE — Progress Notes (Signed)
Subjective:    Patient ID: [name], male    DOB: Apr 11, 1941, 74 y.o.   MRN: [id number]  HPI Patient denies any dyspnea or wheezing. He does experience an intermittent cough which she has attributed to his smoking history. He reports his cough is productive of a minimal amount of intermittent white phlegm. He denies any hemoptysis. He denies any chest pain or pressure. He denies any lymphadenopathy in his neck, groin, or axilla. He denies any dysphagia or odynophagia. He denies any subjective fever, chills, or sweats. He denies any headache, vision changes, or focal numbness, weakness, or tingling.  Review of Systems no nausea, vomiting, or diarrhea. No melena or hematochezia. No dysuria or hematuria. A pertinent 14 point review of systems is negative except as per the history of presenting illness.  Allergies  Allergen Reactions  . Asa [Aspirin] Swelling   Current Outpatient Prescriptions on File Prior to Visit  Medication Sig Dispense Refill  . doxycycline (VIBRAMYCIN) 100 MG capsule Take 100 mg by mouth 2 (two) times daily.  1  . hydrALAZINE (APRESOLINE) 25 MG tablet Take one tablet by mouth three times daily for blood pressure (Patient taking differently: Take 25 mg by mouth 3 (three) times daily. Take one tablet by mouth three times daily for blood pressure) 90 tablet 5  . losartan (COZAAR) 50 MG tablet TAKE 1 TABLET BY MOUTH ONCE DAILY FOR BLOOD PRESSURE 30 tablet 5   No current facility-administered medications on file prior to visit.   Past Medical History  Diagnosis Date  . Acute appendicitis with peritoneal abscess 01/28/2013  . Ileus, postoperative 01/28/2013  . Tobacco use disorder 01/28/2013  . Obesity, morbid 01/28/2013  . Shortness of breath   . Essential hypertension 04/16/2014  . Elevated PSA   . Bacteremia September 2015   Past Surgical History  Procedure Laterality Date  . Back surgery  1997, 2003    Dr.Apleton   . Laparoscopic appendectomy N/A 01/25/2013    Procedure: APPENDECTOMY LAPAROSCOPIC;  Surgeon: Ralene Ok, MD;  Location: [facility];  Service: General;  Laterality: N/A;  . Tee without cardioversion N/A 04/07/2014    Procedure: TRANSESOPHAGEAL ECHOCARDIOGRAM (TEE);  Surgeon: Candee Furbish, MD;  Location: [facility];  Service: Cardiovascular;  Laterality: N/A;  . Shoulder surgery Right 2014    Dr.Whitfield   . Knee surgery Bilateral    Family History  Problem Relation Age of Onset  . Irritable bowel syndrome Mother   . Cancer Neg Hx   . Lung disease Neg Hx    Social History   Social History  . Marital Status: Divorced    Spouse Name: N/A  . Number of Children: 0  . Years of Education: N/A   Occupational History  . Retired    Social History Main Topics  . Smoking status: Current Every Day Smoker -- 1.00 packs/day for 50 years    Types: Cigars    Start date: 07/16/1958  . Smokeless tobacco: Never Used     Comment: Peak use 2ppd of cigarettes  . Alcohol Use: No  . Drug Use: No  . Sexual Activity: Not Currently   Other Topics Concern  . None   Social History Narrative   No diet   Yes, eats/drinks things with caffeine    Divorced, married 1979   Lives in a house, one stories , one person, no pets   Current/past profession- [employer]   Patient exercises, golf 3 days weekly  Originally from [facility]. Previously lived in [place] years and moved back to [place] in 1968. No international travel. He has traveled through multiple states traveling to [place]. Previously has worked in [employer] and also a [employer] running a [employer]. Unsure if he was exposed to any asbestos. No mold exposure. No pets currently. Previously has owned dogs. Parakeet as a child. No hot tub exposure. Enjoys golfing.      Objective:   Physical Exam Blood pressure 138/74, pulse 93, height '5\' 7"'$  (1.702 m), weight 208 lb 6.4 oz (94.53 kg), SpO2 96 %. General:  Awake. Alert. No acute distress. Obese, Caucasian  male.  Integument:  Warm & dry. No rash on exposed skin. No bruising. Significant sun exposure and pigmentation. Lymphatics:  No appreciated cervical or supraclavicular lymphadenoapthy. HEENT:  Moist mucus membranes. No oral ulcers. No scleral injection or icterus. PERRL. Cardiovascular:  Regular rate. No edema. No appreciable JVD.  Pulmonary:  Good aeration & clear to auscultation bilaterally. Symmetric chest wall expansion. No accessory muscle use. Abdomen: Soft. Normal bowel sounds. Protuberant. Grossly nontender. Musculoskeletal:  Normal bulk and tone. Hand grip strength 5/5 bilaterally. No joint deformity or effusion appreciated. Neurological:  CN 2-12 grossly in tact. No meningismus. Moving all 4 extremities equally. Symmetric patellar deep tendon reflexes. Psychiatric:  Mood and affect congruent. Speech normal rhythm, rate & tone.   IMAGING CT ABDOMEN/PELVIS W/ 02/25/15 (personally reviewed by me): No pleural effusion or thickening. No pericardial effusion. Approximately 1.8 cm round nodule in the left lower lobe. There is some minimal heterogeneity to it. This was not seen on the patient's previous CT scan of the chest from January 2016. 2.2 cm hypoattenuating lesion in the left hepatic lobe consistent with cyst. 7 mm cyst in the upper pole of the right kidney. No appreciated pelvic or retroperitoneal lymphadenopathy. Left colonic diverticula noted without acute inflammation. Some subcutaneous inflammatory changes in the soft tissues of the right groin.  CT CHEST W/O 08/05/14 (personally reviewed by me): Centrilobular & paraseptal emphysematous changes. Thin-walled cysts noted within bilateral lower lobes. No pleural effusion or thickening. No pericardial effusion. Radiology commented on subcentimeter pulmonary nodules. The largest that I can appreciate is approximately 5 mm in greatest dimension within the posterior segment of the right upper lobe.  CARDIAC TEE (04/07/14): LV wall motion  normal. EF 55-60%rapid ventricular response NORMAL IN SIZE AND FUNCTION.ortic valve normal without regurgitation or atheroma. Trivial mitral regurgitation with structurally normal valve. LA normal in size without thrombus. RA normal in size without thrombus. RV normal in size and function. Pulmonary artery normal in size. No pericardial effusion. Trivial tricuspid regurgitation. Structurally normal pulmonic and tricuspid valves.  LABS 02/25/15  BMP: 136/4.0/103/23/14/0.87/113/9.1 CBC: 10.5/18.0/51.9/204  01/28/15 PSA: 5.6    Assessment & Plan:  74 year old male with a long history of tobacco use presenting with a new left lower lobe rounded pulmonary nodule measuring over 1 cm. The [facility] lung nodule was calculator estimated approximately 27% risk for this nodule representing malignancy. Certainly this is of high probability given his history of tobacco use. We discussed CT-guided lung biopsy versus surgical lobectomy depending upon the results of his PET/CT scan and also his pulmonary function testing. I am concerned that his intermittent cough could be indicative of underlying COPD. We will attempt to obtain on a function testing and his PET/CT as soon as possible. I instructed the patient to contact my office if he had any further questions or concerns before his next  appointment.  1. Multiple lung nodules: Particularly concerning is his new left lower lobe nodule. Checking PET CT scan ASAP. Further biopsy versus lobectomy depending upon this result and pulmonary function testing. 2. Ongoing tobacco use: Checking for pulmonary function testing. Plan to discuss tobacco cessation at next appointment. 3. Follow-up: Patient to return to clinic in 2-3 weeks.

## 2015-03-02 NOTE — Patient Instructions (Signed)
1. We are checking a PET/CT scan to determine if and where we need to do a biopsy to determine if you have cancer. 2. I am checking breathing tests on you to determine whether or not your lungs could potentially tolerate removal of a lobe if you need to have surgery to remove cancer. 3. You will return to clinic in 2-3 weeks but please feel free to call if you have any further questions or concerns. I will contact you with the results of your scan once they're available to me.

## 2015-03-02 NOTE — Telephone Encounter (Signed)
IMAGING CT ABDOMEN/PELVIS W/ 02/25/15 (personally reviewed by me): No pleural effusion or thickening. No pericardial effusion. Approximately 1.8 cm round nodule in the left lower lobe. There is some minimal heterogeneity to it. This was not seen on the patient's previous CT scan of the chest from January 2016. 2.2 cm hypoattenuating lesion in the left hepatic lobe consistent with cyst. 7 mm cyst in the upper pole of the right kidney. No appreciated pelvic or retroperitoneal lymphadenopathy. Left colonic diverticula noted without acute inflammation. Some subcutaneous inflammatory changes in the soft tissues of the right groin.  CT CHEST W/O 08/05/14 (personally reviewed by me): Centrilobular & paraseptal emphysematous changes. Thin-walled cysts noted within bilateral lower lobes. No pleural effusion or thickening. No pericardial effusion. Radiology commented on subcentimeter pulmonary nodules. The largest that I can appreciate is approximately 5 mm in greatest dimension within the posterior segment of the right upper lobe.  CARDIAC TEE (04/07/14): LV wall motion normal. EF 55-60%rapid ventricular response NORMAL IN SIZE AND FUNCTION.ortic valve normal without regurgitation or atheroma. Trivial mitral regurgitation with structurally normal valve. LA normal in size without thrombus. RA normal in size without thrombus. RV normal in size and function. Pulmonary artery normal in size. No pericardial effusion. Trivial tricuspid regurgitation. Structurally normal pulmonic and tricuspid valves.  LABS 02/25/15  BMP: 136/4.0/103/23/14/0.87/113/9.1 CBC: 10.5/18.0/51.9/204  01/28/15 PSA: 5.6

## 2015-03-07 ENCOUNTER — Encounter (HOSPITAL_COMMUNITY)
Admission: RE | Admit: 2015-03-07 | Discharge: 2015-03-07 | Disposition: A | Discharge status: Home / Self Care | Payer: Medicare HMO | Source: Ambulatory Visit | Attending: Pulmonary Disease | Admitting: Pulmonary Disease

## 2015-03-07 DIAGNOSIS — R918 Other nonspecific abnormal finding of lung field: Secondary | ICD-10-CM | POA: Insufficient documentation

## 2015-03-07 LAB — GLUCOSE, CAPILLARY: GLUCOSE-CAPILLARY: 104 mg/dL — AB (ref 65–99)

## 2015-03-07 MED ORDER — FLUDEOXYGLUCOSE F - 18 (FDG) INJECTION
10.3100 | Freq: Once | INTRAVENOUS | Status: DC | PRN
  Administered 2015-03-07: 10.31 via INTRAVENOUS
  Filled 2015-03-07: qty 10.31

## 2015-03-08 ENCOUNTER — Other Ambulatory Visit: Payer: Self-pay | Admitting: Pulmonary Disease

## 2015-03-08 ENCOUNTER — Ambulatory Visit (INDEPENDENT_AMBULATORY_CARE_PROVIDER_SITE_OTHER): Payer: Medicare HMO | Admitting: Nurse Practitioner

## 2015-03-08 ENCOUNTER — Ambulatory Visit (HOSPITAL_COMMUNITY)
Admission: RE | Admit: 2015-03-08 | Discharge: 2015-03-08 | Disposition: A | Discharge status: Home / Self Care | Payer: Medicare HMO | Source: Ambulatory Visit | Attending: Pulmonary Disease | Admitting: Pulmonary Disease

## 2015-03-08 ENCOUNTER — Telehealth: Payer: Self-pay | Admitting: Pulmonary Disease

## 2015-03-08 ENCOUNTER — Encounter: Payer: Self-pay | Admitting: Nurse Practitioner

## 2015-03-08 VITALS — BP 140/56 | HR 76 | Temp 97.8°F | Resp 20 | Ht 67.0 in | Wt 209.4 lb

## 2015-03-08 DIAGNOSIS — R05 Cough: Secondary | ICD-10-CM | POA: Diagnosis not present

## 2015-03-08 DIAGNOSIS — R911 Solitary pulmonary nodule: Secondary | ICD-10-CM

## 2015-03-08 DIAGNOSIS — R0609 Other forms of dyspnea: Secondary | ICD-10-CM | POA: Insufficient documentation

## 2015-03-08 DIAGNOSIS — F17210 Nicotine dependence, cigarettes, uncomplicated: Secondary | ICD-10-CM | POA: Diagnosis not present

## 2015-03-08 DIAGNOSIS — L02214 Cutaneous abscess of groin: Secondary | ICD-10-CM | POA: Diagnosis not present

## 2015-03-08 DIAGNOSIS — Z720 Tobacco use: Secondary | ICD-10-CM | POA: Diagnosis not present

## 2015-03-08 DIAGNOSIS — B379 Candidiasis, unspecified: Secondary | ICD-10-CM

## 2015-03-08 DIAGNOSIS — F17200 Nicotine dependence, unspecified, uncomplicated: Secondary | ICD-10-CM

## 2015-03-08 LAB — PULMONARY FUNCTION TEST
DL/VA % pred: 86 %
DL/VA: 3.73 ml/min/mmHg/L
DLCO COR % PRED: 83 %
DLCO cor: 22.49 ml/min/mmHg
DLCO unc % pred: 90 %
DLCO unc: 24.38 ml/min/mmHg
FEF 25-75 POST: 1.28 L/s
FEF 25-75 Pre: 1.35 L/sec
FEF2575-%Change-Post: -5 %
FEF2575-%PRED-POST: 63 %
FEF2575-%Pred-Pre: 67 %
FEV1-%Change-Post: -3 %
FEV1-%Pred-Post: 83 %
FEV1-%Pred-Pre: 86 %
FEV1-Post: 2.25 L
FEV1-Pre: 2.34 L
FEV1FVC-%CHANGE-POST: 1 %
FEV1FVC-%Pred-Pre: 97 %
FEV6-%CHANGE-POST: -4 %
FEV6-%PRED-POST: 86 %
FEV6-%PRED-PRE: 91 %
FEV6-Post: 3.01 L
FEV6-Pre: 3.17 L
FEV6FVC-%Change-Post: 0 %
FEV6FVC-%PRED-POST: 103 %
FEV6FVC-%PRED-PRE: 102 %
FVC-%CHANGE-POST: -5 %
FVC-%PRED-POST: 83 %
FVC-%PRED-PRE: 88 %
FVC-POST: 3.1 L
FVC-Pre: 3.27 L
PRE FEV1/FVC RATIO: 71 %
PRE FEV6/FVC RATIO: 97 %
Post FEV1/FVC ratio: 73 %
Post FEV6/FVC ratio: 97 %
RV % PRED: 117 %
RV: 2.66 L
TLC % pred: 100 %
TLC: 6.27 L

## 2015-03-08 MED ORDER — NYSTATIN 100000 UNIT/GM EX CREA: 1.0000 "application " | TOPICAL_CREAM | Freq: Two times a day (BID) | CUTANEOUS | Status: DC

## 2015-03-08 MED ORDER — FLUCONAZOLE 100 MG PO TABS: 100.0000 mg | ORAL_TABLET | Freq: Every day | ORAL | Status: DC

## 2015-03-08 MED ORDER — ALBUTEROL SULFATE (2.5 MG/3ML) 0.083% IN NEBU
2.5000 mg | INHALATION_SOLUTION | Freq: Once | RESPIRATORY_TRACT | Status: AC
  Administered 2015-03-08: 2.5 mg via RESPIRATORY_TRACT

## 2015-03-08 NOTE — Telephone Encounter (Signed)
I spoke with the patient regarding the results of his PET/CT scan. Left lower lobe lung nodule is hypermetabolic and concerning for malignancy. Patient does have pulmonary function testing at 1 PM today which he will continue with. Plan for a CT-guided lung biopsy of the hypermetabolic nodule to be done as soon as possible. Depending upon his lung function we may need a VQ scan to quantitate blood flow.

## 2015-03-08 NOTE — Progress Notes (Signed)
Patient ID: [name], male   DOB: Feb 04, 1941, 74 y.o.   MRN: [id number]    PCP: [name], NP  Allergies  Allergen Reactions  . Asa [Aspirin] Swelling    Chief Complaint  Patient presents with  . Medical Management of Chronic Issues    1 week follow-up for abscess & pulmonary nodule     HPI: Patient is a 74 y.o. male seen in the office today to follow up right groin abscess that was I&D by ED on 02/25/15. Has completed antibiotic. Drainage is minimal. Denies pain. No fevers or chills. Large amount of yeast to right groin.  Has already followed up with pulmonary due to nodule.  Went for PET scan yesterday and has another test today.   Completely quit smoking 4 days ago. Has done well except for today increase urge.    Advanced Directive information Does patient have an advance directive?: No, Would patient like information on creating an advanced directive?: Yes - Educational materials given Review of Systems:  Review of Systems  Constitutional: Negative for fever, chills, activity change and appetite change.  Respiratory: Negative for cough and shortness of breath.   Cardiovascular: Negative for chest pain.  Skin: Positive for wound.       Right groin abscess s/p drainage    Past Medical History  Diagnosis Date  . Acute appendicitis with peritoneal abscess 01/28/2013  . Ileus, postoperative 01/28/2013  . Tobacco use disorder 01/28/2013  . Obesity, morbid 01/28/2013  . Shortness of breath   . Essential hypertension 04/16/2014  . Elevated PSA   . Bacteremia September 2015   Past Surgical History  Procedure Laterality Date  . Back surgery  1997, 2003    Dr.Apleton   . Laparoscopic appendectomy N/A 01/25/2013    Procedure: APPENDECTOMY LAPAROSCOPIC;  Surgeon: Ralene Ok, MD;  Location: [facility];  Service: General;  Laterality: N/A;  . Tee without cardioversion N/A 04/07/2014    Procedure: TRANSESOPHAGEAL ECHOCARDIOGRAM (TEE);  Surgeon: Candee Furbish, MD;   Location: [facility];  Service: Cardiovascular;  Laterality: N/A;  . Shoulder surgery Right 2014    Dr.Whitfield   . Knee surgery Bilateral    Social History:   reports that he has been smoking Cigars.  He started smoking about 56 years ago. He has never used smokeless tobacco. He reports that he does not drink alcohol or use illicit drugs.  Family History  Problem Relation Age of Onset  . Irritable bowel syndrome Mother   . Cancer Neg Hx   . Lung disease Neg Hx     Medications: Patient's Medications  New Prescriptions   No medications on file  Previous Medications   HYDRALAZINE (APRESOLINE) 25 MG TABLET    Take one tablet by mouth three times daily for blood pressure   LOSARTAN (COZAAR) 50 MG TABLET    TAKE 1 TABLET BY MOUTH ONCE DAILY FOR BLOOD PRESSURE  Modified Medications   No medications on file  Discontinued Medications   DOXYCYCLINE (VIBRAMYCIN) 100 MG CAPSULE    Take 100 mg by mouth 2 (two) times daily.     Physical Exam:  Filed Vitals:   03/08/15 0816  BP: 140/56  Pulse: 76  Temp: 97.8 F (36.6 C)  TempSrc: Oral  Resp: 20  Height: '5\' 7"'$  (1.702 m)  Weight: 209 lb 6.4 oz (94.983 kg)  SpO2: 97%    Physical Exam  Constitutional: He appears well-developed and well-nourished.  Obese white male  Cardiovascular: Normal rate, regular rhythm  and normal heart sounds.   Pulmonary/Chest: Effort normal.  Coarse rhonchi  Abdominal: Soft. Bowel sounds are normal.  Abdominal obesity  Skin:  Right groin with pencil eraser sized open wound without drainage. Beefy red yeast surrounding area.    Labs reviewed: Basic Metabolic Panel:  Recent Labs  04/01/14 2018  04/03/14 0045 04/04/14 0309  04/07/14 0258  04/11/14 0450  07/27/14 0174 01/28/15 0828 02/25/15 0950  NA  --   < > 132* 138  < > 140  < > 142  < > 143 140 136  K  --   < > 3.7 3.2*  < > 4.0  < > 3.7  < > 4.5 4.5 4.0  CL  --   < > 99 101  < > 104  < > 101  < > 100 101 103  CO2  --   < > 20 24  < >  28  < > 30  < > '24 21 23  '$ GLUCOSE  --   < > 137* 121*  < > 111*  < > 105*  < > 93 88 113*  BUN  --   < > 23 20  < > 19  < > 14  < > '12 19 14  '$ CREATININE 0.80  < > 0.76 0.65  < > 0.65  < > 0.58  < > 0.90 0.84 0.87  CALCIUM  --   < > 7.7* 7.8*  < > 8.3*  < > 8.5  < > 9.7 9.2 9.1  MG  --   < > 2.2 2.1  --  2.2  --  2.0  --   --   --   --   PHOS  --   --  1.6* 2.0*  --   --   --   --   --   --   --   --   TSH 0.976  --   --   --   --   --   --   --   --   --   --   --   < > = values in this interval not displayed. Liver Function Tests:  Recent Labs  04/06/14 0302 04/07/14 0258 04/11/14 0450 05/05/14 07/27/14 0837 01/28/15 0828  AST 41* 40* '27 23 25 21  '$ ALT 42 39 '26 28 28 18  '$ ALKPHOS 96 117 101 107 88 80  BILITOT 0.8 0.9 0.6  --  0.3 0.5  PROT 5.6* 5.7* 6.2  --  7.4 6.5  ALBUMIN 2.2* 2.3* 2.4*  --   --   --    No results for input(s): LIPASE, AMYLASE in the last 8760 hours. No results for input(s): AMMONIA in the last 8760 hours. CBC:  Recent Labs  04/11/14 0450 04/14/14 0540  05/05/14 07/27/14 0837 01/28/15 0828 02/25/15 0950  WBC 12.2* 11.0*  < > 8.7 6.8 7.4 10.5  NEUTROABS 10.3*  --   --   --  4.6 5.2  --   HGB 16.1 15.2  --  14.4 17.6  --  18.0*  HCT 47.6 45.3  --  43 53.2* 50.3 51.9  MCV 96.9 95.2  --   --  100*  --  96.8  PLT 384 432*  --  276 236  --  204  < > = values in this interval not displayed. Lipid Panel:  Recent Labs  07/27/14 0837 01/28/15 0828  CHOL 181 159  HDL 51 41  LDLCALC 99  86  TRIG 154* 160*  CHOLHDL 3.5 3.9   TSH:  Recent Labs  04/01/14 2018  TSH 0.976   A1C: Lab Results  Component Value Date   HGBA1C 5.3 07/27/2014     Assessment/Plan 1. Soft tissue abscess of inguinal region Abscess improved. Cont dressing daily.   2. Tobacco abuse -has quit smoking at this time. Cont cessation.   3. Incidental lung nodule, greater than or equal to 9m -following with pulmonary, has already been to pulmonary office and PET scan done,  has ongoing testing which is done today.   4. Candidiasis -in right groin, large amount of yeast.  - fluconazole (DIFLUCAN) 100 MG tablet; Take 1 tablet (100 mg total) by mouth daily.  Dispense: 7 tablet; Refill: 0 - nystatin cream (MYCOSTATIN); Apply 1 application topically 2 (two) times daily.  Dispense: 30 g; Refill: 0 -not to place cream to open area  Follow up in 2 weeks   Troye Hiemstra K. EHarle Battiest PH[facility] [phone] am - 5 pm) [phone](after hours)

## 2015-03-08 NOTE — Patient Instructions (Signed)
To take diflucan 100 mg daily for 7 days To use nystatin ointment twice daily-- do not place in open area  Cont dressing changes daily Keep area clean and dry  Follow up in 2 weeks for groin wound

## 2015-03-09 ENCOUNTER — Other Ambulatory Visit: Payer: Self-pay | Admitting: Radiology

## 2015-03-09 ENCOUNTER — Telehealth: Payer: Self-pay | Admitting: Pulmonary Disease

## 2015-03-09 NOTE — Telephone Encounter (Signed)
Spoke with the patient regarding the results of his pulmonary function testing. His spirometry is normal as well as his lung volumes and carbon dioxide diffusion capacity. This could potentially make him an excellent candidate for lobectomy in the event that his biopsy proved to be cancer. We did discuss lobectomy versus SBRT. We're proceeding with a percutaneous lung biopsy tomorrow to determine the histology of this presumed cancer. I will contact the patient with this result and we will proceed accordingly with quantitative VQ scan to evaluate his post lobectomy lung function.

## 2015-03-10 ENCOUNTER — Ambulatory Visit (HOSPITAL_COMMUNITY)
Admission: RE | Admit: 2015-03-10 | Discharge: 2015-03-10 | Disposition: A | Discharge status: Home / Self Care | Payer: Medicare HMO | Source: Ambulatory Visit | Attending: Interventional Radiology | Admitting: Interventional Radiology

## 2015-03-10 ENCOUNTER — Encounter (HOSPITAL_COMMUNITY): Payer: Self-pay

## 2015-03-10 ENCOUNTER — Ambulatory Visit (HOSPITAL_COMMUNITY)
Admission: RE | Admit: 2015-03-10 | Discharge: 2015-03-10 | Disposition: A | Discharge status: Home / Self Care | Payer: Medicare HMO | Source: Ambulatory Visit | Attending: Pulmonary Disease | Admitting: Pulmonary Disease

## 2015-03-10 ENCOUNTER — Ambulatory Visit (HOSPITAL_COMMUNITY): Payer: Medicare HMO

## 2015-03-10 DIAGNOSIS — F17210 Nicotine dependence, cigarettes, uncomplicated: Secondary | ICD-10-CM | POA: Diagnosis not present

## 2015-03-10 DIAGNOSIS — I10 Essential (primary) hypertension: Secondary | ICD-10-CM | POA: Insufficient documentation

## 2015-03-10 DIAGNOSIS — Z9889 Other specified postprocedural states: Secondary | ICD-10-CM

## 2015-03-10 DIAGNOSIS — R911 Solitary pulmonary nodule: Secondary | ICD-10-CM

## 2015-03-10 DIAGNOSIS — R918 Other nonspecific abnormal finding of lung field: Secondary | ICD-10-CM | POA: Diagnosis present

## 2015-03-10 DIAGNOSIS — C3432 Malignant neoplasm of lower lobe, left bronchus or lung: Secondary | ICD-10-CM | POA: Diagnosis not present

## 2015-03-10 DIAGNOSIS — Z79899 Other long term (current) drug therapy: Secondary | ICD-10-CM | POA: Insufficient documentation

## 2015-03-10 DIAGNOSIS — C3430 Malignant neoplasm of lower lobe, unspecified bronchus or lung: Secondary | ICD-10-CM | POA: Insufficient documentation

## 2015-03-10 LAB — CBC
HCT: 51.7 % (ref 39.0–52.0)
Hemoglobin: 17.7 g/dL — ABNORMAL HIGH (ref 13.0–17.0)
MCH: 33.1 pg (ref 26.0–34.0)
MCHC: 34.2 g/dL (ref 30.0–36.0)
MCV: 96.8 fL (ref 78.0–100.0)
PLATELETS: 199 10*3/uL (ref 150–400)
RBC: 5.34 MIL/uL (ref 4.22–5.81)
RDW: 14.4 % (ref 11.5–15.5)
WBC: 6.6 10*3/uL (ref 4.0–10.5)

## 2015-03-10 LAB — PROTIME-INR
INR: 1.06 (ref 0.00–1.49)
PROTHROMBIN TIME: 14 s (ref 11.6–15.2)

## 2015-03-10 LAB — APTT: aPTT: 32 seconds (ref 24–37)

## 2015-03-10 MED ORDER — SODIUM CHLORIDE 0.9 % IV SOLN: INTRAVENOUS | Status: DC

## 2015-03-10 MED ORDER — FENTANYL CITRATE (PF) 100 MCG/2ML IJ SOLN
INTRAMUSCULAR | Status: AC
  Filled 2015-03-10: qty 4

## 2015-03-10 MED ORDER — MIDAZOLAM HCL 2 MG/2ML IJ SOLN
INTRAMUSCULAR | Status: AC | PRN
  Administered 2015-03-10: 1 mg via INTRAVENOUS

## 2015-03-10 MED ORDER — FENTANYL CITRATE (PF) 100 MCG/2ML IJ SOLN
INTRAMUSCULAR | Status: AC | PRN
  Administered 2015-03-10 (×2): 50 ug via INTRAVENOUS

## 2015-03-10 MED ORDER — LIDOCAINE HCL 1 % IJ SOLN
INTRAMUSCULAR | Status: AC
  Filled 2015-03-10: qty 20

## 2015-03-10 MED ORDER — MIDAZOLAM HCL 2 MG/2ML IJ SOLN
INTRAMUSCULAR | Status: AC
  Filled 2015-03-10: qty 4

## 2015-03-10 NOTE — H&P (Signed)
Chief Complaint: Patient was seen in consultation today for left lung mass at the request of Nestor,Jennings E  Referring Physician(s): Nestor,Jennings E  History of Present Illness: [name] is a 74 y.o. male   Pt was seen by MD regarding Rt groin abscess CT incidentally reveals LLL mass Work up includes +PET 8/22: IMPRESSION: 1. The dominant left lower lobe nodule is hypermetabolic with malignant range metabolic activity worrisome for malignancy. Of note, there was no abnormality in this area on the screening chest CT performed only 7 months ago. Therefore, infection is a possible explanation for this finding. Correlate clinically. 2. The other tiny nodules seen on prior screening chest CT are unchanged. 3. No other worrisome activity. No evidence of metastatic disease. 4. Presumed inflammatory/postsurgical changes in the right groin  Request for LLL mass biopsy Now scheduled for same  Past Medical History  Diagnosis Date  . Acute appendicitis with peritoneal abscess 01/28/2013  . Ileus, postoperative 01/28/2013  . Tobacco use disorder 01/28/2013  . Obesity, morbid 01/28/2013  . Shortness of breath   . Essential hypertension 04/16/2014  . Elevated PSA   . Bacteremia September 2015    Past Surgical History  Procedure Laterality Date  . Back surgery  1997, 2003    Dr.Apleton   . Laparoscopic appendectomy N/A 01/25/2013    Procedure: APPENDECTOMY LAPAROSCOPIC;  Surgeon: Ralene Ok, MD;  Location: [facility];  Service: General;  Laterality: N/A;  . Tee without cardioversion N/A 04/07/2014    Procedure: TRANSESOPHAGEAL ECHOCARDIOGRAM (TEE);  Surgeon: Candee Furbish, MD;  Location: [facility];  Service: Cardiovascular;  Laterality: N/A;  . Shoulder surgery Right 2014    Dr.Whitfield   . Knee surgery Bilateral     Allergies: Asa  Medications: Prior to Admission medications   Medication Sig Start Date End Date Taking? Authorizing Provider  hydrALAZINE  (APRESOLINE) 25 MG tablet Take one tablet by mouth three times daily for blood pressure Patient taking differently: Take 25 mg by mouth 3 (three) times daily. Take one tablet by mouth three times daily for blood pressure 12/27/14  Yes [name], NP  losartan (COZAAR) 50 MG tablet TAKE 1 TABLET BY MOUTH ONCE DAILY FOR BLOOD PRESSURE 10/25/14  Yes [name], NP  nystatin cream (MYCOSTATIN) Apply 1 application topically 2 (two) times daily. 03/08/15  Yes [name], NP  fluconazole (DIFLUCAN) 100 MG tablet Take 1 tablet (100 mg total) by mouth daily. 03/08/15   [name], NP     Family History  Problem Relation Age of Onset  . Irritable bowel syndrome Mother   . Cancer Neg Hx   . Lung disease Neg Hx     Social History   Social History  . Marital Status: Divorced    Spouse Name: N/A  . Number of Children: 0  . Years of Education: N/A   Occupational History  . Retired    Social History Main Topics  . Smoking status: Current Every Day Smoker -- 1.00 packs/day for 50 years    Types: Cigars    Start date: 07/16/1958  . Smokeless tobacco: Never Used     Comment: Peak use 2ppd of cigarettes  . Alcohol Use: No  . Drug Use: No  . Sexual Activity: Not Currently   Other Topics Concern  . None   Social History Narrative   No diet   Yes, eats/drinks things with caffeine    Divorced, married 1979   Lives in a house,  one stories , one person, no pets   Current/past profession- [employer]   Patient exercises, golf 3 days weekly        Originally from [place]. Previously lived in [facility] for [place] years and moved back to [place] in 1968. No international travel. He has traveled through multiple states traveling to [place]. 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 Unsure if he was exposed to any asbestos. No mold exposure. No pets currently. Previously has owned dogs. Parakeet as a child. No hot tub exposure.  Enjoys golfing.     Review of Systems: A 12 point ROS discussed and pertinent positives are indicated in the HPI above.  All other systems are negative.  Review of Systems  Constitutional: Negative for fatigue and unexpected weight change.  Respiratory: Positive for cough.   Neurological: Negative for weakness.  Psychiatric/Behavioral: Negative for behavioral problems and confusion.    Vital Signs: BP 149/61 mmHg  Pulse 72  Temp(Src) 98.2 F (36.8 C) (Oral)  Resp 18  Ht '5\' 7"'$  (1.702 m)  Wt 208 lb (94.348 kg)  BMI 32.57 kg/m2  SpO2 95%  Physical Exam  Constitutional: He is oriented to person, place, and time.  Cardiovascular: Normal rate, regular rhythm and normal heart sounds.   Pulmonary/Chest: Effort normal and breath sounds normal. He has no wheezes.  Abdominal: Soft. Bowel sounds are normal. There is no tenderness.  Musculoskeletal: Normal range of motion.  Neurological: He is alert and oriented to person, place, and time.  Skin: Skin is warm and dry.  Psychiatric: He has a normal mood and affect. His behavior is normal. Judgment and thought content normal.  Nursing note and vitals reviewed.   Mallampati Score:  MD Evaluation Airway: WNL Heart: WNL Abdomen: WNL Chest/ Lungs: WNL ASA  Classification: 3 Mallampati/Airway Score: One  Imaging: Ct Abdomen Pelvis W Contrast  02/25/2015   CLINICAL DATA:  Pt c/o raised, red, warm area to right groin x1 week. H/o appendectomy x 1 year ago. No NVD, no fever. No h/o bowel diseases. 189m omni 300 used. Lead BB placed on right groin to mark area of interest. Additional images taken to include area of interest after initial scan.  EXAM: CT ABDOMEN AND PELVIS WITH CONTRAST  TECHNIQUE: Multidetector CT imaging of the abdomen and pelvis was performed using the standard protocol following bolus administration of intravenous contrast.  CONTRAST:  1035mOMNIPAQUE IOHEXOL 300 MG/ML  SOLN  COMPARISON:  CT, 04/01/2014  FINDINGS: Lung  bases: Oval, 18 mm, fairly homogeneous and smoothly circumscribed left lower lobe nodule, not present on the prior CT. No lung consolidation or edema. No pleural effusion. Heart normal in size.  Liver: 2.2 cm hypo attenuating lesion in the left lobe, stable, consistent with a cyst. No other liver abnormality.  Spleen, gallbladder, pancreas, adrenal glands:  Unremarkable.  Kidneys, ureters, bladder: 7 mm cyst from the upper pole of the right kidney. Prominent right renal vascular calcifications, stable. No other renal masses. No hydronephrosis. Normal ureters. Bladder is unremarkable.  Lymph nodes:  No enlarged lymph nodes.  Ascites: None.  Vascular: Atherosclerotic calcifications along the aorta and its branch vessels. Mild ectasia. Aorta measures 2.5 cm in greatest transverse dimension.  Gastrointestinal: Stomach, small bowel are unremarkable. Few small left colon diverticula. No diverticulitis. No other colonic abnormality.  Right inguinal region: There is a small fluid collection with enhancing margins and adjacent hazy inflammatory change in the superficial subcutaneous soft tissues  of the right groin measuring 17 x 14 x 16 mm.  Musculoskeletal: Advanced degenerative changes noted of the lumbar spine with a levoscoliosis. No osteoblastic or osteolytic lesions. Subchondral cystic change noted along the superior left acetabulum.  IMPRESSION: 1. Small fluid collection with enhancing wall and adjacent inflammation in the superficial subcutaneous soft tissues of the right groin. This measures 17 mm in greatest dimension consistent with small subcutaneous abscess. 2. No other acute findings. 3. 18 mm smoothly circumscribed left lower lobe nodule which was not present on the prior CT. The etiology is unclear. Given that this is new, neoplastic disease is in the differential diagnosis and biopsy should be considered. 4. 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 Ectatic abdominal aorta  at risk for aneurysm development. Recommend followup by ultrasound in 5 years. This recommendation follows ACR consensus guidelines: White Paper of the ACR Incidental Findings Committee II on Vascular Findings. J Am Coll Radiol 2013; 10:789-794.   Electronically Signed   By: Lajean Manes M.D.   On: 02/25/2015 11:15   Nm Pet Image Initial (pi) Skull Base To Thigh  03/07/2015   CLINICAL DATA:  Initial treatment strategy for pulmonary nodules.  EXAM: NUCLEAR MEDICINE PET SKULL BASE TO THIGH  TECHNIQUE: 7.09 mCi F-18 FDG was injected intravenously. Full-ring PET imaging was performed from the skull base to thigh after the radiotracer. CT data was obtained and used for attenuation correction and anatomic localization.  FASTING BLOOD GLUCOSE:  Value: 104 mg/dl  COMPARISON:  Screening chest CT 08/05/2014. Abdominal pelvic CTs 04/01/2014 and 02/24/2005.  FINDINGS: NECK  No hypermetabolic cervical lymph nodes are identified.There are no lesions of the pharyngeal mucosal space.  CHEST  There are no hypermetabolic mediastinal, hilar or axillary lymph nodes. The dominant left lower lobe nodule demonstrated on recent abdominal CT is hypermetabolic. This nodule measures 2.0 x 1.8 cm on image 46 and has an SUV max of 13.2. No other hypermetabolic pulmonary activity demonstrated. Additional scattered tiny nodules are unchanged from prior chest CT, including a 4 mm right upper lobe nodule on image number 20 and tiny nodules in the right middle lobe on image 38 and in the lingula on image 49.  ABDOMEN/PELVIS  There is no hypermetabolic activity within the liver, adrenal glands, spleen or pancreas. There is no hypermetabolic nodal activity. There is hypermetabolic activity in the right groin corresponding with presumed inflammatory changes on recent CT. This has an SUV max of 9.6. Hepatic steatosis, a probable cyst in the left hepatic lobe, diffuse atherosclerosis and a left scrotal hydrocele noted.  SKELETON  There is no  hypermetabolic activity to suggest osseous metastatic disease. Several sebaceous cysts are noted within the back, larger on the right. There is no associated abnormal metabolic activity.  IMPRESSION: 1. The dominant left lower lobe nodule is hypermetabolic with malignant range metabolic activity worrisome for malignancy. Of note, there was no abnormality in this area on the screening chest CT performed only 7 months ago. Therefore, infection is a possible explanation for this finding. Correlate clinically. 2. The other tiny nodules seen on prior screening chest CT are unchanged. 3. No other worrisome activity.  No evidence of metastatic disease. 4. Presumed inflammatory/postsurgical changes in the right groin.   Electronically Signed   By: Richardean Sale M.D.   On: 03/07/2015 08:56    Labs:  CBC:  Recent Labs  05/05/14 07/27/14 0837 01/28/15 0828 02/25/15 0950 03/10/15 0800  WBC 8.7 6.8 7.4 10.5 6.6  HGB 14.4 17.6  --  18.0* 17.7*  HCT 43 53.2* 50.3 51.9 51.7  PLT 276 236  --  204 199    COAGS:  Recent Labs  04/02/14 0253 04/06/14 2015 04/07/14 1240 03/10/15 0800  INR 1.39  --  1.26 1.06  APTT  --  29 36 32    BMP:  Recent Labs  04/15/14 0450 05/05/14 07/27/14 0837 01/28/15 0828 02/25/15 0950  NA 141 139 143 140 136  K 3.7 4.4 4.5 4.5 4.0  CL 100  --  100 101 103  CO2 31  --  '24 21 23  '$ GLUCOSE 115*  --  93 88 113*  BUN '16 17 12 19 14  '$ CALCIUM 8.6  --  9.7 9.2 9.1  CREATININE 0.69 0.8 0.90 0.84 0.87  GFRNONAA >90  --  86 88 >60  GFRAA >90  --  99 102 >60    LIVER FUNCTION TESTS:  Recent Labs  04/04/14 0309 04/06/14 0302 04/07/14 0258 04/11/14 0450 05/05/14 07/27/14 0837 01/28/15 0828  BILITOT 1.7* 0.8 0.9 0.6  --  0.3 0.5  AST 60* 41* 40* '27 23 25 21  '$ ALT 52 42 39 '26 28 28 18  '$ ALKPHOS 95 96 117 101 107 88 80  PROT 5.8* 5.6* 5.7* 6.2  --  7.4 6.5  ALBUMIN 2.2* 2.2* 2.3* 2.4*  --   --   --     TUMOR MARKERS: No results for input(s): AFPTM, CEA,  CA199, CHROMGRNA in the last 8760 hours.  Assessment and Plan:  LLL lung mass +PET Now scheduled for biopsy Risks and Benefits discussed with the patient including, but not limited to bleeding, hemoptysis, respiratory failure requiring intubation, infection, pneumothorax requiring chest tube placement, stroke from air embolism or even death. All of the patient's questions were answered, patient is agreeable to proceed. Consent signed and in chart.  Thank you for this interesting consult.  I greatly enjoyed meeting [name] and look forward to participating in their care.  A copy of this report was sent to the requesting provider on this date.  Signed: Dynastee Brummell A 03/10/2015, 8:31 AM   I spent a total of  30 Minutes   in face to face in clinical consultation, greater than 50% of which was counseling/coordinating care for LLL mas bx

## 2015-03-10 NOTE — Procedures (Signed)
Interventional Radiology Procedure Note  Procedure: CT guided left lung nodule biopsy.  3 x 18G core biopsy Complications: None Recommendations:  - Ok to shower tomorrow - Observe 2.5 hours. - CXR in 2 hours - NPO until CXR    Signed,  Dulcy Fanny. Earleen Newport, DO

## 2015-03-10 NOTE — Sedation Documentation (Signed)
Band-aid to Left flank area intact

## 2015-03-10 NOTE — Sedation Documentation (Signed)
Patient is resting comfortably. 

## 2015-03-10 NOTE — Discharge Instructions (Signed)
Needle Biopsy of Lung, Care After °Refer to this sheet in the next few weeks. These instructions provide you with information on caring for yourself after your procedure. Your health care provider may also give you more specific instructions. Your treatment has been planned according to current medical practices, but problems sometimes occur. Call your health care provider if you have any problems or questions after your procedure. °WHAT TO EXPECT AFTER THE PROCEDURE °· A bandage will be applied over the area where the needle was inserted. You may be asked to apply pressure to the bandage for several minutes to ensure there is minimal bleeding. °· In most cases, you can leave when your needle biopsy procedure is completed. Do not drive yourself home. Someone else should take you home. °· If you received an IV sedative or general anesthetic, you will be taken to a comfortable place to relax while the medicine wears off. °· If you have upcoming travel scheduled, talk to your health care provider about when it is safe to travel by air after the procedure. °HOME CARE INSTRUCTIONS °· Expect to take it easy for the rest of the day. °· Protect the area where you received the needle biopsy by keeping the bandage in place for as long as instructed. °· You may feel some mild pain or discomfort in the area, but this should stop in a day or two. °· Take medicines only as directed by your health care provider. °SEEK MEDICAL CARE IF:  °· You have pain at the biopsy site that worsens or is not helped by medicine. °· You have swelling or drainage at the needle biopsy site. °· You have a fever. °SEEK IMMEDIATE MEDICAL CARE IF:  °· You have new or worsening shortness of breath. °· You have chest pain. °· You are coughing up blood. °· You have bleeding that does not stop with pressure or a bandage. °· You develop light-headedness or fainting. °Document Released: 04/29/2007 Document Revised: 11/16/2013 Document Reviewed:  11/24/2012 °ExitCare® Patient Information ©2015 [employer]. This information is not intended to replace advice given to you by your health care provider. Make sure you discuss any questions you have with your health care provider. ° °

## 2015-03-16 ENCOUNTER — Telehealth: Payer: Self-pay | Admitting: Pulmonary Disease

## 2015-03-16 NOTE — Telephone Encounter (Signed)
Called spoke with pt. He is requesting his BX results. Please advise Dr. Ashok Cordia thanks

## 2015-03-17 NOTE — Telephone Encounter (Signed)
Please call pathology and find out where the biopsy result is. He had this done on 8/25.

## 2015-03-18 NOTE — Telephone Encounter (Signed)
Called pathology lab and they informed me that patient's biopsy results are still pending. Will send results when complete.   Patient notified that results are still pending. FYI to [place] and Dr. Ashok Cordia

## 2015-03-22 ENCOUNTER — Ambulatory Visit (INDEPENDENT_AMBULATORY_CARE_PROVIDER_SITE_OTHER): Payer: Medicare HMO | Admitting: Nurse Practitioner

## 2015-03-22 ENCOUNTER — Encounter: Payer: Self-pay | Admitting: Nurse Practitioner

## 2015-03-22 VITALS — BP 120/58 | HR 85 | Temp 98.5°F | Resp 20 | Ht 67.0 in | Wt 210.4 lb

## 2015-03-22 DIAGNOSIS — B379 Candidiasis, unspecified: Secondary | ICD-10-CM

## 2015-03-22 DIAGNOSIS — L02214 Cutaneous abscess of groin: Secondary | ICD-10-CM

## 2015-03-22 DIAGNOSIS — R972 Elevated prostate specific antigen [PSA]: Secondary | ICD-10-CM | POA: Diagnosis not present

## 2015-03-22 MED ORDER — NYSTATIN 100000 UNIT/GM EX POWD: CUTANEOUS | Status: DC

## 2015-03-22 NOTE — Progress Notes (Signed)
Patient ID: [name], male   DOB: 1940/09/30, 74 y.o.   MRN: [id number]    PCP: [name], NP  Allergies  Allergen Reactions  . Asa [Aspirin] Swelling    Chief Complaint  Patient presents with  . Medical Management of Chronic Issues    f/u for groin abscess     HPI: Patient is a 74 y.o. male seen in the office today to follow up right groin abscess that was I&D by ED on 02/25/15. Has completed antibiotic. Pt then treated with fluconazole and now using nystatin cream with good result. Area feels great.  Since last visit was notified that nodule "lit up" during PET scan and has had a biopsy done. No results yet.   Advanced Directive information Does patient have an advance directive?: No, Would patient like information on creating an advanced directive?: No - patient declined information Review of Systems:  Review of Systems  Constitutional: Negative for fever, chills, activity change and appetite change.  Respiratory: Negative for cough and shortness of breath.   Cardiovascular: Negative for chest pain.  Skin: Positive for wound (improved).    Past Medical History  Diagnosis Date  . Acute appendicitis with peritoneal abscess 01/28/2013  . Ileus, postoperative 01/28/2013  . Tobacco use disorder 01/28/2013  . Obesity, morbid 01/28/2013  . Shortness of breath   . Essential hypertension 04/16/2014  . Elevated PSA   . Bacteremia September 2015   Past Surgical History  Procedure Laterality Date  . Back surgery  1997, 2003    Dr.Apleton   . Laparoscopic appendectomy N/A 01/25/2013    Procedure: APPENDECTOMY LAPAROSCOPIC;  Surgeon: Ralene Ok, MD;  Location: [facility];  Service: General;  Laterality: N/A;  . Tee without cardioversion N/A 04/07/2014    Procedure: TRANSESOPHAGEAL ECHOCARDIOGRAM (TEE);  Surgeon: Candee Furbish, MD;  Location: [place];  Service: Cardiovascular;  Laterality: N/A;  . Shoulder surgery Right 2014    Dr.Whitfield   . Knee surgery Bilateral     Social History:   reports that he has been smoking Cigars.  He started smoking about 56 years ago. He has never used smokeless tobacco. He reports that he does not drink alcohol or use illicit drugs.  Family History  Problem Relation Age of Onset  . Irritable bowel syndrome Mother   . Cancer Neg Hx   . Lung disease Neg Hx     Medications: Patient's Medications  New Prescriptions   No medications on file  Previous Medications   FLUCONAZOLE (DIFLUCAN) 100 MG TABLET    Take 1 tablet (100 mg total) by mouth daily.   HYDRALAZINE (APRESOLINE) 25 MG TABLET    Take one tablet by mouth three times daily for blood pressure   LOSARTAN (COZAAR) 50 MG TABLET    TAKE 1 TABLET BY MOUTH ONCE DAILY FOR BLOOD PRESSURE   NYSTATIN CREAM (MYCOSTATIN)    Apply 1 application topically 2 (two) times daily.  Modified Medications   No medications on file  Discontinued Medications   No medications on file     Physical Exam:  Filed Vitals:   03/22/15 0939  BP: 120/58  Pulse: 85  Temp: 98.5 F (36.9 C)  TempSrc: Oral  Resp: 20  Height: '5\' 7"'$  (1.702 m)  Weight: 210 lb 6.4 oz (95.437 kg)  SpO2: 96%    Physical Exam  Constitutional: He is oriented to person, place, and time. He appears well-developed and well-nourished.  Obese white male  Cardiovascular: Normal rate, regular rhythm  and normal heart sounds.   Pulmonary/Chest: Effort normal and breath sounds normal.  Abdominal: Soft. Bowel sounds are normal.  Abdominal obesity  Neurological: He is alert and oriented to person, place, and time.  Skin: Skin is warm and dry.  Right groin wound healing well. Yeast has improved.     Labs reviewed: Basic Metabolic Panel:  Recent Labs  04/01/14 2018  04/03/14 0045 04/04/14 0309  04/07/14 0258  04/11/14 0450  07/27/14 9518 01/28/15 0828 02/25/15 0950  NA  --   < > 132* 138  < > 140  < > 142  < > 143 140 136  K  --   < > 3.7 3.2*  < > 4.0  < > 3.7  < > 4.5 4.5 4.0  CL  --   < > 99 101   < > 104  < > 101  < > 100 101 103  CO2  --   < > 20 24  < > 28  < > 30  < > '24 21 23  '$ GLUCOSE  --   < > 137* 121*  < > 111*  < > 105*  < > 93 88 113*  BUN  --   < > 23 20  < > 19  < > 14  < > '12 19 14  '$ CREATININE 0.80  < > 0.76 0.65  < > 0.65  < > 0.58  < > 0.90 0.84 0.87  CALCIUM  --   < > 7.7* 7.8*  < > 8.3*  < > 8.5  < > 9.7 9.2 9.1  MG  --   < > 2.2 2.1  --  2.2  --  2.0  --   --   --   --   PHOS  --   --  1.6* 2.0*  --   --   --   --   --   --   --   --   TSH 0.976  --   --   --   --   --   --   --   --   --   --   --   < > = values in this interval not displayed. Liver Function Tests:  Recent Labs  04/06/14 0302 04/07/14 0258 04/11/14 0450 05/05/14 07/27/14 0837 01/28/15 0828  AST 41* 40* '27 23 25 21  '$ ALT 42 39 '26 28 28 18  '$ ALKPHOS 96 117 101 107 88 80  BILITOT 0.8 0.9 0.6  --  0.3 0.5  PROT 5.6* 5.7* 6.2  --  7.4 6.5  ALBUMIN 2.2* 2.3* 2.4*  --   --   --    No results for input(s): LIPASE, AMYLASE in the last 8760 hours. No results for input(s): AMMONIA in the last 8760 hours. CBC:  Recent Labs  04/11/14 0450  07/27/14 0837 01/28/15 0828 02/25/15 0950 03/10/15 0800  WBC 12.2*  < > 6.8 7.4 10.5 6.6  NEUTROABS 10.3*  --  4.6 5.2  --   --   HGB 16.1  < > 17.6  --  18.0* 17.7*  HCT 47.6  < > 53.2* 50.3 51.9 51.7  MCV 96.9  < > 100*  --  96.8 96.8  PLT 384  < > 236  --  204 199  < > = values in this interval not displayed. Lipid Panel:  Recent Labs  07/27/14 0837 01/28/15 0828  CHOL 181 159  HDL 51 41  LDLCALC  99 86  TRIG 154* 160*  CHOLHDL 3.5 3.9   TSH:  Recent Labs  04/01/14 2018  TSH 0.976   A1C: Lab Results  Component Value Date   HGBA1C 5.3 07/27/2014     Assessment/Plan  1. Candidiasis -improved, to cont  - nystatin (MYCOSTATIN/NYSTOP) 100000 UNIT/GM POWD; Apply to groin 1-2 times daily  Dispense: 30 g; Refill: 3  2. Soft tissue abscess of inguinal region Healing well. No drainage or erythema.   3. Elevated PSA -pt taking OTC  supplement, PSA was trending now, will follow up with next visit in January.  - PSA; Future   Taaj Hurlbut K. Harle Battiest  [facility] [phone] 8 am - 5 pm) [phone] (after hours)

## 2015-03-22 NOTE — Patient Instructions (Signed)
Cont to use nystatin cream for the rest of the week then start nystatin powder 1-2 times daily    Keep follow up in January

## 2015-03-24 ENCOUNTER — Telehealth: Payer: Self-pay | Admitting: Pulmonary Disease

## 2015-03-24 NOTE — Telephone Encounter (Signed)
#   provided to call is not correct. Called path at [phone] and spoke with Dr. Chriss Czar. He reports they sent the case to [facility] and is still awaiting to hear back for another opinion. Once he hears back then he will let us know.

## 2015-03-24 NOTE — Telephone Encounter (Signed)
Per Dr. Ashok Cordia, results are not back yet and we can cancel appt. Once we get the results, we will call to r/s appt. appt has been cancelled and pt is aware. Nothing further needed

## 2015-03-24 NOTE — Telephone Encounter (Signed)
Called pathology lab and it shows it is still pending stains. She is going to look further into this and call us back.

## 2015-03-24 NOTE — Telephone Encounter (Signed)
Tammy with [facility] called back and may be reached at [phone].

## 2015-03-28 ENCOUNTER — Ambulatory Visit: Payer: Medicare HMO | Admitting: Pulmonary Disease

## 2015-04-01 ENCOUNTER — Encounter: Payer: Self-pay | Admitting: Cardiology

## 2015-04-01 ENCOUNTER — Ambulatory Visit (INDEPENDENT_AMBULATORY_CARE_PROVIDER_SITE_OTHER): Payer: Medicare HMO | Admitting: Cardiology

## 2015-04-01 VITALS — BP 122/64 | HR 73 | Ht 67.0 in | Wt 211.0 lb

## 2015-04-01 DIAGNOSIS — I480 Paroxysmal atrial fibrillation: Secondary | ICD-10-CM

## 2015-04-01 DIAGNOSIS — I10 Essential (primary) hypertension: Secondary | ICD-10-CM

## 2015-04-01 DIAGNOSIS — Z720 Tobacco use: Secondary | ICD-10-CM

## 2015-04-01 DIAGNOSIS — E669 Obesity, unspecified: Secondary | ICD-10-CM

## 2015-04-01 DIAGNOSIS — F17200 Nicotine dependence, unspecified, uncomplicated: Secondary | ICD-10-CM

## 2015-04-01 MED ORDER — ASPIRIN EC 81 MG PO TBEC: 81.0000 mg | DELAYED_RELEASE_TABLET | Freq: Every day | ORAL | Status: DC

## 2015-04-01 NOTE — Progress Notes (Signed)
[facility]. [address], [place]  [address] Phone: [phone] Fax:  [phone]  Date:  04/01/2015   ID:  [name], DOB 01/02/1941, MRN [id number]  PCP:  [name], NP   History of Present Illness: [name] is a 74 y.o. male hospitalization in September 2015, atrial fibrillation with rapid ventricular response in the setting of infection who converted to normal sinus rhythm on amiodarone, diltiazem, previously chronic anticoagulation with Eliquis, normal echocardiogram with bacteremia, transesophageal echocardiogram showing no vegetations, reassuring.   His hospitalization, worse days of his life. Terrible back pain. Currently has osteomyelitis and will complete antibiotic therapy in 2 months. Dr. Johnnye Sima with infectious disease had been seeing him. Physical therapy.  MRI of back septic joints.  He has not had any further episodes of atrial fibrillation. 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 No palpitations.  It is certainly plausible that his atrial fibrillation had an underlying reversible cause which was his severe infectious state in the hospital.  Took ASA for years and did not have an issue. ?reaction to Eliquis. After stopping his Eliquis, he took a low-dose aspirin and he said that approximately 2 hours after developed some left eye swelling, some lip swelling. I told him stop it at this point. I am skeptical however that having taken aspirin for so many years that he would've suddenly developed an allergy to it. This could have been a coincidence with something else was in allergen.  Wt Readings from Last 3 Encounters:  04/01/15 211 lb (95.709 kg)  03/22/15 210 lb 6.4 oz (95.437 kg)  03/10/15 208 lb (94.348 kg)     Past Medical History  Diagnosis Date  . Acute appendicitis with peritoneal abscess 01/28/2013  . Ileus, postoperative 01/28/2013  . Tobacco use disorder 01/28/2013  . Obesity, morbid 01/28/2013  . Shortness of breath   . Essential  hypertension 04/16/2014  . Elevated PSA   . Bacteremia September 2015    Past Surgical History  Procedure Laterality Date  . Back surgery  1997, 2003    Dr.Apleton   . Laparoscopic appendectomy N/A 01/25/2013    Procedure: APPENDECTOMY LAPAROSCOPIC;  Surgeon: Ralene Ok, MD;  Location: [facility];  Service: General;  Laterality: N/A;  . Tee without cardioversion N/A 04/07/2014    Procedure: TRANSESOPHAGEAL ECHOCARDIOGRAM (TEE);  Surgeon: Candee Furbish, MD;  Location: [facility];  Service: Cardiovascular;  Laterality: N/A;  . Shoulder surgery Right 2014    Dr.Whitfield   . Knee surgery Bilateral     Current Outpatient Prescriptions  Medication Sig Dispense Refill  . hydrALAZINE (APRESOLINE) 25 MG tablet Take one tablet by mouth three times daily for blood pressure (Patient taking differently: Take 25 mg by mouth 3 (three) times daily. Take one tablet by mouth three times daily for blood pressure) 90 tablet 5  . losartan (COZAAR) 50 MG tablet TAKE 1 TABLET BY MOUTH ONCE DAILY FOR BLOOD PRESSURE 30 tablet 5  . nystatin (MYCOSTATIN/NYSTOP) 100000 UNIT/GM POWD Apply to groin 1-2 times daily 30 g 3  . UNABLE TO FIND Take 1 capsule by mouth 2 (two) times daily. Prosvent     No current facility-administered medications for this visit.    Allergies:    Allergies  Allergen Reactions  . Asa [Aspirin] Swelling    Social History:  The patient  reports that he has been smoking Cigars.  He started smoking about 56 years ago. He has never used smokeless tobacco. He  reports that he does not drink alcohol or use illicit drugs.   No early family history of coronary artery disease  ROS:  Please see the history of present illness.   Denies any syncope, bleeding, orthopnea, PND   All other systems reviewed and negative.   PHYSICAL EXAM: VS:  BP 122/64 mmHg  Pulse 73  Ht '5\' 7"'$  (1.702 m)  Wt 211 lb (95.709 kg)  BMI 33.04 kg/m2  SpO2 96% Well nourished, well developed, in no acute distress HEENT:  normal, /AT, EOMI Neck: no JVD, normal carotid upstroke, no bruit Cardiac:  normal S1, S2; RRR; no murmur Lungs:  clear to auscultation bilaterally, no wheezing, rhonchi or rales Abd: soft, nontender, no hepatomegaly, no bruitsoverweight Ext: no edema, 2+ distal pulses Skin: warm and dry GU: deferred Neuro: no focal abnormalities noted, AAO x 3  EKG: 11/29/14-sinus rhythm, 75, vertical axis ,borderline interventricular conduction delay. 08/12/14-normal rhythm, left axis deviation, vertical axis, poor R-wave progression. QTC 473 ms.    Labs: 07/27/14-liver functions normal, creatinine 0.9, hemoglobin 17.6. TSH from 04/01/14 was 0.9.  ECHO: 55% on TEE.   ASSESSMENT AND PLAN:  1. Paroxysmal atrial fibrillation-converted on amiodarone during hospitalization in September 2015 in the setting of bacteremia. Ultimately, it is possible that he had a reversible cause for his atrial fibrillation i.e. severe infection. Since he is demonstrated normal sinus rhythm since conversion, we have had discussion previously about discontinuation of both amiodarone as well as Eliquis. It is not unreasonable given the possible underlying cause of infection. Of course, there is always a risk that he would return in to atrial fibrillation unmonitored and potential stroke risk is present. Lab work reassuring.he will let me know if he feels any symptoms of atrial fibrillation, palpitations.  2. Chronic anticoagulation- discontinued Eliquis. restarting low-dose aspirin again. I am doubtful that he actually had a true allergy to aspirin given that he was taking it for so many years previously. We will try again. If a similar swelling occurs, discontinue aspirin. 3. Osteomyelitis-Dr. Hatcher's note reviewed. Antibiotics discontinued . Resolved. Recent groin abscess however. 4. Tobacco use - urge to smoke after eating. Cut back. Cessation.  5. Obesity-continue to encourage weight loss. Risk factor for developing atrial  fibrillation once again. 6. Essential hypertension-normal. Well controlled 7. When necessary follow-up.  Signed, Candee Furbish, MD [facility]  04/01/2015 8:26 AM

## 2015-04-01 NOTE — Patient Instructions (Signed)
Medication Instructions:  Please try to take ASA 81 mg a day. Continue all other medications as listed.  Follow-Up: Follow up as needed with Dr Marlou Porch.  Thank you for choosing [facility]!!

## 2015-04-04 ENCOUNTER — Telehealth: Payer: Self-pay | Admitting: Pulmonary Disease

## 2015-04-04 ENCOUNTER — Other Ambulatory Visit: Payer: Self-pay | Admitting: Pulmonary Disease

## 2015-04-04 DIAGNOSIS — C3492 Malignant neoplasm of unspecified part of left bronchus or lung: Secondary | ICD-10-CM

## 2015-04-04 NOTE — Telephone Encounter (Signed)
Spoke with the patient regarding final results of biopsy. Plan for a referral to medical oncology & thoracic surgery for possible resection. Plan to discuss at thoracic oncology conference this Thursday.

## 2015-04-11 ENCOUNTER — Encounter (HOSPITAL_COMMUNITY): Payer: Self-pay

## 2015-04-14 ENCOUNTER — Telehealth: Payer: Self-pay | Admitting: Cardiology

## 2015-04-14 ENCOUNTER — Encounter: Payer: Self-pay | Admitting: Cardiothoracic Surgery

## 2015-04-14 ENCOUNTER — Institutional Professional Consult (permissible substitution) (INDEPENDENT_AMBULATORY_CARE_PROVIDER_SITE_OTHER): Payer: Medicare HMO | Admitting: Cardiothoracic Surgery

## 2015-04-14 VITALS — BP 146/66 | HR 87 | Resp 18 | Ht 67.0 in | Wt 210.0 lb

## 2015-04-14 DIAGNOSIS — I4891 Unspecified atrial fibrillation: Secondary | ICD-10-CM

## 2015-04-14 DIAGNOSIS — C3432 Malignant neoplasm of lower lobe, left bronchus or lung: Secondary | ICD-10-CM

## 2015-04-14 DIAGNOSIS — Z01818 Encounter for other preprocedural examination: Secondary | ICD-10-CM

## 2015-04-14 DIAGNOSIS — R931 Abnormal findings on diagnostic imaging of heart and coronary circulation: Secondary | ICD-10-CM

## 2015-04-14 NOTE — Patient Instructions (Signed)
Lung Resection A lung resection is a procedure to remove part or all of a lung. When an entire lung is removed, the procedure is called a pneumonectomy. When only part of a lung is removed, the procedure is called a lobectomy. A lung resection is typically done to get rid of a tumor or cancer, but it may be done to treat other conditions. This procedure can help relieve some or all of your symptoms and can also help keep the problem from getting worse. Lung resection may provide the best chance for curing your disease. However, the procedure may not necessarily cure lung cancer if that is the problem.  LET YOUR HEALTH CARE PROVIDER KNOW ABOUT:   Any allergies you have.  All medicines you are taking, including vitamins, herbs, eye drops, creams, and over-the-counter medicines.  Previous problems you or members of your family have had with the use of anesthetics.  Any blood disorders you have.  Previous surgeries you have had.  Medical conditions you have. RISKS AND COMPLICATIONS  Generally, lung resection is a safe procedure. However, problems can occur and include:  Excessive bleeding.  Infection.  Inability to breathe without a ventilator.  Persistent shortness of breath.  Heart problems, including abnormal rhythms and a risk of heart attack or heart failure.  Blood clots.  Injury to a blood vessel.  Injury to a nerve.  Failure to heal properly.  Stroke.  Bronchopleural fistula. This is a small hole between one of the main breathing tubes (bronchus) and the lining of the lungs. This is rare.  Reaction to anesthesia. BEFORE THE PROCEDURE  You may have tests done before the procedure, including:  Blood tests.  Urine tests.  X-rays.  Other imaging tests (such as CT scans, MRI scans, and PET scans). These tests are done to find the exact size and location of the condition being treated with this surgery.  Pulmonary function tests. These are breathing tests to assess  the function of your lungs before surgery and to decide how to best help your breathing after surgery.  Heart testing. This is done to make sure your heart is strong enough for the procedure.  Bronchoscopy. This is a technique that allows your health care provider to look at the inside of your airways. This is done using a soft, flexible tube (bronchoscope). Along with imaging tests, this can help your health care provider know the exact location and size of the area that will be removed during surgery.  Lymph node sampling. This may need to be done to see if the tumor has spread. It may be done as a separate surgery or right before your lung resection procedure. PROCEDURE  An IV tube will be placed in your arm. You will be given a medicine that makes you fall asleep (general anesthetic). You may also get pain medicine through a thin, flexible tube (catheter) in your back.  A breathing tube will be placed in your throat.  Once the surgical team has prepared you for surgery, your surgeon will make an incision on your side. Some resections are done through large incisions, while others can be done through small incisions using smaller instruments and assisted with small cameras (laparoscopic surgery).  Your surgeon will carefully cut the veins, arteries, and bronchus leading to your lung. After being cut, each of these pieces will be sewn or stapled closed. The lung or part of the lung will then be removed.  Your surgeon will check inside your chest to make   sure there is no bleeding in or around the lungs. Lymph nodes near the lung may also be removed for later tests.  Your surgeon may put tubes into your chest to drain extra fluid and air after surgery.  Your incision will be closed. This may be done using:  Stitches that absorb into your body and do not need to be removed.  Stitches that must be removed.  Staples that must be removed. AFTER THE PROCEDURE   You will be taken to the  recovery area and your progress will be monitored. You may still have a breathing tube and other tubes or catheters in your body immediately after surgery. These will be removed during your recovery. You may be put on a respirator following surgery if some assistance is needed to help your breathing. When you are awake and not experiencing immediate problems from surgery, you will be moved to the intensive care unit (ICU) where you will continue your recovery.  You may feel pain in your chest and throat. Sometimes during recovery, patients may shiver or feel nauseous. You will be given medicine to help with pain and nausea.  The breathing tube will be taken out as soon as your health care providers feel you can breathe on your own. For most people, this happens on the same day as the surgery.  If your surgery and time in the ICU go well, most of the tubes and equipment will be taken out within 1-2 days after surgery. This is about how long most people stay in the ICU. You may need to stay longer, depending on how you are doing.  You should also start respiratory therapy in the ICU. This therapy uses breathing exercises to help your other lung stay healthy and get stronger.  As you improve, you will be moved to a regular hospital room for continued respiratory therapy, help with your bladder and bowels, and to continue medicines.  After your lung or part of your lung is taken out, there will be a space inside your chest. This space will often fill up with fluid over time. The amount of time this takes is different for each person.  You will receive care until you are doing well and your health care provider feels it is safe for you to go home or to transfer to an extended care facility. Document Released: 09/22/2002 Document Revised: 11/16/2013 Document Reviewed: 08/21/2013 ExitCare Patient Information 2015 [employer]. This information is not intended to replace advice given to you by your health  care provider. Make sure you discuss any questions you have with your health care provider.Lung Resection, Care After Refer to this sheet in the next few weeks. These instructions provide you with information on caring for yourself after your procedure. Your health care provider may also give you more specific instructions. Your treatment has been planned according to current medical practices, but problems sometimes occur. Call your health care provider if you have any problems or questions after your procedure. WHAT TO EXPECT AFTER THE PROCEDURE After your procedure, it is typical to have the following:   You may feel pain in your chest and throat.  Patients may sometimes shiver or feel nauseous during recovery. HOME CARE INSTRUCTIONS  You may resume a normal diet and activities as directed by your health care provider.  Do not use any tobacco products, including cigarettes, chewing tobacco, or electronic cigarettes. If you need help quitting, ask your health care provider.  There are many different ways   to close and cover an incision, including stitches, skin glue, and adhesive strips. Follow your health care provider's instructions on:  Incision care.  Bandage (dressing) changes and removal.  Incision closure removal.  Take medicines only as directed by your health care provider.  Keep all follow-up visits as directed by your health care provider. This is important.  Try to breathe deeply and cough as directed. Holding a pillow firmly over your ribs may help with discomfort.  If you were given an incentive spirometer in the hospital, continue to use it as directed by your health care provider.  Walk as directed by your health care provider.  You may take a shower and gently wash the area of your incision with water and soap as directed by your health care provider. Do not use anything else to clean your incision except as directed by your health care provider. Do not take baths,  swim, or use a hot tub until your health care provider approves. SEEK MEDICAL CARE IF:  You notice redness, swelling, or increasing pain at the incision site.  You are bleeding at the incision site.  You see pus coming from the incision site.  You notice a bad smell coming from the incision site or bandage.  Your incision breaks open.  You cough up blood or pus, or you develop a cough that produces bad-smelling sputum.  You have pain or swelling in your legs.  You have increasing pain that is not controlled with medicine.  You have trouble managing any of the tubes that have been left in place after surgery.  You have fever or chills. SEEK IMMEDIATE MEDICAL CARE IF:   You have chest pain or an irregular or rapid heartbeat.  You have dizzy episodes or faint.  You have shortness of breath or difficulty breathing.  You have persistent nausea or vomiting.  You have a rash. Document Released: 01/19/2005 Document Revised: 11/16/2013 Document Reviewed: 08/21/2013 ExitCare Patient Information 2015 [employer]. This information is not intended to replace advice given to you by your health care provider. Make sure you discuss any questions you have with your health care provider.  

## 2015-04-14 NOTE — Progress Notes (Signed)
[address]       [place] [address]             [phone]                    [name] [place] Medical Record #[id number] Date of Birth: Feb 21, 1941  Referring: Javier Glazier, MD Primary Care: [name], NP  Chief Complaint:    Chief Complaint  Patient presents with  . Lung Cancer    eval and treat..CT CHEST 08/05/14, PET 03/07/15, BX 03/10/15 and PFT 03/08/15    History of Present Illness:    [name] 74 y.o. 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 The patient has been a long-term smoker he is also had work exposure to asbestos. He was hospitalized approximately year ago with significant infection sepsis and discitis, ultimately he was discharged to a nursing facility but now has returned home and is fully caring for himself. He notes he played cough yesterday and today. In January of this year he had a low dose CT scan of the chest without any lung nodules. In July while working around his house he developed discomfort in his right groin and thought he potentially had a hernia he was seen in the emergency room area of groin infection was drained a CT scan of the abdomen revealed a left lower lobe lung nodule this is been further evaluated with a PET scan and a CT directed needle biopsy. 10 months ago the lesion was not present on his x-ray. The patient the patient had a needle biopsy of the left lower lobe lesion done on 03/10/2015. Pulmonary function studies were performed. The patient remains relatively active. He has had episodes of paroxysmal atrial fibrillation in the past and is followed by Dr. Luther Parody cardiology.      Current Activity/ Functional Status:  Patient is independent with mobility/ambulation, transfers, ADL's, IADL's.    Zubrod Score: At the time of surgery this patient's most appropriate activity status/level should be described as: '[]'$     0    Normal activity, no  symptoms '[x]'$     1    Restricted in physical strenuous activity but ambulatory, able to do out light work '[]'$     2    Ambulatory and capable of self care, unable to do work activities, up and about               >50 % of waking hours                              '[]'$     3    Only limited self care, in bed greater than 50% of waking hours '[]'$     4    Completely disabled, no self care, confined to bed or chair '[]'$     5    Moribund   Past Medical History  Diagnosis Date  . Acute appendicitis with peritoneal abscess 01/28/2013  . Ileus, postoperative 01/28/2013  . Tobacco use disorder 01/28/2013  . Obesity, morbid 01/28/2013  . Shortness of breath   . Essential hypertension 04/16/2014  . Elevated PSA   . Bacteremia September 2015    Past Surgical History  Procedure Laterality Date  . Back surgery  1997, 2003    Dr.Apleton   . Laparoscopic appendectomy N/A 01/25/2013    Procedure: APPENDECTOMY  LAPAROSCOPIC;  Surgeon: Ralene Ok, MD;  Location: [facility];  Service: General;  Laterality: N/A;  . Tee without cardioversion N/A 04/07/2014    Procedure: TRANSESOPHAGEAL ECHOCARDIOGRAM (TEE);  Surgeon: Candee Furbish, MD;  Location: [facility];  Service: Cardiovascular;  Laterality: N/A;  . Shoulder surgery Right 2014    Dr.Whitfield   . Knee surgery Bilateral     Family History  Problem Relation Age of Onset  . Irritable bowel syndrome Mother   . Cancer Neg Hx   . Lung disease Neg Hx     Social History   Social History  . Marital Status: Divorced    Spouse Name: N/A  . Number of Children: 0  . Years of Education: N/A   Occupational History  . Retired    Social History Main Topics  . Smoking status: Current Every Day Smoker -- 1.50 packs/day for 50 years    Types: Cigars, Cigarettes    Start date: 07/16/1958  . Smokeless tobacco: Never Used     Comment: Peak use 2ppd of cigarettes  . Alcohol Use: No  . Drug Use: No  . Sexual Activity: Not Currently   Other Topics Concern  . Not on  file   Social History Narrative   No diet   Yes, eats/drinks things with caffeine    Divorced, married 1979   Lives in a house, one stories , one person, no pets   Current/past profession- [employer]   Patient exercises, golf 3 days weekly        Originally from [place]. Previously lived in [facility] for 23 years and moved back to [place] in 1968. No international travel. He has traveled through multiple states traveling to [place]. Previously has worked in [employer] and also a [employer] running a [employer]. Unsure if he was exposed to any asbestos. No mold exposure. No pets currently. Previously has owned dogs. Parakeet as a child. No hot tub exposure. Enjoys golfing.    History  Smoking status  . Current Every Day Smoker -- 1.50 packs/day for 50 years  . Types: Cigars, Cigarettes  . Start date: 07/16/1958  Smokeless tobacco  . Never Used    Comment: Peak use 2ppd of cigarettes    History  Alcohol Use No     Allergies  Allergen Reactions  . Asa [Aspirin] Swelling    Current Outpatient Prescriptions  Medication Sig Dispense Refill  . aspirin EC 81 MG tablet Take 1 tablet (81 mg total) by mouth daily. 90 tablet 3  . hydrALAZINE (APRESOLINE) 25 MG tablet Take one tablet by mouth three times daily for blood pressure (Patient taking differently: Take 25 mg by mouth 3 (three) times daily. Take one tablet by mouth three times daily for blood pressure) 90 tablet 5  . losartan (COZAAR) 50 MG tablet TAKE 1 TABLET BY MOUTH ONCE DAILY FOR BLOOD PRESSURE 30 tablet 5  . nystatin (MYCOSTATIN/NYSTOP) 100000 UNIT/GM POWD Apply to groin 1-2 times daily 30 g 3  . UNABLE TO FIND Take 1 capsule by mouth 2 (two) times daily. Prosvent     No current facility-administered medications for this visit.      Review of Systems:     Cardiac Review of Systems: Y or N  Chest Pain [ n   ]  Resting SOB [  n ] Exertional SOB  [ y ]  Orthopnea [ n ]   Pedal Edema [ n  ]     Palpitations [n  ]  Syncope  [ n ]   Presyncope [  n ]  General Review of Systems: [Y] = yes [  ]=no Constitional: recent weight change [ n ];  Wt loss over the last 3 months [   ] anorexia [  ]; fatigue [  ]; nausea [  ]; night sweats [  ]; fever [ n ]; or chills [ n ];          Dental: poor dentition[ n ]; Last Dentist visit:   Eye : blurred vision [  ]; diplopia [   ]; vision changes [  ];  Amaurosis fugax[  ]; Resp: cough [ y ];  wheezing[ n];  hemoptysis[n ]; shortness of breath[  n]; paroxysmal nocturnal dyspnea[  n]; dyspnea on exertion[  ]; or orthopnea[  ];  GI:  gallstones[  ], vomiting[  ];  dysphagia[  ]; melena[  ];  hematochezia [  ]; heartburn[  ];   Hx of  Colonoscopy[ n ]; GU: kidney stones [  ]; hematuria[  ];   dysuria [  ];  nocturia[  ];  history of     obstruction [  ]; urinary frequency Blue.Reese  ]             Skin: rash, swelling[  ];, hair loss[  ];  peripheral edema[  ];  or itching[  ]; Musculosketetal: myalgias[  ];  joint swelling[  ];  joint erythema[  ];  joint pain[  ];  back pain[  ];  Heme/Lymph: bruising[  ];  bleeding[  ];  anemia[  ];  Neuro: TIA[ n ];  headaches[  ];  stroke[ n ];  vertigo[  ];  seizures[ n ];   paresthesias[  ];  difficulty walking[  ];  Psych:depression[  ]; anxiety[  ];  Endocrine: diabetes[ n ];  thyroid dysfunction[ n ];  Immunizations: Flu up to date [n  ]; Pneumococcal up to date [ n ];  Other:  Physical Exam: BP 146/66 mmHg  Pulse 87  Resp 18  Ht '5\' 7"'$  (1.702 m)  Wt 210 lb (95.255 kg)  BMI 32.88 kg/m2  SpO2 97%  PHYSICAL EXAMINATION: General appearance: alert, cooperative and no distress Head: Normocephalic, without obvious abnormality, atraumatic Neck: no adenopathy, no carotid bruit, no JVD, supple, symmetrical, trachea midline and thyroid not enlarged, symmetric, no tenderness/mass/nodules Lymph nodes: Cervical, supraclavicular, and axillary nodes normal. Resp: clear to auscultation bilaterally Back: symmetric, no curvature.  ROM normal. No CVA tenderness. Cardio: regular rate and rhythm, S1, S2 normal, no murmur, click, rub or gallop GI: soft, non-tender; bowel sounds normal; no masses,  no organomegaly Extremities: extremities normal, atraumatic, no cyanosis or edema and Homans sign is negative, no sign of DVT Neurologic: Grossly normal  Diagnostic Studies & Laboratory data:     Recent Radiology Findings:    CLINICAL DATA: 74 year old male current smoker with 50 pack-year history of smoking. Lung cancer screening examination.  EXAM: CT CHEST WITHOUT CONTRAST  TECHNIQUE: Multidetector CT imaging of the chest was performed following the standard protocol without IV contrast.  COMPARISON: Chest CT 08/23/2005.  FINDINGS: Mediastinum/Lymph Nodes: Heart size is normal. There is no significant pericardial fluid, thickening or pericardial calcification. There is atherosclerosis of the thoracic aorta, the great vessels of the mediastinum and the coronary arteries, including calcified atherosclerotic plaque in the left anterior descending, left circumflex and right coronary arteries. Calcifications of the mitral annulus. No pathologically enlarged mediastinal or hilar lymph nodes. Please note that  accurate exclusion of hilar adenopathy is limited on noncontrast CT scans. Esophagus is unremarkable in appearance. No axillary lymphadenopathy.  Lungs/Pleura: There are a few scattered tiny pulmonary nodules in the lungs bilaterally, largest of which measures only 5 x 4 mm (mean diameter 4.5 mm) in the periphery of the right upper lobe (image 75 of series 3). No other larger more suspicious appearing pulmonary nodules or masses are otherwise noted. Mild diffuse bronchial wall thickening. Mild centrilobular and paraseptal emphysema. No acute consolidative airspace disease. No pleural effusions. Minimal bibasilar subsegmental atelectasis.  Musculoskeletal/Soft Tissues: There are no aggressive  appearing lytic or blastic lesions noted in the visualized portions of the skeleton.  Upper Abdomen: 2.2 cm low-attenuation lesion in segment 2 of the liver is incompletely characterized on today's noncontrast CT examination, but is unchanged in size compared to remote prior study 08/23/2005, and is considered benign, presumably a cyst.  IMPRESSION: 1. Lung-RADS Category 2, benign appearance or behavior. Continue annual screening with low-dose chest CT without contrast in 12 months. 2. Mild diffuse bronchial wall thickening with mild centrilobular and paraseptal emphysema; imaging findings suggestive of underlying COPD. 3. Atherosclerosis, including 3 vessel coronary artery disease. Assessment for potential risk factor modification, dietary therapy or pharmacologic therapy may be warranted, if clinically indicated. 4. Additional incidental findings, as above.   Electronically Signed  By: Vinnie Langton M.D.  On: 08/05/2014 11:52  CLINICAL DATA: Initial treatment strategy for pulmonary nodules.  EXAM: NUCLEAR MEDICINE PET SKULL BASE TO THIGH  TECHNIQUE: 7.09 mCi F-18 FDG was injected intravenously. Full-ring PET imaging was performed from the skull base to thigh after the radiotracer. CT data was obtained and used for attenuation correction and anatomic localization.  FASTING BLOOD GLUCOSE: Value: 104 mg/dl  COMPARISON: Screening chest CT 08/05/2014. Abdominal pelvic CTs 04/01/2014 and 02/24/2005.  FINDINGS: NECK  No hypermetabolic cervical lymph nodes are identified.There are no lesions of the pharyngeal mucosal space.  CHEST  There are no hypermetabolic mediastinal, hilar or axillary lymph nodes. The dominant left lower lobe nodule demonstrated on recent abdominal CT is hypermetabolic. This nodule measures 2.0 x 1.8 cm on image 46 and has an SUV max of 13.2. No other hypermetabolic pulmonary activity demonstrated. Additional scattered tiny  nodules are unchanged from prior chest CT, including a 4 mm right upper lobe nodule on image number 20 and tiny nodules in the right middle lobe on image 38 and in the lingula on image 49.  ABDOMEN/PELVIS  There is no hypermetabolic activity within the liver, adrenal glands, spleen or pancreas. There is no hypermetabolic nodal activity. There is hypermetabolic activity in the right groin corresponding with presumed inflammatory changes on recent CT. This has an SUV max of 9.6. Hepatic steatosis, a probable cyst in the left hepatic lobe, diffuse atherosclerosis and a left scrotal hydrocele noted.  SKELETON  There is no hypermetabolic activity to suggest osseous metastatic disease. Several sebaceous cysts are noted within the back, larger on the right. There is no associated abnormal metabolic activity.  IMPRESSION: 1. The dominant left lower lobe nodule is hypermetabolic with malignant range metabolic activity worrisome for malignancy. Of note, there was no abnormality in this area on the screening chest CT performed only 7 months ago. Therefore, infection is a possible explanation for this finding. Correlate clinically. 2. The other tiny nodules seen on prior screening chest CT are unchanged. 3. No other worrisome activity. No evidence of metastatic disease. 4. Presumed inflammatory/postsurgical changes in the right groin.   Electronically Signed  By:  Richardean Sale M.D.  On: 03/07/2015 08:56 I have independently reviewed the above radiologic studies.  Recent Lab Findings: Lab Results  Component Value Date   WBC 6.6 03/10/2015   HGB 17.7* 03/10/2015   HCT 51.7 03/10/2015   PLT 199 03/10/2015   GLUCOSE 113* 02/25/2015   CHOL 159 01/28/2015   TRIG 160* 01/28/2015   HDL 41 01/28/2015   LDLCALC 86 01/28/2015   ALT 18 01/28/2015   AST 21 01/28/2015   NA 136 02/25/2015   K 4.0 02/25/2015   CL 103 02/25/2015   CREATININE 0.87 02/25/2015   BUN 14 02/25/2015    CO2 23 02/25/2015   TSH 0.976 04/01/2014   INR 1.06 03/10/2015   HGBA1C 5.3 07/27/2014   PFT' FEV1: 2.34  86%  DLCO 24.38 90% Diagnosis Lung, needle/core biopsy(ies), left lower lobe - SARCOMATOID CARCINOMA, SEE COMMENT. Microscopic Comment The case was submitted to [facility] Department of Pathology for outside opinion (see outside pathology report, [phone]). (CRR:ecj 04/01/2015) Kurt RUND DO Pathologist, Electronic Signature (Case signed 04/01/2015)  Assessment / Plan:   New left lower lobe lung lesion biopsy proven sarcomatoid carcinoma Atherosclerosis, including 3 vessel coronary artery disease.  Long term smoking history-I've spent 7-10 minutes discussing with the patient the reasons to stop smoking preoperatively and given him printed information about smoking in the perioperative period   I've reviewed with the patient his CT scan and findings and diagnosis and recommended that the highest chance of cure would be by surgical resection preferably left lower lobectomy the risks and options of this were discussed with patient in detail. Since he he is followed by Dr. Luther Parody and on his CT scan does have evidence of three-vessel coronary artery disease we will have preoperative clearance done before surgery. Tentatively plan to proceed with bronchoscopy left the assisted thoracoscopy left lower lobectomy and node dissection on October 11.    I  spent 40 minutes counseling the patient face to face and 50% or more the  time was spent in counseling and coordination of care. The total time spent in the appointment was 60 minutes.  Grace Isaac MD      [address] [place] [address] Office [phone]   Beeper [id number]  04/14/2015 4:43 PM

## 2015-04-14 NOTE — Telephone Encounter (Signed)
.  Request for surgical clearance:  1. What type of surgery is being performed? Bronchoscopy left vats, lung resection   2. When is this surgery scheduled? 04/25/2015  3. Are there any medications that need to be held prior to surgery and how long? Nothing written but please let them know   4. Name of physician performing surgery?  Lanelle Bal  What is your office phone and fax number? Phone [phone] Fax [phone]

## 2015-04-15 ENCOUNTER — Other Ambulatory Visit: Payer: Self-pay | Admitting: *Deleted

## 2015-04-15 DIAGNOSIS — R911 Solitary pulmonary nodule: Secondary | ICD-10-CM

## 2015-04-15 NOTE — Telephone Encounter (Signed)
Please order NUC stress, Lexiscan. Pre op lung resection. For further risk stratification.  History of AFIB, Tob use. Coronary calcium on CT  Candee Furbish, MD

## 2015-04-18 ENCOUNTER — Encounter (HOSPITAL_COMMUNITY): Payer: Medicare HMO

## 2015-04-18 ENCOUNTER — Telehealth (HOSPITAL_COMMUNITY): Payer: Self-pay | Admitting: *Deleted

## 2015-04-18 NOTE — Telephone Encounter (Signed)
Patient given detailed instructions per Myocardial Perfusion Study Information Sheet for test on 04/19/15 at 1230. Patient notified to arrive 15 minutes early and that it is imperative to arrive on time for appointment to keep from having the test rescheduled.  If you need to cancel or reschedule your appointment, please call the office within 24 hours of your appointment. Failure to do so may result in a cancellation of your appointment, and a $50 no show fee. Patient verbalized understanding. Izsak Meir, Ranae Palms

## 2015-04-19 ENCOUNTER — Ambulatory Visit (HOSPITAL_COMMUNITY): Payer: Medicare HMO | Attending: Cardiology

## 2015-04-19 DIAGNOSIS — I10 Essential (primary) hypertension: Secondary | ICD-10-CM | POA: Diagnosis not present

## 2015-04-19 DIAGNOSIS — R9439 Abnormal result of other cardiovascular function study: Secondary | ICD-10-CM | POA: Diagnosis not present

## 2015-04-19 DIAGNOSIS — I2510 Atherosclerotic heart disease of native coronary artery without angina pectoris: Secondary | ICD-10-CM | POA: Insufficient documentation

## 2015-04-19 DIAGNOSIS — R931 Abnormal findings on diagnostic imaging of heart and coronary circulation: Secondary | ICD-10-CM

## 2015-04-19 DIAGNOSIS — F17200 Nicotine dependence, unspecified, uncomplicated: Secondary | ICD-10-CM | POA: Insufficient documentation

## 2015-04-19 DIAGNOSIS — R0609 Other forms of dyspnea: Secondary | ICD-10-CM | POA: Insufficient documentation

## 2015-04-19 DIAGNOSIS — I4891 Unspecified atrial fibrillation: Secondary | ICD-10-CM | POA: Diagnosis not present

## 2015-04-19 DIAGNOSIS — Z01818 Encounter for other preprocedural examination: Secondary | ICD-10-CM | POA: Diagnosis present

## 2015-04-19 LAB — MYOCARDIAL PERFUSION IMAGING
CHL CUP NUCLEAR SSS: 2
CSEPPHR: 97 {beats}/min
LHR: 0.41
LV dias vol: 133 mL
LVSYSVOL: 50 mL
Rest HR: 80 {beats}/min
SDS: 0
SRS: 2
TID: 1.16

## 2015-04-19 MED ORDER — TECHNETIUM TC 99M SESTAMIBI GENERIC - CARDIOLITE
10.1000 | Freq: Once | INTRAVENOUS | Status: AC | PRN
  Administered 2015-04-19: 10.1 via INTRAVENOUS

## 2015-04-19 MED ORDER — TECHNETIUM TC 99M SESTAMIBI GENERIC - CARDIOLITE
32.8000 | Freq: Once | INTRAVENOUS | Status: AC | PRN
  Administered 2015-04-19: 32.8 via INTRAVENOUS

## 2015-04-19 MED ORDER — REGADENOSON 0.4 MG/5ML IV SOLN
0.4000 mg | Freq: Once | INTRAVENOUS | Status: AC
  Administered 2015-04-19: 0.4 mg via INTRAVENOUS

## 2015-04-19 NOTE — Telephone Encounter (Signed)
Pt has lexiscan completed today.  Will obtain clearance and send ASAP to Dr Servando Snare.

## 2015-04-20 ENCOUNTER — Encounter (HOSPITAL_COMMUNITY)
Admission: RE | Admit: 2015-04-20 | Discharge: 2015-04-20 | Disposition: A | Discharge status: Home / Self Care | Payer: Medicare HMO | Source: Ambulatory Visit | Attending: Cardiothoracic Surgery | Admitting: Cardiothoracic Surgery

## 2015-04-20 ENCOUNTER — Other Ambulatory Visit (HOSPITAL_COMMUNITY): Payer: Self-pay | Admitting: *Deleted

## 2015-04-20 ENCOUNTER — Other Ambulatory Visit: Payer: Self-pay

## 2015-04-20 ENCOUNTER — Encounter (HOSPITAL_COMMUNITY): Payer: Self-pay

## 2015-04-20 ENCOUNTER — Ambulatory Visit (HOSPITAL_COMMUNITY): Payer: Medicare HMO

## 2015-04-20 VITALS — BP 130/56 | HR 81 | Temp 98.2°F | Resp 18 | Ht 67.0 in | Wt 210.4 lb

## 2015-04-20 DIAGNOSIS — Z0183 Encounter for blood typing: Secondary | ICD-10-CM | POA: Diagnosis not present

## 2015-04-20 DIAGNOSIS — I10 Essential (primary) hypertension: Secondary | ICD-10-CM | POA: Insufficient documentation

## 2015-04-20 DIAGNOSIS — R911 Solitary pulmonary nodule: Secondary | ICD-10-CM

## 2015-04-20 DIAGNOSIS — Z01812 Encounter for preprocedural laboratory examination: Secondary | ICD-10-CM | POA: Diagnosis not present

## 2015-04-20 DIAGNOSIS — Z79899 Other long term (current) drug therapy: Secondary | ICD-10-CM | POA: Diagnosis not present

## 2015-04-20 DIAGNOSIS — R9431 Abnormal electrocardiogram [ECG] [EKG]: Secondary | ICD-10-CM | POA: Diagnosis not present

## 2015-04-20 DIAGNOSIS — Z01818 Encounter for other preprocedural examination: Secondary | ICD-10-CM | POA: Diagnosis present

## 2015-04-20 DIAGNOSIS — I4891 Unspecified atrial fibrillation: Secondary | ICD-10-CM | POA: Diagnosis not present

## 2015-04-20 DIAGNOSIS — C3490 Malignant neoplasm of unspecified part of unspecified bronchus or lung: Secondary | ICD-10-CM | POA: Insufficient documentation

## 2015-04-20 DIAGNOSIS — Z7982 Long term (current) use of aspirin: Secondary | ICD-10-CM | POA: Diagnosis not present

## 2015-04-20 HISTORY — DX: Unspecified atrial fibrillation: I48.91

## 2015-04-20 HISTORY — DX: Malignant neoplasm of unspecified part of unspecified bronchus or lung: C34.90

## 2015-04-20 HISTORY — DX: Unspecified osteoarthritis, unspecified site: M19.90

## 2015-04-20 LAB — URINALYSIS, ROUTINE W REFLEX MICROSCOPIC
Bilirubin Urine: NEGATIVE
Glucose, UA: NEGATIVE mg/dL
Hgb urine dipstick: NEGATIVE
Ketones, ur: NEGATIVE mg/dL
Leukocytes, UA: NEGATIVE
Nitrite: NEGATIVE
Protein, ur: NEGATIVE mg/dL
Specific Gravity, Urine: 1.012 (ref 1.005–1.030)
Urobilinogen, UA: 0.2 mg/dL (ref 0.0–1.0)
pH: 6 (ref 5.0–8.0)

## 2015-04-20 LAB — COMPREHENSIVE METABOLIC PANEL
ALT: 24 U/L (ref 17–63)
AST: 26 U/L (ref 15–41)
Albumin: 4.1 g/dL (ref 3.5–5.0)
Alkaline Phosphatase: 74 U/L (ref 38–126)
Anion gap: 6 (ref 5–15)
BUN: 13 mg/dL (ref 6–20)
CO2: 22 mmol/L (ref 22–32)
Calcium: 9.1 mg/dL (ref 8.9–10.3)
Chloride: 110 mmol/L (ref 101–111)
Creatinine, Ser: 0.73 mg/dL (ref 0.61–1.24)
GFR calc Af Amer: >60 mL/min (ref >60)
GFR calc non Af Amer: >60 mL/min (ref >60)
Glucose, Bld: 85 mg/dL (ref 65–99)
Potassium: 4.3 mmol/L (ref 3.5–5.1)
Sodium: 138 mmol/L (ref 135–145)
Total Bilirubin: 0.6 mg/dL (ref 0.3–1.2)
Total Protein: 7.1 g/dL (ref 6.5–8.1)

## 2015-04-20 LAB — BLOOD GAS, ARTERIAL
Acid-base deficit: 0.5 mmol/L (ref 0.0–2.0)
Bicarbonate: 23.3 mEq/L (ref 20.0–24.0)
Drawn by: 42180
FIO2: 0.21
O2 Saturation: 97.6 %
Patient temperature: 98.6
TCO2: 24.4 mmol/L (ref 0–100)
pCO2 arterial: 36 mmHg (ref 35.0–45.0)
pH, Arterial: 7.427 (ref 7.350–7.450)
pO2, Arterial: 96.3 mmHg (ref 80.0–100.0)

## 2015-04-20 LAB — CBC
HCT: 49.8 % (ref 39.0–52.0)
Hemoglobin: 16.8 g/dL (ref 13.0–17.0)
MCH: 32.8 pg (ref 26.0–34.0)
MCHC: 33.7 g/dL (ref 30.0–36.0)
MCV: 97.3 fL (ref 78.0–100.0)
Platelets: 188 10*3/uL (ref 150–400)
RBC: 5.12 MIL/uL (ref 4.22–5.81)
RDW: 14 % (ref 11.5–15.5)
WBC: 8.8 10*3/uL (ref 4.0–10.5)

## 2015-04-20 LAB — PROTIME-INR
INR: 1 (ref 0.00–1.49)
Prothrombin Time: 13.4 seconds (ref 11.6–15.2)

## 2015-04-20 LAB — SURGICAL PCR SCREEN
MRSA, PCR: NEGATIVE
Staphylococcus aureus: NEGATIVE

## 2015-04-20 LAB — APTT: aPTT: 31 seconds (ref 24–37)

## 2015-04-20 NOTE — Pre-Procedure Instructions (Signed)
[name]  04/20/2015     Your procedure is scheduled on Tuesday, April 26, 2015 at 7:30 AM.   Report to [facility] Entrance "A" Admitting Office at 5:30 AM.   Call this number if you have problems the morning of surgery: [phone]   Any questions prior to day of surgery, please call [phone] between 8 & 4 PM.   Remember:  Do not eat food or drink liquids after midnight Monday, 04/25/15.  Take these medicines the morning of surgery with A SIP OF WATER: Hydralazine   Do not wear jewelry.  Do not wear lotions, powders, or cologne.  You may wear NOT deodorant.  Men may shave face and neck.  Do not bring valuables to the hospital.  [facility] is not responsible for any belongings or valuables.  Contacts, dentures or bridgework may not be worn into surgery.  Leave your suitcase in the car.  After surgery it may be brought to your room.  For patients admitted to the hospital, discharge time will be determined by your treatment team.  Special instructions:  [name] - Preparing for Surgery  Before surgery, you can play an important role.  Because skin is not sterile, your skin needs to be as free of germs as possible.  You can reduce the number of germs on you skin by washing with CHG (chlorahexidine gluconate) soap before surgery.  CHG is an antiseptic cleaner which kills germs and bonds with the skin to continue killing germs even after washing.  Please DO NOT use if you have an allergy to CHG or antibacterial soaps.  If your skin becomes reddened/irritated stop using the CHG and inform your nurse when you arrive at Short Stay.  Do not shave (including legs and underarms) for at least 48 hours prior to the first CHG shower.  You may shave your face.  Please follow these instructions carefully:   1.  Shower with CHG Soap the night before surgery and the                                morning of Surgery.  2.  If you choose to wash your hair, wash your hair  first as usual with your       normal shampoo.  3.  After you shampoo, rinse your hair and body thoroughly to remove the                      Shampoo.  4.  Use CHG as you would any other liquid soap.  You can apply chg directly       to the skin and wash gently with scrungie or a clean washcloth.  5.  Apply the CHG Soap to your body ONLY FROM THE NECK DOWN.        Do not use on open wounds or open sores.  Avoid contact with your eyes, ears, mouth and genitals (private parts).  Wash genitals (private parts) with your normal soap.  6.  Wash thoroughly, paying special attention to the area where your surgery        will be performed.  7.  Thoroughly rinse your body with warm water from the neck down.  8.  DO NOT shower/wash with your normal soap after using and rinsing off       the CHG Soap.  9.  Pat yourself dry with a  clean towel.            10.  Wear clean pajamas.            11.  Place clean sheets on your bed the night of your first shower and do not        sleep with pets.  Day of Surgery  Do not apply any lotions/deodorants the morning of surgery.  Please wear clean clothes to the hospital.    Please read over the following fact sheets that you were given. Pain Booklet, Coughing and Deep Breathing, Blood Transfusion Information, MRSA Information and Surgical Site Infection Prevention

## 2015-04-20 NOTE — Progress Notes (Signed)
Pt has history of Atrial fib (onset last fall when he had Bacteremia). Pt denies any recent chest pain. Pt does have sob with exertion at times, recently diagnosed with left lung cancer. Cardiac clearance noted on nuclear stress test report on 04/19/15 by Dr. Marlou Porch.

## 2015-04-21 ENCOUNTER — Inpatient Hospital Stay (HOSPITAL_COMMUNITY): Admission: RE | Admit: 2015-04-21 | Payer: Medicare HMO | Source: Ambulatory Visit

## 2015-04-21 NOTE — Progress Notes (Signed)
Anesthesia Chart Review:  Pt is 75 year old male scheduled for video bronchoscopy, video assisted thoracoscopy, L lung resection on 04/26/2015 with Dr. Servando Snare.   PCP is Sherrie Mustache, NP. Cardiologist is Dr. Candee Furbish, last office visit 04/01/15.   PMH includes: HTN, atrial fibrillation, lung cancer. Current smoker. BMI 33. S/p laparoscopic appendectomy 01/25/13.   Medications include: ASA, hydralazine, losartan.   Preoperative labs reviewed.    1 view CXR 03/10/2015: No acute findings.   EKG 04/20/2015: Sinus rhythm with marked sinus arrhythmia. Possible LAE, LAD, Inferior infarct, age undetermined. Anterior infarct, age undetermined.   Nuclear stress test 04/19/2015:   The left ventricular ejection fraction is normal (55-65%).  Nuclear stress EF: 62%.  Defect 1: There is a small defect of mild severity present in the apex location.  The study is normal.  This is a low risk study.  Pt has cardiac clearance for surgery from Dr. Marlou Porch in comment on results of stress test.   TEE 04/07/2014:  - Left ventricle: Systolic function was normal. The estimated ejection fraction was in the range of 55% to 60%. Wall motion was normal; there were no regional wall motion abnormalities. - Left atrium: No evidence of thrombus in the atrial cavity or appendage. - Right atrium: No evidence of thrombus in the atrial cavity or appendage. -Impressions: No evidence of endocarditis.  If no changes, I anticipate pt can proceed with surgery as scheduled.   Willeen Cass, FNP-BC [facility]/Anesthesiology Phone: [phone] 04/21/2015 12:20 PM

## 2015-04-22 ENCOUNTER — Other Ambulatory Visit: Payer: Self-pay | Admitting: Nurse Practitioner

## 2015-04-26 ENCOUNTER — Inpatient Hospital Stay (HOSPITAL_COMMUNITY): Payer: Medicare HMO | Admitting: Certified Registered"

## 2015-04-26 ENCOUNTER — Encounter (HOSPITAL_COMMUNITY)
Admission: RE | Disposition: A | Discharge status: Home / Self Care | Payer: Self-pay | Source: Ambulatory Visit | Attending: Cardiothoracic Surgery

## 2015-04-26 ENCOUNTER — Inpatient Hospital Stay (HOSPITAL_COMMUNITY): Payer: Medicare HMO

## 2015-04-26 ENCOUNTER — Inpatient Hospital Stay (HOSPITAL_COMMUNITY)
Admission: RE | Admit: 2015-04-26 | Discharge: 2015-05-03 | DRG: 164 | Disposition: A | Discharge status: Home / Self Care | Payer: Medicare HMO | Source: Ambulatory Visit | Attending: Cardiothoracic Surgery | Admitting: Cardiothoracic Surgery

## 2015-04-26 ENCOUNTER — Encounter (HOSPITAL_COMMUNITY): Payer: Self-pay | Admitting: *Deleted

## 2015-04-26 ENCOUNTER — Inpatient Hospital Stay (HOSPITAL_COMMUNITY): Payer: Medicare HMO | Admitting: Emergency Medicine

## 2015-04-26 DIAGNOSIS — F17200 Nicotine dependence, unspecified, uncomplicated: Secondary | ICD-10-CM | POA: Diagnosis present

## 2015-04-26 DIAGNOSIS — R911 Solitary pulmonary nodule: Secondary | ICD-10-CM

## 2015-04-26 DIAGNOSIS — Z886 Allergy status to analgesic agent status: Secondary | ICD-10-CM | POA: Diagnosis not present

## 2015-04-26 DIAGNOSIS — Z77090 Contact with and (suspected) exposure to asbestos: Secondary | ICD-10-CM | POA: Diagnosis present

## 2015-04-26 DIAGNOSIS — C3432 Malignant neoplasm of lower lobe, left bronchus or lung: Secondary | ICD-10-CM | POA: Diagnosis present

## 2015-04-26 DIAGNOSIS — I480 Paroxysmal atrial fibrillation: Secondary | ICD-10-CM | POA: Diagnosis present

## 2015-04-26 DIAGNOSIS — Z9689 Presence of other specified functional implants: Secondary | ICD-10-CM

## 2015-04-26 DIAGNOSIS — I10 Essential (primary) hypertension: Secondary | ICD-10-CM | POA: Diagnosis present

## 2015-04-26 DIAGNOSIS — I2510 Atherosclerotic heart disease of native coronary artery without angina pectoris: Secondary | ICD-10-CM | POA: Diagnosis present

## 2015-04-26 DIAGNOSIS — Z902 Acquired absence of lung [part of]: Secondary | ICD-10-CM

## 2015-04-26 DIAGNOSIS — I471 Supraventricular tachycardia: Secondary | ICD-10-CM | POA: Diagnosis not present

## 2015-04-26 DIAGNOSIS — J9811 Atelectasis: Secondary | ICD-10-CM | POA: Diagnosis not present

## 2015-04-26 DIAGNOSIS — C3490 Malignant neoplasm of unspecified part of unspecified bronchus or lung: Secondary | ICD-10-CM

## 2015-04-26 DIAGNOSIS — J939 Pneumothorax, unspecified: Secondary | ICD-10-CM

## 2015-04-26 DIAGNOSIS — Z6833 Body mass index (BMI) 33.0-33.9, adult: Secondary | ICD-10-CM | POA: Diagnosis not present

## 2015-04-26 DIAGNOSIS — Z09 Encounter for follow-up examination after completed treatment for conditions other than malignant neoplasm: Secondary | ICD-10-CM

## 2015-04-26 HISTORY — PX: LYMPH NODE DISSECTION: SHX5087

## 2015-04-26 HISTORY — PX: VIDEO ASSISTED THORACOSCOPY (VATS)/WEDGE RESECTION: SHX6174

## 2015-04-26 HISTORY — PX: VIDEO BRONCHOSCOPY: SHX5072

## 2015-04-26 LAB — TYPE AND SCREEN
ABO/RH(D): A POS
Antibody Screen: NEGATIVE

## 2015-04-26 SURGERY — BRONCHOSCOPY, VIDEO-ASSISTED
Anesthesia: General | Site: Chest

## 2015-04-26 MED ORDER — GLYCOPYRROLATE 0.2 MG/ML IJ SOLN
INTRAMUSCULAR | Status: DC | PRN
  Administered 2015-04-26: .8 mg via INTRAVENOUS

## 2015-04-26 MED ORDER — PHENYLEPHRINE HCL 10 MG/ML IJ SOLN
INTRAMUSCULAR | Status: DC | PRN
  Administered 2015-04-26: 80 ug via INTRAVENOUS

## 2015-04-26 MED ORDER — FENTANYL 10 MCG/ML IV SOLN
INTRAVENOUS | Status: DC
  Administered 2015-04-26: 13:00:00 via INTRAVENOUS
  Filled 2015-04-26: qty 50

## 2015-04-26 MED ORDER — HYDROMORPHONE HCL 1 MG/ML IJ SOLN
0.2500 mg | INTRAMUSCULAR | Status: DC | PRN
  Administered 2015-04-26 (×4): 0.5 mg via INTRAVENOUS

## 2015-04-26 MED ORDER — ONDANSETRON HCL 4 MG/2ML IJ SOLN
INTRAMUSCULAR | Status: DC | PRN
  Administered 2015-04-26: 4 mg via INTRAVENOUS

## 2015-04-26 MED ORDER — PROPOFOL 10 MG/ML IV BOLUS
INTRAVENOUS | Status: AC
  Filled 2015-04-26: qty 20

## 2015-04-26 MED ORDER — BUPIVACAINE ON-Q PAIN PUMP (FOR ORDER SET NO CHG)
INJECTION | Status: AC
  Filled 2015-04-26: qty 1

## 2015-04-26 MED ORDER — METOCLOPRAMIDE HCL 5 MG/ML IJ SOLN
10.0000 mg | Freq: Four times a day (QID) | INTRAMUSCULAR | Status: AC
  Administered 2015-04-26 – 2015-04-27 (×3): 10 mg via INTRAVENOUS
  Filled 2015-04-26 (×3): qty 2

## 2015-04-26 MED ORDER — LEVALBUTEROL HCL 0.63 MG/3ML IN NEBU
0.6300 mg | INHALATION_SOLUTION | Freq: Four times a day (QID) | RESPIRATORY_TRACT | Status: DC
  Administered 2015-04-26 – 2015-05-02 (×24): 0.63 mg via RESPIRATORY_TRACT
  Filled 2015-04-26 (×35): qty 3

## 2015-04-26 MED ORDER — ROCURONIUM BROMIDE 100 MG/10ML IV SOLN
INTRAVENOUS | Status: DC | PRN
  Administered 2015-04-26: 50 mg via INTRAVENOUS
  Administered 2015-04-26 (×2): 20 mg via INTRAVENOUS
  Administered 2015-04-26: 10 mg via INTRAVENOUS

## 2015-04-26 MED ORDER — BUPIVACAINE-EPINEPHRINE (PF) 0.5% -1:200000 IJ SOLN
INTRAMUSCULAR | Status: DC | PRN
  Administered 2015-04-26: 10 mL

## 2015-04-26 MED ORDER — NYSTATIN 100000 UNIT/GM EX POWD
Freq: Two times a day (BID) | CUTANEOUS | Status: DC
  Administered 2015-04-26 – 2015-05-03 (×8): via TOPICAL
  Filled 2015-04-26: qty 15

## 2015-04-26 MED ORDER — HYDROMORPHONE HCL 1 MG/ML IJ SOLN
INTRAMUSCULAR | Status: AC
  Filled 2015-04-26: qty 1

## 2015-04-26 MED ORDER — TRAMADOL HCL 50 MG PO TABS
50.0000 mg | ORAL_TABLET | Freq: Four times a day (QID) | ORAL | Status: DC | PRN
  Administered 2015-04-28: 50 mg via ORAL
  Administered 2015-04-28: 100 mg via ORAL
  Administered 2015-04-29 – 2015-05-01 (×2): 50 mg via ORAL
  Administered 2015-05-02: 100 mg via ORAL
  Administered 2015-05-02: 50 mg via ORAL
  Filled 2015-04-26: qty 2
  Filled 2015-04-26: qty 1
  Filled 2015-04-26: qty 2
  Filled 2015-04-26 (×2): qty 1
  Filled 2015-04-26 (×2): qty 2

## 2015-04-26 MED ORDER — NALOXONE HCL 0.4 MG/ML IJ SOLN: 0.4000 mg | INTRAMUSCULAR | Status: DC | PRN

## 2015-04-26 MED ORDER — 0.9 % SODIUM CHLORIDE (POUR BTL) OPTIME
TOPICAL | Status: DC | PRN
  Administered 2015-04-26: 2000 mL

## 2015-04-26 MED ORDER — DIPHENHYDRAMINE HCL 50 MG/ML IJ SOLN: 12.5000 mg | Freq: Four times a day (QID) | INTRAMUSCULAR | Status: DC | PRN

## 2015-04-26 MED ORDER — 0.9 % SODIUM CHLORIDE (POUR BTL) OPTIME
TOPICAL | Status: DC | PRN
  Administered 2015-04-26: 1000 mL

## 2015-04-26 MED ORDER — MIDAZOLAM HCL 2 MG/2ML IJ SOLN
INTRAMUSCULAR | Status: AC
  Filled 2015-04-26: qty 4

## 2015-04-26 MED ORDER — ASPIRIN EC 81 MG PO TBEC
81.0000 mg | DELAYED_RELEASE_TABLET | Freq: Every day | ORAL | Status: DC
  Administered 2015-05-03: 81 mg via ORAL
  Filled 2015-04-26 (×6): qty 1

## 2015-04-26 MED ORDER — NEOSTIGMINE METHYLSULFATE 10 MG/10ML IV SOLN
INTRAVENOUS | Status: DC | PRN
  Administered 2015-04-26: 4 mg via INTRAVENOUS

## 2015-04-26 MED ORDER — FENTANYL 10 MCG/ML IV SOLN
INTRAVENOUS | Status: DC
  Administered 2015-04-26: 0 ug via INTRAVENOUS
  Administered 2015-04-27: 30 ug via INTRAVENOUS
  Administered 2015-04-27: 15 ug via INTRAVENOUS
  Administered 2015-04-27: 45 ug via INTRAVENOUS
  Administered 2015-04-27: 60 ug via INTRAVENOUS
  Administered 2015-04-27: 0 ug via INTRAVENOUS
  Administered 2015-04-27: 0 via INTRAVENOUS
  Administered 2015-04-28: 60 ug via INTRAVENOUS
  Administered 2015-04-28: 15 ug via INTRAVENOUS
  Filled 2015-04-26: qty 50

## 2015-04-26 MED ORDER — ONDANSETRON HCL 4 MG/2ML IJ SOLN: 4.0000 mg | Freq: Four times a day (QID) | INTRAMUSCULAR | Status: DC | PRN

## 2015-04-26 MED ORDER — ONDANSETRON HCL 4 MG/2ML IJ SOLN
4.0000 mg | Freq: Four times a day (QID) | INTRAMUSCULAR | Status: DC | PRN
  Administered 2015-05-01: 4 mg via INTRAVENOUS
  Filled 2015-04-26: qty 2

## 2015-04-26 MED ORDER — PROMETHAZINE HCL 25 MG/ML IJ SOLN: 6.2500 mg | INTRAMUSCULAR | Status: DC | PRN

## 2015-04-26 MED ORDER — OXYCODONE HCL 5 MG PO TABS: 5.0000 mg | ORAL_TABLET | Freq: Once | ORAL | Status: DC | PRN

## 2015-04-26 MED ORDER — ACETAMINOPHEN 500 MG PO TABS
1000.0000 mg | ORAL_TABLET | Freq: Four times a day (QID) | ORAL | Status: AC
  Administered 2015-04-26 – 2015-05-01 (×17): 1000 mg via ORAL
  Filled 2015-04-26 (×21): qty 2

## 2015-04-26 MED ORDER — BISACODYL 5 MG PO TBEC
10.0000 mg | DELAYED_RELEASE_TABLET | Freq: Every day | ORAL | Status: DC
  Administered 2015-04-27 – 2015-05-01 (×5): 10 mg via ORAL
  Filled 2015-04-26 (×5): qty 2

## 2015-04-26 MED ORDER — OXYCODONE HCL 5 MG PO TABS
5.0000 mg | ORAL_TABLET | ORAL | Status: DC | PRN
  Administered 2015-04-27 (×2): 10 mg via ORAL
  Administered 2015-04-30 (×3): 5 mg via ORAL
  Administered 2015-04-30 – 2015-05-02 (×3): 10 mg via ORAL
  Filled 2015-04-26 (×2): qty 2
  Filled 2015-04-26: qty 1
  Filled 2015-04-26 (×2): qty 2
  Filled 2015-04-26: qty 1
  Filled 2015-04-26 (×2): qty 2
  Filled 2015-04-26: qty 1

## 2015-04-26 MED ORDER — FENTANYL CITRATE (PF) 250 MCG/5ML IJ SOLN
INTRAMUSCULAR | Status: AC
  Filled 2015-04-26: qty 5

## 2015-04-26 MED ORDER — ACETAMINOPHEN 160 MG/5ML PO SOLN: 1000.0000 mg | Freq: Four times a day (QID) | ORAL | Status: AC

## 2015-04-26 MED ORDER — SENNOSIDES-DOCUSATE SODIUM 8.6-50 MG PO TABS
1.0000 | ORAL_TABLET | Freq: Every day | ORAL | Status: DC
  Administered 2015-04-26 – 2015-05-02 (×7): 1 via ORAL
  Filled 2015-04-26 (×8): qty 1

## 2015-04-26 MED ORDER — BUPIVACAINE 0.5 % ON-Q PUMP SINGLE CATH 400 ML
400.0000 mL | INJECTION | Status: DC
  Administered 2015-04-26: 400 mL
  Filled 2015-04-26: qty 400

## 2015-04-26 MED ORDER — CETYLPYRIDINIUM CHLORIDE 0.05 % MT LIQD
7.0000 mL | Freq: Two times a day (BID) | OROMUCOSAL | Status: DC
  Administered 2015-04-27 – 2015-05-03 (×10): 7 mL via OROMUCOSAL

## 2015-04-26 MED ORDER — ARTIFICIAL TEARS OP OINT
TOPICAL_OINTMENT | OPHTHALMIC | Status: DC | PRN
  Administered 2015-04-26: 1 via OPHTHALMIC

## 2015-04-26 MED ORDER — POTASSIUM CHLORIDE 10 MEQ/50ML IV SOLN: 10.0000 meq | Freq: Every day | INTRAVENOUS | Status: DC | PRN

## 2015-04-26 MED ORDER — DIPHENHYDRAMINE HCL 12.5 MG/5ML PO ELIX
12.5000 mg | ORAL_SOLUTION | Freq: Four times a day (QID) | ORAL | Status: DC | PRN
  Filled 2015-04-26: qty 5

## 2015-04-26 MED ORDER — BUPIVACAINE HCL (PF) 0.5 % IJ SOLN
INTRAMUSCULAR | Status: AC
  Filled 2015-04-26: qty 10

## 2015-04-26 MED ORDER — HYDRALAZINE HCL 25 MG PO TABS
25.0000 mg | ORAL_TABLET | Freq: Three times a day (TID) | ORAL | Status: DC
  Administered 2015-04-27 – 2015-05-03 (×19): 25 mg via ORAL
  Filled 2015-04-26 (×22): qty 1

## 2015-04-26 MED ORDER — VECURONIUM BROMIDE 10 MG IV SOLR
INTRAVENOUS | Status: DC | PRN
  Administered 2015-04-26 (×2): 2 mg via INTRAVENOUS

## 2015-04-26 MED ORDER — MIDAZOLAM HCL 5 MG/5ML IJ SOLN
INTRAMUSCULAR | Status: DC | PRN
  Administered 2015-04-26 (×2): 1 mg via INTRAVENOUS

## 2015-04-26 MED ORDER — SODIUM CHLORIDE 0.9 % IV SOLN
10.0000 mg | INTRAVENOUS | Status: DC | PRN
  Administered 2015-04-26: 20 ug/min via INTRAVENOUS
  Administered 2015-04-26: 25 ug/min via INTRAVENOUS

## 2015-04-26 MED ORDER — POTASSIUM CHLORIDE IN NACL 20-0.45 MEQ/L-% IV SOLN
INTRAVENOUS | Status: DC
  Administered 2015-04-26: 15:00:00 via INTRAVENOUS
  Administered 2015-04-26: 100 mL via INTRAVENOUS
  Filled 2015-04-26 (×3): qty 1000

## 2015-04-26 MED ORDER — FENTANYL CITRATE (PF) 100 MCG/2ML IJ SOLN
INTRAMUSCULAR | Status: DC | PRN
  Administered 2015-04-26: 100 ug via INTRAVENOUS
  Administered 2015-04-26 (×7): 50 ug via INTRAVENOUS

## 2015-04-26 MED ORDER — LACTATED RINGERS IV SOLN
INTRAVENOUS | Status: DC | PRN
  Administered 2015-04-26 (×2): via INTRAVENOUS

## 2015-04-26 MED ORDER — DEXTROSE 5 % IV SOLN
1.5000 g | Freq: Two times a day (BID) | INTRAVENOUS | Status: AC
  Administered 2015-04-26 – 2015-04-27 (×2): 1.5 g via INTRAVENOUS
  Filled 2015-04-26 (×2): qty 1.5

## 2015-04-26 MED ORDER — DEXTROSE 5 % IV SOLN
1.5000 g | INTRAVENOUS | Status: DC
  Filled 2015-04-26: qty 1.5

## 2015-04-26 MED ORDER — LOSARTAN POTASSIUM 50 MG PO TABS
50.0000 mg | ORAL_TABLET | Freq: Every day | ORAL | Status: DC
  Administered 2015-04-27 – 2015-05-03 (×7): 50 mg via ORAL
  Filled 2015-04-26 (×7): qty 1

## 2015-04-26 MED ORDER — LIDOCAINE HCL (CARDIAC) 20 MG/ML IV SOLN
INTRAVENOUS | Status: DC | PRN
  Administered 2015-04-26: 100 mg via INTRAVENOUS

## 2015-04-26 MED ORDER — OXYCODONE HCL 5 MG/5ML PO SOLN: 5.0000 mg | Freq: Once | ORAL | Status: DC | PRN

## 2015-04-26 MED ORDER — DEXTROSE 5 % IV SOLN
1.5000 g | INTRAVENOUS | Status: AC
  Administered 2015-04-26 (×2): 1.5 g via INTRAVENOUS
  Filled 2015-04-26: qty 1.5

## 2015-04-26 MED ORDER — PROPOFOL 10 MG/ML IV BOLUS
INTRAVENOUS | Status: DC | PRN
  Administered 2015-04-26: 110 mg via INTRAVENOUS

## 2015-04-26 MED ORDER — SODIUM CHLORIDE 0.9 % IJ SOLN: 9.0000 mL | INTRAMUSCULAR | Status: DC | PRN

## 2015-04-26 SURGICAL SUPPLY — 103 items
ADH SKN CLS APL DERMABOND .7 (GAUZE/BANDAGES/DRESSINGS) ×2
APL SRG 22X2 LUM MLBL SLNT (VASCULAR PRODUCTS)
APL SRG 7X2 LUM MLBL SLNT (VASCULAR PRODUCTS)
APPLICATOR TIP COSEAL (VASCULAR PRODUCTS) IMPLANT
APPLICATOR TIP EXT COSEAL (VASCULAR PRODUCTS) IMPLANT
BAG SPEC RTRVL LRG 6X4 10 (ENDOMECHANICALS) ×2
BIT DRILL 2.8 (BIT) ×2
BIT DRILL CANN QC 2.8X165 (BIT) IMPLANT
BLADE SURG 11 STRL SS (BLADE) IMPLANT
BRUSH CYTOL CELLEBRITY 1.5X140 (MISCELLANEOUS) IMPLANT
CANISTER SUCTION 2500CC (MISCELLANEOUS) ×3 IMPLANT
CATH KIT ON Q 5IN SLV (PAIN MANAGEMENT) ×1 IMPLANT
CATH THORACIC 28FR (CATHETERS) IMPLANT
CATH THORACIC 36FR (CATHETERS) IMPLANT
CATH THORACIC 36FR RT ANG (CATHETERS) IMPLANT
CLIP TI MEDIUM 6 (CLIP) ×3 IMPLANT
CONN ST 1/4X3/8  BEN (MISCELLANEOUS)
CONN ST 1/4X3/8 BEN (MISCELLANEOUS) IMPLANT
CONT SPEC 4OZ CLIKSEAL STRL BL (MISCELLANEOUS) ×8 IMPLANT
COVER TABLE BACK 60X90 (DRAPES) ×3 IMPLANT
DERMABOND ADVANCED (GAUZE/BANDAGES/DRESSINGS) ×1
DERMABOND ADVANCED .7 DNX12 (GAUZE/BANDAGES/DRESSINGS) IMPLANT
DRAIN CHANNEL 28F RND 3/8 FF (WOUND CARE) IMPLANT
DRAIN CHANNEL 32F RND 10.7 FF (WOUND CARE) IMPLANT
DRAPE LAPAROSCOPIC ABDOMINAL (DRAPES) ×3 IMPLANT
DRAPE WARM FLUID 44X44 (DRAPE) ×3 IMPLANT
DRILL BIT 2.8MM (BIT) ×3
DRILL BIT 7/64X5 (BIT) IMPLANT
ELECT BLADE 4.0 EZ CLEAN MEGAD (MISCELLANEOUS) ×3
ELECT BLADE 6.5 EXT (BLADE) ×2 IMPLANT
ELECT REM PT RETURN 9FT ADLT (ELECTROSURGICAL) ×3
ELECTRODE BLDE 4.0 EZ CLN MEGD (MISCELLANEOUS) ×2 IMPLANT
ELECTRODE REM PT RTRN 9FT ADLT (ELECTROSURGICAL) ×2 IMPLANT
FORCEPS BIOP RJ4 1.8 (CUTTING FORCEPS) IMPLANT
GAUZE SPONGE 4X4 12PLY STRL (GAUZE/BANDAGES/DRESSINGS) ×3 IMPLANT
GLOVE BIO SURGEON STRL SZ 6.5 (GLOVE) ×7 IMPLANT
GLOVE BIO SURGEON STRL SZ7 (GLOVE) ×1 IMPLANT
GLOVE BIOGEL PI IND STRL 6.5 (GLOVE) IMPLANT
GLOVE BIOGEL PI INDICATOR 6.5 (GLOVE) ×3
GLOVE ECLIPSE 6.5 STRL STRAW (GLOVE) ×2 IMPLANT
GOWN STRL REUS W/ TWL LRG LVL3 (GOWN DISPOSABLE) ×8 IMPLANT
GOWN STRL REUS W/TWL LRG LVL3 (GOWN DISPOSABLE) ×12
KIT BASIN OR (CUSTOM PROCEDURE TRAY) ×3 IMPLANT
KIT CLEAN ENDO COMPLIANCE (KITS) ×3 IMPLANT
KIT ROOM TURNOVER OR (KITS) ×3 IMPLANT
KIT SUCTION CATH 14FR (SUCTIONS) ×3 IMPLANT
MARKER SKIN DUAL TIP RULER LAB (MISCELLANEOUS) ×3 IMPLANT
NDL BIOPSY TRANSBRONCH 21G (NEEDLE) IMPLANT
NEEDLE BIOPSY TRANSBRONCH 21G (NEEDLE) IMPLANT
NS IRRIG 1000ML POUR BTL (IV SOLUTION) ×11 IMPLANT
OIL SILICONE PENTAX (PARTS (SERVICE/REPAIRS)) ×3 IMPLANT
PACK CHEST (CUSTOM PROCEDURE TRAY) ×3 IMPLANT
PAD ARMBOARD 7.5X6 YLW CONV (MISCELLANEOUS) ×9 IMPLANT
PASSER SUT SWANSON 36MM LOOP (INSTRUMENTS) IMPLANT
POUCH ENDO CATCH II 15MM (MISCELLANEOUS) ×1 IMPLANT
POUCH SPECIMEN RETRIEVAL 10MM (ENDOMECHANICALS) ×1 IMPLANT
RELOAD GREEN ECHELON 45 (STAPLE) ×1 IMPLANT
RELOAD STAPLE 35X2.5 WHT THIN (STAPLE) IMPLANT
SCISSORS LAP 5X35 DISP (ENDOMECHANICALS) IMPLANT
SEALANT PROGEL (MISCELLANEOUS) IMPLANT
SEALANT SURG COSEAL 4ML (VASCULAR PRODUCTS) IMPLANT
SEALANT SURG COSEAL 8ML (VASCULAR PRODUCTS) IMPLANT
SOLUTION ANTI FOG 6CC (MISCELLANEOUS) ×3 IMPLANT
SPONGE GAUZE 4X4 12PLY STER LF (GAUZE/BANDAGES/DRESSINGS) ×1 IMPLANT
SPONGE INTESTINAL PEANUT (DISPOSABLE) ×4 IMPLANT
STAPLE RELOAD 2.5MM WHITE (STAPLE) ×6 IMPLANT
STAPLER ECHELON POWERED (MISCELLANEOUS) ×1 IMPLANT
STAPLER VASCULAR ECHELON 35 (CUTTER) ×1 IMPLANT
SUT PROLENE 3 0 SH DA (SUTURE) IMPLANT
SUT PROLENE 4 0 RB 1 (SUTURE)
SUT PROLENE 4-0 RB1 .5 CRCL 36 (SUTURE) IMPLANT
SUT SILK  1 MH (SUTURE) ×2
SUT SILK 1 MH (SUTURE) ×8 IMPLANT
SUT SILK 1 TIES 10X30 (SUTURE) IMPLANT
SUT SILK 2 0 SH (SUTURE) IMPLANT
SUT SILK 2 0SH CR/8 30 (SUTURE) IMPLANT
SUT SILK 3 0SH CR/8 30 (SUTURE) ×1 IMPLANT
SUT STEEL 1 (SUTURE) IMPLANT
SUT VIC AB 1 CTX 18 (SUTURE) ×1 IMPLANT
SUT VIC AB 1 CTX 36 (SUTURE) ×3
SUT VIC AB 1 CTX36XBRD ANBCTR (SUTURE) IMPLANT
SUT VIC AB 2-0 CTX 36 (SUTURE) ×1 IMPLANT
SUT VIC AB 2-0 UR6 27 (SUTURE) IMPLANT
SUT VIC AB 3-0 SH 8-18 (SUTURE) ×1 IMPLANT
SUT VIC AB 3-0 X1 27 (SUTURE) ×1 IMPLANT
SUT VICRYL 0 UR6 27IN ABS (SUTURE) IMPLANT
SUT VICRYL 2 TP 1 (SUTURE) ×1 IMPLANT
SWAB COLLECTION DEVICE MRSA (MISCELLANEOUS) IMPLANT
SYR 20ML ECCENTRIC (SYRINGE) ×3 IMPLANT
SYSTEM SAHARA CHEST DRAIN ATS (WOUND CARE) ×3 IMPLANT
TAPE CLOTH SURG 4X10 WHT LF (GAUZE/BANDAGES/DRESSINGS) ×1 IMPLANT
TAPE UMBILICAL COTTON 1/8X30 (MISCELLANEOUS) ×3 IMPLANT
TIP APPLICATOR SPRAY EXTEND 16 (VASCULAR PRODUCTS) IMPLANT
TOWEL OR 17X24 6PK STRL BLUE (TOWEL DISPOSABLE) ×6 IMPLANT
TOWEL OR 17X26 10 PK STRL BLUE (TOWEL DISPOSABLE) ×6 IMPLANT
TRAP SPECIMEN MUCOUS 40CC (MISCELLANEOUS) ×3 IMPLANT
TRAY FOLEY CATH 16FRSI W/METER (SET/KITS/TRAYS/PACK) ×3 IMPLANT
TRAY FOLEY W/METER SILVER 16FR (SET/KITS/TRAYS/PACK) ×1 IMPLANT
TROCAR XCEL BLUNT TIP 100MML (ENDOMECHANICALS) IMPLANT
TUBE ANAEROBIC SPECIMEN COL (MISCELLANEOUS) IMPLANT
TUBE CONNECTING 20X1/4 (TUBING) ×5 IMPLANT
TUNNELER SHEATH ON-Q 11GX8 DSP (PAIN MANAGEMENT) IMPLANT
WATER STERILE IRR 1000ML POUR (IV SOLUTION) ×6 IMPLANT

## 2015-04-26 NOTE — Progress Notes (Signed)
Patient ID: [name], male   DOB: [birth year], 74 y.o.   MRN: [id number]  SICU Evening Rounds  Hemodynamically stable  Pain under control  Urine output ok  Chest tube output low, no air leak.

## 2015-04-26 NOTE — Brief Op Note (Signed)
04/26/2015  11:51 AM  PATIENT:  [name]  74 y.o. male  PRE-OPERATIVE DIAGNOSIS:  Left lower lobe lung lesion   POST-OPERATIVE DIAGNOSIS:  Left lower lobe lung lesion   PROCEDURE:  Procedure(s):  VIDEO BRONCHOSCOPY (N/A)  LEFT VIDEO ASSISTED THORACOSCOPY/MINI THORACOTOMY -Left Lower Lobectomy -Lymph Node Sampling  SURGEON:  Surgeon(s) and Role:    * Grace Isaac, MD - Primary  PHYSICIAN ASSISTANT: Janasia Coverdale PA-C  ANESTHESIA:   general  EBL:  Total I/O In: 1000 [I.V.:1000] Out: 285 [Urine:185; Blood:100]  BLOOD ADMINISTERED:none  DRAINS: 28 Blake, Straight Chest tube   LOCAL MEDICATIONS USED:  BUPIVICAINE   SPECIMEN:  Source of Specimen:  Left Lower Lobe, Lymph Nodes  DISPOSITION OF SPECIMEN:  PATHOLOGY  COUNTS:  YES  TOURNIQUET:  * No tourniquets in log *  DICTATION: .Dragon Dictation  PLAN OF CARE: Admit to inpatient   PATIENT DISPOSITION:  ICU - intubated and hemodynamically stable.   Delay start of Pharmacological VTE agent (>24hrs) due to surgical blood loss or risk of bleeding: yes

## 2015-04-26 NOTE — Transfer of Care (Signed)
Immediate Anesthesia Transfer of Care Note  Patient: [name]  Procedure(s) Performed: Procedure(s): VIDEO BRONCHOSCOPY (N/A) LEFT VIDEO ASSISTED THORACOSCOPY (VATS) WITH LEFT LOWER LOBECTOMY (Left) LYMPH NODE DISSECTION (Left)  Patient Location: PACU  Anesthesia Type:General  Level of Consciousness: awake, alert , oriented and sedated  Airway & Oxygen Therapy: Patient Spontanous Breathing and Patient connected to face mask oxygen  Post-op Assessment: Report given to RN, Post -op Vital signs reviewed and stable and Patient moving all extremities X 4  Post vital signs: Reviewed and stable  Last Vitals:  Filed Vitals:   04/26/15 0552  BP: 154/57  Pulse: 80  Temp: 36.7 C  Resp: 18    Complications: No apparent anesthesia complications

## 2015-04-26 NOTE — Anesthesia Procedure Notes (Addendum)
Anesthesia Procedure Note Central line insertion note. Skin prepped and draped in sterile fashion. Patent vessel identified on u/s using linear probe. Needle advanced under live u/s guidance with aspiration of blood upon entry into vessel. Catheter passed easily over finder needle. Wire passed easily through catheter and location confirmed with u/s. Image saved in chart.Double lumen central venous catheter advanced over wire, with aspiration of blood through all ports for confirmation. Line sutured and dressing applied. Pt tolerated well with no immediate complications.   Procedure Name: Intubation Date/Time: 04/26/2015 7:42 AM Performed by: Gaylene Brooks Pre-anesthesia Checklist: Patient identified, Timeout performed and Emergency Drugs available Patient Re-evaluated:Patient Re-evaluated prior to inductionOxygen Delivery Method: Circle system utilized Preoxygenation: Pre-oxygenation with 100% oxygen Intubation Type: IV induction Ventilation: Mask ventilation without difficulty Laryngoscope Size: Miller and 2 Grade View: Grade II Tube type: Oral Tube size: 8.5 mm Number of attempts: 1 Airway Equipment and Method: Stylet Placement Confirmation: ETT inserted through vocal cords under direct vision,  positive ETCO2 and breath sounds checked- equal and bilateral Secured at: 24 cm Tube secured with: Tape Dental Injury: Teeth and Oropharynx as per pre-operative assessment

## 2015-04-26 NOTE — H&P (Signed)
[address]       [place] [address]             [phone]                    Rhyland O Mozingo [place] Medical Record #[id number] Date of Birth: 05-12-1941  Referring: Dr Ashok Cordia Primary Care: [name], NP  Chief Complaint:    Left lung cancer.   History of Present Illness:    [name] 74 y.o. 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 The patient has been a long-term smoker he is also had work exposure to asbestos. He was hospitalized approximately year ago with significant infection sepsis and discitis, ultimately he was discharged to a nursing facility but now has returned home and is fully caring for himself. He notes he played cough yesterday and today. In January of this year he had a low dose CT scan of the chest without any lung nodules. In July while working around his house he developed discomfort in his right groin and thought he potentially had a hernia he was seen in the emergency room area of groin infection was drained a CT scan of the abdomen revealed a left lower lobe lung nodule this is been further evaluated with a PET scan and a CT directed needle biopsy. 10 months ago the lesion was not present on his x-ray. The patient the patient had a needle biopsy of the left lower lobe lesion done on 03/10/2015. Pulmonary function studies were performed. The patient remains relatively active. He has had episodes of paroxysmal atrial fibrillation in the past and is followed by Dr. Luther Parody cardiology.      Current Activity/ Functional Status:  Patient is independent with mobility/ambulation, transfers, ADL's, IADL's.    Zubrod Score: At the time of surgery this patient's most appropriate activity status/level should be described as: '[]'$     0    Normal activity, no symptoms '[x]'$     1    Restricted in physical strenuous activity but ambulatory, able to do out light work '[]'$     2    Ambulatory and capable of self  care, unable to do work activities, up and about               >50 % of waking hours                              '[]'$     3    Only limited self care, in bed greater than 50% of waking hours '[]'$     4    Completely disabled, no self care, confined to bed or chair '[]'$     5    Moribund   Past Medical History  Diagnosis Date  . Acute appendicitis with peritoneal abscess 01/28/2013  . Ileus, postoperative 01/28/2013  . Tobacco use disorder 01/28/2013  . Obesity, morbid ([place]) 01/28/2013  . Shortness of breath   . Essential hypertension 04/16/2014  . Elevated PSA   . Bacteremia September 2015  . A-fib ([place])   . Lung cancer ([place])   . Arthritis     Past Surgical History  Procedure Laterality Date  . Back surgery  1997, 2003    Dr.Apleton   . Laparoscopic appendectomy N/A 01/25/2013    Procedure: APPENDECTOMY LAPAROSCOPIC;  Surgeon: Ralene Ok, MD;  Location: [facility];  Service:  General;  Laterality: N/A;  . Tee without cardioversion N/A 04/07/2014    Procedure: TRANSESOPHAGEAL ECHOCARDIOGRAM (TEE);  Surgeon: Candee Furbish, MD;  Location: [facility];  Service: Cardiovascular;  Laterality: N/A;  . Shoulder surgery Right 2014    Dr.Whitfield rotator cuff surgery  . Knee surgery Bilateral   . Appendectomy    . Carpal tunnel release    . Colonoscopy      x 3  . Eye surgery Left     muscle of the eye     Family History  Problem Relation Age of Onset  . Irritable bowel syndrome Mother   . Cancer Neg Hx   . Lung disease Neg Hx     Social History   Social History  . Marital Status: Divorced    Spouse Name: N/A  . Number of Children: 0  . Years of Education: N/A   Occupational History  . Retired    Social History Main Topics  . Smoking status: Current Every Day Smoker -- 0.00 packs/day for 50 years    Types: Cigars, Cigarettes    Start date: 07/16/1958  . Smokeless tobacco: Never Used     Comment: Peak use 2ppd of cigarettes- as of 04-20-15 has cut down to 5 cigarettes/day  .  Alcohol Use: No  . Drug Use: No  . Sexual Activity: Not Currently   Other Topics Concern  . Not on file   Social History Narrative   No diet   Yes, eats/drinks things with caffeine    Divorced, married 1979   Lives in a house, one stories , one person, no pets   Current/past profession- [employer]   Patient exercises, golf 3 days weekly        Originally from [place]. Previously lived in [facility] for [place] years and moved back to [place] in 1968. No international travel. He has traveled through multiple states traveling to [place]. 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 Unsure if he was exposed to any asbestos. No mold exposure. No pets currently. Previously has owned dogs. Parakeet as a child. No hot tub exposure. Enjoys golfing.    History  Smoking status  . Current Every Day Smoker -- 0.00 packs/day for 50 years  . Types: Cigars, Cigarettes  . Start date: 07/16/1958  Smokeless tobacco  . Never Used    Comment: Peak use 2ppd of cigarettes- as of 04-20-15 has cut down to 5 cigarettes/day    History  Alcohol Use No     Allergies  Allergen Reactions  . Asa [Aspirin] Swelling    Eyes and lip swelling    Current Facility-Administered Medications  Medication Dose Route Frequency Provider Last Rate Last Dose  . cefUROXime (ZINACEF) 1.5 g in dextrose 5 % 50 mL IVPB  1.5 g Intravenous 60 min Pre-Op Grace Isaac, MD          Review of Systems:     Cardiac Review of Systems: Y or N  Chest Pain [ n   ]  Resting SOB [  n ] Exertional SOB  [ y ]  Orthopnea [ n ]   Pedal Edema [ n  ]    Palpitations [n  ] Syncope  [ n ]   Presyncope [  n ]  General Review of Systems: [Y] = yes [  ]=no Constitional: recent weight change [ n ];  Wt loss over the last 3 months [   ]  anorexia [  ]; fatigue [  ]; nausea [  ]; night sweats [  ]; fever [ n ]; or chills [ n ];          Dental: poor dentition[ n ]; Last Dentist visit:   Eye :  blurred vision [  ]; diplopia [   ]; vision changes [  ];  Amaurosis fugax[  ]; Resp: cough [ y ];  wheezing[ n];  hemoptysis[n ]; shortness of breath[  n]; paroxysmal nocturnal dyspnea[  n]; dyspnea on exertion[  ]; or orthopnea[  ];  GI:  gallstones[  ], vomiting[  ];  dysphagia[  ]; melena[  ];  hematochezia [  ]; heartburn[  ];   Hx of  Colonoscopy[ n ]; GU: kidney stones [  ]; hematuria[  ];   dysuria [  ];  nocturia[  ];  history of     obstruction [  ]; urinary frequency Blue.Reese  ]             Skin: rash, swelling[  ];, hair loss[  ];  peripheral edema[  ];  or itching[  ]; Musculosketetal: myalgias[  ];  joint swelling[  ];  joint erythema[  ];  joint pain[  ];  back pain[  ];  Heme/Lymph: bruising[  ];  bleeding[  ];  anemia[  ];  Neuro: TIA[ n ];  headaches[  ];  stroke[ n ];  vertigo[  ];  seizures[ n ];   paresthesias[  ];  difficulty walking[  ];  Psych:depression[  ]; anxiety[  ];  Endocrine: diabetes[ n ];  thyroid dysfunction[ n ];  Immunizations: Flu up to date [n  ]; Pneumococcal up to date [ n ];  Other:  Physical Exam: BP 154/57 mmHg  Pulse 80  Temp(Src) 98.1 F (36.7 C) (Oral)  Resp 18  Wt 210 lb (95.255 kg)  SpO2 97%  PHYSICAL EXAMINATION: General appearance: alert, cooperative and no distress Head: Normocephalic, without obvious abnormality, atraumatic Neck: no adenopathy, no carotid bruit, no JVD, supple, symmetrical, trachea midline and thyroid not enlarged, symmetric, no tenderness/mass/nodules Lymph nodes: Cervical, supraclavicular, and axillary nodes normal. Resp: clear to auscultation bilaterally Back: symmetric, no curvature. ROM normal. No CVA tenderness. Cardio: regular rate and rhythm, S1, S2 normal, no murmur, click, rub or gallop GI: soft, non-tender; bowel sounds normal; no masses,  no organomegaly Extremities: extremities normal, atraumatic, no cyanosis or edema and Homans sign is negative, no sign of DVT Neurologic: Grossly normal  Diagnostic  Studies & Laboratory data:     Recent Radiology Findings:    CLINICAL DATA: 74 year old male current smoker with 50 pack-year history of smoking. Lung cancer screening examination.  EXAM: CT CHEST WITHOUT CONTRAST  TECHNIQUE: Multidetector CT imaging of the chest was performed following the standard protocol without IV contrast.  COMPARISON: Chest CT 08/23/2005.  FINDINGS: Mediastinum/Lymph Nodes: Heart size is normal. There is no significant pericardial fluid, thickening or pericardial calcification. There is atherosclerosis of the thoracic aorta, the great vessels of the mediastinum and the coronary arteries, including calcified atherosclerotic plaque in the left anterior descending, left circumflex and right coronary arteries. Calcifications of the mitral annulus. No pathologically enlarged mediastinal or hilar lymph nodes. Please note that accurate exclusion of hilar adenopathy is limited on noncontrast CT scans. Esophagus is unremarkable in appearance. No axillary lymphadenopathy.  Lungs/Pleura: There are a few scattered tiny pulmonary nodules in the lungs bilaterally, largest of which measures only 5 x 4 mm (mean diameter  4.5 mm) in the periphery of the right upper lobe (image 75 of series 3). No other larger more suspicious appearing pulmonary nodules or masses are otherwise noted. Mild diffuse bronchial wall thickening. Mild centrilobular and paraseptal emphysema. No acute consolidative airspace disease. No pleural effusions. Minimal bibasilar subsegmental atelectasis.  Musculoskeletal/Soft Tissues: There are no aggressive appearing lytic or blastic lesions noted in the visualized portions of the skeleton.  Upper Abdomen: 2.2 cm low-attenuation lesion in segment 2 of the liver is incompletely characterized on today's noncontrast CT examination, but is unchanged in size compared to remote prior study 08/23/2005, and is considered benign, presumably a  cyst.  IMPRESSION: 1. Lung-RADS Category 2, benign appearance or behavior. Continue annual screening with low-dose chest CT without contrast in 12 months. 2. Mild diffuse bronchial wall thickening with mild centrilobular and paraseptal emphysema; imaging findings suggestive of underlying COPD. 3. Atherosclerosis, including 3 vessel coronary artery disease. Assessment for potential risk factor modification, dietary therapy or pharmacologic therapy may be warranted, if clinically indicated. 4. Additional incidental findings, as above.   Electronically Signed  By: Vinnie Langton M.D.  On: 08/05/2014 11:52  CLINICAL DATA: Initial treatment strategy for pulmonary nodules.  EXAM: NUCLEAR MEDICINE PET SKULL BASE TO THIGH  TECHNIQUE: 7.09 mCi F-18 FDG was injected intravenously. Full-ring PET imaging was performed from the skull base to thigh after the radiotracer. CT data was obtained and used for attenuation correction and anatomic localization.  FASTING BLOOD GLUCOSE: Value: 104 mg/dl  COMPARISON: Screening chest CT 08/05/2014. Abdominal pelvic CTs 04/01/2014 and 02/24/2005.  FINDINGS: NECK  No hypermetabolic cervical lymph nodes are identified.There are no lesions of the pharyngeal mucosal space.  CHEST  There are no hypermetabolic mediastinal, hilar or axillary lymph nodes. The dominant left lower lobe nodule demonstrated on recent abdominal CT is hypermetabolic. This nodule measures 2.0 x 1.8 cm on image 46 and has an SUV max of 13.2. No other hypermetabolic pulmonary activity demonstrated. Additional scattered tiny nodules are unchanged from prior chest CT, including a 4 mm right upper lobe nodule on image number 20 and tiny nodules in the right middle lobe on image 38 and in the lingula on image 49.  ABDOMEN/PELVIS  There is no hypermetabolic activity within the liver, adrenal glands, spleen or pancreas. There is no hypermetabolic  nodal activity. There is hypermetabolic activity in the right groin corresponding with presumed inflammatory changes on recent CT. This has an SUV max of 9.6. Hepatic steatosis, a probable cyst in the left hepatic lobe, diffuse atherosclerosis and a left scrotal hydrocele noted.  SKELETON  There is no hypermetabolic activity to suggest osseous metastatic disease. Several sebaceous cysts are noted within the back, larger on the right. There is no associated abnormal metabolic activity.  IMPRESSION: 1. The dominant left lower lobe nodule is hypermetabolic with malignant range metabolic activity worrisome for malignancy. Of note, there was no abnormality in this area on the screening chest CT performed only 7 months ago. Therefore, infection is a possible explanation for this finding. Correlate clinically. 2. The other tiny nodules seen on prior screening chest CT are unchanged. 3. No other worrisome activity. No evidence of metastatic disease. 4. Presumed inflammatory/postsurgical changes in the right groin.   Electronically Signed  By: Richardean Sale M.D.  On: 03/07/2015 08:56 I have independently reviewed the above radiologic studies.  Recent Lab Findings: Lab Results  Component Value Date   WBC 8.8 04/20/2015   HGB 16.8 04/20/2015   HCT 49.8 04/20/2015   PLT 188  04/20/2015   GLUCOSE 85 04/20/2015   CHOL 159 01/28/2015   TRIG 160* 01/28/2015   HDL 41 01/28/2015   LDLCALC 86 01/28/2015   ALT 24 04/20/2015   AST 26 04/20/2015   NA 138 04/20/2015   K 4.3 04/20/2015   CL 110 04/20/2015   CREATININE 0.73 04/20/2015   BUN 13 04/20/2015   CO2 22 04/20/2015   TSH 0.976 04/01/2014   INR 1.00 04/20/2015   HGBA1C 5.3 07/27/2014   PFT' FEV1: 2.34  86%  DLCO 24.38 90% Diagnosis Lung, needle/core biopsy(ies), left lower lobe - SARCOMATOID CARCINOMA, SEE COMMENT. Microscopic Comment The case was submitted to [facility] [place] Department of Pathology for  outside opinion (see outside pathology report, [id number]). (CRR:ecj 04/01/2015) Mali RUND DO Pathologist, Electronic Signature (Case signed 04/01/2015)  Assessment / Plan:   New left lower lobe lung lesion biopsy proven sarcomatoid carcinoma Atherosclerosis, including 3 vessel coronary artery disease.  Long term smoking history-I've spent 7-10 minutes discussing with the patient the reasons to stop smoking preoperatively and given him printed information about smoking in the perioperative period   I've reviewed with the patient his CT scan and findings and diagnosis and recommended that the highest chance of cure would be by surgical resection preferably left lower lobectomy the risks and options of this were discussed with patient in detail. Since he he is followed by Dr. Luther Parody and on his CT scan does have evidence of three-vessel coronary artery disease he had preoperative clearance with Dr Luther Parody. Plan to proceed with bronchoscopy left the assisted thoracoscopy left lower lobectomy and node dissection today The goals risks and alternatives of the planned surgical procedure Bronchoscopy, left VATS lung resection and node dissection  Has  been discussed with the patient in detail. The risks of the procedure including death, infection, stroke, myocardial infarction, bleeding, blood transfusion have all been discussed specifically.  I have quoted [name] a 2 % of perioperative mortality and a complication rate as high as 40 %. The patient's questions have been answered.[name] is willing  to proceed with the planned procedure.    Grace Isaac MD      [address] [place] [address] Office [phone]   Beeper [phone]  04/26/2015 7:24 AM

## 2015-04-26 NOTE — Anesthesia Preprocedure Evaluation (Addendum)
Anesthesia Evaluation  Patient identified by MRN, date of birth, ID band Patient awake    Reviewed: Allergy & Precautions, NPO status , Patient's Chart, lab work & pertinent test results  Airway Mallampati: II  TM Distance: >3 FB Neck ROM: Full    Dental  (+) Dental Advisory Given, Edentulous Upper, Poor Dentition   Pulmonary Current Smoker,    breath sounds clear to auscultation       Cardiovascular hypertension, Pt. on medications  Rhythm:Regular Rate:Normal     Neuro/Psych negative neurological ROS     GI/Hepatic negative GI ROS, Neg liver ROS,   Endo/Other    Renal/GU negative Renal ROS     Musculoskeletal  (+) Arthritis ,   Abdominal   Peds  Hematology negative hematology ROS (+)   Anesthesia Other Findings   Reproductive/Obstetrics                            Anesthesia Physical Anesthesia Plan  ASA: III  Anesthesia Plan: General   Post-op Pain Management:    Induction: Intravenous  Airway Management Planned: Double Lumen EBT  Additional Equipment: Arterial line, CVP and Ultrasound Guidance Line Placement  Intra-op Plan:   Post-operative Plan: Extubation in OR  Informed Consent: I have reviewed the patients History and Physical, chart, labs and discussed the procedure including the risks, benefits and alternatives for the proposed anesthesia with the patient or authorized representative who has indicated his/her understanding and acceptance.   Dental advisory given  Plan Discussed with: CRNA  Anesthesia Plan Comments:         Anesthesia Quick Evaluation

## 2015-04-26 NOTE — Anesthesia Postprocedure Evaluation (Signed)
  Anesthesia Post-op Note  Patient: [name] Reason  Procedure(s) Performed: Procedure(s): VIDEO BRONCHOSCOPY (N/A) LEFT VIDEO ASSISTED THORACOSCOPY (VATS) WITH LEFT LOWER LOBECTOMY (Left) LYMPH NODE DISSECTION (Left)  Patient Location: PACU  Anesthesia Type:General  Level of Consciousness: awake, alert , oriented and patient cooperative  Airway and Oxygen Therapy: Patient Spontanous Breathing and Patient connected to nasal cannula oxygen  Post-op Pain: mild  Post-op Assessment: Post-op Vital signs reviewed, Patient's Cardiovascular Status Stable, Respiratory Function Stable, Patent Airway, No signs of Nausea or vomiting and Pain level controlled              Post-op Vital Signs: Reviewed and stable  Last Vitals:  Filed Vitals:   04/26/15 1329  BP: 131/53  Pulse: 87  Temp: 36.4 C  Resp: 15    Complications: No apparent anesthesia complications

## 2015-04-27 ENCOUNTER — Inpatient Hospital Stay (HOSPITAL_COMMUNITY): Payer: Medicare HMO

## 2015-04-27 ENCOUNTER — Encounter (HOSPITAL_COMMUNITY): Payer: Self-pay | Admitting: Cardiothoracic Surgery

## 2015-04-27 LAB — BASIC METABOLIC PANEL
ANION GAP: 11 (ref 5–15)
Anion gap: 7 (ref 5–15)
BUN: 12 mg/dL (ref 6–20)
BUN: 15 mg/dL (ref 6–20)
CALCIUM: 8.5 mg/dL — AB (ref 8.9–10.3)
CHLORIDE: 102 mmol/L (ref 101–111)
CO2: 26 mmol/L (ref 22–32)
CO2: 26 mmol/L (ref 22–32)
Calcium: 8.5 mg/dL — ABNORMAL LOW (ref 8.9–10.3)
Chloride: 104 mmol/L (ref 101–111)
Creatinine, Ser: 0.85 mg/dL (ref 0.61–1.24)
Creatinine, Ser: 0.96 mg/dL (ref 0.61–1.24)
GFR calc Af Amer: >60 mL/min (ref >60)
GFR calc non Af Amer: >60 mL/min (ref >60)
GFR calc non Af Amer: >60 mL/min (ref >60)
Glucose, Bld: 104 mg/dL — ABNORMAL HIGH (ref 65–99)
Glucose, Bld: 109 mg/dL — ABNORMAL HIGH (ref 65–99)
Potassium: 4.2 mmol/L (ref 3.5–5.1)
Potassium: 4.3 mmol/L (ref 3.5–5.1)
SODIUM: 139 mmol/L (ref 135–145)
Sodium: 137 mmol/L (ref 135–145)

## 2015-04-27 LAB — POCT I-STAT 3, ART BLOOD GAS (G3+)
Acid-Base Excess: 2 mmol/L (ref 0.0–2.0)
Bicarbonate: 28 mEq/L — ABNORMAL HIGH (ref 20.0–24.0)
O2 Saturation: 96 %
Patient temperature: 98.6
TCO2: 29 mmol/L (ref 0–100)
pCO2 arterial: 48.4 mmHg — ABNORMAL HIGH (ref 35.0–45.0)
pH, Arterial: 7.37 (ref 7.350–7.450)
pO2, Arterial: 82 mmHg (ref 80.0–100.0)

## 2015-04-27 LAB — CBC
HCT: 44.2 % (ref 39.0–52.0)
HEMATOCRIT: 44.7 % (ref 39.0–52.0)
HEMOGLOBIN: 14.4 g/dL (ref 13.0–17.0)
Hemoglobin: 14.6 g/dL (ref 13.0–17.0)
MCH: 31.9 pg (ref 26.0–34.0)
MCH: 32.7 pg (ref 26.0–34.0)
MCHC: 32.2 g/dL (ref 30.0–36.0)
MCHC: 33 g/dL (ref 30.0–36.0)
MCV: 98.9 fL (ref 78.0–100.0)
MCV: 99.1 fL (ref 78.0–100.0)
Platelets: 175 10*3/uL (ref 150–400)
Platelets: 169 10*3/uL (ref 150–400)
RBC: 4.52 MIL/uL (ref 4.22–5.81)
RBC: 4.46 MIL/uL (ref 4.22–5.81)
RDW: 14.4 % (ref 11.5–15.5)
RDW: 14.2 % (ref 11.5–15.5)
WBC: 10 10*3/uL (ref 4.0–10.5)
WBC: 12.4 10*3/uL — ABNORMAL HIGH (ref 4.0–10.5)

## 2015-04-27 LAB — MAGNESIUM: Magnesium: 2.5 mg/dL — ABNORMAL HIGH (ref 1.7–2.4)

## 2015-04-27 LAB — TSH: TSH: 0.194 u[IU]/mL — ABNORMAL LOW (ref 0.350–4.500)

## 2015-04-27 MED ORDER — AMIODARONE HCL IN DEXTROSE 360-4.14 MG/200ML-% IV SOLN
INTRAVENOUS | Status: AC
  Administered 2015-04-27: 200 mL
  Filled 2015-04-27: qty 200

## 2015-04-27 MED ORDER — AMIODARONE LOAD VIA INFUSION
150.0000 mg | Freq: Once | INTRAVENOUS | Status: AC
  Administered 2015-04-27: 150 mg via INTRAVENOUS
  Filled 2015-04-27: qty 83.34

## 2015-04-27 MED ORDER — AMIODARONE HCL IN DEXTROSE 360-4.14 MG/200ML-% IV SOLN
30.0000 mg/h | INTRAVENOUS | Status: DC
  Administered 2015-04-28: 30 mg/h via INTRAVENOUS
  Filled 2015-04-27 (×3): qty 200

## 2015-04-27 MED ORDER — AMIODARONE HCL IN DEXTROSE 360-4.14 MG/200ML-% IV SOLN
60.0000 mg/h | INTRAVENOUS | Status: DC
  Administered 2015-04-27 (×2): 60 mg/h via INTRAVENOUS
  Filled 2015-04-27: qty 200

## 2015-04-27 NOTE — Progress Notes (Addendum)
MD paged at this time; pt with 11 beat run SVT  rate 150; BP 116/50; pt asymptomatic; pt sitting up in chair; will await callback.  Ruben Reason

## 2015-04-27 NOTE — Progress Notes (Signed)
MD returned page; new orders for labs and Amio gtt with bolus; will cont. To monitor.  [name]

## 2015-04-27 NOTE — Care Management Note (Signed)
Case Management Note  Patient Details  Name: [name] MRN: [id number] Date of Birth: [birth year]  Subjective/Objective:      Pt admitted s/p VATS with lobectomy             Action/Plan: PTA pt lived at home alone- pt to tx to SDU today- NCM to follow pt progression for d/c needs  Expected Discharge Date:                  Expected Discharge Plan:  [place]  In-House Referral:     Discharge planning Services  CM Consult  Post Acute Care Choice:    Choice offered to:     DME Arranged:    DME Agency:     HH Arranged:    [place] Agency:     Status of Service:  In process, will continue to follow  Medicare Important Message Given:    Date Medicare IM Given:    Medicare IM give by:    Date Additional Medicare IM Given:    Additional Medicare Important Message give by:     If discussed at [place] of Stay Meetings, dates discussed:    Additional Comments:  Dawayne Patricia, RN 04/27/2015, 10:57 AM

## 2015-04-27 NOTE — Progress Notes (Addendum)
[facility] DAILY ICU PROGRESS NOTE                   [address]            [place] [address]          [phone]   1 Day Post-Op Procedure(s) (LRB): VIDEO BRONCHOSCOPY (N/A) LEFT VIDEO ASSISTED THORACOSCOPY (VATS) WITH LEFT LOWER LOBECTOMY (Left) LYMPH NODE DISSECTION (Left)  Total Length of Stay:  LOS: 1 day   Subjective:  Patient states he is doing okay.  His pain is controlled.  Has no ambulated yet.  Tolerated liquid diet last night  Objective: Vital signs in last 24 hours: Temp:  [97 F (36.1 C)-98.6 F (37 C)] 98.1 F (36.7 C) (10/12 0719) Pulse Rate:  [77-100] 77 (10/12 0700) Cardiac Rhythm:  [-] Normal sinus rhythm (10/12 0600) Resp:  [8-26] 13 (10/12 0700) BP: (108-165)/(42-110) 128/46 mmHg (10/12 0700) SpO2:  [93 %-100 %] 96 % (10/12 0700) Arterial Line BP: (126-201)/(38-105) 154/47 mmHg (10/12 0700) Weight:  [209 lb 7 oz (95 kg)-213 lb 13.5 oz (97 kg)] 213 lb 13.5 oz (97 kg) (10/12 0600)  Filed Weights   04/26/15 0552 04/26/15 1600 04/27/15 0600  Weight: 210 lb (95.255 kg) 209 lb 7 oz (95 kg) 213 lb 13.5 oz (97 kg)    Weight change: -9 oz (-0.255 kg)   Intake/Output from previous day: 10/11 0701 - 10/12 0700 In: 3527.5 [P.O.:520; I.V.:2907.5; IV Piggyback:100] Out: 2921 [Urine:2150; Blood:101; Chest Tube:670]  Current Meds: Scheduled Meds: . acetaminophen  1,000 mg Oral 4 times per day   Or  . acetaminophen (TYLENOL) oral liquid 160 mg/5 mL  1,000 mg Oral 4 times per day  . antiseptic oral rinse  7 mL Mouth Rinse BID  . aspirin EC  81 mg Oral Daily  . bisacodyl  10 mg Oral Daily  . fentaNYL   Intravenous 6 times per day  . hydrALAZINE  25 mg Oral TID  . levalbuterol  0.63 mg Nebulization Q6H  . losartan  50 mg Oral Daily  . nystatin   Topical BID  . senna-docusate  1 tablet Oral QHS   Continuous Infusions: . 0.45 % NaCl with KCl 20 mEq / L 100 mL (04/26/15 2337)  . bupivacaine ON-Q pain pump     PRN Meds:.diphenhydrAMINE **OR**  diphenhydrAMINE, naloxone **AND** sodium chloride, ondansetron (ZOFRAN) IV, ondansetron (ZOFRAN) IV, oxyCODONE, potassium chloride, traMADol  General appearance: alert, cooperative and no distress Heart: regular rate and rhythm Lungs: clear to auscultation bilaterally Abdomen: soft, non-tender; bowel sounds normal; no masses,  no organomegaly Wound: clean and dry  Lab Results: CBC: Recent Labs  04/27/15 0457  WBC 10.0  HGB 14.4  HCT 44.7  PLT 175   BMET:  Recent Labs  04/27/15 0457  NA 139  K 4.2  CL 102  CO2 26  GLUCOSE 104*  BUN 12  CREATININE 0.85  CALCIUM 8.5*    PT/INR: No results for input(s): LABPROT, INR in the last 72 hours. Radiology: Dg Chest Port 1 View  04/27/2015  CLINICAL DATA:  Chest tube placement status post video-assisted thoracic surgery. EXAM: PORTABLE CHEST 1 VIEW COMPARISON:  April 26, 2015. FINDINGS: Stable cardiomediastinal silhouette. Two left-sided chest tubes are noted without evidence of pneumothorax. Stable left basilar scarring or atelectasis is noted. Elevated left hemidiaphragm is noted. No significant pleural effusion is noted. New mild right basilar opacity is noted most consistent with subsegmental atelectasis. IMPRESSION: Stable position of  2 left-sided chest tubes without evidence of pneumothorax. New mild right basilar subsegmental atelectasis. Electronically Signed   By: Marijo Conception, M.D.   On: 04/27/2015 07:38   Dg Chest Port 1 View  04/26/2015  CLINICAL DATA:  Left lower lobectomy EXAM: PORTABLE CHEST 1 VIEW COMPARISON:  04/26/2015 FINDINGS: Postop changes on the left. Left lateral displaced rib fractures. Two chest tubes on the left. No pneumothorax. Left lower lobe atelectasis. Left jugular central venous catheter tip in the left innominate vein. Right upper lobe density compatible with right upper lobe collapse. This has developed in the interval. IMPRESSION: Postop left lower lobectomy. Left lower lobe atelectasis. No  pneumothorax Right upper lobe airspace disease compatible with volume loss and atelectasis. Electronically Signed   By: Franchot Gallo M.D.   On: 04/26/2015 13:10     Assessment/Plan: S/P Procedure(s) (LRB): VIDEO BRONCHOSCOPY (N/A) LEFT VIDEO ASSISTED THORACOSCOPY (VATS) WITH LEFT LOWER LOBECTOMY (Left) LYMPH NODE DISSECTION (Left)  1. Chest tube- questionable tidaling vs 1+ air leak in chest tube- will leave on suction today, CXR with atelectasis, no pneumothorax 2. Pulm- wean oxygen as tolerated, continue Xopenex, add flutter valve 3. Decrease IV Fluids 4. D/C Arterial LIne 5. CV- H/O HTN- restarted home medications will monitor 6. Dispo- patient looks good, stable... Transfer to 3S    Ellwood Handler 04/27/2015 7:42 AM  To 3 s , no air leak this am I have seen and examined [name] and agree with the above assessment  and plan.  Grace Isaac MD Beeper [phone] Office [phone] 04/27/2015 9:03 AM

## 2015-04-27 NOTE — Progress Notes (Signed)
Amiodarone Drug - Drug Interaction Consult Note  Recommendations: Pt does not currently have any drug-drug interactions with amiodarone. Will continue to monitor and update as needed. Amiodarone is metabolized by the cytochrome P450 system and therefore has the potential to cause many drug interactions. Amiodarone has an average plasma half-life of 50 days (range 20 to 100 days).   There is potential for drug interactions to occur several weeks or months after stopping treatment and the onset of drug interactions may be slow after initiating amiodarone.   '[]'$  Statins: Increased risk of myopathy. Simvastatin- restrict dose to '20mg'$  daily. Other statins: counsel patients to report any muscle pain or weakness immediately.  '[]'$  Anticoagulants: Amiodarone can increase anticoagulant effect. Consider warfarin dose reduction. Patients should be monitored closely and the dose of anticoagulant altered accordingly, remembering that amiodarone levels take several weeks to stabilize.  '[]'$  Antiepileptics: Amiodarone can increase plasma concentration of phenytoin, the dose should be reduced. Note that small changes in phenytoin dose can result in large changes in levels. Monitor patient and counsel on signs of toxicity.  '[]'$  Beta blockers: increased risk of bradycardia, AV block and myocardial depression. Sotalol - avoid concomitant use.  '[]'$   Calcium channel blockers (diltiazem and verapamil): increased risk of bradycardia, AV block and myocardial depression.  '[]'$   Cyclosporine: Amiodarone increases levels of cyclosporine. Reduced dose of cyclosporine is recommended.  '[]'$  Digoxin dose should be halved when amiodarone is started.  '[]'$  Diuretics: increased risk of cardiotoxicity if hypokalemia occurs.  '[]'$  Oral hypoglycemic agents (glyburide, glipizide, glimepiride): increased risk of hypoglycemia. Patient's glucose levels should be monitored closely when initiating amiodarone therapy.   '[]'$  Drugs that prolong the  QT interval:  Torsades de pointes risk may be increased with concurrent use - avoid if possible.  Monitor QTc, also keep magnesium/potassium WNL if concurrent therapy can't be avoided. Marland Kitchen Antibiotics: e.g. fluoroquinolones, erythromycin. . Antiarrhythmics: e.g. quinidine, procainamide, disopyramide, sotalol. . Antipsychotics: e.g. phenothiazines, haloperidol.  . Lithium, tricyclic antidepressants, and methadone. Thank You,  Andrey Cota. Diona Foley, PharmD Clinical Pharmacist Pager [phone]  04/27/2015 6:03 PM

## 2015-04-27 NOTE — Op Note (Signed)
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  PROCEDURE:  Left lower lobectomy and lymph node dissection, video bronchoscopy, left video-assisted thoracoscopy, and placement of On-Q.  SURGEON:  Lanelle Bal, MD  FIRST ASSISTANT:  Ellwood Handler, PA.  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  A screening CT scan for carcinoma of the lung was done in January of 2016 with the scans done to evaluate his diskitis, a new left lower lobe lung nodule was noted that had not been present previously.  PET scan was performed. Initially, this was thought possibly to be an inflammatory infectious process.  However, needle biopsy confirmed sarcomatoid carcinoma of the lung, and the patient was referred for consideration of resection.  He did have known coronary calcifications on CT.  He had been followed by Dr. Marlou Porch in the past.  A Myoview stress test was performed and was at low risk.  The patient's pulmonary function, although he was a long-term smoker, was adequate for resection.  Risks and options were discussed with the patient in detail and was agreeable and signed informed consent.  DESCRIPTION OF PROCEDURE:  The patient underwent general endotracheal anesthesia without incident.  Single-lumen endotracheal tube was initially placed.  Through this tube, a fiberoptic bronchoscope was passed to the subsegmental level in both the left and right tracheobronchial tree without evidence of  endobronchial lesions.  The scope was removed.  The patient was placed in lateral decubitus position with the left side up.  Left chest was prepped with Betadine and draped in usual sterile manner.  The left side had been marked preoperatively. A second time-out was performed.  We then made a small incision approximately sixth intercostal space midaxillary line.  Through this, port site was placed and a 30-degree videoscope was introduced into the chest.  It showed no evidence of pleural spread, inferiorly well- developed fissures.  Additional port site was created, and a small utilitarian incision was made slightly more anterior through this incision and 2 port sites were then proceeded with left lower lobectomy. The inferior pulmonary ligament was divided up to the inferior pulmonary vein which was encircled.  We then dissected along the major fissure. There was a large lymph node on the bronchus and adherent to the pulmonary artery along the fissure.  This was dissected free and submitted separately to Pathology.  The pulmonary artery branch to the lower lobe was identified and dissected free.  The superior segment pulmonary artery was also identified.  With this free, we then proceeded with a vascular stapler dissecting under the pulmonary artery and crossing the vessel was stapled and divided.  We then divided the inferior pulmonary vein in  a similar fashion.  Dissection was carried to the lower lobe bronchus where a green load stapler was used to cross the lower lobe bronchus.  The bronchus was clamped with the stapler.  The lung inflation was tested, the upper lobe inflated well and was divided. The remainder of the fissure was dissected free.  A specimen bag was placed into the chest and the specimen was placed in the bag and were removed through this incision.  We then dissected 3 additional 10 L, 11 L, and 12 L lymph nodes and submitted separately to Pathology.  The bronchial  stump was tested and was without air leak.  An On-Q device was tunneled subpleurally.  A 28 Blake drain was placed posteriorly through the port site and a 28 Argyle chest tube placed anteriorly.  Incision was then closed with 2 pericostal sutures along the incision with the lower rib drilled, interrupted 0 Vicryl, running 2-0 Vicryl in subcutaneous tissue, and a 3-0 subcuticular stitch in skin edges.  Dry dressings were applied.  Prior to closure, the left upper lobe inflated nicely.  At the completion the procedure, there was very minimal air leak.  The patient was awakened and extubated in the operating room and transferred to the recovery room for further postoperative care.  He tolerated the procedure without obvious complication.  The sponge and needle count was reported as correct at the completion of the procedure.     Lanelle Bal, MD     EG/MEDQ  D:  04/27/2015  T:  04/27/2015  Job:  [id number]

## 2015-04-27 NOTE — Progress Notes (Signed)
[facility] BRIEF SICU PROGRESS NOTE  1 Day Post-Op  S/P Procedure(s) (LRB): VIDEO BRONCHOSCOPY (N/A) LEFT VIDEO ASSISTED THORACOSCOPY (VATS) WITH LEFT LOWER LOBECTOMY (Left) LYMPH NODE DISSECTION (Left)   Stable day NSR w/ stable BP O2 sats 93% on RA  Plan: Continue routine care.  Awaiting bed for transfer to step down  Rexene Alberts, MD 04/27/2015 9:45 PM

## 2015-04-27 NOTE — Progress Notes (Signed)
Foley left in at this time; pt prefers to have it removed after lunch; pt sitting up in the chair; no complaints; will cont. To monitor.  [name]

## 2015-04-27 NOTE — Progress Notes (Signed)
Utilization review completed.  

## 2015-04-27 NOTE — Progress Notes (Signed)
Pt refusing to take 81 mg po ASA at this time; pt states he has taken ASA in the past and he experienced facial swelling and swelling of his lips; pt stated his cardiologist has stated he can resume ASA at any time if the patient chooses to, but at this time the pt has been hesitant to take ASA because of his previous reaction; pt prefers to speak with MD and his primary cardiologist before resuming aspirin at this time; will cont. To monitor.  Ruben Reason

## 2015-04-28 ENCOUNTER — Inpatient Hospital Stay (HOSPITAL_COMMUNITY): Payer: Medicare HMO

## 2015-04-28 LAB — COMPREHENSIVE METABOLIC PANEL
ALBUMIN: 3.1 g/dL — AB (ref 3.5–5.0)
ALT: 23 U/L (ref 17–63)
ANION GAP: 10 (ref 5–15)
AST: 26 U/L (ref 15–41)
Alkaline Phosphatase: 55 U/L (ref 38–126)
BUN: 13 mg/dL (ref 6–20)
CHLORIDE: 101 mmol/L (ref 101–111)
CO2: 27 mmol/L (ref 22–32)
Calcium: 8.7 mg/dL — ABNORMAL LOW (ref 8.9–10.3)
Creatinine, Ser: 0.9 mg/dL (ref 0.61–1.24)
GFR calc Af Amer: >60 mL/min (ref >60)
GFR calc non Af Amer: >60 mL/min (ref >60)
GLUCOSE: 123 mg/dL — AB (ref 65–99)
POTASSIUM: 4.1 mmol/L (ref 3.5–5.1)
Sodium: 138 mmol/L (ref 135–145)
TOTAL PROTEIN: 6.4 g/dL — AB (ref 6.5–8.1)
Total Bilirubin: 0.5 mg/dL (ref 0.3–1.2)

## 2015-04-28 LAB — CULTURE, RESPIRATORY W GRAM STAIN

## 2015-04-28 LAB — CBC
HCT: 44.6 % (ref 39.0–52.0)
Hemoglobin: 14.6 g/dL (ref 13.0–17.0)
MCH: 32.3 pg (ref 26.0–34.0)
MCHC: 32.7 g/dL (ref 30.0–36.0)
MCV: 98.7 fL (ref 78.0–100.0)
PLATELETS: 169 10*3/uL (ref 150–400)
RBC: 4.52 MIL/uL (ref 4.22–5.81)
RDW: 14.1 % (ref 11.5–15.5)
WBC: 11.6 10*3/uL — AB (ref 4.0–10.5)

## 2015-04-28 MED ORDER — SODIUM CHLORIDE 0.9 % IJ SOLN
10.0000 mL | Freq: Two times a day (BID) | INTRAMUSCULAR | Status: DC
  Administered 2015-04-28: 20 mL
  Administered 2015-04-28 – 2015-05-02 (×4): 10 mL

## 2015-04-28 MED ORDER — SODIUM CHLORIDE 0.9 % IJ SOLN: 10.0000 mL | INTRAMUSCULAR | Status: DC | PRN

## 2015-04-28 MED ORDER — AMIODARONE HCL 200 MG PO TABS
200.0000 mg | ORAL_TABLET | Freq: Two times a day (BID) | ORAL | Status: DC
Start: 2015-04-28 — End: 2015-04-30
  Administered 2015-04-28 – 2015-04-29 (×4): 200 mg via ORAL
  Filled 2015-04-28 (×5): qty 1

## 2015-04-28 NOTE — Evaluation (Signed)
Physical Therapy Evaluation Patient Details Name: [name] MRN: [id number] DOB: 1940-11-15 Today's Date: 04/28/2015   History of Present Illness  Pt is a 74 y/o male admitted with sarcomatous lung CA, s/p VATS for L LL lobectomy PMHx paroxysmal Afib  Clinical Impression  Pt admitted with/for Lung Ca, s/p VATS for lobectomy.  Pt currently limited functionally due to the problems listed. ( See problems list.)   Pt will benefit from PT to maximize function and safety in order to get ready for next venue listed below.     Follow Up Recommendations SNF;Other (comment) (MD wishs 24/7 assist which pt does not have.)    Equipment Recommendations  None recommended by PT    Recommendations for Other Services       Precautions / Restrictions Precautions Precautions: None Restrictions Weight Bearing Restrictions: No      Mobility  Bed Mobility               General bed mobility comments: Not test in chair already  Transfers Overall transfer level: Needs assistance   Transfers: Sit to/from Stand Sit to Stand: Supervision            Ambulation/Gait Ambulation/Gait assistance: Supervision Ambulation Distance (Feet): 600 Feet Assistive device:  (pushing w/c) Gait Pattern/deviations: Step-through pattern Gait velocity: moderate Gait velocity interpretation: at or above normal speed for age/gender General Gait Details: steady, moderate speed.  Able to walk without w/c, but needed for equipment.  Stairs            Wheelchair Mobility    Modified Rankin (Stroke Patients Only)       Balance Overall balance assessment: Needs assistance Sitting-balance support: No upper extremity supported Sitting balance-Leahy Scale: Good     Standing balance support: No upper extremity supported Standing balance-Leahy Scale: Good                               Pertinent Vitals/Pain Pain Assessment: Faces Faces Pain Scale: Hurts even more Pain  Location: VATS incision Pain Descriptors / Indicators: Sore Pain Intervention(s): Monitored during session    Home Living Family/patient expects to be discharged to:: Skilled nursing facility Living Arrangements: Alone               Additional Comments: Has friends to call on, Independent, drove, played Golf before coming in for surgery.    Prior Function Level of Independence: Independent               Hand Dominance        Extremity/Trunk Assessment   Upper Extremity Assessment: Overall WFL for tasks assessed           Lower Extremity Assessment: Overall WFL for tasks assessed         Communication      Cognition Arousal/Alertness: Awake/alert Behavior During Therapy: WFL for tasks assessed/performed Overall Cognitive Status: Within Functional Limits for tasks assessed                      General Comments General comments (skin integrity, edema, etc.): VSS throughout; Sats dropped to 89% briefly, but came up with focused, efficient breathing    Exercises        Assessment/Plan    PT Assessment Patient needs continued PT services  PT Diagnosis Acute pain (decr activity tolerance)   PT Problem List Decreased activity tolerance;Decreased mobility;Cardiopulmonary status limiting activity;Pain  PT Treatment Interventions Gait training;Stair  training;Functional mobility training;Therapeutic activities;Patient/family education   PT Goals (Current goals can be found in the Care Plan section) Acute Rehab PT Goals Patient Stated Goal: Home, Independent, back to normal activity PT Goal Formulation: With patient Time For Goal Achievement: 05/12/15 Potential to Achieve Goals: Good    Frequency Min 3X/week   Barriers to discharge        Co-evaluation               End of Session   Activity Tolerance: Patient tolerated treatment well Patient left: in chair;with call bell/phone within reach Nurse Communication: Mobility status          Time: 1001-1030 PT Time Calculation (min) (ACUTE ONLY): 29 min   Charges:   PT Evaluation $Initial PT Evaluation Tier I: 1 Procedure PT Treatments $Gait Training: 8-22 mins   PT G Codes:        [name], [name] 04/28/2015, 10:46 AM 04/28/2015  [name], PT [phone]  (pager)

## 2015-04-28 NOTE — Progress Notes (Signed)
Wasted 10cc Fentanyl PCA from syringe with Benjaman Pott, RN in needle box.

## 2015-04-28 NOTE — Progress Notes (Signed)
Patient examined and record reviewed.Hemodynamics stable,labs satisfactory.Patient had stable day. 1 episode of SVT today but very transient-continue amiodarone, consider adding low-dose beta blocker Continue current care. Tharon Aquas Trigt III 04/28/2015

## 2015-04-28 NOTE — Progress Notes (Addendum)
[facility] DAILY ICU PROGRESS NOTE                   [address]            [place] [address]          [phone]   2 Days Post-Op Procedure(s) (LRB): VIDEO BRONCHOSCOPY (N/A) LEFT VIDEO ASSISTED THORACOSCOPY (VATS) WITH LEFT LOWER LOBECTOMY (Left) LYMPH NODE DISSECTION (Left)  Total Length of Stay:  LOS: 2 days   Subjective: Comfortable, no complaints. Walked in hall twice yesterday. Maintaining SR. Awaiting stepdown bed.   Objective: Vital signs in last 24 hours: Temp:  [98 F (36.7 C)-99.4 F (37.4 C)] 98.7 F (37.1 C) (10/13 0800) Pulse Rate:  [76-105] 79 (10/13 0900) Cardiac Rhythm:  [-] Normal sinus rhythm (10/13 0800) Resp:  [14-25] 19 (10/13 0900) BP: (107-151)/(44-95) 147/74 mmHg (10/13 0900) SpO2:  [90 %-97 %] 93 % (10/13 0900)  Filed Weights   04/26/15 0552 04/26/15 1600 04/27/15 0600  Weight: 210 lb (95.255 kg) 209 lb 7 oz (95 kg) 213 lb 13.5 oz (97 kg)    Weight change:    Hemodynamic parameters for last 24 hours:    Intake/Output from previous day: 10/12 0701 - 10/13 0700 In: 2247 [P.O.:1500; I.V.:747] Out: 9147 [Urine:3100; Chest Tube:340]  Intake/Output this shift: Total I/O In: 53.4 [I.V.:53.4] Out: 150 [Urine:150]  Current Meds: Scheduled Meds: . acetaminophen  1,000 mg Oral 4 times per day   Or  . acetaminophen (TYLENOL) oral liquid 160 mg/5 mL  1,000 mg Oral 4 times per day  . amiodarone  200 mg Oral BID  . antiseptic oral rinse  7 mL Mouth Rinse BID  . aspirin EC  81 mg Oral Daily  . bisacodyl  10 mg Oral Daily  . hydrALAZINE  25 mg Oral TID  . levalbuterol  0.63 mg Nebulization Q6H  . losartan  50 mg Oral Daily  . nystatin   Topical BID  . senna-docusate  1 tablet Oral QHS   Continuous Infusions: . bupivacaine ON-Q pain pump     PRN Meds:.ondansetron (ZOFRAN) IV, oxyCODONE, potassium chloride, traMADol  Physical Exam: General appearance: alert, cooperative and no distress Heart: regular rate and  rhythm Lungs: Few coarse BS in bases Wound: Clean and dry Chest tube: No air leak  Lab Results: CBC:  Recent Labs  04/27/15 1810 04/28/15 0351  WBC 12.4* 11.6*  HGB 14.6 14.6  HCT 44.2 44.6  PLT 169 169   BMET:   Recent Labs  04/27/15 1810 04/28/15 0351  NA 137 138  K 4.3 4.1  CL 104 101  CO2 26 27  GLUCOSE 109* 123*  BUN 15 13  CREATININE 0.96 0.90  CALCIUM 8.5* 8.7*    PT/INR: No results for input(s): LABPROT, INR in the last 72 hours. Radiology: Dg Chest Port 1 View  04/28/2015  CLINICAL DATA:  Status post left thoracotomy and lower lobectomy with lymph node dissection EXAM: PORTABLE CHEST 1 VIEW COMPARISON:  Portable chest x-ray of April 27, 2015 and preoperative PA and lateral chest x-ray of April 26, 2015. FINDINGS: The right lung is adequately inflated. Density at the right lung base has improved. On the left there is persistent volume loss. There is persistent subsegmental atelectasis at the left lung base. There is no pneumothorax or significant pleural effusion. The 2 left-sided chest tubes are unchanged in position. The cardiac silhouette is mildly enlarged. The pulmonary vascularity is not engorged. The left internal jugular  venous catheter tip projects at the junction of the right and left brachiocephalic veins. IMPRESSION: 1. Stable appearance of the left hemithorax. There is no pneumothorax or significant pleural effusion. Minimal left basilar atelectasis remains. 2. Interval mild improvement in atelectasis at the right lung base. 3. Stable support tubes. Electronically Signed   By: David  Martinique M.D.   On: 04/28/2015 07:40     Assessment/Plan: S/P Procedure(s) (LRB): VIDEO BRONCHOSCOPY (N/A) LEFT VIDEO ASSISTED THORACOSCOPY (VATS) WITH LEFT LOWER LOBECTOMY (Left) LYMPH NODE DISSECTION (Left)  CT with no air leak and CXR stable. Will place CTs to water seal.  AF, now in SR. Will switch Amiodarone from IV to po and watch.  HTN- BPs stable, continue  home meds.  Not using PCA, so will d/c and saline lock IVF.  Awaiting tx to 3S.  Grace Isaac 04/28/2015 10:02 AM No air leak Lung cancer, lower lobe ([facility])   Staging form: Lung, AJCC 6th Edition     Pathologic stage from 04/25/2015: Stage IA (T1, N0, M0) - Signed by Grace Isaac, MD on 04/28/2015

## 2015-04-29 ENCOUNTER — Inpatient Hospital Stay (HOSPITAL_COMMUNITY): Payer: Medicare HMO

## 2015-04-29 MED ORDER — FUROSEMIDE 10 MG/ML IJ SOLN
40.0000 mg | Freq: Once | INTRAMUSCULAR | Status: AC
  Administered 2015-04-29: 40 mg via INTRAVENOUS
  Filled 2015-04-29: qty 4

## 2015-04-29 MED ORDER — LACTULOSE 10 GM/15ML PO SOLN
20.0000 g | Freq: Every day | ORAL | Status: DC | PRN
  Administered 2015-05-01: 20 g via ORAL
  Filled 2015-04-29 (×2): qty 30

## 2015-04-29 NOTE — Discharge Summary (Addendum)
Physician Discharge Summary  Patient ID: [name] MRN: [id number] DOB/AGE: 72-18-1942 72 y.o.  Admit date: 04/26/2015 Discharge date: 05/03/2015  Admission Diagnoses:  Patient Active Problem List   Diagnosis Date Noted  . Lung cancer, lower lobe ([place])   . Multiple lung nodules on CT 03/02/2015  . Obesity 11/29/2014  . Chronic anticoagulation 08/12/2014  . Atrial fibrillation ([place]) 07/29/2014  . Osteomyelitis ([place]) 04/16/2014  . Dyspnea 04/16/2014  . Physical deconditioning 04/16/2014  . Essential hypertension 04/16/2014  . Leg swelling 04/16/2014  . Loose stools 04/16/2014  . Dehydration 04/01/2014  . Leukocytosis 04/01/2014  . Atrial fibrillation with rapid ventricular response ([place]) 04/01/2014  . Abdominal pain, generalized 04/01/2014  . Nausea vomiting and diarrhea 04/01/2014  . Back pain 04/01/2014  . Transaminitis 04/01/2014  . Sepsis ([place]) 04/01/2014  . Thrombocytopenia, unspecified ([place]) 04/01/2014  . Acute appendicitis with peritoneal abscess 01/28/2013  . Ileus, postoperative 01/28/2013  . Tobacco use disorder 01/28/2013  . Obesity, morbid ([place]) 01/28/2013  . IMPACTED CERUMEN 01/29/2008  . EAR PAIN, LEFT 01/29/2008  . URTICARIA 11/10/2007  . GENITAL HERPES, 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   Discharge Diagnoses:   Patient Active Problem List   Diagnosis Date Noted  . 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 Lung cancer, lower lobe ([place])   . Multiple lung nodules on CT 03/02/2015  . Obesity 11/29/2014  . Chronic anticoagulation 08/12/2014  . Atrial fibrillation ([place]) 07/29/2014  . Osteomyelitis ([place]) 04/16/2014  . Dyspnea 04/16/2014  . Physical deconditioning 04/16/2014  . Essential hypertension 04/16/2014  . Leg swelling 04/16/2014  . Loose stools 04/16/2014  . Dehydration 04/01/2014  . Leukocytosis 04/01/2014  . Atrial fibrillation with rapid ventricular response ([place]) 04/01/2014  . Abdominal pain, generalized 04/01/2014  . Nausea vomiting and diarrhea  04/01/2014  . Back pain 04/01/2014  . Transaminitis 04/01/2014  . Sepsis ([place]) 04/01/2014  . Thrombocytopenia, unspecified ([place]) 04/01/2014  . Acute appendicitis with peritoneal abscess 01/28/2013  . Ileus, postoperative 01/28/2013  . Tobacco use disorder 01/28/2013  . Obesity, morbid ([place]) 01/28/2013  . IMPACTED CERUMEN 01/29/2008  . EAR PAIN, LEFT 01/29/2008  . URTICARIA 11/10/2007  . 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  He was hospitalized about 1 year ago for sepsis and discitis.  He required discharge to a SNF but was eventually discharged home and lives independently.  The patient had a low grade CT scan this past January which did not show evidence of pulmonary nodule.  However in July the patient developed discomfort in his right groin.  He thought this was a hernia and presented to the ED.  During that visit a CT of the abdomen was obtained and showed a left lower lobe lung nodule.  Further workup with PET CT scan and needle biopsy have also been completed.  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  Due to this he was referred to [facility] for surgical evaluation.  He was evaluated by Dr. Servando Snare on 04/14/2015 at which time the patient was offered VATS with lung resection.  The risks and benefits of the procedure were explained to the patient and he was agreeable to proceed.  Hospital Course:   Mr. [name] presented to [facility] on 04/26/2015.  He was taken to the operating room and underwent Left VATS  with Mini Thoracotomy with left lower lobectomy and lymph node sampling.  He tolerated the procedure without difficulty, was extubated and taken to the SICU in stable condition.  During his stay in the SICU the patient did well.  His chest tube remained on suction for 24 hours.   He was weaned to water seal as tolerated.  His CXR has remained stable without evidence of pneumothorax.  His anterior chest tube was removed on POD # 3.  He had some brief Atrial Fibrillation for which he was treated with Amiodarone.  He was felt to be medically stable and transferred to the step down unit for further care.  He converted and remains in sinus rhythm on oral Amiodarone and Lopressor. His remaining chest tube was removed 05/03/2015. Follow up chest x ray remained stable. He is felt surgically stable for discharge today.  Significant Diagnostic Studies: nuclear medicine:   1. The dominant left lower lobe nodule is hypermetabolic with malignant range metabolic activity worrisome for malignancy. Of note, there was no abnormality in this area on the screening chest CT performed only 7 months ago. Therefore, infection is a possible explanation for this finding. Correlate clinically. 2. The other tiny nodules seen on prior screening chest CT are unchanged. 3. No other worrisome activity. No evidence of metastatic disease. 4. Presumed inflammatory/postsurgical changes in the right groin. CLINICAL DATA: Pneumothorax  EXAM: CHEST 2 VIEW  COMPARISON: 05/02/2015  FINDINGS: Lingular scarring. Suspected small right pleural effusion. No pneumothorax is seen.  The heart is normal in size.  Degenerative changes of the visualized thoracolumbar spine.  IMPRESSION: No pneumothorax is seen.  Lingular scarring. Suspected small right pleural effusion.   Electronically Signed  By: Julian Hy M.D.  On: 05/03/2015 08:27  Treatments: surgery:   Left lower lobectomy and lymph node dissection, video bronchoscopy, left video-assisted thoracoscopy, and placement of On-Q on 04/26/2015.  Disposition: 01-Home or Self Care   Discharge Medications:     Medication List    TAKE these medications        amiodarone 200 MG tablet  Commonly known as:  PACERONE  Take 1 tablet  (200 mg total) by mouth 2 (two) times daily. For one week;then take Amiodarone 200 mg daily thereafter     aspirin EC 81 MG tablet  Take 1 tablet (81 mg total) by mouth daily.     hydrALAZINE 25 MG tablet  Commonly known as:  APRESOLINE  Take one tablet by mouth three times daily for blood pressure     losartan 50 MG tablet  Commonly known as:  COZAAR  TAKE 1 TABLET BY MOUTH EVERY DAY FOR BLOOD PRESSURE     metoprolol tartrate 25 MG tablet  Commonly known as:  LOPRESSOR  Take 0.5 tablets (12.5 mg total) by mouth 2 (two) times daily.     nystatin 100000 UNIT/GM Powd  Apply to groin 1-2 times daily     OVER THE COUNTER MEDICATION  Take 1 capsule by mouth 2 (two) times daily. Prosvent Ultra Blend Natural Prostate Health Supplement     traMADol 50 MG tablet  Commonly known as:  ULTRAM  Take 1-2 tablets (50-100 mg total) by mouth every 6 (six) hours as needed (mild pain).       Follow-up Information    Follow up with Grace Isaac, MD On 05/18/2015.   Specialty:  Cardiothoracic Surgery   Why:  Appoitnment is at 3:45   Contact information:   [address]  [place] [address] [phone]       Follow up with [facility] On 05/18/2015.   Why:  please get CXR at 3:15   Contact information:   [facility]       Follow up with Candee Furbish, MD.   Specialty:  Cardiology   Why:  Call for a follow up 1-2 weeks regarding post op a fib   Contact information:   [address] [place] [address] [phone]       Signed: Nani Skillern PA-C 05/03/2015, 9:07 AM

## 2015-04-29 NOTE — Care Management Important Message (Signed)
Important Message  Patient Details  Name: [name] MRN: [id number] Date of Birth: [birth year]   Medicare Important Message Given:  Yes-second notification given    Nathen May 04/29/2015, 10:15 AM

## 2015-04-29 NOTE — Progress Notes (Addendum)
[facility] DAILY ICU PROGRESS NOTE                   [address]            [place] [address]          [phone]   3 Days Post-Op Procedure(s) (LRB): VIDEO BRONCHOSCOPY (N/A) LEFT VIDEO ASSISTED THORACOSCOPY (VATS) WITH LEFT LOWER LOBECTOMY (Left) LYMPH NODE DISSECTION (Left)  Total Length of Stay:  LOS: 3 days   Subjective:  [name] has no complaints this morning.  He is moving without difficulty.  No BM, passing flatus  Objective: Vital signs in last 24 hours: Temp:  [98.2 F (36.8 C)-99.4 F (37.4 C)] 98.2 F (36.8 C) (10/13 2338) Pulse Rate:  [73-104] 86 (10/14 0700) Cardiac Rhythm:  [-] Normal sinus rhythm (10/14 0700) Resp:  [14-27] 22 (10/14 0700) BP: (135-163)/(50-74) 139/62 mmHg (10/14 0400) SpO2:  [90 %-97 %] 92 % (10/14 0700) Weight:  [210 lb 1.6 oz (95.301 kg)] 210 lb 1.6 oz (95.301 kg) (10/14 0600)  Filed Weights   04/26/15 1600 04/27/15 0600 04/29/15 0600  Weight: 209 lb 7 oz (95 kg) 213 lb 13.5 oz (97 kg) 210 lb 1.6 oz (95.301 kg)    Weight change:    Intake/Output from previous day: 10/13 0701 - 10/14 0700 In: 913.4 [P.O.:840; I.V.:73.4] Out: 1910 [NATFT:7322; Chest Tube:290]  Current Meds: Scheduled Meds: . acetaminophen  1,000 mg Oral 4 times per day   Or  . acetaminophen (TYLENOL) oral liquid 160 mg/5 mL  1,000 mg Oral 4 times per day  . amiodarone  200 mg Oral BID  . antiseptic oral rinse  7 mL Mouth Rinse BID  . aspirin EC  81 mg Oral Daily  . bisacodyl  10 mg Oral Daily  . hydrALAZINE  25 mg Oral TID  . levalbuterol  0.63 mg Nebulization Q6H  . losartan  50 mg Oral Daily  . nystatin   Topical BID  . senna-docusate  1 tablet Oral QHS  . sodium chloride  10-40 mL Intracatheter Q12H   Continuous Infusions: . bupivacaine ON-Q pain pump     PRN Meds:.ondansetron (ZOFRAN) IV, oxyCODONE, potassium chloride, sodium chloride, traMADol  General appearance: alert, cooperative and no distress Heart: regular rate and  rhythm Lungs: clear to auscultation bilaterally Abdomen: soft, non-tender; bowel sounds normal; no masses,  no organomegaly Wound: clean and dry  Lab Results: CBC: Recent Labs  04/27/15 1810 04/28/15 0351  WBC 12.4* 11.6*  HGB 14.6 14.6  HCT 44.2 44.6  PLT 169 169   BMET:  Recent Labs  04/27/15 1810 04/28/15 0351  NA 137 138  K 4.3 4.1  CL 104 101  CO2 26 27  GLUCOSE 109* 123*  BUN 15 13  CREATININE 0.96 0.90  CALCIUM 8.5* 8.7*    PT/INR: No results for input(s): LABPROT, INR in the last 72 hours. Radiology: No results found.   Assessment/Plan: S/P Procedure(s) (LRB): VIDEO BRONCHOSCOPY (N/A) LEFT VIDEO ASSISTED THORACOSCOPY (VATS) WITH LEFT LOWER LOBECTOMY (Left) LYMPH NODE DISSECTION (Left)  1. Chest tube- no definitive air leak, + tidaling, 290 cc output- can likely remove once chest tube 2. Pulm- not on oxygen, + atelectasis on CXR will order flutter valve, encouraged patient to use flutter valve/IS 3. CV- previous A. Fib, maintaining NSR, HTN stable- continue Amiodarone, Hydralazine, Cozaar 4. Dispo- patient stable, awaiting transfer to 3S, will change order to 2W    Ellwood Handler 04/29/2015 7:38 AM  Feels well no air leak, one  Chest tube out today, poss one tomorrow and home Sunday or Monday, lives alone but wants to go home with his church friends and neighbors helping him.  Discussed stage with him Lung cancer, lower lobe [facility])   Staging form: Lung, AJCC 6th Edition     Pathologic stage from 04/25/2015: Stage IA (T1, N0, M0) - Signed by Grace Isaac, MD on 04/28/2015 I have seen and examined [name] and agree with the above assessment  and plan.  Grace Isaac MD Beeper [phone] Office [phone] 04/29/2015 8:35 AM

## 2015-04-29 NOTE — Discharge Instructions (Signed)
VIDEO ASSISTED, Care After Refer to this sheet in the next few weeks. These instructions provide you with information on caring for yourself after your procedure. Your caregiver may also give you more specific instructions. Your procedure has been planned according to current medical practices, but problems sometimes occur. Call your caregiver if you have any problems or questions after your procedure. HOME CARE INSTRUCTIONS   Only take over-the-counter or prescription medications as directed.  Only take pain medications (narcotics) as directed.  Do not drive until your caregiver approves. Driving while taking narcotics or soon after surgery can be dangerous, so discuss the specific timing with your caregiver.  Avoid activities that use your chest muscles, such as lifting heavy objects, for at least 3-4 weeks.   Take deep breaths to expand the lungs and to protect against pneumonia.  Do breathing exercises as directed by your caregiver. If you were given an incentive spirometer to help with breathing, use it as directed.  You may resume a normal diet and activities when you feel you are able to or as directed.  Do not take a bath until your caregiver says it is OK. Use the shower instead.   Keep the bandage (dressing) covering the area where the chest tube was inserted (incision site) dry for 48 hours. After 48 hours, remove the dressing unless there is new drainage.  Remove dressings as directed by your caregiver.  Change dressings if necessary or as directed.  Keep all follow-up appointments. It is important for you to see your caregiver after surgery to discuss appropriate follow-up care and surveillance, if it is necessary. SEEK MEDICAL CARE:  You feel excessive or increasing pain at an incision site.  You notice bleeding, skin irritation, drainage, swelling, or redness at an incision site.  There is a bad smell coming from an incision or dressing.  It feels like your heart  is fluttering or beating rapidly.  Your pain medication does not relieve your pain. SEEK IMMEDIATE MEDICAL CARE IF:   You have a fever.   You have chest pain.  You have a rash.  You have shortness of breath.  You have trouble breathing.   You feel weak, lightheaded, dizzy, or faint.  MAKE SURE YOU:   Understand these instructions.   Will watch your condition.   Will get help right away if you are not doing well or get worse.   This information is not intended to replace advice given to you by your health care provider. Make sure you discuss any questions you have with your health care provider.   Document Released: 10/27/2012 Document Revised: 07/23/2014 Document Reviewed: 10/27/2012 Elsevier Interactive Patient Education [employer].

## 2015-04-30 ENCOUNTER — Inpatient Hospital Stay (HOSPITAL_COMMUNITY): Payer: Medicare HMO

## 2015-04-30 MED ORDER — METOPROLOL TARTRATE 12.5 MG HALF TABLET
12.5000 mg | ORAL_TABLET | Freq: Two times a day (BID) | ORAL | Status: DC
  Administered 2015-04-30 – 2015-05-03 (×7): 12.5 mg via ORAL
  Filled 2015-04-30 (×7): qty 1

## 2015-04-30 MED ORDER — AMIODARONE HCL 200 MG PO TABS
400.0000 mg | ORAL_TABLET | Freq: Two times a day (BID) | ORAL | Status: DC
  Administered 2015-04-30 – 2015-05-01 (×4): 400 mg via ORAL
  Filled 2015-04-30 (×4): qty 2

## 2015-04-30 NOTE — Progress Notes (Addendum)
       [address]       [place] [address]             [phone]          4 Days Post-Op Procedure(s) (LRB): VIDEO BRONCHOSCOPY (N/A) LEFT VIDEO ASSISTED THORACOSCOPY (VATS) WITH LEFT LOWER LOBECTOMY (Left) LYMPH NODE DISSECTION (Left)  Subjective: More sore today, mainly from coughing after breathing tx.    Objective: Vital signs in last 24 hours: Patient Vitals for the past 24 hrs:  BP Temp Temp src Pulse Resp SpO2 Weight  04/30/15 0821 - - - - - 95 % -  04/30/15 0453 (!) 142/57 mmHg 98.2 F (36.8 C) Oral 84 18 96 % -  04/30/15 0255 - - - - - - 211 lb (95.709 kg)  04/30/15 0159 - - - - - 95 % -  04/29/15 2041 (!) 129/52 mmHg 98.5 F (36.9 C) Oral 85 16 95 % -  04/29/15 1941 - - - - - 94 % -  04/29/15 1300 - - - 96 18 93 % -  04/29/15 1122 (!) 136/52 mmHg 99.2 F (37.3 C) Oral (!) 102 18 95 % -  04/29/15 1027 (!) 123/47 mmHg - - - - - -  04/29/15 1000 - - - - (!) 25 - -  04/29/15 0900 - 98.6 F (37 C) Oral 93 (!) 23 93 % -   Current Weight  04/30/15 211 lb (95.709 kg)     Intake/Output from previous day: 10/14 0701 - 10/15 0700 In: 260 [P.O.:240; I.V.:20] Out: 2385 [Urine:2125; Chest Tube:260]    PHYSICAL EXAM:  Heart: RRR, freq PACs Lungs: Coarse rhonchi bilaterally Wound: Clean and dry Chest tube: No air leak    Lab Results: CBC: Recent Labs  04/27/15 1810 04/28/15 0351  WBC 12.4* 11.6*  HGB 14.6 14.6  HCT 44.2 44.6  PLT 169 169   BMET:  Recent Labs  04/27/15 1810 04/28/15 0351  NA 137 138  K 4.3 4.1  CL 104 101  CO2 26 27  GLUCOSE 109* 123*  BUN 15 13  CREATININE 0.96 0.90  CALCIUM 8.5* 8.7*    PT/INR: No results for input(s): LABPROT, INR in the last 72 hours.  CXR: FINDINGS: Left apical chest tube. No pneumothorax is seen.  Bibasilar opacities, likely atelectasis. No focal consolidation. Mild elevation left hemidiaphragm.  The heart is top-normal in size.  IMPRESSION: Left apical chest tube.  No pneumothorax is seen.  Mild bibasilar opacities, likely atelectasis.  Assessment/Plan: S/P Procedure(s) (LRB): VIDEO BRONCHOSCOPY (N/A) LEFT VIDEO ASSISTED THORACOSCOPY (VATS) WITH LEFT LOWER LOBECTOMY (Left) LYMPH NODE DISSECTION (Left)  CT with no air leak, CXR stable. CT output remains high (260 total/24 hrs, 80->180). Hopefully can d/c remaining CT soon.   AF, now SR but having a lot of ectopy. Continue po Amio, will increase to 400 mg bid.  BP stable on Losartan, may need to add beta blocker to help with HR.  Pulm- continue aggressive pulm toilet/IS/FV, prn Xopenex.  Ambulate in halls, d/c On-Q.   LOS: 4 days    COLLINS,GINA H 04/30/2015  Patient seen and examined, agree with above  Remo Lipps C. Roxan Hockey, MD [facility] [phone]

## 2015-05-01 ENCOUNTER — Inpatient Hospital Stay (HOSPITAL_COMMUNITY): Payer: Medicare HMO

## 2015-05-01 NOTE — Progress Notes (Signed)
RN gave pt PRN lactulose. Post administration patient vomited x1. Pt vss and he stated he still wanted to attempt a BM.   Will continue to monitor.   Earlie Lou

## 2015-05-01 NOTE — Progress Notes (Addendum)
       [address]       [place] [address]             [phone]          5 Days Post-Op Procedure(s) (LRB): VIDEO BRONCHOSCOPY (N/A) LEFT VIDEO ASSISTED THORACOSCOPY (VATS) WITH LEFT LOWER LOBECTOMY (Left) LYMPH NODE DISSECTION (Left)  Subjective: Nauseated and emesis x 1 after receiving Lactulose this am. He did have a BM.  Breathing stable.   Objective: Vital signs in last 24 hours: Patient Vitals for the past 24 hrs:  BP Temp Temp src Pulse Resp SpO2  05/01/15 0539 (!) 136/45 mmHg 98.5 F (36.9 C) Oral 72 18 93 %  05/01/15 0110 - - - - - 96 %  04/30/15 2042 (!) 123/46 mmHg 98.2 F (36.8 C) Oral 71 16 96 %  04/30/15 1949 - - - - - 97 %  04/30/15 1636 (!) 114/49 mmHg - - - - -  04/30/15 1500 (!) 101/44 mmHg 98.2 F (36.8 C) Oral 65 17 97 %  04/30/15 0944 130/84 mmHg - - (!) 143 - -  04/30/15 0937 130/84 mmHg - - - - -   Current Weight  04/30/15 211 lb (95.709 kg)     Intake/Output from previous day: 10/15 0701 - 10/16 0700 In: 720 [P.O.:720] Out: 1625 [Urine:1375; Chest Tube:250]    PHYSICAL EXAM:  Heart: RRR Lungs: Clear Wound: Clean and dry Chest tube: No air leak    Lab Results: CBC:No results for input(s): WBC, HGB, HCT, PLT in the last 72 hours. BMET: No results for input(s): NA, K, CL, CO2, GLUCOSE, BUN, CREATININE, CALCIUM in the last 72 hours.  PT/INR: No results for input(s): LABPROT, INR in the last 72 hours.  CXR: FINDINGS: left chest tube, unchanged in position. Midline trachea. Mild cardiomegaly. No pleural effusion or pneumothorax. Presumed thoracotomy defects of left-sided ribs. no change in left base atelectasis.  IMPRESSION: Left-sided chest tube remaining in place, without pneumothorax.  Similar left base atelectasis.   Assessment/Plan: S/P Procedure(s) (LRB): VIDEO BRONCHOSCOPY (N/A) LEFT VIDEO ASSISTED THORACOSCOPY (VATS) WITH LEFT LOWER LOBECTOMY (Left) LYMPH NODE DISSECTION (Left)  CT with no  air leak, CXR stable. CT output stable, 250 total/24 hrs. Hopefully can d/c remaining CT soon.   AF, now maintaining SR. Continue po Amio,Lopressor, Losartan for BP.  Pulm- continue aggressive pulm toilet/IS/FV, prn Xopenex.  Ambulate in halls.   LOS: 5 days    COLLINS,GINA H 05/01/2015  Patient seen and examined, agree with above  Remo Lipps C. Roxan Hockey, MD [facility] [phone]

## 2015-05-01 NOTE — Progress Notes (Signed)
Utilization review completed.  

## 2015-05-02 ENCOUNTER — Inpatient Hospital Stay (HOSPITAL_COMMUNITY): Payer: Medicare HMO

## 2015-05-02 LAB — TYPE AND SCREEN
ABO/RH(D): A POS
Antibody Screen: NEGATIVE
Donor AG Type: NEGATIVE
Donor AG Type: NEGATIVE
Donor AG Type: NEGATIVE
Donor AG Type: NEGATIVE
Unit division: 0
Unit division: 0
Unit division: 0
Unit division: 0

## 2015-05-02 MED ORDER — IPRATROPIUM BROMIDE 0.02 % IN SOLN: 0.5000 mg | Freq: Four times a day (QID) | RESPIRATORY_TRACT | Status: DC | PRN

## 2015-05-02 MED ORDER — AMIODARONE HCL 200 MG PO TABS
200.0000 mg | ORAL_TABLET | Freq: Two times a day (BID) | ORAL | Status: DC
  Administered 2015-05-02 – 2015-05-03 (×3): 200 mg via ORAL
  Filled 2015-05-02 (×2): qty 1

## 2015-05-02 MED ORDER — LEVALBUTEROL HCL 0.63 MG/3ML IN NEBU: 0.6300 mg | INHALATION_SOLUTION | Freq: Four times a day (QID) | RESPIRATORY_TRACT | Status: DC | PRN

## 2015-05-02 NOTE — Progress Notes (Addendum)
      [address]       [place] [address]             [phone]       6 Days Post-Op Procedure(s) (LRB): VIDEO BRONCHOSCOPY (N/A) LEFT VIDEO ASSISTED THORACOSCOPY (VATS) WITH LEFT LOWER LOBECTOMY (Left) LYMPH NODE DISSECTION (Left)  Subjective: Patient denies nausea this am. He had a bowel movement yesterday and is passing flatus.  Objective: Vital signs in last 24 hours: Temp:  [97.9 F (36.6 C)-98.7 F (37.1 C)] 97.9 F (36.6 C) (10/17 0428) Pulse Rate:  [69-75] 71 (10/17 0428) Cardiac Rhythm:  [-] Normal sinus rhythm (10/16 1900) Resp:  [17-18] 18 (10/17 0428) BP: (115-134)/(48-52) 134/52 mmHg (10/17 0428) SpO2:  [92 %-95 %] 92 % (10/17 0428)     Intake/Output from previous day: 10/16 0701 - 10/17 0700 In: -  Out: 1280 [Urine:1100; Chest Tube:180]   Physical Exam:  Cardiovascular: RRR. Pulmonary: Clear to auscultation bilaterally; no rales, wheezes, or rhonchi. Abdomen: Soft, non tender, bowel sounds present. Extremities:No lower extremity edema. Wounds: Clean and dry.  No erythema or signs of infection. Chest Tube: to water seal and there is some tidling with cough but no true air leak  Lab Results: CBC:No results for input(s): WBC, HGB, HCT, PLT in the last 72 hours. BMET: No results for input(s): NA, K, CL, CO2, GLUCOSE, BUN, CREATININE, CALCIUM in the last 72 hours.  PT/INR: No results for input(s): LABPROT, INR in the last 72 hours. ABG:  INR: Will add last result for INR, ABG once components are confirmed Will add last 4 CBG results once components are confirmed  Assessment/Plan:  1. CV - Previous a fib. Maintaining SR in the 70's this am. On Lopressor 12.5 mg bid, Amiodarone 400 mg bid, Hydralazine 25 mg tid, and Losartan 50 mg daily. Will decrease Amiodarone. 2.  Pulmonary - Chest tube 180 last 24 hours. CXR this am shows left base atelectasis, no pneumothorax. On room air. Hope to remove chest tube soon.Encourage incentive  spirometer.    ZIMMERMAN,DONIELLE MPA-C 05/02/2015,7:43 AM  Chest tube out today Fu chest xray in am Poss d/c in am Will need follow with Dr Luther Parody since now on Cordarone for transient afib  I have seen and examined [name] and agree with the above assessment  and plan.  Grace Isaac MD Beeper [phone] Office [phone] 05/02/2015 10:48 PM

## 2015-05-02 NOTE — Progress Notes (Signed)
Chest tube discontinued without problems, suture tied. Pt tolerated well.

## 2015-05-02 NOTE — Progress Notes (Signed)
CSW met with pt to discuss potential SNF for 24 hour supervision- patient would prefer to go home and states the has help from church friends who can assist when he returns homes- states MD is ok with him going home when medically ready as long as he has assistance  CSW confirmed plan for patient to return home with PA as long as patient has assistance  CSW informed RNCM of plan for home health  CSW signing off  Domenica Reamer, [place] Worker [phone]

## 2015-05-02 NOTE — Clinical Social Work Note (Signed)
Clinical Social Work Assessment  Patient Details  Name: [name] MRN: [id number] Date of Birth: [birth year]  Date of referral:  05/02/15               Reason for consult:  Facility Placement                Permission sought to share information with:  Family Supports Permission granted to share information::  Yes, Verbal Permission Granted  Name::        Agency::  [facility]  Relationship::     Contact Information:     Housing/Transportation Living arrangements for the past 2 months:  Single Family Home Source of Information:  Patient Patient Interpreter Needed:  None Criminal Activity/Legal Involvement Pertinent to Current Situation/Hospitalization:  No - Comment as needed Significant Relationships:  Friend Lives with:  Self Do you feel safe going back to the place where you live?  Yes Need for family participation in patient care:  No (Coment)  Care giving concerns:  Patient lives at home alone and would not have 24 hour assistance at time of DC   Facilities manager / plan: CSW spoke with pt concerning potential need for SNF for 24 hour supervision following surgery  Employment status:  Retired Nurse, adult PT Recommendations:  [place], [address] [place] / Referral to community resources:  [place]  Patient/Family's Response to care:  Patient is agreeable to SNF if that is what the MD is recommending but patient would like to return home if possible- states that the MD was thinking he could return home with help from his friends/church family who could offer assistance at home for first week or so.  Patient feels confident in going home and would only go to facility if that is what dr thought was needed.  Patient/Family's Understanding of and Emotional Response to Diagnosis, Current Treatment, and Prognosis: Patient seems to have a good understanding of current  condition/prognosis.  Patient is feeling great about his recovery so far and is hopeful to return home.  Emotional Assessment Appearance:  Appears stated age Attitude/Demeanor/Rapport:    Affect (typically observed):  Appropriate Orientation:  Oriented to Self, Oriented to Place, Oriented to  Time, Oriented to Situation Alcohol / Substance use:  Not Applicable Psych involvement (Current and /or in the community):  No (Comment)  Discharge Needs  Concerns to be addressed:  Home Safety Concerns, Lack of Support Readmission within the last 30 days:  No Current discharge risk:  Lack of support system Barriers to Discharge:  Continued Medical Work up   [employer], LCSW 05/02/2015, 8:39 AM

## 2015-05-02 NOTE — Progress Notes (Signed)
Physical Therapy Treatment and Discharge Patient Details Name: [name] MRN: [id number] DOB: 1941-04-16 Today's Date: 05/02/2015    History of Present Illness Pt is a 74 y/o male admitted with sarcomatous lung CA, s/p VATS for L LL lobectomy, post-op chest tube remains PMHx paroxysmal Afib    PT Comments    Patient ambulating well with or without RW and very mindful of managing his chest tube independently. No balance deficits detected. Noted patient has arranged to have family/friends assist him on discharge. No further PT needs and discharge from PT.    Follow Up Recommendations  No PT follow up     Equipment Recommendations  None recommended by PT    Recommendations for Other Services       Precautions / Restrictions Precautions Precautions: None Precaution Comments: pt very mindful of chest tube and able to manage independently    Mobility  Bed Mobility               General bed mobility comments: Not test in chair already  Transfers Overall transfer level: Modified independent Equipment used: Rolling walker (2 wheeled) Transfers: Sit to/from Stand Sit to Stand: Modified independent (Device/Increase time)            Ambulation/Gait Ambulation/Gait assistance: Modified independent (Device/Increase time);Supervision Ambulation Distance (Feet): 350 Feet Assistive device: Rolling walker (2 wheeled);None Gait Pattern/deviations: WFL(Within Functional Limits);Wide base of support Gait velocity: moderate Gait velocity interpretation: at or above normal speed for age/gender General Gait Details: steady, moderate speed.  Able to walk without RW (with carrying Chest tube equipment) but feels more secure with RW for equipment.   Stairs            Wheelchair Mobility    Modified Rankin (Stroke Patients Only)       Balance     Sitting balance-Leahy Scale: Good       Standing balance-Leahy Scale: Good                       Cognition Arousal/Alertness: Awake/alert Behavior During Therapy: WFL for tasks assessed/performed Overall Cognitive Status: Within Functional Limits for tasks assessed                      Exercises      General Comments General comments (skin integrity, edema, etc.): Reviewed use of incentive spirometer and flutter valve including frequency       Pertinent Vitals/Pain Pain Assessment: Faces Faces Pain Scale: Hurts even more Pain Location: Lt chest when coughoing Pain Descriptors / Indicators: Operative site guarding;Sharp Pain Intervention(s): Limited activity within patient's tolerance;Monitored during session;Repositioned (reminded to splint his chest with cough)    Home Living                      Prior Function            PT Goals (current goals can now be found in the care plan section) Acute Rehab PT Goals Patient Stated Goal: Home, Independent, back to normal activity PT Goal Formulation: With patient Time For Goal Achievement: 05/12/15 Potential to Achieve Goals: Good Progress towards PT goals: Goals met/education completed, patient discharged from PT    Frequency       PT Plan Discharge plan needs to be updated    Co-evaluation             End of Session   Activity Tolerance: Patient tolerated treatment well Patient left: in chair;with  call bell/phone within reach     Time: 1133-1141 PT Time Calculation (min) (ACUTE ONLY): 8 min  Charges:  $Gait Training: 8-22 mins                    G Codes:      Anani Gu 05-26-2015, 11:51 AM Pager [phone]

## 2015-05-02 NOTE — Care Management Note (Signed)
Case Management Note Marvetta Gibbons RN, BSN Unit 2W-Case Manager [phone]  Patient Details  Name: [name] MRN: [id number] Date of Birth: 12/04/1940  Subjective/Objective:      Pt admitted s/p VATS with lobectomy             Action/Plan: PTA pt lived at home alone- pt to tx to SDU today- NCM to follow pt progression for d/c needs  Expected Discharge Date:                  Expected Discharge Plan:  [place]  In-House Referral:     Discharge planning Services  CM Consult  Post Acute Care Choice:    Choice offered to:     DME Arranged:    DME Agency:     HH Arranged:    [place] Agency:     Status of Service:  In process, will continue to follow  Medicare Important Message Given:  Yes-second notification given Date Medicare IM Given:    Medicare IM give by:    Date Additional Medicare IM Given:    Additional Medicare Important Message give by:     If discussed at [place] of Stay Meetings, dates discussed: 05/03/15   Additional Comments:  05/02/15- per ICU CM notes- Pt too good for SNF - Staff states Dr. Darnell Level ok with him going home as long as he has a plan for friends checking on him periodically, staying with him periodically, providing food and groceries. He tells staff he can do this- that he has church friends that will be able to check on him and assist him. Pt prefers to return home and not STSNF.  Dawayne Patricia, RN 05/02/2015, 11:50 AM

## 2015-05-03 ENCOUNTER — Inpatient Hospital Stay (HOSPITAL_COMMUNITY): Payer: Medicare HMO

## 2015-05-03 MED ORDER — TRAMADOL HCL 50 MG PO TABS: 50.0000 mg | ORAL_TABLET | Freq: Four times a day (QID) | ORAL | Status: DC | PRN

## 2015-05-03 MED ORDER — METOPROLOL TARTRATE 25 MG PO TABS: 12.5000 mg | ORAL_TABLET | Freq: Two times a day (BID) | ORAL | Status: DC

## 2015-05-03 MED ORDER — AMIODARONE HCL 200 MG PO TABS: 200.0000 mg | ORAL_TABLET | Freq: Two times a day (BID) | ORAL | Status: DC

## 2015-05-03 NOTE — Progress Notes (Signed)
Discharged with prescriptions and instructions, pt. Voiced understanding. Denies any pain and discomfort.

## 2015-05-03 NOTE — Progress Notes (Addendum)
      [address]       [place] [address]             [phone]       7 Days Post-Op Procedure(s) (LRB): VIDEO BRONCHOSCOPY (N/A) LEFT VIDEO ASSISTED THORACOSCOPY (VATS) WITH LEFT LOWER LOBECTOMY (Left) LYMPH NODE DISSECTION (Left)  Subjective: Patient without complaints this am. He hopes to go home.  Objective: Vital signs in last 24 hours: Temp:  [97.9 F (36.6 C)-98.8 F (37.1 C)] 97.9 F (36.6 C) (10/18 0500) Pulse Rate:  [61-72] 61 (10/18 0500) Cardiac Rhythm:  [-] Normal sinus rhythm (10/17 1900) Resp:  [16-19] 16 (10/18 0500) BP: (112-131)/(45-57) 131/56 mmHg (10/18 0500) SpO2:  [93 %-97 %] 97 % (10/18 0500)     Intake/Output from previous day: 10/17 0701 - 10/18 0700 In: 480 [P.O.:480] Out: 650 [Urine:650]   Physical Exam:  Cardiovascular: RRR. Pulmonary: Clear to auscultation bilaterally; no rales, wheezes, or rhonchi. Abdomen: Soft, non tender, bowel sounds present. Extremities:No lower extremity edema. Wounds: Clean and dry.  No erythema or signs of infection.   Lab Results: CBC:No results for input(s): WBC, HGB, HCT, PLT in the last 72 hours. BMET: No results for input(s): NA, K, CL, CO2, GLUCOSE, BUN, CREATININE, CALCIUM in the last 72 hours.  PT/INR: No results for input(s): LABPROT, INR in the last 72 hours. ABG:  INR: Will add last result for INR, ABG once components are confirmed Will add last 4 CBG results once components are confirmed  Assessment/Plan:  1. CV - Previous a fib. Maintaining SR in the 60's this am. On Lopressor 12.5 mg bid, Amiodarone 200 mg bid, Hydralazine 25 mg tid, and Losartan 50 mg daily.  2.  Pulmonary - On room air. CXR appears to show no pneumothorax, left base atelectasis.Encourage incentive spirometer.  3. Probable discharge later today  ZIMMERMAN,DONIELLE MPA-C 05/03/2015,7:57 AM   Chest xray ok, Patient has neighbors and numerous church friends who will help him at home, he prefers not  to go to snf. I have seen and examined Ruben Reason and agree with the above assessment  and plan.  Grace Isaac MD [facility] [phone] Office [phone] 05/03/2015 12:17 PM

## 2015-05-03 NOTE — Care Management Important Message (Signed)
Important Message  Patient Details  Name: [name] MRN: [id number] Date of Birth: 09-02-1940   Medicare Important Message Given:  Yes-third notification given    Nathen May 05/03/2015, 12:55 PM

## 2015-05-03 NOTE — Care Management Note (Signed)
Case Management Note Marvetta Gibbons RN, BSN Unit 2W-Case Manager [phone]  Patient Details  Name: [name] MRN: [id number] Date of Birth: [birth year]  Subjective/Objective:      Pt admitted s/p VATS with lobectomy             Action/Plan: PTA pt lived at home alone- pt to tx to SDU today- NCM to follow pt progression for d/c needs  Expected Discharge Date:     05/03/15             Expected Discharge Plan:  Home/Self Care  In-House Referral:     Discharge planning Services  CM Consult  Post Acute Care Choice:    Choice offered to:     DME Arranged:    DME Agency:     HH Arranged:    [place] Agency:     Status of Service:  Completed, signed off  Medicare Important Message Given:  Yes-second notification given Date Medicare IM Given:    Medicare IM give by:    Date Additional Medicare IM Given:    Additional Medicare Important Message give by:     If discussed at [place] of Stay Meetings, dates discussed: 05/03/15   Additional Comments:  05/02/15- per ICU CM notes- Pt too good for SNF - Staff states Dr. Darnell Level ok with him going home as long as he has a plan for friends checking on him periodically, staying with him periodically, providing food and groceries. He tells staff he can do this- that he has church friends that will be able to check on him and assist him. Pt prefers to return home and not STSNF.  Dawayne Patricia, RN 05/03/2015, 11:48 AM

## 2015-05-10 ENCOUNTER — Encounter: Payer: Self-pay | Admitting: Nurse Practitioner

## 2015-05-10 ENCOUNTER — Ambulatory Visit (INDEPENDENT_AMBULATORY_CARE_PROVIDER_SITE_OTHER): Payer: Medicare HMO | Admitting: Nurse Practitioner

## 2015-05-10 VITALS — BP 130/70 | HR 62 | Temp 97.9°F | Resp 20 | Ht 67.0 in | Wt 216.2 lb

## 2015-05-10 DIAGNOSIS — Z902 Acquired absence of lung [part of]: Secondary | ICD-10-CM | POA: Diagnosis not present

## 2015-05-10 DIAGNOSIS — I10 Essential (primary) hypertension: Secondary | ICD-10-CM | POA: Diagnosis not present

## 2015-05-10 DIAGNOSIS — I480 Paroxysmal atrial fibrillation: Secondary | ICD-10-CM | POA: Diagnosis not present

## 2015-05-10 DIAGNOSIS — Z720 Tobacco use: Secondary | ICD-10-CM

## 2015-05-10 NOTE — Progress Notes (Signed)
Patient ID: [name], male   DOB: 25-Jul-1940, 74 y.o.   MRN: [id number]    PCP: [name], NP  Advanced Directive information Does patient have an advance directive?: No, Would patient like information on creating an advanced directive?: Yes - Educational materials given  Allergies  Allergen Reactions  . Asa [Aspirin] Swelling    Eyes and lip swelling    Chief Complaint  Patient presents with  . Hospitalization Follow-up    Hospital follow-up after lung surgery      HPI: Patient is a 74 y.o. male seen in the office today to follow up hospitalization.   He was taken to the operating room and underwent Left VATS with Mini Thoracotomy with left lower lobectomy and lymph node sampling. He had some brief Atrial Fibrillation for which he was treated with Amiodarone. He converted and remains in sinus rhythm on oral Amiodarone and Lopressor.   Pt feels like he is doing better than what the surgeon expected but he is in a lot of pain. Not taking ultram, only taking tylenol because he does not like to be on pain medication. Has frequent side effects and does not wish to have side effects. Pt reports he is hurting all over and making him not feel well.   No fast heart rate, palpitation, chest pains since he has been home. Review of Systems:  Review of Systems  Constitutional: Negative for activity change, appetite change, fatigue and unexpected weight change.  HENT: Negative for congestion.   Eyes: Negative.   Respiratory: Negative for cough, shortness of breath and wheezing.        Pain noted to surgery site, not taking medication  Cardiovascular: Negative for chest pain, palpitations and leg swelling.  Gastrointestinal: Negative for abdominal pain, diarrhea and constipation.  Genitourinary: Negative for dysuria and difficulty urinating.  Skin: Negative for color change and wound.  Neurological: Negative for dizziness.    Past Medical History  Diagnosis Date  .  Acute appendicitis with peritoneal abscess 01/28/2013  . Ileus, postoperative 01/28/2013  . Tobacco use disorder 01/28/2013  . Obesity, morbid ([place]) 01/28/2013  . Shortness of breath   . Essential hypertension 04/16/2014  . Elevated PSA   . Bacteremia September 2015  . A-fib ([place])   . Lung cancer ([place])   . Arthritis    Past Surgical History  Procedure Laterality Date  . Back surgery  1997, 2003    Dr.Apleton   . Laparoscopic appendectomy N/A 01/25/2013    Procedure: APPENDECTOMY LAPAROSCOPIC;  Surgeon: Ralene Ok, MD;  Location: [facility];  Service: General;  Laterality: N/A;  . Tee without cardioversion N/A 04/07/2014    Procedure: TRANSESOPHAGEAL ECHOCARDIOGRAM (TEE);  Surgeon: Candee Furbish, MD;  Location: [facility];  Service: Cardiovascular;  Laterality: N/A;  . Shoulder surgery Right 2014    Dr.Whitfield rotator cuff surgery  . Knee surgery Bilateral   . Appendectomy    . Carpal tunnel release    . Colonoscopy      x 3  . Eye surgery Left     muscle of the eye   . Video bronchoscopy N/A 04/26/2015    Procedure: VIDEO BRONCHOSCOPY;  Surgeon: Grace Isaac, MD;  Location: [facility];  Service: Thoracic;  Laterality: N/A;  . Video assisted thoracoscopy (vats)/wedge resection Left 04/26/2015    Procedure: LEFT VIDEO ASSISTED THORACOSCOPY (VATS) WITH LEFT LOWER LOBECTOMY;  Surgeon: Grace Isaac, MD;  Location: [facility];  Service: Thoracic;  Laterality: Left;  .  Lymph node dissection Left 04/26/2015    Procedure: LYMPH NODE DISSECTION;  Surgeon: Grace Isaac, MD;  Location: [facility];  Service: Thoracic;  Laterality: Left;   Social History:   reports that he has quit smoking. His smoking use included Cigars and Cigarettes. He started smoking about 56 years ago. He smoked 0.00 packs per day for 50 years. He has never used smokeless tobacco. He reports that he does not drink alcohol or use illicit drugs.  Family History  Problem Relation Age of Onset  . Irritable bowel syndrome  Mother   . Cancer Neg Hx   . Lung disease Neg Hx     Medications: Patient's Medications  New Prescriptions   No medications on file  Previous Medications   AMIODARONE (PACERONE) 200 MG TABLET    Take 1 tablet (200 mg total) by mouth 2 (two) times daily. For one week;then take Amiodarone 200 mg daily thereafter   ASPIRIN EC 81 MG TABLET    Take 1 tablet (81 mg total) by mouth daily.   HYDRALAZINE (APRESOLINE) 25 MG TABLET    Take one tablet by mouth three times daily for blood pressure   LOSARTAN (COZAAR) 50 MG TABLET    TAKE 1 TABLET BY MOUTH EVERY DAY FOR BLOOD PRESSURE   METOPROLOL TARTRATE (LOPRESSOR) 25 MG TABLET    Take 0.5 tablets (12.5 mg total) by mouth 2 (two) times daily.   NYSTATIN (MYCOSTATIN/NYSTOP) 100000 UNIT/GM POWD    Apply to groin 1-2 times daily   OVER THE COUNTER MEDICATION    Take 1 capsule by mouth 2 (two) times daily. Prosvent Ultra Blend Natural Prostate Health Supplement   TRAMADOL (ULTRAM) 50 MG TABLET    Take 1-2 tablets (50-100 mg total) by mouth every 6 (six) hours as needed (mild pain).  Modified Medications   No medications on file  Discontinued Medications   No medications on file     Physical Exam:  Filed Vitals:   05/10/15 1100  BP: 130/70  Pulse: 62  Temp: 97.9 F (36.6 C)  TempSrc: Oral  Resp: 20  Height: '5\' 7"'$  (1.702 m)  Weight: 216 lb 3.2 oz (98.068 kg)  SpO2: 94%   Body mass index is 33.85 kg/(m^2).  Physical Exam  Constitutional: He is oriented to person, place, and time. He appears well-developed and well-nourished.  Obese white male  Cardiovascular: Normal rate, regular rhythm and normal heart sounds.   Pulmonary/Chest: Effort normal and breath sounds normal.  Incision to left chest healing well with sutures to prior chest tube sites   Musculoskeletal: He exhibits no edema or tenderness.  Neurological: He is alert and oriented to person, place, and time.  Skin: Skin is warm and dry.    Labs reviewed: Basic Metabolic  Panel:  Recent Labs  04/27/15 0457 04/27/15 1810 04/27/15 1823 04/28/15 0351  NA 139 137  --  138  K 4.2 4.3  --  4.1  CL 102 104  --  101  CO2 26 26  --  27  GLUCOSE 104* 109*  --  123*  BUN 12 15  --  13  CREATININE 0.85 0.96  --  0.90  CALCIUM 8.5* 8.5*  --  8.7*  MG  --  2.5*  --   --   TSH  --   --  0.194*  --    Liver Function Tests:  Recent Labs  01/28/15 0828 04/20/15 1504 04/28/15 0351  AST '21 26 26  '$ ALT 18 24  23  ALKPHOS 80 74 55  BILITOT 0.5 0.6 0.5  PROT 6.5 7.1 6.4*  ALBUMIN 4.2 4.1 3.1*   No results for input(s): LIPASE, AMYLASE in the last 8760 hours. No results for input(s): AMMONIA in the last 8760 hours. CBC:  Recent Labs  07/27/14 0837 01/28/15 0828  04/27/15 0457 04/27/15 1810 04/28/15 0351  WBC 6.8 7.4  < > 10.0 12.4* 11.6*  NEUTROABS 4.6 5.2  --   --   --   --   HGB 17.6  --   < > 14.4 14.6 14.6  HCT 53.2* 50.3  < > 44.7 44.2 44.6  MCV 100*  --   < > 98.9 99.1 98.7  PLT 236  --   < > 175 169 169  < > = values in this interval not displayed. Lipid Panel:  Recent Labs  07/27/14 0837 01/28/15 0828  CHOL 181 159  HDL 51 41  LDLCALC 99 86  TRIG 154* 160*  CHOLHDL 3.5 3.9   TSH:  Recent Labs  04/27/15 1823  TSH 0.194*   A1C: Lab Results  Component Value Date   HGBA1C 5.3 07/27/2014     Assessment/Plan 1. S/P lobectomy of lung Due to pulmonary nodules, went to the operating room and underwent Left VATS with Mini Thoracotomy with left lower lobectomy and lymph node sampling. Has follow up scheduled with surgery  -increased pain has not taken any pain medication -encouraged to fill ultram and take as needed to decrease pain.  2. Paroxysmal atrial fibrillation (HCC) -during hospitalization, cont on amiodarone and metoprolol for rate control, in regular rhythm today -has follow up with cardiology scheduled  3. Tobacco abuse Has quit smoking, conts to be tobacco free  4. Essential hypertension Blood pressure stable,  conts on losartan, metoprolol and hydralazine   Keep scheduled follow up and following with cardiology and surgery  Cortney Beissel K. Harle Battiest  [facility] [phone] 8 am - 5 pm) [phone] (after hours)

## 2015-05-12 ENCOUNTER — Encounter: Payer: Self-pay | Admitting: Cardiology

## 2015-05-12 ENCOUNTER — Ambulatory Visit (INDEPENDENT_AMBULATORY_CARE_PROVIDER_SITE_OTHER): Payer: Medicare HMO | Admitting: Cardiology

## 2015-05-12 VITALS — BP 130/62 | HR 58 | Ht 67.0 in | Wt 215.8 lb

## 2015-05-12 DIAGNOSIS — I10 Essential (primary) hypertension: Secondary | ICD-10-CM

## 2015-05-12 DIAGNOSIS — C3432 Malignant neoplasm of lower lobe, left bronchus or lung: Secondary | ICD-10-CM

## 2015-05-12 DIAGNOSIS — I480 Paroxysmal atrial fibrillation: Secondary | ICD-10-CM

## 2015-05-12 MED ORDER — HYDRALAZINE HCL 25 MG PO TABS: ORAL_TABLET | ORAL | Status: DC

## 2015-05-12 NOTE — Addendum Note (Signed)
Addended by: [name] on: 05/12/2015 05:01 PM   Modules accepted: Level of Service

## 2015-05-12 NOTE — Patient Instructions (Signed)
Medication Instructions:  You may stop your Amiodarone. Continue all other medications as listed.  Follow-Up: Follow up in 3 months with Dr Marlou Porch.  If you need a refill on your cardiac medications before your next appointment, please call your pharmacy.  Thank you for choosing [facility]!!

## 2015-05-12 NOTE — Progress Notes (Signed)
[facility]. [address] [place], [place]  [address] Phone: [phone] Fax:  [phone]  Date:  05/12/2015   ID:  [name], DOB 01/12/1941, MRN [id number]  PCP:  [name], NP   History of Present Illness: [name] is a 74 y.o. male hospitalization in September 2015, atrial fibrillation with rapid ventricular response in the setting of infection who converted to normal sinus rhythm on amiodarone, diltiazem, previously chronic anticoagulation with Eliquis, normal echocardiogram with bacteremia, transesophageal echocardiogram showing no vegetations, reassuring.   He had another hospitalization for VATS, lung cancer and had postoperative atrial fibrillation. Took amiodarone, immediately converted. Maintaining Lopressor 12.5 g twice a day. Aspirin was once again tried but he developed angioedema.  MRI of back septic joints.  It is certainly plausible that his atrial fibrillation had an underlying reversible cause which was his severe infectious state in the hospital/postoperative state.    Wt Readings from Last 3 Encounters:  05/12/15 215 lb 12.8 oz (97.886 kg)  05/10/15 216 lb 3.2 oz (98.068 kg)  04/30/15 211 lb (95.709 kg)     Past Medical History  Diagnosis Date  . Acute appendicitis with peritoneal abscess 01/28/2013  . Ileus, postoperative 01/28/2013  . Tobacco use disorder 01/28/2013  . Obesity, morbid ([facility]) 01/28/2013  . Shortness of breath   . Essential hypertension 04/16/2014  . Elevated PSA   . Bacteremia September 2015  . A-fib ([place])   . Lung cancer ([facility])   . Arthritis     Past Surgical History  Procedure Laterality Date  . Back surgery  1997, 2003    Dr.Apleton   . Laparoscopic appendectomy N/A 01/25/2013    Procedure: APPENDECTOMY LAPAROSCOPIC;  Surgeon: Ralene Ok, MD;  Location: [facility];  Service: General;  Laterality: N/A;  . Tee without cardioversion N/A 04/07/2014    Procedure: TRANSESOPHAGEAL ECHOCARDIOGRAM (TEE);   Surgeon: Candee Furbish, MD;  Location: [facility];  Service: Cardiovascular;  Laterality: N/A;  . Shoulder surgery Right 2014    Dr.Whitfield rotator cuff surgery  . Knee surgery Bilateral   . Appendectomy    . Carpal tunnel release    . Colonoscopy      x 3  . Eye surgery Left     muscle of the eye   . Video bronchoscopy N/A 04/26/2015    Procedure: VIDEO BRONCHOSCOPY;  Surgeon: Grace Isaac, MD;  Location: [facility] OR;  Service: Thoracic;  Laterality: N/A;  . Video assisted thoracoscopy (vats)/wedge resection Left 04/26/2015    Procedure: LEFT VIDEO ASSISTED THORACOSCOPY (VATS) WITH LEFT LOWER LOBECTOMY;  Surgeon: Grace Isaac, MD;  Location: [facility];  Service: Thoracic;  Laterality: Left;  . Lymph node dissection Left 04/26/2015    Procedure: LYMPH NODE DISSECTION;  Surgeon: Grace Isaac, MD;  Location: [facility];  Service: Thoracic;  Laterality: Left;    Current Outpatient Prescriptions  Medication Sig Dispense Refill  . hydrALAZINE (APRESOLINE) 25 MG tablet Take one tablet by mouth three times daily for blood pressure 90 tablet 11  . losartan (COZAAR) 50 MG tablet TAKE 1 TABLET BY MOUTH EVERY DAY FOR BLOOD PRESSURE 30 tablet 3  . metoprolol tartrate (LOPRESSOR) 25 MG tablet Take 0.5 tablets (12.5 mg total) by mouth 2 (two) times daily. 30 tablet 1  . nystatin (MYCOSTATIN/NYSTOP) 100000 UNIT/GM POWD Apply to groin 1-2 times daily 30 g 3  . OVER THE COUNTER MEDICATION Take 1 capsule by mouth 2 (two) times daily. Prosvent  Ultra Blend Natural Prostate Health Supplement    . traMADol (ULTRAM) 50 MG tablet Take 1-2 tablets (50-100 mg total) by mouth every 6 (six) hours as needed (mild pain). 30 tablet 0   No current facility-administered medications for this visit.    Allergies:    Allergies  Allergen Reactions  . Asa [Aspirin] Anaphylaxis and Swelling    Angioedema/Eyes and lip swelling    Social History:  The patient  reports that he has quit smoking. His smoking use included  Cigars and Cigarettes. He started smoking about 56 years ago. He smoked 0.00 packs per day for 50 years. He has never used smokeless tobacco. He reports that he does not drink alcohol or use illicit drugs.   No early family history of coronary artery disease  ROS:  Please see the history of present illness.   Denies any syncope, bleeding, orthopnea, PND   All other systems reviewed and negative.   PHYSICAL EXAM: VS:  BP 130/62 mmHg  Pulse 58  Ht '5\' 7"'$  (1.702 m)  Wt 215 lb 12.8 oz (97.886 kg)  BMI 33.79 kg/m2  SpO2 95% Well nourished, well developed, in no acute distress HEENT: normal, [place]/AT, EOMI Neck: no JVD, normal carotid upstroke, no bruit Cardiac:  normal S1, S2; RRR; no murmur Lungs:  clear to auscultation bilaterally, no wheezing, rhonchi or rales Abd: soft, nontender, no hepatomegaly, no bruitsoverweight Ext: trace edema, 2+ distal pulses Skin: warm and dry GU: deferred Neuro: no focal abnormalities noted, AAO x 3  EKG: 11/29/14-sinus rhythm, 75, vertical axis ,borderline interventricular conduction delay. 08/12/14-normal rhythm, left axis deviation, vertical axis, poor R-wave progression. QTC 473 ms.    Labs: 07/27/14-liver functions normal, creatinine 0.9, hemoglobin 17.6. TSH from 04/01/14 was 0.9.  ECHO: 55% on TEE.   ASSESSMENT AND PLAN:  1. Paroxysmal atrial fibrillation-converted on amiodarone during hospitalization in September 2015 in the setting of bacteremia as well as during hospitalization for vats October 2016. In both situations, he had a reversible cause for his atrial fibrillation i.e. severe infection/surgery. Since he is demonstrated normal sinus rhythm since conversion, we have had discussion previously about discontinuation of both amiodarone as well as Eliquis. It is not unreasonable. We will go ahead and discontinue. I will continue with the Lopressor 12.5 g twice a day however. Of course, there is always a risk that he would return in to atrial  fibrillation unmonitored and potential stroke risk is present. Lab work reassuring. He will let me know if he feels any symptoms of atrial fibrillation, palpitations. We'll monitor again in clinic. 2. Chronic anticoagulation- discontinued Eliquis in the past. Used to take ASA for years but now is allergic.  Discontinued aspirin, did cause angioedema at home. 3. Osteomyelitis, resolved-Dr. Algis Downs note reviewed. Antibiotics discontinued . Resolved. Prior groin abscess however. 4. Tobacco use - urge to smoke after eating. Cut back. Cessation.  5. Obesity-continue to encourage weight loss. Risk factor for developing atrial fibrillation once again. 6. Essential hypertension-normal. Well controlled 7. 3 month follow-up.  Signed, Candee Furbish, MD [facility]  05/12/2015 9:14 AM

## 2015-05-13 ENCOUNTER — Ambulatory Visit (INDEPENDENT_AMBULATORY_CARE_PROVIDER_SITE_OTHER): Payer: Self-pay | Admitting: *Deleted

## 2015-05-13 DIAGNOSIS — Z4802 Encounter for removal of sutures: Secondary | ICD-10-CM

## 2015-05-13 DIAGNOSIS — Z902 Acquired absence of lung [part of]: Secondary | ICD-10-CM

## 2015-05-13 DIAGNOSIS — C3432 Malignant neoplasm of lower lobe, left bronchus or lung: Secondary | ICD-10-CM

## 2015-05-13 NOTE — Progress Notes (Signed)
Mr. Laday returns for suture removal from two previous chest tube sites s/p R VATS and RLLobectomy.  These sites are very well healed except for some suture irritation as they have been in for 17 days.  The small thoracotomy incision is very well healed.  Appetite and bowels are good.  He is using minimal pain medication.  He will return as scheduled with a chest xray.

## 2015-05-18 ENCOUNTER — Ambulatory Visit: Payer: Medicare HMO | Admitting: Cardiothoracic Surgery

## 2015-05-23 ENCOUNTER — Other Ambulatory Visit: Payer: Self-pay | Admitting: Cardiothoracic Surgery

## 2015-05-23 DIAGNOSIS — C3490 Malignant neoplasm of unspecified part of unspecified bronchus or lung: Secondary | ICD-10-CM

## 2015-05-24 ENCOUNTER — Encounter: Payer: Self-pay | Admitting: Cardiothoracic Surgery

## 2015-05-24 ENCOUNTER — Ambulatory Visit
Admission: RE | Admit: 2015-05-24 | Discharge: 2015-05-24 | Disposition: A | Discharge status: Home / Self Care | Payer: Medicare HMO | Source: Ambulatory Visit | Attending: Cardiothoracic Surgery | Admitting: Cardiothoracic Surgery

## 2015-05-24 ENCOUNTER — Ambulatory Visit (INDEPENDENT_AMBULATORY_CARE_PROVIDER_SITE_OTHER): Payer: Self-pay | Admitting: Cardiothoracic Surgery

## 2015-05-24 VITALS — BP 135/60 | HR 72 | Resp 20 | Ht 67.0 in | Wt 217.0 lb

## 2015-05-24 DIAGNOSIS — Z902 Acquired absence of lung [part of]: Secondary | ICD-10-CM

## 2015-05-24 DIAGNOSIS — C3490 Malignant neoplasm of unspecified part of unspecified bronchus or lung: Secondary | ICD-10-CM

## 2015-05-24 DIAGNOSIS — J948 Other specified pleural conditions: Secondary | ICD-10-CM

## 2015-05-24 DIAGNOSIS — C3432 Malignant neoplasm of lower lobe, left bronchus or lung: Secondary | ICD-10-CM

## 2015-05-24 DIAGNOSIS — J90 Pleural effusion, not elsewhere classified: Secondary | ICD-10-CM

## 2015-05-24 NOTE — Progress Notes (Signed)
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 PROCEDURE: Left lower lobectomy and lymph node dissection, video bronchoscopy, left video-assisted thoracoscopy, and placement of On-Q. SURGEON: Lanelle Bal, MD  Lung cancer, lower lobe [facility])   Staging form: Lung, AJCC 6th Edition     Pathologic stage from 04/25/2015: Stage IA (T1, N0, M0) - Signed by Grace Isaac, MD on 04/28/2015  History of Present Illness:     Patient doing well since discharge home, he is increasing his physical activity well. He wants to start cutting his grass he does have a riding lawnmower. He does have some paresthesias over the left chest. His wounds are healing well    Past Medical History  Diagnosis Date  . Acute appendicitis with peritoneal abscess 01/28/2013  . Ileus, postoperative 01/28/2013  . Tobacco use disorder 01/28/2013  . Obesity, morbid ([place]) 01/28/2013  . Shortness of breath   . Essential hypertension 04/16/2014  . Elevated PSA   . Bacteremia September 2015  . A-fib ([place])   . Lung cancer ([facility])   . Arthritis      History  Smoking status  . Former Smoker -- 0.00 packs/day for 50 years  . Types: Cigars, Cigarettes  . Start date: 07/16/1958  Smokeless tobacco  . Never Used    Comment: Peak use 2ppd of cigarettes- as of 04-20-15 has cut down to 5 cigarettes/day    History  Alcohol Use No     Allergies  Allergen Reactions  . Asa [Aspirin] Anaphylaxis and Swelling    Angioedema/Eyes and lip swelling    Current Outpatient Prescriptions  Medication Sig  Dispense Refill  . hydrALAZINE (APRESOLINE) 25 MG tablet Take one tablet by mouth three times daily for blood pressure 90 tablet 11  . losartan (COZAAR) 50 MG tablet TAKE 1 TABLET BY MOUTH EVERY DAY FOR BLOOD PRESSURE 30 tablet 3  . metoprolol tartrate (LOPRESSOR) 25 MG tablet Take 0.5 tablets (12.5 mg total) by mouth 2 (two) times daily. 30 tablet 1  . nystatin (MYCOSTATIN/NYSTOP) 100000 UNIT/GM POWD Apply to groin 1-2 times daily 30 g 3  . OVER THE COUNTER MEDICATION Take 1 capsule by mouth 2 (two) times daily. Prosvent Ultra Blend Natural Prostate Health Supplement    . traMADol (ULTRAM) 50 MG tablet Take 1-2 tablets (50-100 mg total) by mouth every 6 (six) hours as needed (mild pain). 30 tablet 0   No current facility-administered medications for this visit.       Physical Exam: BP 135/60 mmHg  Pulse 72  Resp 20  Ht '5\' 7"'$  (1.702 m)  Wt 217 lb (98.431 kg)  BMI 33.98 kg/m2  SpO2 97%  General appearance: alert, cooperative and no distress Neurologic: intact Heart: regular rate and rhythm, S1, S2 normal, no murmur, click, rub or gallop Lungs: clear to auscultation bilaterally Abdomen: soft, non-tender; bowel sounds normal; no masses,  no organomegaly Extremities: extremities normal, atraumatic, no cyanosis or edema and Homans sign is negative, no sign of DVT Wound: Left port sites chest  tube sites and incision are all healing well   Diagnostic Studies & Laboratory data:     Recent Radiology Findings:   Dg Chest 2 View  05/24/2015  CLINICAL DATA:  Malignant neoplasm of the lung. EXAM: CHEST  2 VIEW COMPARISON:  05/03/2015. FINDINGS: Mediastinum and hilar structures are stable. Heart size stable. Persistent lingular subsegmental atelectasis. Slight progression of left pleural effusion. Right lung is clear. IMPRESSION: Persistent left base subsegmental atelectasis and/or scarring. Slight increase in left pleural effusion. Electronically Signed   By: Marcello Moores  Register   On: 05/24/2015  15:49      Recent Lab Findings: Lab Results  Component Value Date   WBC 11.6* 04/28/2015   HGB 14.6 04/28/2015   HCT 44.6 04/28/2015   PLT 169 04/28/2015   GLUCOSE 123* 04/28/2015   CHOL 159 01/28/2015   TRIG 160* 01/28/2015   HDL 41 01/28/2015   LDLCALC 86 01/28/2015   ALT 23 04/28/2015   AST 26 04/28/2015   NA 138 04/28/2015   K 4.1 04/28/2015   CL 101 04/28/2015   CREATININE 0.90 04/28/2015   BUN 13 04/28/2015   CO2 27 04/28/2015   TSH 0.194* 04/27/2015   INR 1.00 04/20/2015   HGBA1C 5.3 07/27/2014      Assessment / Plan:    Patient doing well initially postop now about 3 weeks since left lower lobectomy for a 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 return in 4 weeks with chest xray    Grace Isaac MD      [address] [place] [address] Office [phone]   Beeper [phone]  05/24/2015 4:09 PM

## 2015-06-02 ENCOUNTER — Ambulatory Visit (INDEPENDENT_AMBULATORY_CARE_PROVIDER_SITE_OTHER): Payer: Medicare HMO | Admitting: Nurse Practitioner

## 2015-06-02 ENCOUNTER — Emergency Department (HOSPITAL_COMMUNITY)
Admission: EM | Admit: 2015-06-02 | Discharge: 2015-06-03 | Disposition: A | Discharge status: Home / Self Care | Payer: Medicare HMO | Attending: Emergency Medicine | Admitting: Emergency Medicine

## 2015-06-02 ENCOUNTER — Encounter (HOSPITAL_COMMUNITY): Payer: Self-pay

## 2015-06-02 ENCOUNTER — Telehealth: Payer: Self-pay | Admitting: *Deleted

## 2015-06-02 ENCOUNTER — Encounter: Payer: Self-pay | Admitting: Nurse Practitioner

## 2015-06-02 VITALS — BP 114/60 | HR 64 | Temp 98.1°F | Ht 67.0 in | Wt 216.0 lb

## 2015-06-02 DIAGNOSIS — Z8719 Personal history of other diseases of the digestive system: Secondary | ICD-10-CM | POA: Insufficient documentation

## 2015-06-02 DIAGNOSIS — Z87891 Personal history of nicotine dependence: Secondary | ICD-10-CM | POA: Diagnosis not present

## 2015-06-02 DIAGNOSIS — I10 Essential (primary) hypertension: Secondary | ICD-10-CM | POA: Insufficient documentation

## 2015-06-02 DIAGNOSIS — I4891 Unspecified atrial fibrillation: Secondary | ICD-10-CM | POA: Insufficient documentation

## 2015-06-02 DIAGNOSIS — T39395A Adverse effect of other nonsteroidal anti-inflammatory drugs [NSAID], initial encounter: Secondary | ICD-10-CM | POA: Diagnosis not present

## 2015-06-02 DIAGNOSIS — M5442 Lumbago with sciatica, left side: Secondary | ICD-10-CM | POA: Diagnosis not present

## 2015-06-02 DIAGNOSIS — Z85118 Personal history of other malignant neoplasm of bronchus and lung: Secondary | ICD-10-CM | POA: Diagnosis not present

## 2015-06-02 DIAGNOSIS — L299 Pruritus, unspecified: Secondary | ICD-10-CM | POA: Diagnosis present

## 2015-06-02 DIAGNOSIS — H578 Other specified disorders of eye and adnexa: Secondary | ICD-10-CM | POA: Insufficient documentation

## 2015-06-02 DIAGNOSIS — Z79899 Other long term (current) drug therapy: Secondary | ICD-10-CM | POA: Diagnosis not present

## 2015-06-02 DIAGNOSIS — M1990 Unspecified osteoarthritis, unspecified site: Secondary | ICD-10-CM | POA: Insufficient documentation

## 2015-06-02 DIAGNOSIS — T7840XA Allergy, unspecified, initial encounter: Secondary | ICD-10-CM

## 2015-06-02 LAB — COMPREHENSIVE METABOLIC PANEL
ALK PHOS: 89 U/L (ref 38–126)
ALT: 33 U/L (ref 17–63)
AST: 28 U/L (ref 15–41)
Albumin: 3.9 g/dL (ref 3.5–5.0)
Anion gap: 12 (ref 5–15)
BILIRUBIN TOTAL: 0.5 mg/dL (ref 0.3–1.2)
BUN: 24 mg/dL — AB (ref 6–20)
CO2: 24 mmol/L (ref 22–32)
Calcium: 9.3 mg/dL (ref 8.9–10.3)
Chloride: 105 mmol/L (ref 101–111)
Creatinine, Ser: 1.03 mg/dL (ref 0.61–1.24)
GFR calc Af Amer: >60 mL/min (ref >60)
Glucose, Bld: 183 mg/dL — ABNORMAL HIGH (ref 65–99)
Potassium: 4 mmol/L (ref 3.5–5.1)
Sodium: 141 mmol/L (ref 135–145)
Total Protein: 6.9 g/dL (ref 6.5–8.1)

## 2015-06-02 LAB — CBC
HEMATOCRIT: 47 % (ref 39.0–52.0)
Hemoglobin: 15.3 g/dL (ref 13.0–17.0)
MCH: 31.9 pg (ref 26.0–34.0)
MCHC: 32.6 g/dL (ref 30.0–36.0)
MCV: 97.9 fL (ref 78.0–100.0)
Platelets: 256 10*3/uL (ref 150–400)
RBC: 4.8 MIL/uL (ref 4.22–5.81)
RDW: 13.5 % (ref 11.5–15.5)
WBC: 9.2 10*3/uL (ref 4.0–10.5)

## 2015-06-02 MED ORDER — DIPHENHYDRAMINE HCL 25 MG PO CAPS
ORAL_CAPSULE | ORAL | Status: AC
  Filled 2015-06-02: qty 1

## 2015-06-02 MED ORDER — TRAMADOL HCL 50 MG PO TABS: ORAL_TABLET | ORAL | Status: DC

## 2015-06-02 MED ORDER — DEXAMETHASONE SODIUM PHOSPHATE 10 MG/ML IJ SOLN
10.0000 mg | Freq: Once | INTRAMUSCULAR | Status: AC
Start: 2015-06-02 — End: 2015-06-02
  Administered 2015-06-02: 10 mg via INTRAMUSCULAR
  Filled 2015-06-02: qty 1

## 2015-06-02 MED ORDER — DIPHENHYDRAMINE HCL 25 MG PO CAPS
25.0000 mg | ORAL_CAPSULE | Freq: Once | ORAL | Status: AC
  Administered 2015-06-02: 25 mg via ORAL

## 2015-06-02 MED ORDER — METHOCARBAMOL 500 MG PO TABS: 500.0000 mg | ORAL_TABLET | Freq: Four times a day (QID) | ORAL | Status: DC | PRN

## 2015-06-02 MED ORDER — FAMOTIDINE 20 MG PO TABS
20.0000 mg | ORAL_TABLET | Freq: Once | ORAL | Status: AC
Start: 2015-06-02 — End: 2015-06-02
  Administered 2015-06-02: 20 mg via ORAL
  Filled 2015-06-02: qty 1

## 2015-06-02 NOTE — ED Notes (Signed)
This RN spoke with [place], [place] and received order for Benadryl.

## 2015-06-02 NOTE — ED Notes (Signed)
Pt sitting in chair next to bed; reports feeling much better after medications. No hives noted. Minimal swelling noted to bottom lip. Respirations even and unlabored

## 2015-06-02 NOTE — Telephone Encounter (Signed)
Patient called and advised he would like an appt with Dr Johnnye Sima. He advised he is having back pain like when he saw Johnnye Sima last year. He advised was told by the doctor to call and get an appt if the pain comes back. Asked the patient if he has any recent labs or a Dx of ID. He advised no but knows how he feels he was insistent he needs to see Johnnye Sima and was told he could. Advised the patient will give him an appt but in the mean time he needs to try and get in with his PCP who may be able to treat or advise him if other provider is better to see. Patient given an appt for 07/04/15 at 9 am.

## 2015-06-02 NOTE — Patient Instructions (Signed)
May use heat 20 mins 2-3 times daily Aleve 1 tablet twice daily for 1 week Will get xray of lower back To use muscle relaxer as needed Cont tramadol as needed   Back Pain, Adult Back pain is very common in adults.The cause of back pain is rarely dangerous and the pain often gets better over time.The cause of your back pain may not be known. Some common causes of back pain include:  Strain of the muscles or ligaments supporting the spine.  Wear and tear (degeneration) of the spinal disks.  Arthritis.  Direct injury to the back. For many people, back pain may return. Since back pain is rarely dangerous, most people can learn to manage this condition on their own.  HOME CARE INSTRUCTIONS Watch your back pain for any changes. The following actions may help to lessen any discomfort you are feeling:  Remain active. It is stressful on your back to sit or stand in one place for long periods of time. Do not sit, drive, or stand in one place for more than 30 minutes at a time. Take short walks on even surfaces as soon as you are able.Try to increase the length of time you walk each day.  Exercise regularly as directed by your health care provider. Exercise helps your back heal faster. It also helps avoid future injury by keeping your muscles strong and flexible.  Do not stay in bed.Resting more than 1-2 days can delay your recovery.  Pay attention to your body when you bend and lift. The most comfortable positions are those that put less stress on your recovering back. Always use proper lifting techniques, including:  Bending your knees.  Keeping the load close to your body.  Avoiding twisting.  Find a comfortable position to sleep. Use a firm mattress and lie on your side with your knees slightly bent. If you lie on your back, put a pillow under your knees.  Avoid feeling anxious or stressed.Stress increases muscle tension and can worsen back pain.It is important to recognize when  you are anxious or stressed and learn ways to manage it, such as with exercise.  Take medicines only as directed by your health care provider. Over-the-counter medicines to reduce pain and inflammation are often the most helpful.Your health care provider may prescribe muscle relaxant drugs.These medicines help dull your pain so you can more quickly return to your normal activities and healthy exercise.  Apply ice to the injured area:  Put ice in a plastic bag.  Place a towel between your skin and the bag.  Leave the ice on for 20 minutes, 2-3 times a day for the first 2-3 days. After that, ice and heat may be alternated to reduce pain and spasms.  Maintain a healthy weight. Excess weight puts extra stress on your back and makes it difficult to maintain good posture. SEEK MEDICAL CARE IF:  You have pain that is not relieved with rest or medicine.  You have increasing pain going down into the legs or buttocks.  You have pain that does not improve in one week.  You have night pain.  You lose weight.  You have a fever or chills. SEEK IMMEDIATE MEDICAL CARE IF:   You develop new bowel or bladder control problems.  You have unusual weakness or numbness in your arms or legs.  You develop nausea or vomiting.  You develop abdominal pain.  You feel faint.   This information is not intended to replace advice given to you  by your health care provider. Make sure you discuss any questions you have with your health care provider.   Document Released: 07/02/2005 Document Revised: 07/23/2014 Document Reviewed: 11/03/2013 Elsevier Interactive Patient Education [employer].

## 2015-06-02 NOTE — ED Notes (Signed)
Pt here for allergic reaction, redness to bilateral periorbital areas and lips are mildly swollen. Airway is patent and pt able to speak in clear complete sentences. He says he thinks he may be having a reaction to either OTC naproxen or prescription bottle of Methocarbamol. He took the meds today around 1800.

## 2015-06-02 NOTE — ED Notes (Signed)
This RN went to reassess patient while in the waiting room. Pt sill A&Ox4, no acute distress and able to speak in clear complete sentences. Hives were noticed to be present on patients arms.

## 2015-06-02 NOTE — Progress Notes (Signed)
Patient ID: [name], male   DOB: 04/08/1941, 74 y.o.   MRN: [id number]    PCP: [name], NP  Advanced Directive information    Allergies  Allergen Reactions  . Asa [Aspirin] Anaphylaxis and Swelling    Angioedema/Eyes and lip swelling    Chief Complaint  Patient presents with  . Acute Visit    Lower back pain x 2-3 weeks, no known injury. Pain radiates down both legs at different times. Patient denies pain or discomfort when urinating.      HPI: Patient is a 74 y.o. male seen in the office today for increase in back pain. Pt with a pmh of a fib, htn, smoker, back pain. pt has had back pain since osteomyelitis of back last year. 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 Back pain had improved but now worse.  No new injury or over exertion.  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  Current back pain worse over last 2-3 week. Pain radiating down left leg.  No loss of bowel or bladder.   Review of Systems:  Review of Systems  Constitutional: Negative for activity change and fatigue.  Respiratory: Negative for shortness of breath.   Cardiovascular: Negative for chest pain and leg swelling.  Gastrointestinal: Negative.   Genitourinary: Negative for dysuria and difficulty urinating.  Musculoskeletal: Positive for back pain and arthralgias. Negative for myalgias, joint swelling, gait problem and neck stiffness.    Past Medical History  Diagnosis Date  . Acute appendicitis with peritoneal abscess 01/28/2013  . Ileus, postoperative 01/28/2013  . Tobacco use disorder 01/28/2013  . Obesity, morbid ([place]) 01/28/2013  . Shortness of breath   . Essential hypertension 04/16/2014  . Elevated PSA   . Bacteremia September 2015  . A-fib ([place])   . Lung cancer ([place])   . Arthritis    Past Surgical History  Procedure Laterality Date  . Back surgery  1997, 2003    Dr.Apleton   . Laparoscopic appendectomy  N/A 01/25/2013    Procedure: APPENDECTOMY LAPAROSCOPIC;  Surgeon: Ralene Ok, MD;  Location: [facility];  Service: General;  Laterality: N/A;  . Tee without cardioversion N/A 04/07/2014    Procedure: TRANSESOPHAGEAL ECHOCARDIOGRAM (TEE);  Surgeon: Candee Furbish, MD;  Location: [place];  Service: Cardiovascular;  Laterality: N/A;  . Shoulder surgery Right 2014    Dr.Whitfield rotator cuff surgery  . Knee surgery Bilateral   . Appendectomy    . Carpal tunnel release    . Colonoscopy      x 3  . Eye surgery Left     muscle of the eye   . Video bronchoscopy N/A 04/26/2015    Procedure: VIDEO BRONCHOSCOPY;  Surgeon: Grace Isaac, MD;  Location: [facility] OR;  Service: Thoracic;  Laterality: N/A;  . Video assisted thoracoscopy (vats)/wedge resection Left 04/26/2015    Procedure: LEFT VIDEO ASSISTED THORACOSCOPY (VATS) WITH LEFT LOWER LOBECTOMY;  Surgeon: Grace Isaac, MD;  Location: [place];  Service: Thoracic;  Laterality: Left;  . Lymph node dissection Left 04/26/2015    Procedure: LYMPH NODE DISSECTION;  Surgeon: Grace Isaac, MD;  Location: [place];  Service: Thoracic;  Laterality: Left;   Social History:   reports that he has quit smoking. His smoking use included Cigars and Cigarettes. He started smoking about 56 years ago. He smoked 0.00 packs per day for 50 years. He has never used smokeless tobacco.  He reports that he does not drink alcohol or use illicit drugs.  Family History  Problem Relation Age of Onset  . Irritable bowel syndrome Mother   . Cancer Neg Hx   . Lung disease Neg Hx     Medications: Patient's Medications  New Prescriptions   No medications on file  Previous Medications   HYDRALAZINE (APRESOLINE) 25 MG TABLET    Take one tablet by mouth three times daily for blood pressure   LOSARTAN (COZAAR) 50 MG TABLET    TAKE 1 TABLET BY MOUTH EVERY DAY FOR BLOOD PRESSURE   METOPROLOL TARTRATE (LOPRESSOR) 25 MG TABLET    Take 0.5 tablets (12.5 mg total) by mouth 2  (two) times daily.   NYSTATIN (MYCOSTATIN/NYSTOP) 100000 UNIT/GM POWD    Apply to groin 1-2 times daily   OVER THE COUNTER MEDICATION    Take 1 capsule by mouth 2 (two) times daily. Prosvent Ultra Blend Natural Prostate Health Supplement   TRAMADOL (ULTRAM) 50 MG TABLET    Take 1-2 tablets (50-100 mg total) by mouth every 6 (six) hours as needed (mild pain).  Modified Medications   No medications on file  Discontinued Medications   No medications on file     Physical Exam:  Filed Vitals:   06/02/15 1621  BP: 114/60  Pulse: 64  Temp: 98.1 F (36.7 C)  TempSrc: Oral  Height: '5\' 7"'$  (1.702 m)  Weight: 216 lb (97.977 kg)  SpO2: 95%   Body mass index is 33.82 kg/(m^2).  Physical Exam  Constitutional: He is oriented to person, place, and time. He appears well-developed and well-nourished.  Cardiovascular: Normal rate, regular rhythm and normal heart sounds.   Pulmonary/Chest: Effort normal and breath sounds normal.  Abdominal: Soft. Bowel sounds are normal. He exhibits no distension.  Musculoskeletal: Normal range of motion. He exhibits no edema or tenderness.  No point tenderness  Neurological: He is alert and oriented to person, place, and time.    Labs reviewed: Basic Metabolic Panel:  Recent Labs  04/27/15 0457 04/27/15 1810 04/27/15 1823 04/28/15 0351  NA 139 137  --  138  K 4.2 4.3  --  4.1  CL 102 104  --  101  CO2 26 26  --  27  GLUCOSE 104* 109*  --  123*  BUN 12 15  --  13  CREATININE 0.85 0.96  --  0.90  CALCIUM 8.5* 8.5*  --  8.7*  MG  --  2.5*  --   --   TSH  --   --  0.194*  --    Liver Function Tests:  Recent Labs  01/28/15 0828 04/20/15 1504 04/28/15 0351  AST '21 26 26  '$ ALT '18 24 23  '$ ALKPHOS 80 74 55  BILITOT 0.5 0.6 0.5  PROT 6.5 7.1 6.4*  ALBUMIN 4.2 4.1 3.1*   No results for input(s): LIPASE, AMYLASE in the last 8760 hours. No results for input(s): AMMONIA in the last 8760 hours. CBC:  Recent Labs  07/27/14 0837 01/28/15 0828   04/27/15 0457 04/27/15 1810 04/28/15 0351  WBC 6.8 7.4  < > 10.0 12.4* 11.6*  NEUTROABS 4.6 5.2  --   --   --   --   HGB 17.6  --   < > 14.4 14.6 14.6  HCT 53.2* 50.3  < > 44.7 44.2 44.6  MCV 100*  --   < > 98.9 99.1 98.7  PLT 236  --   < > 175 169  169  < > = values in this interval not displayed. Lipid Panel:  Recent Labs  07/27/14 0837 01/28/15 0828  CHOL 181 159  HDL 51 41  LDLCALC 99 86  TRIG 154* 160*  CHOLHDL 3.5 3.9   TSH:  Recent Labs  04/27/15 1823  TSH 0.194*   A1C: Lab Results  Component Value Date   HGBA1C 5.3 07/27/2014   08/02/14  FINDINGS: Endplate edema and enhancement from L2-3 to L4-5 seen on the prior examination with associated paraspinous inflammatory change persists but has improved at all 3 levels. No abscess is identified. Mild endplate destructive change at the affected levels is unchanged. No notable vertebral body height loss is identified. Fluid and mild enhancement edema about the right L1-2, L2-3 and L3-4 facet joints has also improved.  Convex left scoliosis is again seen. The patient has a congenitally narrow central canal. The conus medullaris is normal in signal and position. Multilevel degenerative disc disease is unchanged in appearance. Laminotomy defects are identified on the right at L3-4 and on the left at L4-5 and L5-S1. 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 No new abnormality since the prior study is identified.  IMPRESSION: Changes of diskitis and osteomyelitis at L2-3, L3-4 and L4-5 and likely septic facet joints on the left at L1-2, L2-3 and L3-4 persist but have improved since the most recent examination. No new abnormality is identified.    Assessment/Plan 1. Bilateral low back pain with left-sided sciatica Hx of back pain with disease to lumbar spine, previously seen by orthopedic for these issues but doctor retired, had been better but after osteomyelitis of back with increase pain - will  get DG Lumbar Spine Complete; Future and possible referral to neurosurgery  -to use heat to affected area - methocarbamol (ROBAXIN) 500 MG tablet; Take 1 tablet (500 mg total) by mouth every 6 (six) hours as needed for muscle spasms.  Dispense: 30 tablet; Refill: 0 - traMADol (ULTRAM) 50 MG tablet; 1-2 tablets every 6 hours as needed for pain- max 6 tablets in 24 hours  Dispense: 30 tablet; Refill: 0  Eason Housman K. Harle Battiest  [facility] [phone] 8 am - 5 pm) [phone] (after hours)

## 2015-06-02 NOTE — ED Provider Notes (Signed)
CSN: [id number]     Arrival date & time 06/02/15  2025 History   First MD Initiated Contact with Patient 06/02/15 2215     Chief Complaint  Patient presents with  . Allergic Reaction     (Consider location/radiation/quality/duration/timing/severity/associated sxs/prior Treatment) HPI [name] is a 74 y.o. male who comes in for evaluation of allergic reaction. Patient reports he is allergic to aspirin. His PCP today for evaluation of back pain and was given naproxen and a muscle relaxer to take. He reports associated for naproxen, he experienced intense itching all over his body and whelps on his forearms. He also reports left periorbital eye swelling. He denies any shortness of breath, nausea or vomiting, abdominal pain, chest pain. He reports taking Benadryl in the waiting room of the emergency department and states that the itching is doing much better and he feels better since his arrival. Denies any other allergies. No other aggravating or modifying factors.  Past Medical History  Diagnosis Date  . Acute appendicitis with peritoneal abscess 01/28/2013  . Ileus, postoperative 01/28/2013  . Tobacco use disorder 01/28/2013  . Obesity, morbid ([place]) 01/28/2013  . Shortness of breath   . Essential hypertension 04/16/2014  . Elevated PSA   . Bacteremia September 2015  . A-fib ([place])   . Lung cancer ([place])   . Arthritis    Past Surgical History  Procedure Laterality Date  . Back surgery  1997, 2003    Dr.Apleton   . Laparoscopic appendectomy N/A 01/25/2013    Procedure: APPENDECTOMY LAPAROSCOPIC;  Surgeon: Ralene Ok, MD;  Location: [place];  Service: General;  Laterality: N/A;  . Tee without cardioversion N/A 04/07/2014    Procedure: TRANSESOPHAGEAL ECHOCARDIOGRAM (TEE);  Surgeon: Candee Furbish, MD;  Location: [place];  Service: Cardiovascular;  Laterality: N/A;  . Shoulder surgery Right 2014    Dr.Whitfield rotator cuff surgery  . Knee surgery Bilateral   . Appendectomy    .  Carpal tunnel release    . Colonoscopy      x 3  . Eye surgery Left     muscle of the eye   . Video bronchoscopy N/A 04/26/2015    Procedure: VIDEO BRONCHOSCOPY;  Surgeon: Grace Isaac, MD;  Location: [facility] OR;  Service: Thoracic;  Laterality: N/A;  . Video assisted thoracoscopy (vats)/wedge resection Left 04/26/2015    Procedure: LEFT VIDEO ASSISTED THORACOSCOPY (VATS) WITH LEFT LOWER LOBECTOMY;  Surgeon: Grace Isaac, MD;  Location: [place];  Service: Thoracic;  Laterality: Left;  . Lymph node dissection Left 04/26/2015    Procedure: LYMPH NODE DISSECTION;  Surgeon: Grace Isaac, MD;  Location: [facility] OR;  Service: Thoracic;  Laterality: Left;   Family History  Problem Relation Age of Onset  . Irritable bowel syndrome Mother   . Cancer Neg Hx   . Lung disease Neg Hx    Social History  Substance Use Topics  . Smoking status: Former Smoker -- 0.00 packs/day for 50 years    Types: Cigars, Cigarettes    Start date: 07/16/1958  . Smokeless tobacco: Never Used     Comment: Peak use 2ppd of cigarettes- as of 04-20-15 has cut down to 5 cigarettes/day  . Alcohol Use: No    Review of Systems A 10 point review of systems was completed and was negative except for pertinent positives and negatives as mentioned in the history of present illness     Allergies  Asa  Home Medications   Prior to Admission medications  Medication Sig Start Date End Date Taking? Authorizing Provider  hydrALAZINE (APRESOLINE) 25 MG tablet Take one tablet by mouth three times daily for blood pressure 05/12/15  Yes Jerline Pain, MD  losartan (COZAAR) 50 MG tablet TAKE 1 TABLET BY MOUTH EVERY DAY FOR BLOOD PRESSURE 04/22/15  Yes [name], NP  methocarbamol (ROBAXIN) 500 MG tablet Take 1 tablet (500 mg total) by mouth every 6 (six) hours as needed for muscle spasms. 06/02/15  Yes [name], NP  metoprolol tartrate (LOPRESSOR) 25 MG tablet Take 0.5 tablets (12.5 mg total) by mouth 2 (two)  times daily. 05/03/15  Yes Donielle Liston Alba, PA-C  naproxen (NAPROSYN) 250 MG tablet Take 250 mg by mouth daily as needed for mild pain.   Yes Historical Provider, MD  nystatin (MYCOSTATIN/NYSTOP) 100000 UNIT/GM POWD Apply to groin 1-2 times daily 03/22/15  Yes [name], NP  OVER THE COUNTER MEDICATION Take 1 capsule by mouth 2 (two) times daily. Prosvent Ultra Blend Natural Prostate Health Supplement   Yes Historical Provider, MD  traMADol (ULTRAM) 50 MG tablet 1-2 tablets every 6 hours as needed for pain- max 6 tablets in 24 hours 06/02/15  Yes [name], NP   BP 129/66 mmHg  Pulse 64  Temp(Src) 98.6 F (37 C) (Oral)  Resp 18  SpO2 93% Physical Exam  Constitutional: He is oriented to person, place, and time. He appears well-developed and well-nourished.  HENT:  Head: Normocephalic and atraumatic.  Mouth/Throat: Oropharynx is clear and moist.  No angioedema. Oropharynx is clear and moist with a patent airway  Eyes: Conjunctivae are normal. Pupils are equal, round, and reactive to light. Right eye exhibits no discharge. Left eye exhibits no discharge. No scleral icterus.  Very mild, left periorbital eye swelling.  Neck: Normal range of motion. Neck supple.  Cardiovascular: Normal rate, regular rhythm and normal heart sounds.   Pulmonary/Chest: Effort normal and breath sounds normal. No respiratory distress. He has no wheezes. He has no rales.  Abdominal: Soft. There is no tenderness.  Musculoskeletal: He exhibits no tenderness.  Neurological: He is alert and oriented to person, place, and time.  Cranial Nerves II-XII grossly intact  Skin: Skin is warm and dry. No rash noted.  Psychiatric: He has a normal mood and affect.  Nursing note and vitals reviewed.   ED Course  Procedures (including critical care time) Labs Review Labs Reviewed  COMPREHENSIVE METABOLIC PANEL - Abnormal; Notable for the following:    Glucose, Bld 183 (*)    BUN 24 (*)    All other  components within normal limits  CBC    Imaging Review No results found. I have personally reviewed and evaluated these images and lab results as part of my medical decision-making.   EKG Interpretation None     Meds given in ED:  Medications  diphenhydrAMINE (BENADRYL) capsule 25 mg (25 mg Oral Given 06/02/15 2119)  dexamethasone (DECADRON) injection 10 mg (10 mg Intramuscular Given 06/02/15 2305)  famotidine (PEPCID) tablet 20 mg (20 mg Oral Given 06/02/15 2305)    New Prescriptions   No medications on file   Filed Vitals:   06/02/15 2036 06/02/15 2315 06/03/15 0045 06/03/15 0051  BP: 163/59 129/67 129/66 129/66  Pulse: 96 67 65 64  Temp: 98.6 F (37 C)     TempSrc: Oral     Resp: 20   18  SpO2: 94% 97% 92% 93%    MDM  Ruben Reason is a  74 y.o. male who presents for evaluation of allergic reaction. Patient reports he is allergic to aspirin and last time he took aspirin he broke out in hives, was itching all over his body. He reports taking naproxen today and experienced the same symptoms. No nausea, vomiting, abdominal pain, cardiopulmonary component. No evidence of anaphylaxis. Treated in the ED with Benadryl, Pepcid and IM Decadron. Observed for 2 hours and is doing much better. 00:45--upon reevaluation, patient appears to be doing better, swelling has reduced, benign cardiopulmonary exams. Itching also reduced. Patient will be discharged home and encouraged to continue Benadryl use and avoidance of NSAIDs. Patient agrees with this plan. Overall he appears well, nontoxic, hemodynamically stable, afebrile and is appropriate for discharge. Discussed follow-up with PCP in the next 1-2 days for reevaluation. No evidence of other acute or emergent pathology at this time. Final diagnoses:  Allergic reaction, initial encounter        Comer Locket, PA-C 06/03/15 0100  Tanna Furry, MD 06/09/15 [phone]

## 2015-06-02 NOTE — ED Notes (Signed)
Pt has hx of lung cancer and had lower lobe of left lung removed on October 11th.

## 2015-06-03 ENCOUNTER — Telehealth: Payer: Self-pay

## 2015-06-03 DIAGNOSIS — M5442 Lumbago with sciatica, left side: Secondary | ICD-10-CM

## 2015-06-03 NOTE — Discharge Instructions (Signed)
You were treated in the ED today for your likely allergic reaction. You may continue taking Benadryl at home as needed for your itching. Please avoid taking any other NSAIDs/naproxen/aspirin, as this may be the cause of your reaction. Please follow-up with your doctor in the next 1-2 days for reevaluation. Return to ED for any new or worsening symptoms.  Allergies An allergy is an abnormal reaction to a substance by the body's defense system (immune system). Allergies can develop at any age. WHAT CAUSES ALLERGIES? An allergic reaction happens when the immune system mistakenly reacts to a normally harmless substance, called an allergen, as if it were harmful. The immune system releases antibodies to fight the substance. Antibodies eventually release a chemical called histamine into the bloodstream. The release of histamine is meant to protect the body from infection, but it also causes discomfort. An allergic reaction can be triggered by:  Eating an allergen.  Inhaling an allergen.  Touching an allergen. WHAT TYPES OF ALLERGIES ARE THERE? There are many types of allergies. Common types include:  Seasonal allergies. People with this type of allergy are usually allergic to substances that are only present during certain seasons, such as molds and pollens.  Food allergies.  Drug allergies.  Insect allergies.  Animal dander allergies. WHAT ARE SYMPTOMS OF ALLERGIES? Possible allergy symptoms include:  Swelling of the lips, face, tongue, mouth, or throat.  Sneezing, coughing, or wheezing.  Nasal congestion.  Tingling in the mouth.  Rash.  Itching.  Itchy, red, swollen areas of skin (hives).  Watery eyes.  Vomiting.  Diarrhea.  Dizziness.  Lightheadedness.  Fainting.  Trouble breathing or swallowing.  Chest tightness.  Rapid heartbeat. HOW ARE ALLERGIES DIAGNOSED? Allergies are diagnosed with a medical and family history and one or more of the following:  Skin  tests.  Blood tests.  A food diary. A food diary is a record of all the foods and drinks you have in a day and of all the symptoms you experience.  The results of an elimination diet. An elimination diet involves eliminating foods from your diet and then adding them back in one by one to find out if a certain food causes an allergic reaction. HOW ARE ALLERGIES TREATED? There is no cure for allergies, but allergic reactions can be treated with medicine. Severe reactions usually need to be treated at a hospital. HOW CAN REACTIONS BE PREVENTED? The best way to prevent an allergic reaction is by avoiding the substance you are allergic to. Allergy shots and medicines can also help prevent reactions in some cases. People with severe allergic reactions may be able to prevent a life-threatening reaction called anaphylaxis with a medicine given right after exposure to the allergen.   This information is not intended to replace advice given to you by your health care provider. Make sure you discuss any questions you have with your health care provider.   Document Released: 09/25/2002 Document Revised: 07/23/2014 Document Reviewed: 04/13/2014 Elsevier Interactive Patient Education [employer].

## 2015-06-03 NOTE — Telephone Encounter (Signed)
Patient called to request a referral to a specialist for his back, patient would not like to delay the process of seeing a specialist by taking medications recommended yesterday. Please advise, if agreed please place referral.

## 2015-06-03 NOTE — Telephone Encounter (Signed)
This is fine, lets refer him to a neurosurgeon since it is his back

## 2015-06-06 ENCOUNTER — Ambulatory Visit
Admission: RE | Admit: 2015-06-06 | Discharge: 2015-06-06 | Disposition: A | Discharge status: Home / Self Care | Payer: Medicare HMO | Source: Ambulatory Visit | Attending: Nurse Practitioner | Admitting: Nurse Practitioner

## 2015-06-06 DIAGNOSIS — M5442 Lumbago with sciatica, left side: Secondary | ICD-10-CM

## 2015-06-06 NOTE — Telephone Encounter (Signed)
Referral order placed.

## 2015-06-23 ENCOUNTER — Ambulatory Visit: Payer: Medicare HMO | Admitting: Cardiothoracic Surgery

## 2015-06-27 ENCOUNTER — Other Ambulatory Visit: Payer: Self-pay | Admitting: Physician Assistant

## 2015-06-28 ENCOUNTER — Other Ambulatory Visit: Payer: Self-pay | Admitting: Cardiothoracic Surgery

## 2015-06-28 DIAGNOSIS — C3490 Malignant neoplasm of unspecified part of unspecified bronchus or lung: Secondary | ICD-10-CM

## 2015-06-29 ENCOUNTER — Other Ambulatory Visit: Payer: Self-pay | Admitting: *Deleted

## 2015-06-29 DIAGNOSIS — I10 Essential (primary) hypertension: Secondary | ICD-10-CM

## 2015-06-29 MED ORDER — LOSARTAN POTASSIUM 50 MG PO TABS: ORAL_TABLET | ORAL | Status: DC

## 2015-06-29 MED ORDER — METOPROLOL TARTRATE 25 MG PO TABS
12.5000 mg | ORAL_TABLET | Freq: Two times a day (BID) | ORAL | Status: DC
Start: 2015-06-29 — End: 2015-11-24

## 2015-06-29 NOTE — Telephone Encounter (Signed)
Patient called regarding BP medication not having any refills, so they will not fill the medication for him. I informed him that I would send in medication over the computer right away.

## 2015-06-30 ENCOUNTER — Ambulatory Visit: Payer: Medicare HMO | Admitting: Cardiothoracic Surgery

## 2015-07-01 ENCOUNTER — Ambulatory Visit
Admission: RE | Admit: 2015-07-01 | Discharge: 2015-07-01 | Disposition: A | Discharge status: Home / Self Care | Payer: Medicare HMO | Source: Ambulatory Visit | Attending: Cardiothoracic Surgery | Admitting: Cardiothoracic Surgery

## 2015-07-01 ENCOUNTER — Encounter: Payer: Self-pay | Admitting: Cardiothoracic Surgery

## 2015-07-01 ENCOUNTER — Ambulatory Visit (INDEPENDENT_AMBULATORY_CARE_PROVIDER_SITE_OTHER): Payer: Medicare HMO | Admitting: Cardiothoracic Surgery

## 2015-07-01 VITALS — BP 126/58 | HR 68 | Resp 20 | Ht 67.0 in | Wt 216.0 lb

## 2015-07-01 DIAGNOSIS — C3432 Malignant neoplasm of lower lobe, left bronchus or lung: Secondary | ICD-10-CM

## 2015-07-01 DIAGNOSIS — C3490 Malignant neoplasm of unspecified part of unspecified bronchus or lung: Secondary | ICD-10-CM

## 2015-07-01 DIAGNOSIS — Z902 Acquired absence of lung [part of]: Secondary | ICD-10-CM

## 2015-07-01 NOTE — Progress Notes (Signed)
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 PROCEDURE: Left lower lobectomy and lymph node dissection, video bronchoscopy, left video-assisted thoracoscopy, and placement of On-Q. 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 He continues to have some paresthesias over the left anterior chest. His main complaint is sided pain in his left leg and chronic back pain he's had a recent MRI of the spine and is being evaluated for spine surgery versus stimulator placement.  Past Medical History  Diagnosis Date  . Acute appendicitis with peritoneal abscess 01/28/2013  . Ileus, postoperative 01/28/2013  . Tobacco use disorder 01/28/2013  . Obesity, morbid ([place]) 01/28/2013  . Shortness of breath   . Essential hypertension 04/16/2014  . Elevated PSA   . Bacteremia September 2015  . A-fib ([place])   . Lung cancer ([place])   . Arthritis      History  Smoking status  . Former Smoker -- 0.00 packs/day for 50 years  . Types: Cigars, Cigarettes  . Start date: 07/16/1958  Smokeless tobacco  . Never Used    Comment: Peak use 2ppd of cigarettes- as of 04-20-15 has cut down to 5 cigarettes/day    History  Alcohol Use No     Allergies    Allergen Reactions  . Asa [Aspirin] Anaphylaxis and Swelling    Angioedema/Eyes and lip swelling  . Nsaids Hives  . Naproxen Hives    Current Outpatient Prescriptions  Medication Sig Dispense Refill  . hydrALAZINE (APRESOLINE) 25 MG tablet Take one tablet by mouth three times daily for blood pressure 90 tablet 11  . losartan (COZAAR) 50 MG tablet TAKE 1 TABLET BY MOUTH EVERY DAY FOR BLOOD PRESSURE 30 tablet 4  . methocarbamol (ROBAXIN) 500 MG tablet Take 1 tablet (500 mg total) by mouth every 6 (six) hours as needed for muscle spasms. 30 tablet 0  . metoprolol tartrate (LOPRESSOR) 25 MG tablet Take 0.5 tablets (12.5 mg total) by mouth 2 (two) times daily. 30 tablet 4  . traMADol (ULTRAM) 50 MG tablet 1-2 tablets every 6 hours as needed for pain- max 6 tablets in 24 hours 30 tablet 0   No current facility-administered medications for this visit.       Physical Exam: BP 126/58 mmHg  Pulse 68  Resp 20  Ht '5\' 7"'$  (1.702 m)  Wt 216 lb (97.977 kg)  BMI 33.82 kg/m2  SpO2 95%  General appearance: alert, cooperative and no distress Neurologic: intact Heart: regular rate and rhythm, S1, S2 normal, no murmur, click, rub or gallop Lungs: clear to auscultation bilaterally Abdomen: soft, non-tender; bowel sounds normal; no masses,  no organomegaly Extremities: extremities normal, atraumatic,  no cyanosis or edema and Homans sign is negative, no sign of DVT Wound: Left port sites chest tube sites and incision are all healing well   Diagnostic Studies & Laboratory data:     Recent Radiology Findings:   Dg Chest 2 View  07/01/2015  CLINICAL DATA:  Shortness of breath.  Prior surgery. EXAM: CHEST  2 VIEW COMPARISON:  05/24/2015. FINDINGS: Mediastinum hilar structures are normal. Left base subsegmental atelectasis and/or scarring. Small left pleural effusion, decreased from prior exam. No change from prior exam. Stable cardiomegaly. No acute bony abnormality. IMPRESSION: Stable left base  subsegmental atelectasis and/or scarring. Left pleural effusion, decreased from prior exam . Electronically Signed   By: Marcello Moores  Register   On: 07/01/2015 11:45      Recent Lab Findings: Lab Results  Component Value Date   WBC 9.2 06/02/2015   HGB 15.3 06/02/2015   HCT 47.0 06/02/2015   PLT 256 06/02/2015   GLUCOSE 183* 06/02/2015   CHOL 159 01/28/2015   TRIG 160* 01/28/2015   HDL 41 01/28/2015   LDLCALC 86 01/28/2015   ALT 33 06/02/2015   AST 28 06/02/2015   NA 141 06/02/2015   K 4.0 06/02/2015   CL 105 06/02/2015   CREATININE 1.03 06/02/2015   BUN 24* 06/02/2015   CO2 24 06/02/2015   TSH 0.194* 04/27/2015   INR 1.00 04/20/2015   HGBA1C 5.3 07/27/2014      Assessment / Plan:    Patient doing well initially postop now about 3 months since left lower lobectomy for a 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 I'll plan to see him back in 3 months with a follow-up CT scan of the chest which will be approximate 6 months postop     Grace Isaac MD      [address] [place] [address] Office [phone]   Beeper [phone]  07/01/2015 12:23 PM

## 2015-07-04 ENCOUNTER — Ambulatory Visit: Payer: Medicare HMO | Admitting: Infectious Diseases

## 2015-07-24 IMAGING — CR DG CHEST 1V PORT
1 series · 1 of 1 positions shown · non-contrast
Comparison: 04/01/2014

CLINICAL DATA: Increased work of breathing, shortness of breath,
history smoking

EXAM:
PORTABLE CHEST - 1 VIEW

[AP]
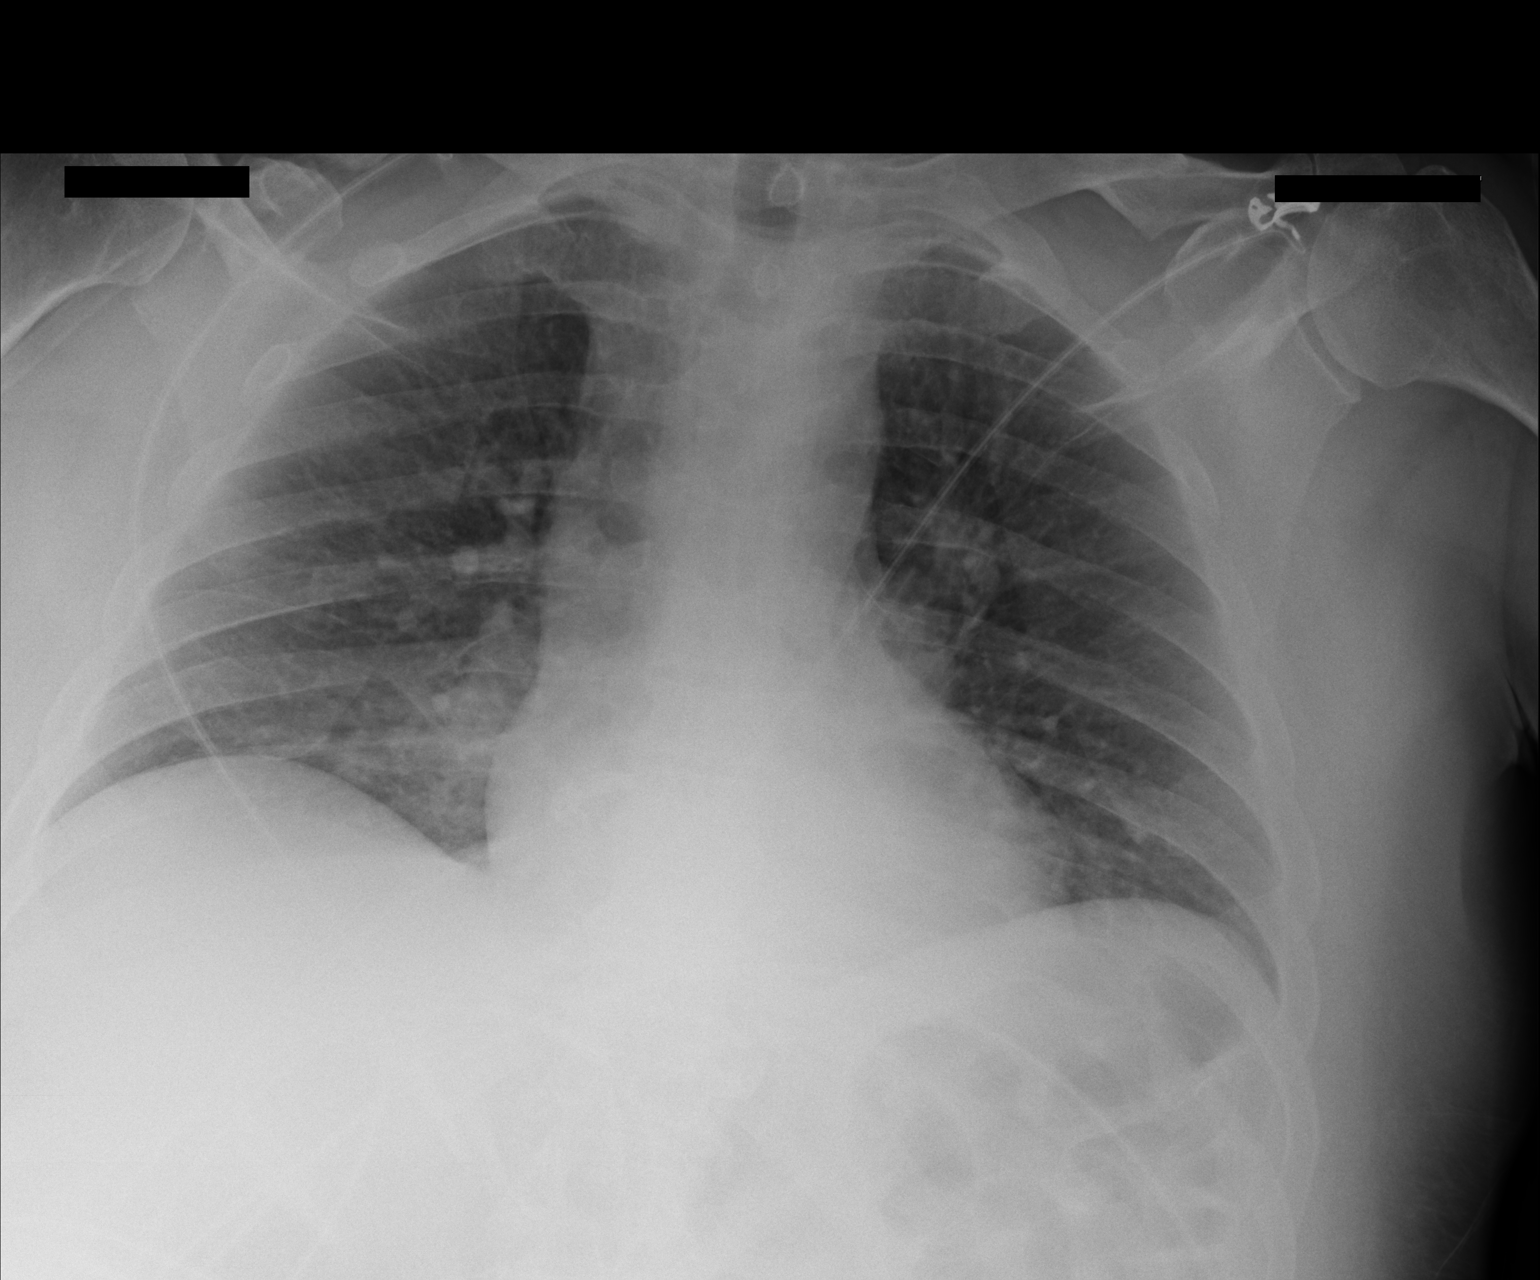

[1 of 1 positions shown; findings below may reference images not displayed]

FINDINGS: Normal heart size, mediastinal contours, and pulmonary vascularity.

Atelectasis versus infiltrate at medial RIGHT lower lobe.

Remaining lungs clear.

No pleural effusion or pneumothorax.

Bones unremarkable.
IMPRESSION: Atelectasis versus infiltrate at medial RIGHT lower lobe.

## 2015-08-05 ENCOUNTER — Other Ambulatory Visit: Payer: Medicare HMO

## 2015-08-05 DIAGNOSIS — R972 Elevated prostate specific antigen [PSA]: Secondary | ICD-10-CM

## 2015-08-06 LAB — PSA: Prostate Specific Ag, Serum: 3.4 ng/mL (ref 0.0–4.0)

## 2015-08-09 ENCOUNTER — Ambulatory Visit (INDEPENDENT_AMBULATORY_CARE_PROVIDER_SITE_OTHER): Payer: Medicare HMO | Admitting: Nurse Practitioner

## 2015-08-09 ENCOUNTER — Encounter: Payer: Self-pay | Admitting: Nurse Practitioner

## 2015-08-09 ENCOUNTER — Ambulatory Visit: Payer: Medicare HMO | Admitting: Nurse Practitioner

## 2015-08-09 VITALS — BP 122/56 | HR 66 | Temp 98.4°F | Resp 20 | Ht 67.0 in | Wt 227.8 lb

## 2015-08-09 DIAGNOSIS — I480 Paroxysmal atrial fibrillation: Secondary | ICD-10-CM | POA: Diagnosis not present

## 2015-08-09 DIAGNOSIS — R972 Elevated prostate specific antigen [PSA]: Secondary | ICD-10-CM | POA: Diagnosis not present

## 2015-08-09 DIAGNOSIS — Z720 Tobacco use: Secondary | ICD-10-CM | POA: Diagnosis not present

## 2015-08-09 DIAGNOSIS — M5442 Lumbago with sciatica, left side: Secondary | ICD-10-CM | POA: Diagnosis not present

## 2015-08-09 DIAGNOSIS — I10 Essential (primary) hypertension: Secondary | ICD-10-CM | POA: Diagnosis not present

## 2015-08-09 DIAGNOSIS — E781 Pure hyperglyceridemia: Secondary | ICD-10-CM

## 2015-08-09 NOTE — Progress Notes (Signed)
Patient ID: [name], male   DOB: 28-May-1941, 75 y.o.   MRN: [id number]    PCP: [name], NP  Advanced Directive information Does patient have an advance directive?: No, Would patient like information on creating an advanced directive?: Yes - Educational materials given  Allergies  Allergen Reactions  . Asa [Aspirin] Anaphylaxis and Swelling    Angioedema/Eyes and lip swelling  . Nsaids Hives  . Naproxen Hives    Chief Complaint  Patient presents with  . Medical Management of Chronic Issues    6 month follow-up for Hypertension, Afib, Labs printed     HPI: Patient is a 75 y.o. male seen in the office today for routine follow up on chronic conditions. Pt with a hx of htn, elevated PSA, lung cancer s/p lobectomy, disc disease.  Pt has been referred to neurosurgery due to worsening back pain. He has been to multiple places and appt and providers. Had MRI. Offered surgery or spinal cord stimulation or nerve block. Planning to get nerve block, working on this appt now. Unable to sleep in the bed due to the pain. Sleeping in the recliner.   Ongoing follow up with cardiothoracic surgeon.   Taking losartan, metoprolol and hydralazine   Smoked during christmas, has a bad urge but has only smoked 5 cigarettes since that. Review of Systems:  Review of Systems  Constitutional: Negative for activity change and fatigue.  Eyes: Negative.   Respiratory: Negative for shortness of breath.   Cardiovascular: Negative for chest pain and leg swelling.  Gastrointestinal: Negative.   Genitourinary: Negative for dysuria, frequency and difficulty urinating.  Musculoskeletal: Positive for back pain and arthralgias. Negative for myalgias, joint swelling, gait problem and neck stiffness.  Neurological: Positive for numbness (left leg).    Past Medical History  Diagnosis Date  . Acute appendicitis with peritoneal abscess 01/28/2013  . Ileus, postoperative 01/28/2013  . Tobacco use  disorder 01/28/2013  . Obesity, morbid ([place]) 01/28/2013  . Shortness of breath   . Essential hypertension 04/16/2014  . Elevated PSA   . Bacteremia September 2015  . A-fib ([place])   . Lung cancer ([place])   . Arthritis    Past Surgical History  Procedure Laterality Date  . Back surgery  1997, 2003    Dr.Apleton   . Laparoscopic appendectomy N/A 01/25/2013    Procedure: APPENDECTOMY LAPAROSCOPIC;  Surgeon: Ralene Ok, MD;  Location: [facility];  Service: General;  Laterality: N/A;  . Tee without cardioversion N/A 04/07/2014    Procedure: TRANSESOPHAGEAL ECHOCARDIOGRAM (TEE);  Surgeon: Candee Furbish, MD;  Location: [facility];  Service: Cardiovascular;  Laterality: N/A;  . Shoulder surgery Right 2014    Dr.Whitfield rotator cuff surgery  . Knee surgery Bilateral   . Appendectomy    . Carpal tunnel release    . Colonoscopy      x 3  . Eye surgery Left     muscle of the eye   . Video bronchoscopy N/A 04/26/2015    Procedure: VIDEO BRONCHOSCOPY;  Surgeon: Grace Isaac, MD;  Location: [facility] OR;  Service: Thoracic;  Laterality: N/A;  . Video assisted thoracoscopy (vats)/wedge resection Left 04/26/2015    Procedure: LEFT VIDEO ASSISTED THORACOSCOPY (VATS) WITH LEFT LOWER LOBECTOMY;  Surgeon: Grace Isaac, MD;  Location: [facility];  Service: Thoracic;  Laterality: Left;  . Lymph node dissection Left 04/26/2015    Procedure: LYMPH NODE DISSECTION;  Surgeon: Grace Isaac, MD;  Location: [facility];  Service: Thoracic;  Laterality: Left;   Social History:   reports that he has quit smoking. His smoking use included Cigars and Cigarettes. He started smoking about 57 years ago. He smoked 0.00 packs per day for 50 years. He has never used smokeless tobacco. He reports that he does not drink alcohol or use illicit drugs.  Family History  Problem Relation Age of Onset  . Irritable bowel syndrome Mother   . Cancer Neg Hx   . Lung disease Neg Hx     Medications: Patient's Medications  New  Prescriptions   No medications on file  Previous Medications   HYDRALAZINE (APRESOLINE) 25 MG TABLET    Take one tablet by mouth three times daily for blood pressure   LOSARTAN (COZAAR) 50 MG TABLET    TAKE 1 TABLET BY MOUTH EVERY DAY FOR BLOOD PRESSURE   METHOCARBAMOL (ROBAXIN) 500 MG TABLET    Take 1 tablet (500 mg total) by mouth every 6 (six) hours as needed for muscle spasms.   METOPROLOL TARTRATE (LOPRESSOR) 25 MG TABLET    Take 0.5 tablets (12.5 mg total) by mouth 2 (two) times daily.   TRAMADOL (ULTRAM) 50 MG TABLET    1-2 tablets every 6 hours as needed for pain- max 6 tablets in 24 hours  Modified Medications   No medications on file  Discontinued Medications   No medications on file     Physical Exam:  Filed Vitals:   08/09/15 1006  BP: 122/56  Pulse: 66  Temp: 98.4 F (36.9 C)  TempSrc: Oral  Resp: 20  Height: '5\' 7"'$  (1.702 m)  Weight: 227 lb 12.8 oz (103.329 kg)  SpO2: 96%   Body mass index is 35.67 kg/(m^2).  Physical Exam  Constitutional: He is oriented to person, place, and time. He appears well-developed and well-nourished.  HENT:  Mouth/Throat: Oropharynx is clear and moist.  Eyes: Conjunctivae are normal. Pupils are equal, round, and reactive to light.  Neck: Normal range of motion. Neck supple.  Cardiovascular: Normal rate, regular rhythm and normal heart sounds.   Pulmonary/Chest: Effort normal and breath sounds normal.  Abdominal: Soft. Bowel sounds are normal. He exhibits no distension.  Musculoskeletal: Normal range of motion. He exhibits no edema or tenderness.  Neurological: He is alert and oriented to person, place, and time.  Skin: Skin is warm and dry.    Labs reviewed: Basic Metabolic Panel:  Recent Labs  04/27/15 1810 04/27/15 1823 04/28/15 0351 06/02/15 2042  NA 137  --  138 141  K 4.3  --  4.1 4.0  CL 104  --  101 105  CO2 26  --  27 24  GLUCOSE 109*  --  123* 183*  BUN 15  --  13 24*  CREATININE 0.96  --  0.90 1.03  CALCIUM  8.5*  --  8.7* 9.3  MG 2.5*  --   --   --   TSH  --  0.194*  --   --    Liver Function Tests:  Recent Labs  04/20/15 1504 04/28/15 0351 06/02/15 2042  AST '26 26 28  '$ ALT 24 23 33  ALKPHOS 74 55 89  BILITOT 0.6 0.5 0.5  PROT 7.1 6.4* 6.9  ALBUMIN 4.1 3.1* 3.9   No results for input(s): LIPASE, AMYLASE in the last 8760 hours. No results for input(s): AMMONIA in the last 8760 hours. CBC:  Recent Labs  01/28/15 0828  04/27/15 1810 04/28/15 0351 06/02/15 2042  WBC 7.4  < > 12.4*  11.6* 9.2  NEUTROABS 5.2  --   --   --   --   HGB  --   < > 14.6 14.6 15.3  HCT 50.3  < > 44.2 44.6 47.0  MCV 94  < > 99.1 98.7 97.9  PLT 233  < > 169 169 256  < > = values in this interval not displayed. Lipid Panel:  Recent Labs  01/28/15 0828  CHOL 159  HDL 41  LDLCALC 86  TRIG 160*  CHOLHDL 3.9   TSH:  Recent Labs  04/27/15 1823  TSH 0.194*   A1C: Lab Results  Component Value Date   HGBA1C 5.3 07/27/2014     Assessment/Plan 1. Left-sided low back pain with left-sided sciatica Ongoing issue, conts on ultram and robaxin -following with neurosurgeon who recommended nerve block, waiting on this to be scheduled   2. Paroxysmal atrial fibrillation (HCC) Remains in Sinus, following with cardiologist   conts on metoprolol  - CBC with Differential; Future - TSH; Future  3. Tobacco abuse Has smoked a few cigarettes in the last month, recommended not occasionally smoking  -discussed distraction and other things to do when he feels the urge to smoking  4. Essential hypertension Well controlled on current regimen, to cont current medication - Comprehensive metabolic panel; Future  5. Elevated PSA Improved on recent labs, will follow - PSA; Future  6. Hypertriglyceridemia -pt reports dietary changes, to cont heart healthy diet, follow up lab work - Advertising account executive; Future - Lipid panel; Future  7. Need for colon cancer screening -pt does not wish for  another colonoscopy but willing to do cologuard. cologuard orders submitted   Mileena Rothenberger K. Harle Battiest  [facility] [phone] 8 am - 5 pm) [phone] (after hours)

## 2015-08-09 NOTE — Patient Instructions (Signed)
   Follow up in 6 months for physical with fasting lab work prior to visit

## 2015-08-15 ENCOUNTER — Ambulatory Visit (INDEPENDENT_AMBULATORY_CARE_PROVIDER_SITE_OTHER): Payer: Medicare HMO | Admitting: Cardiology

## 2015-08-15 ENCOUNTER — Encounter: Payer: Self-pay | Admitting: Cardiology

## 2015-08-15 VITALS — BP 130/68 | HR 64 | Ht 67.0 in | Wt 225.2 lb

## 2015-08-15 DIAGNOSIS — C3430 Malignant neoplasm of lower lobe, unspecified bronchus or lung: Secondary | ICD-10-CM | POA: Diagnosis not present

## 2015-08-15 DIAGNOSIS — I480 Paroxysmal atrial fibrillation: Secondary | ICD-10-CM | POA: Diagnosis not present

## 2015-08-15 DIAGNOSIS — F17200 Nicotine dependence, unspecified, uncomplicated: Secondary | ICD-10-CM | POA: Diagnosis not present

## 2015-08-15 DIAGNOSIS — Z720 Tobacco use: Secondary | ICD-10-CM | POA: Diagnosis not present

## 2015-08-15 LAB — COLOGUARD: Cologuard: POSITIVE

## 2015-08-15 NOTE — Patient Instructions (Signed)
Medication Instructions:   NO CHANGES Labwork:  NONE Testing/Procedures:  NONE Follow-Up: Your physician recommends that you schedule a follow-up appointment in: AS NEEDED  Any Other Special Instructions Will Be Listed Below (If Applicable).     If you need a refill on your cardiac medications before your next appointment, please call your pharmacy.

## 2015-08-15 NOTE — Progress Notes (Signed)
[facility]. [address] [place], [place]  [address] Phone: [phone] Fax:  [phone]  Date:  08/15/2015   ID:  [name], DOB September 11, 1940, MRN [id number]  PCP:  [name], NP   History of Present Illness: [name] is a 75 y.o. male hospitalization in September 2015, atrial fibrillation with rapid ventricular response in the setting of infection who converted to normal sinus rhythm on amiodarone, diltiazem, previously chronic anticoagulation with Eliquis, normal echocardiogram with bacteremia, transesophageal echocardiogram showing no vegetations, reassuring.   He had another hospitalization for VATS, lung cancer and had postoperative atrial fibrillation. Took amiodarone, immediately converted. Maintaining Lopressor 12.5 g twice a day. Aspirin was once again tried but he developed sss.  MRI of back septic joints.  It is certainly plausible that his atrial fibrillation had an underlying reversible cause which was his severe infectious state in the hospital/postoperative state.  Lost 8 pounds on Nutrisystem. Prior back pain. No further palps. Nerve block on back coming up.  For back pain. He unfortunately started smoking again in late December. We discussed at length.   Wt Readings from Last 3 Encounters:  08/15/15 225 lb 3.2 oz (102.15 kg)  08/09/15 227 lb 12.8 oz (103.329 kg)  07/01/15 216 lb (97.977 kg)     Past Medical History  Diagnosis Date  . Acute appendicitis with peritoneal abscess 01/28/2013  . Ileus, postoperative 01/28/2013  . Tobacco use disorder 01/28/2013  . Obesity, morbid ([place]) 01/28/2013  . Shortness of breath   . Essential hypertension 04/16/2014  . Elevated PSA   . Bacteremia September 2015  . A-fib ([place])   . Lung cancer ([place])   . Arthritis     Past Surgical History  Procedure Laterality Date  . Back surgery  1997, 2003    Dr.Apleton   . Laparoscopic appendectomy N/A 01/25/2013    Procedure: APPENDECTOMY LAPAROSCOPIC;   Surgeon: Ralene Ok, MD;  Location: [facility];  Service: General;  Laterality: N/A;  . Tee without cardioversion N/A 04/07/2014    Procedure: TRANSESOPHAGEAL ECHOCARDIOGRAM (TEE);  Surgeon: Candee Furbish, MD;  Location: [facility];  Service: Cardiovascular;  Laterality: N/A;  . Shoulder surgery Right 2014    Dr.Whitfield rotator cuff surgery  . Knee surgery Bilateral   . Appendectomy    . Carpal tunnel release    . Colonoscopy      x 3  . Eye surgery Left     muscle of the eye   . Video bronchoscopy N/A 04/26/2015    Procedure: VIDEO BRONCHOSCOPY;  Surgeon: Grace Isaac, MD;  [facility];  Service: Thoracic;  Laterality: N/A;  . Video assisted thoracoscopy (vats)/wedge resection Left 04/26/2015    Procedure: LEFT VIDEO ASSISTED THORACOSCOPY (VATS) WITH LEFT LOWER LOBECTOMY;  Surgeon: Grace Isaac, MD;  Location: [facility];  Service: Thoracic;  Laterality: Left;  . Lymph node dissection Left 04/26/2015    Procedure: LYMPH NODE DISSECTION;  Surgeon: Grace Isaac, MD;  Location: [facility];  Service: Thoracic;  Laterality: Left;    Current Outpatient Prescriptions  Medication Sig Dispense Refill  . hydrALAZINE (APRESOLINE) 25 MG tablet Take one tablet by mouth three times daily for blood pressure 90 tablet 11  . losartan (COZAAR) 50 MG tablet TAKE 1 TABLET BY MOUTH EVERY DAY FOR BLOOD PRESSURE 30 tablet 4  . methocarbamol (ROBAXIN) 500 MG tablet Take 1 tablet (500 mg total) by mouth every 6 (six) hours as needed for  muscle spasms. 30 tablet 0  . metoprolol tartrate (LOPRESSOR) 25 MG tablet Take 0.5 tablets (12.5 mg total) by mouth 2 (two) times daily. 30 tablet 4  . traMADol (ULTRAM) 50 MG tablet 1-2 tablets every 6 hours as needed for pain- max 6 tablets in 24 hours 30 tablet 0   No current facility-administered medications for this visit.    Allergies:    Allergies  Allergen Reactions  . Asa [Aspirin] Anaphylaxis and Swelling    Angioedema/Eyes and lip swelling  . Nsaids  Hives  . Naproxen Hives    Social History:  The patient  reports that he has quit smoking. His smoking use included Cigars and Cigarettes. He started smoking about 57 years ago. He smoked 0.00 packs per day for 50 years. He has never used smokeless tobacco. He reports that he does not drink alcohol or use illicit drugs.   No early family history of coronary artery disease  ROS:  Please see the history of present illness.   Denies any syncope, bleeding, orthopnea, PND   All other systems reviewed and negative.   PHYSICAL EXAM: VS:  BP 130/68 mmHg  Pulse 64  Ht '5\' 7"'$  (1.702 m)  Wt 225 lb 3.2 oz (102.15 kg)  BMI 35.26 kg/m2 Well nourished, well developed, in no acute distress HEENT: normal, [place]/AT, EOMI Neck: no JVD, normal carotid upstroke, no bruit Cardiac:  normal S1, S2; RRR; no murmur Lungs:  clear to auscultation bilaterally, no wheezing, rhonchi or rales Abd: soft, nontender, no hepatomegaly, no bruitsoverweight Ext: trace edema, 2+ distal pulses Skin: warm and dry GU: deferred Neuro: no focal abnormalities noted, AAO x 3  EKG: 11/29/14-sinus rhythm, 75, vertical axis ,borderline interventricular conduction delay. 08/12/14-normal rhythm, left axis deviation, vertical axis, poor R-wave progression. QTC 473 ms.    Labs: 07/27/14-liver functions normal, creatinine 0.9, hemoglobin 17.6. TSH from 04/01/14 was 0.9.  ECHO: 55% on TEE.   ASSESSMENT AND PLAN:  1. Paroxysmal atrial fibrillation- no further occurrence. There was reversible cause previously. We will see him back on as-needed basis at this point. Converted on amiodarone during hospitalization in September 2015 in the setting of bacteremia as well as during hospitalization for vats October 2016, had left lower lobectomy.  In both situations, he had a reversible cause for his atrial fibrillation i.e. severe infection/surgery. Since he is demonstrated normal sinus rhythm since conversion, we have had discussion previously about  discontinuation of both amiodarone as well as Eliquis. It is not unreasonable. We will go ahead and discontinue. I will continue with the Lopressor 12.5 g twice a day however. Of course, there is always a risk that he would return in to atrial fibrillation unmonitored and potential stroke risk is present. Lab work reassuring. He will let me know if he feels any symptoms of atrial fibrillation, palpitations.  2. Chronic anticoagulation- discontinued Eliquis in the past. Used to take ASA for years but now is allergic.  Discontinued aspirin, did cause angioedema at home. 3. Osteomyelitis, resolved-Dr. Algis Downs note reviewed. Antibiotics discontinued . Resolved. Prior groin abscess however. 4. Tobacco use - urge to smoke after eating.  Cessation  Discussed at length.  5. Obesity-continue to encourage weight loss. Risk factor for developing atrial fibrillation once again.  Nutrisystem 6. Essential hypertension-normal. Well controlled 7. PRN month follow-up.  Signed, Candee Furbish, MD [facility]  08/15/2015 9:07 AM

## 2015-08-29 ENCOUNTER — Encounter: Payer: Self-pay | Admitting: *Deleted

## 2015-09-02 ENCOUNTER — Telehealth: Payer: Self-pay

## 2015-09-02 DIAGNOSIS — R195 Other fecal abnormalities: Secondary | ICD-10-CM

## 2015-09-02 NOTE — Telephone Encounter (Signed)
Spoke with patient about the Cologuard results indicate the presence of colorectal cancer or advanced adenoma. Per Sherrie Mustache, NP she wants to refer him to GI. He saw Dr. Owens Loffler 2/16, we will put in a referral. He was ok with this.

## 2015-09-06 ENCOUNTER — Encounter: Payer: Self-pay | Admitting: Gastroenterology

## 2015-09-14 ENCOUNTER — Other Ambulatory Visit: Payer: Self-pay | Admitting: Cardiothoracic Surgery

## 2015-09-14 DIAGNOSIS — C3430 Malignant neoplasm of lower lobe, unspecified bronchus or lung: Secondary | ICD-10-CM

## 2015-09-26 ENCOUNTER — Ambulatory Visit
Admission: RE | Admit: 2015-09-26 | Discharge: 2015-09-26 | Disposition: A | Discharge status: Home / Self Care | Payer: Medicare HMO | Source: Ambulatory Visit | Attending: Cardiothoracic Surgery | Admitting: Cardiothoracic Surgery

## 2015-09-26 ENCOUNTER — Ambulatory Visit (INDEPENDENT_AMBULATORY_CARE_PROVIDER_SITE_OTHER): Payer: Medicare HMO | Admitting: Cardiothoracic Surgery

## 2015-09-26 ENCOUNTER — Encounter: Payer: Self-pay | Admitting: Cardiothoracic Surgery

## 2015-09-26 VITALS — BP 127/62 | HR 68 | Resp 20 | Ht 67.0 in | Wt 215.0 lb

## 2015-09-26 DIAGNOSIS — Z902 Acquired absence of lung [part of]: Secondary | ICD-10-CM | POA: Diagnosis not present

## 2015-09-26 DIAGNOSIS — C3430 Malignant neoplasm of lower lobe, unspecified bronchus or lung: Secondary | ICD-10-CM

## 2015-09-26 NOTE — Progress Notes (Signed)
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 PROCEDURE: Left lower lobectomy and lymph node dissection, video bronchoscopy, left video-assisted thoracoscopy, and placement of On-Q. 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 He continues to have some paresthesias over the left anterior chest.   Past Medical History  Diagnosis Date  . Acute appendicitis with peritoneal abscess 01/28/2013  . Ileus, postoperative 01/28/2013  . Tobacco use disorder 01/28/2013  . Obesity, morbid ([place]) 01/28/2013  . Shortness of breath   . Essential hypertension 04/16/2014  . Elevated PSA   . Bacteremia September 2015  . A-fib ([place])   . Lung cancer ([place])   . Arthritis      History  Smoking status  . Former Smoker -- 0.00 packs/day for 50 years  . Types: Cigars, Cigarettes  . Start date: 07/16/1958  Smokeless tobacco  . Never Used    Comment: Peak use 2ppd of cigarettes- as of 04-20-15 has cut down to 5 cigarettes/day    History  Alcohol Use No     Allergies  Allergen Reactions  . Asa [Aspirin] Anaphylaxis and Swelling    Angioedema/Eyes and lip swelling  . Nsaids Hives  . Naproxen Hives    Current Outpatient Prescriptions  Medication  Sig Dispense Refill  . hydrALAZINE (APRESOLINE) 25 MG tablet Take one tablet by mouth three times daily for blood pressure 90 tablet 11  . losartan (COZAAR) 50 MG tablet TAKE 1 TABLET BY MOUTH EVERY DAY FOR BLOOD PRESSURE 30 tablet 4  . metoprolol tartrate (LOPRESSOR) 25 MG tablet Take 0.5 tablets (12.5 mg total) by mouth 2 (two) times daily. 30 tablet 4   No current facility-administered medications for this visit.       Physical Exam: BP 127/62 mmHg  Pulse 68  Resp 20  Ht '5\' 7"'$  (1.702 m)  Wt 215 lb (97.523 kg)  BMI 33.67 kg/m2  SpO2 97%  General appearance: alert, cooperative and no distress Neurologic: intact Heart: regular rate and rhythm, S1, S2 normal, no murmur, click, rub or gallop Lungs: clear to auscultation bilaterally Abdomen: soft, non-tender; bowel sounds normal; no masses,  no organomegaly Extremities: extremities normal, atraumatic, no cyanosis or edema and Homans sign is negative, no sign of DVT Wound: Left port sites chest tube sites and incision are all healing well   Diagnostic Studies & Laboratory data:     Recent Radiology Findings:   Ct Chest Wo Contrast  09/26/2015  CLINICAL DATA:  Left lower lobe lung cancer and resection. Weight loss. EXAM: CT CHEST WITHOUT CONTRAST TECHNIQUE: Multidetector CT imaging of the chest was performed following the standard protocol without IV contrast. COMPARISON:  PET 03/07/2015 and CT chest 08/05/2014. FINDINGS: Mediastinum/Nodes: Mediastinal lymph nodes are not enlarged by CT size criteria. Hilar regions are difficult to definitively evaluate without IV contrast. No axillary adenopathy. Atherosclerotic calcification of the arterial vasculature, including three-vessel involvement of the coronary arteries. Heart size normal. No pericardial effusion. Lungs/Pleura: Postoperative changes of left lower lobectomy with a small left pleural effusion. Minimal linear volume loss in the left upper lobe. Calcified granuloma in the lingula.  4 mm right upper lobe nodule (4/19), unchanged. Airway is otherwise unremarkable. Upper abdomen: Low-attenuation lesion in the dome of the liver measures 1.8 cm, as before. Visualized portions of the liver, gallbladder, adrenal glands, kidneys, spleen, pancreas, stomach and bowel are otherwise grossly unremarkable. No upper abdominal adenopathy. Musculoskeletal: No worrisome lytic or sclerotic lesions. Degenerative changes are seen in the spine. Thoracotomy changes on the left. Probable subcutaneous sebaceous cysts overlying the right scapula and posterior right ribs. IMPRESSION: 1. Postoperative changes of left lower lobectomy without evidence of recurrent or metastatic disease. 2. Small left pleural effusion. 3. Three-vessel coronary artery calcification. Electronically Signed   By: Lorin Picket M.D.   On: 09/26/2015 14:18      Recent Lab Findings: Lab Results  Component Value Date   WBC 9.2 06/02/2015   HGB 15.3 06/02/2015   HCT 47.0 06/02/2015   PLT 256 06/02/2015   GLUCOSE 183* 06/02/2015   CHOL 159 01/28/2015   TRIG 160* 01/28/2015   HDL 41 01/28/2015   LDLCALC 86 01/28/2015   ALT 33 06/02/2015   AST 28 06/02/2015   NA 141 06/02/2015   K 4.0 06/02/2015   CL 105 06/02/2015   CREATININE 1.03 06/02/2015   BUN 24* 06/02/2015   CO2 24 06/02/2015   TSH 0.194* 04/27/2015   INR 1.00 04/20/2015   HGBA1C 5.3 07/27/2014      Assessment / Plan:    Patient doing well  postop now about 6 months since left lower lobectomy for a 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 I'll plan to see him back in 6 months with a follow-up CT scan of the chest   Grace Isaac MD      [address] [place] [address] Office [phone]   [facility]eeper [phone]  09/26/2015 4:40 PM

## 2015-09-28 ENCOUNTER — Other Ambulatory Visit: Payer: Self-pay | Admitting: Nurse Practitioner

## 2015-10-05 ENCOUNTER — Ambulatory Visit (AMBULATORY_SURGERY_CENTER): Payer: Self-pay | Admitting: *Deleted

## 2015-10-05 VITALS — Ht 66.0 in | Wt 218.0 lb

## 2015-10-05 DIAGNOSIS — Z1211 Encounter for screening for malignant neoplasm of colon: Secondary | ICD-10-CM

## 2015-10-05 MED ORDER — NA SULFATE-K SULFATE-MG SULF 17.5-3.13-1.6 GM/177ML PO SOLN: 1.0000 | Freq: Once | ORAL | Status: DC

## 2015-10-05 NOTE — Progress Notes (Signed)
No egg or soy allergy. No anesthesia problems.  No home O2.  No diet meds.  Emmi given.

## 2015-10-19 ENCOUNTER — Ambulatory Visit (AMBULATORY_SURGERY_CENTER): Payer: Medicare HMO | Admitting: Gastroenterology

## 2015-10-19 ENCOUNTER — Encounter: Payer: Self-pay | Admitting: Gastroenterology

## 2015-10-19 VITALS — BP 101/56 | HR 57 | Temp 97.5°F | Resp 12 | Ht 66.0 in | Wt 218.0 lb

## 2015-10-19 DIAGNOSIS — D129 Benign neoplasm of anus and anal canal: Secondary | ICD-10-CM

## 2015-10-19 DIAGNOSIS — Z1211 Encounter for screening for malignant neoplasm of colon: Secondary | ICD-10-CM

## 2015-10-19 DIAGNOSIS — D128 Benign neoplasm of rectum: Secondary | ICD-10-CM | POA: Diagnosis not present

## 2015-10-19 MED ORDER — SODIUM CHLORIDE 0.9 % IV SOLN: 500.0000 mL | INTRAVENOUS | Status: DC

## 2015-10-19 NOTE — Op Note (Signed)
[facility] Patient Name: [name] Procedure Date: 10/19/2015 8:59 AM MRN: [id number] Endoscopist: Milus Banister , MD Age: 75 Referring MD:  Date of Birth: 1941/02/16 Gender: Male Procedure:                Colonoscopy Indications:              Screening for colorectal malignant neoplasm Medicines:                Monitored Anesthesia Care Procedure:                Pre-Anesthesia Assessment:                           - Prior to the procedure, a History and Physical                            was performed, and patient medications and                            allergies were reviewed. The patient's tolerance of                            previous anesthesia was also reviewed. The risks                            and benefits of the procedure and the sedation                            options and risks were discussed with the patient.                            All questions were answered, and informed consent                            was obtained. Prior Anticoagulants: The patient has                            taken no previous anticoagulant or antiplatelet                            agents. ASA Grade Assessment: II - A patient with                            mild systemic disease. After reviewing the risks                            and benefits, the patient was deemed in                            satisfactory condition to undergo the procedure.                           After obtaining informed consent, the colonoscope  was passed under direct vision. Throughout the                            procedure, the patient's blood pressure, pulse, and                            oxygen saturations were monitored continuously. The                            Model CF-HQ190L [id number]) scope was introduced                            through the anus and advanced to the the cecum,                            identified by appendiceal orifice and  ileocecal                            valve. The colonoscopy was performed without                            difficulty. The patient tolerated the procedure                            well. The quality of the bowel preparation was                            good. The ileocecal valve, appendiceal orifice, and                            rectum were photographed. Scope In: 9:02:08 AM Scope Out: 9:13:08 AM Scope Withdrawal Time: 0 hours 8 minutes 42 seconds  Total Procedure Duration: 0 hours 11 minutes 0 seconds  Findings:      Multiple small and large-mouthed diverticula were found in the left       colon.      A 3 mm polyp was found in the rectum. The polyp was sessile. The polyp       was removed with a cold snare. Resection and retrieval were complete.      The exam was otherwise without abnormality on direct and retroflexion       views. Complications:            No immediate complications. Estimated blood loss:                            None. Estimated Blood Loss:     Estimated blood loss: none. Impression:               - One 3 mm polyp in the rectum, removed with a cold                            snare. Resected and retrieved.                           - The examination was otherwise normal on  direct                            and retroflexion views. Recommendation:           - Patient has a contact number available for                            emergencies. The signs and symptoms of potential                            delayed complications were discussed with the                            patient. Return to normal activities tomorrow.                            Written discharge instructions were provided to the                            patient.                           - Resume previous diet.                           - Continue present medications.                           You will receive a letter within 2-3 weeks with the                            pathology results  and my final recommendations.                           If the polyp(s) is proven to be 'pre-cancerous' on                            pathology, you will need repeat colonoscopy in 5                            years. Milus Banister, MD 10/19/2015 9:15:57 AM This report has been signed electronically. Number of Addenda: 0 Referring MD:      Carlos American. Dewaine Oats

## 2015-10-19 NOTE — Patient Instructions (Signed)
Colon polyp removed today. Diverticulosis seen also. Handouts given on polyps, and diverticulosis. Result letter in your mail in 2-3 weeks. Resume current medications. Call us with any questions or concerns. Thank you!  YOU HAD AN ENDOSCOPIC PROCEDURE TODAY AT [facility]:   Refer to the procedure report that was given to you for any specific questions about what was found during the examination.  If the procedure report does not answer your questions, please call your gastroenterologist to clarify.  If you requested that your care partner not be given the details of your procedure findings, then the procedure report has been included in a sealed envelope for you to review at your convenience later.  YOU SHOULD EXPECT: Some feelings of bloating in the abdomen. Passage of more gas than usual.  Walking can help get rid of the air that was put into your GI tract during the procedure and reduce the bloating. If you had a lower endoscopy (such as a colonoscopy or flexible sigmoidoscopy) you may notice spotting of blood in your stool or on the toilet paper. If you underwent a bowel prep for your procedure, you may not have a normal bowel movement for a few days.  Please Note:  You might notice some irritation and congestion in your nose or some drainage.  This is from the oxygen used during your procedure.  There is no need for concern and it should clear up in a day or so.  SYMPTOMS TO REPORT IMMEDIATELY:   Following lower endoscopy (colonoscopy or flexible sigmoidoscopy):  Excessive amounts of blood in the stool  Significant tenderness or worsening of abdominal pains  Swelling of the abdomen that is new, acute  Fever of 100F or higher   For urgent or emergent issues, a gastroenterologist can be reached at any hour by calling [phone].   DIET: Your first meal following the procedure should be a small meal and then it is ok to progress to your normal diet. Heavy or fried  foods are harder to digest and may make you feel nauseous or bloated.  Likewise, meals heavy in dairy and vegetables can increase bloating.  Drink plenty of fluids but you should avoid alcoholic beverages for 24 hours.  ACTIVITY:  You should plan to take it easy for the rest of today and you should NOT DRIVE or use heavy machinery until tomorrow (because of the sedation medicines used during the test).    FOLLOW UP: Our staff will call the number listed on your records the next business day following your procedure to check on you and address any questions or concerns that you may have regarding the information given to you following your procedure. If we do not reach you, we will leave a message.  However, if you are feeling well and you are not experiencing any problems, there is no need to return our call.  We will assume that you have returned to your regular daily activities without incident.  If any biopsies were taken you will be contacted by phone or by letter within the next 1-3 weeks.  Please call us at [phone] if you have not heard about the biopsies in 3 weeks.    SIGNATURES/CONFIDENTIALITY: You and/or your care partner have signed paperwork which will be entered into your electronic medical record.  These signatures attest to the fact that that the information above on your After Visit Summary has been reviewed and is understood.  Full responsibility of the confidentiality of  this discharge information lies with you and/or your care-partner.

## 2015-10-19 NOTE — Progress Notes (Signed)
A/ox3, pleased with MAC, report to RN 

## 2015-10-19 NOTE — Progress Notes (Signed)
Called to room to assist during endoscopic procedure.  Patient ID and intended procedure confirmed with present staff. Received instructions for my participation in the procedure from the performing physician.  

## 2015-10-19 NOTE — Progress Notes (Signed)
No egg or soy allergy known to patient  No issues with past sedation with any surgeries  or procedures, no intubation problems  No diet pills per patient No home 02 use per patient  No blood thinners per patient  Pt denies issues with constipation   

## 2015-10-20 ENCOUNTER — Telehealth: Payer: Self-pay

## 2015-10-20 NOTE — Telephone Encounter (Signed)
  Follow up Call-  Call back number 10/19/2015  Post procedure Call Back phone  # [phone]  Permission to leave phone message Yes     Patient questions:  Do you have a fever, pain , or abdominal swelling? No. Pain Score  0 *  Have you tolerated food without any problems? Yes.    Have you been able to return to your normal activities? Yes.    Do you have any questions about your discharge instructions: Diet   No. Medications  No. Follow up visit  No.  Do you have questions or concerns about your Care? No.  Actions: * If pain score is 4 or above: No action needed, pain <4.

## 2015-10-28 ENCOUNTER — Encounter: Payer: Self-pay | Admitting: Gastroenterology

## 2015-11-24 ENCOUNTER — Other Ambulatory Visit: Payer: Self-pay | Admitting: Internal Medicine

## 2015-11-24 ENCOUNTER — Other Ambulatory Visit: Payer: Self-pay | Admitting: Nurse Practitioner

## 2015-11-25 ENCOUNTER — Other Ambulatory Visit: Payer: Self-pay | Admitting: Nurse Practitioner

## 2016-01-26 ENCOUNTER — Other Ambulatory Visit: Payer: Self-pay | Admitting: Cardiothoracic Surgery

## 2016-01-26 DIAGNOSIS — C3430 Malignant neoplasm of lower lobe, unspecified bronchus or lung: Secondary | ICD-10-CM

## 2016-02-07 NOTE — Addendum Note (Signed)
Addended by: Moshe Cipro Apphia Cropley A on: 02/07/2016 02:27 PM   Modules accepted: Orders

## 2016-02-08 ENCOUNTER — Other Ambulatory Visit: Payer: Medicare HMO

## 2016-02-08 DIAGNOSIS — R972 Elevated prostate specific antigen [PSA]: Secondary | ICD-10-CM

## 2016-02-08 DIAGNOSIS — I10 Essential (primary) hypertension: Secondary | ICD-10-CM

## 2016-02-08 DIAGNOSIS — I480 Paroxysmal atrial fibrillation: Secondary | ICD-10-CM

## 2016-02-08 DIAGNOSIS — E781 Pure hyperglyceridemia: Secondary | ICD-10-CM

## 2016-02-08 LAB — CBC WITH DIFFERENTIAL/PLATELET
Basophils Absolute: 0 cells/uL (ref 0–200)
Basophils Relative: 0 %
EOS ABS: 79 {cells}/uL (ref 15–500)
Eosinophils Relative: 1 %
HEMATOCRIT: 51.3 % — AB (ref 38.5–50.0)
HEMOGLOBIN: 17.9 g/dL — AB (ref 13.2–17.1)
LYMPHS ABS: 1501 {cells}/uL (ref 850–3900)
Lymphocytes Relative: 19 %
MCH: 32.4 pg (ref 27.0–33.0)
MCHC: 34.9 g/dL (ref 32.0–36.0)
MCV: 92.8 fL (ref 80.0–100.0)
MONO ABS: 632 {cells}/uL (ref 200–950)
MPV: 11.4 fL (ref 7.5–12.5)
Monocytes Relative: 8 %
NEUTROS PCT: 72 %
Neutro Abs: 5688 cells/uL (ref 1500–7800)
Platelets: 223 10*3/uL (ref 140–400)
RBC: 5.53 MIL/uL (ref 4.20–5.80)
RDW: 14.5 % (ref 11.0–15.0)
WBC: 7.9 10*3/uL (ref 3.8–10.8)

## 2016-02-08 LAB — LIPID PANEL
Cholesterol: 155 mg/dL (ref 125–200)
HDL: 36 mg/dL — AB (ref >=40)
LDL CALC: 85 mg/dL (ref <130)
TRIGLYCERIDES: 170 mg/dL — AB (ref <150)
Total CHOL/HDL Ratio: 4.3 Ratio (ref <=5.0)
VLDL: 34 mg/dL — AB (ref <30)

## 2016-02-08 LAB — COMPREHENSIVE METABOLIC PANEL
ALT: 16 U/L (ref 9–46)
AST: 19 U/L (ref 10–35)
Albumin: 4.3 g/dL (ref 3.6–5.1)
Alkaline Phosphatase: 86 U/L (ref 40–115)
BUN: 15 mg/dL (ref 7–25)
CHLORIDE: 105 mmol/L (ref 98–110)
CO2: 21 mmol/L (ref 20–31)
CREATININE: 0.8 mg/dL (ref 0.70–1.18)
Calcium: 9.2 mg/dL (ref 8.6–10.3)
GLUCOSE: 101 mg/dL — AB (ref 65–99)
POTASSIUM: 4.4 mmol/L (ref 3.5–5.3)
SODIUM: 139 mmol/L (ref 135–146)
TOTAL PROTEIN: 6.7 g/dL (ref 6.1–8.1)
Total Bilirubin: 0.6 mg/dL (ref 0.2–1.2)

## 2016-02-08 LAB — TSH: TSH: 1.01 m[IU]/L (ref 0.40–4.50)

## 2016-02-09 LAB — PSA: PSA: 4.7 ng/mL — ABNORMAL HIGH (ref <=4.00)

## 2016-02-14 ENCOUNTER — Encounter: Payer: Self-pay | Admitting: Nurse Practitioner

## 2016-02-14 ENCOUNTER — Ambulatory Visit (INDEPENDENT_AMBULATORY_CARE_PROVIDER_SITE_OTHER): Payer: Medicare HMO | Admitting: Nurse Practitioner

## 2016-02-14 VITALS — BP 160/68 | HR 64 | Temp 97.8°F | Resp 18 | Ht 66.0 in | Wt 204.4 lb

## 2016-02-14 DIAGNOSIS — Z0000 Encounter for general adult medical examination without abnormal findings: Secondary | ICD-10-CM | POA: Diagnosis not present

## 2016-02-14 DIAGNOSIS — E781 Pure hyperglyceridemia: Secondary | ICD-10-CM

## 2016-02-14 DIAGNOSIS — Z418 Encounter for other procedures for purposes other than remedying health state: Secondary | ICD-10-CM

## 2016-02-14 DIAGNOSIS — I10 Essential (primary) hypertension: Secondary | ICD-10-CM

## 2016-02-14 DIAGNOSIS — Z23 Encounter for immunization: Secondary | ICD-10-CM

## 2016-02-14 DIAGNOSIS — Z299 Encounter for prophylactic measures, unspecified: Secondary | ICD-10-CM

## 2016-02-14 DIAGNOSIS — Z720 Tobacco use: Secondary | ICD-10-CM | POA: Diagnosis not present

## 2016-02-14 DIAGNOSIS — H6122 Impacted cerumen, left ear: Secondary | ICD-10-CM | POA: Diagnosis not present

## 2016-02-14 DIAGNOSIS — R972 Elevated prostate specific antigen [PSA]: Secondary | ICD-10-CM | POA: Diagnosis not present

## 2016-02-14 NOTE — Patient Instructions (Addendum)
Food Choices to Lower Your Triglycerides Triglycerides are a type of fat in your blood. High levels of triglycerides can increase the risk of heart disease and stroke. If your triglyceride levels are high, the foods you eat and your eating habits are very important. Choosing the right foods can help lower your triglycerides.  WHAT GENERAL GUIDELINES DO I NEED TO FOLLOW?  Lose weight if you are overweight.   Limit or avoid alcohol.   Fill one half of your plate with vegetables and green salads.   Limit fruit to two servings a day. Choose fruit instead of juice.   Make one fourth of your plate whole grains. Look for the word "whole" as the first word in the ingredient list.  Fill one fourth of your plate with lean protein foods.  Enjoy fatty fish (such as salmon, mackerel, sardines, and tuna) three times a week.   Choose healthy fats.   Limit foods high in starch and sugar.  Eat more home-cooked food and less restaurant, buffet, and fast food.  Limit fried foods.  Cook foods using methods other than frying.  Limit saturated fats.  Check ingredient lists to avoid foods with partially hydrogenated oils (trans fats) in them. WHAT FOODS CAN I EAT?  Grains Whole grains, such as whole wheat or whole grain breads, crackers, cereals, and pasta. Unsweetened oatmeal, bulgur, barley, quinoa, or brown rice. Corn or whole wheat flour tortillas.  Vegetables Fresh or frozen vegetables (raw, steamed, roasted, or grilled). Green salads. Fruits All fresh, canned (in natural juice), or frozen fruits. Meat and Other Protein Products Ground beef (85% or leaner), grass-fed beef, or beef trimmed of fat. Skinless chicken or [place]. Ground chicken or [place]. Pork trimmed of fat. All fish and seafood. Eggs. Dried beans, peas, or lentils. Unsalted nuts or seeds. Unsalted canned or dry beans. Dairy Low-fat dairy products, such as skim or 1% milk, 2% or reduced-fat cheeses, low-fat ricotta or  cottage cheese, or plain low-fat yogurt. Fats and Oils Tub margarines without trans fats. Light or reduced-fat mayonnaise and salad dressings. Avocado. Safflower, olive, or canola oils. Natural peanut or almond butter. The items listed above may not be a complete list of recommended foods or beverages. Contact your dietitian for more options. WHAT FOODS ARE NOT RECOMMENDED?  Grains White bread. White pasta. White rice. Cornbread. Bagels, pastries, and croissants. Crackers that contain trans fat. Vegetables White potatoes. Corn. Creamed or fried vegetables. Vegetables in a cheese sauce. Fruits Dried fruits. Canned fruit in light or heavy syrup. Fruit juice. Meat and Other Protein Products Fatty cuts of meat. Ribs, chicken wings, bacon, sausage, bologna, salami, chitterlings, fatback, hot dogs, bratwurst, and packaged luncheon meats. Dairy Whole or 2% milk, cream, half-and-half, and cream cheese. Whole-fat or sweetened yogurt. Full-fat cheeses. Nondairy creamers and whipped toppings. Processed cheese, cheese spreads, or cheese curds. Sweets and Desserts Corn syrup, sugars, honey, and molasses. Candy. Jam and jelly. Syrup. Sweetened cereals. Cookies, pies, cakes, donuts, muffins, and ice cream. Fats and Oils Butter, stick margarine, lard, shortening, ghee, or bacon fat. Coconut, palm kernel, or palm oils. Beverages Alcohol. Sweetened drinks (such as sodas, lemonade, and fruit drinks or punches). The items listed above may not be a complete list of foods and beverages to avoid. Contact your dietitian for more information.   This information is not intended to replace advice given to you by your health care provider. Make sure you discuss any questions you have with your health care provider.   Document Released: 04/19/2004  Document Revised: 07/23/2014 Document Reviewed: 05/06/2013 Elsevier Interactive Patient Education 2016 [place] for Adults, Male A healthy lifestyle  and preventive care can promote health and wellness. Preventive health guidelines for men include the following key practices:  A routine yearly physical is a good way to check with your health care provider about your health and preventative screening. It is a chance to share any concerns and updates on your health and to receive a thorough exam.  Visit your dentist for a routine exam and preventative care every 6 months. Brush your teeth twice a day and floss once a day. Good oral hygiene prevents tooth decay and gum disease.  The frequency of eye exams is based on your age, health, family medical history, use of contact lenses, and other factors. Follow your health care provider's recommendations for frequency of eye exams.  Eat a healthy diet. Foods such as vegetables, fruits, whole grains, low-fat dairy products, and lean protein foods contain the nutrients you need without too many calories. Decrease your intake of foods high in solid fats, added sugars, and salt. Eat the right amount of calories for you.Get information about a proper diet from your health care provider, if necessary.  Regular physical exercise is one of the most important things you can do for your health. Most adults should get at least 150 minutes of moderate-intensity exercise (any activity that increases your heart rate and causes you to sweat) each week. In addition, most adults need muscle-strengthening exercises on 2 or more days a week.  Maintain a healthy weight. The body mass index (BMI) is a screening tool to identify possible weight problems. It provides an estimate of body fat based on height and weight. Your health care provider can find your BMI and can help you achieve or maintain a healthy weight.For adults 20 years and older:  A BMI below 18.5 is considered underweight.  A BMI of 18.5 to 24.9 is normal.  A BMI of 25 to 29.9 is considered overweight.  A BMI of 30 and above is considered  obese.  Maintain normal blood lipids and cholesterol levels by exercising and minimizing your intake of saturated fat. Eat a balanced diet with plenty of fruit and vegetables. Blood tests for lipids and cholesterol should begin at age 4 and be repeated every 5 years. If your lipid or cholesterol levels are high, you are over 50, or you are at high risk for heart disease, you may need your cholesterol levels checked more frequently.Ongoing high lipid and cholesterol levels should be treated with medicines if diet and exercise are not working.  If you smoke, find out from your health care provider how to quit. If you do not use tobacco, do not start.  Lung cancer screening is recommended for adults aged 58-80 years who are at high risk for developing lung cancer because of a history of smoking. A yearly low-dose CT scan of the lungs is recommended for people who have at least a 30-pack-year history of smoking and are a current smoker or have quit within the past 15 years. A pack year of smoking is smoking an average of 1 pack of cigarettes a day for 1 year (for example: 1 pack a day for 30 years or 2 packs a day for 15 years). Yearly screening should continue until the smoker has stopped smoking for at least 15 years. Yearly screening should be stopped for people who develop a health problem that would prevent them from  having lung cancer treatment.  If you choose to drink alcohol, do not have more than 2 drinks per day. One drink is considered to be 12 ounces (355 mL) of beer, 5 ounces (148 mL) of wine, or 1.5 ounces (44 mL) of liquor.  Avoid use of street drugs. Do not share needles with anyone. Ask for help if you need support or instructions about stopping the use of drugs.  High blood pressure causes heart disease and increases the risk of stroke. Your blood pressure should be checked at least every 1-2 years. Ongoing high blood pressure should be treated with medicines, if weight loss and exercise  are not effective.  If you are 56-46 years old, ask your health care provider if you should take aspirin to prevent heart disease.  Diabetes screening is done by taking a blood sample to check your blood glucose level after you have not eaten for a certain period of time (fasting). If you are not overweight and you do not have risk factors for diabetes, you should be screened once every 3 years starting at age 29. If you are overweight or obese and you are 36-81 years of age, you should be screened for diabetes every year as part of your cardiovascular risk assessment.  Colorectal cancer can be detected and often prevented. Most routine colorectal cancer screening begins at the age of 38 and continues through age 7. However, your health care provider may recommend screening at an earlier age if you have risk factors for colon cancer. On a yearly basis, your health care provider may provide home test kits to check for hidden blood in the stool. Use of a small camera at the end of a tube to directly examine the colon (sigmoidoscopy or colonoscopy) can detect the earliest forms of colorectal cancer. Talk to your health care provider about this at age 31, when routine screening begins. Direct exam of the colon should be repeated every 5-10 years through age 42, unless early forms of precancerous polyps or small growths are found.  People who are at an increased risk for hepatitis B should be screened for this virus. You are considered at high risk for hepatitis B if:  You were born in a country where hepatitis B occurs often. Talk with your health care provider about which countries are considered high risk.  Your parents were born in a high-risk country and you have not received a shot to protect against hepatitis B (hepatitis B vaccine).  You have HIV or AIDS.  You use needles to inject street drugs.  You live with, or have sex with, someone who has hepatitis B.  You are a man who has sex with  other men (MSM).  You get hemodialysis treatment.  You take certain medicines for conditions such as cancer, organ transplantation, and autoimmune conditions.  Hepatitis C blood testing is recommended for all people born from [birth year] through 1965 and any individual with known risks for hepatitis C.  Practice safe sex. Use condoms and avoid high-risk sexual practices to reduce the spread of sexually transmitted infections (STIs). STIs include gonorrhea, chlamydia, syphilis, trichomonas, herpes, HPV, and human immunodeficiency virus (HIV). Herpes, HIV, and HPV are viral illnesses that have no cure. They can result in disability, cancer, and death.  If you are a man who has sex with other men, you should be screened at least once per year for:  HIV.  Urethral, rectal, and pharyngeal infection of gonorrhea, chlamydia, or both.  If you  are at risk of being infected with HIV, it is recommended that you take a prescription medicine daily to prevent HIV infection. This is called preexposure prophylaxis (PrEP). You are considered at risk if:  You are a man who has sex with other men (MSM) and have other risk factors.  You are a heterosexual man, are sexually active, and are at increased risk for HIV infection.  You take drugs by injection.  You are sexually active with a partner who has HIV.  Talk with your health care provider about whether you are at high risk of being infected with HIV. If you choose to begin PrEP, you should first be tested for HIV. You should then be tested every 3 months for as long as you are taking PrEP.  A one-time screening for abdominal aortic aneurysm (AAA) and surgical repair of large AAAs by ultrasound are recommended for men ages 107 to 30 years who are current or former smokers.  Healthy men should no longer receive prostate-specific antigen (PSA) blood tests as part of routine cancer screening. Talk with your health care provider about prostate cancer  screening.  Testicular cancer screening is not recommended for adult males who have no symptoms. Screening includes self-exam, a health care provider exam, and other screening tests. Consult with your health care provider about any symptoms you have or any concerns you have about testicular cancer.  Use sunscreen. Apply sunscreen liberally and repeatedly throughout the day. You should seek shade when your shadow is shorter than you. Protect yourself by wearing long sleeves, pants, a wide-brimmed hat, and sunglasses year round, whenever you are outdoors.  Once a month, do a whole-body skin exam, using a mirror to look at the skin on your back. Tell your health care provider about new moles, moles that have irregular borders, moles that are larger than a pencil eraser, or moles that have changed in shape or color.  Stay current with required vaccines (immunizations).  Influenza vaccine. All adults should be immunized every year.  Tetanus, diphtheria, and acellular pertussis (Td, Tdap) vaccine. An adult who has not previously received Tdap or who does not know his vaccine status should receive 1 dose of Tdap. This initial dose should be followed by tetanus and diphtheria toxoids (Td) booster doses every 10 years. Adults with an unknown or incomplete history of completing a 3-dose immunization series with Td-containing vaccines should begin or complete a primary immunization series including a Tdap dose. Adults should receive a Td booster every 10 years.  Varicella vaccine. An adult without evidence of immunity to varicella should receive 2 doses or a second dose if he has previously received 1 dose.  Human papillomavirus (HPV) vaccine. Males aged 11-21 years who have not received the vaccine previously should receive the 3-dose series. Males aged 22-26 years may be immunized. Immunization is recommended through the age of 77 years for any male who has sex with males and did not get any or all doses  earlier. Immunization is recommended for any person with an immunocompromised condition through the age of 57 years if he did not get any or all doses earlier. During the 3-dose series, the second dose should be obtained 4-8 weeks after the first dose. The third dose should be obtained 24 weeks after the first dose and 16 weeks after the second dose.  Zoster vaccine. One dose is recommended for adults aged 46 years or older unless certain conditions are present.  Measles, mumps, and rubella (MMR) vaccine. Adults  born before 22 generally are considered immune to measles and mumps. Adults born in [birth year] or later should have 1 or more doses of MMR vaccine unless there is a contraindication to the vaccine or there is laboratory evidence of immunity to each of the three diseases. A routine second dose of MMR vaccine should be obtained at least 28 days after the first dose for students attending postsecondary schools, health care workers, or international travelers. People who received inactivated measles vaccine or an unknown type of measles vaccine during 1963-1967 should receive 2 doses of MMR vaccine. People who received inactivated mumps vaccine or an unknown type of mumps vaccine before 1979 and are at high risk for mumps infection should consider immunization with 2 doses of MMR vaccine. Unvaccinated health care workers born before 40 who lack laboratory evidence of measles, mumps, or rubella immunity or laboratory confirmation of disease should consider measles and mumps immunization with 2 doses of MMR vaccine or rubella immunization with 1 dose of MMR vaccine.  Pneumococcal 13-valent conjugate (PCV13) vaccine. When indicated, a person who is uncertain of his immunization history and has no record of immunization should receive the PCV13 vaccine. All adults 83 years of age and older should receive this vaccine. An adult aged 9 years or older who has certain medical conditions and has not been previously  immunized should receive 1 dose of PCV13 vaccine. This PCV13 should be followed with a dose of pneumococcal polysaccharide (PPSV23) vaccine. Adults who are at high risk for pneumococcal disease should obtain the PPSV23 vaccine at least 8 weeks after the dose of PCV13 vaccine. Adults older than 73 years of age who have normal immune system function should obtain the PPSV23 vaccine dose at least 1 year after the dose of PCV13 vaccine.  Pneumococcal polysaccharide (PPSV23) vaccine. When PCV13 is also indicated, PCV13 should be obtained first. All adults aged 73 years and older should be immunized. An adult younger than age 59 years who has certain medical conditions should be immunized. Any person who resides in a nursing home or long-term care facility should be immunized. An adult smoker should be immunized. People with an immunocompromised condition and certain other conditions should receive both PCV13 and PPSV23 vaccines. People with human immunodeficiency virus (HIV) infection should be immunized as soon as possible after diagnosis. Immunization during chemotherapy or radiation therapy should be avoided. Routine use of PPSV23 vaccine is not recommended for American Indians, [place] Natives, or people younger than 65 years unless there are medical conditions that require PPSV23 vaccine. When indicated, people who have unknown immunization and have no record of immunization should receive PPSV23 vaccine. One-time revaccination 5 years after the first dose of PPSV23 is recommended for people aged 19-64 years who have chronic kidney failure, nephrotic syndrome, asplenia, or immunocompromised conditions. People who received 1-2 doses of PPSV23 before age 61 years should receive another dose of PPSV23 vaccine at age 14 years or later if at least 5 years have passed since the previous dose. Doses of PPSV23 are not needed for people immunized with PPSV23 at or after age 4 years.  Meningococcal vaccine. Adults with  asplenia or persistent complement component deficiencies should receive 2 doses of quadrivalent meningococcal conjugate (MenACWY-D) vaccine. The doses should be obtained at least 2 months apart. Microbiologists working with certain meningococcal bacteria, [place] recruits, people at risk during an outbreak, and people who travel to or live in countries with a high rate of meningitis should be immunized. A Market researcher  up through age 61 years who is living in a residence hall should receive a dose if he did not receive a dose on or after his 16th birthday. Adults who have certain high-risk conditions should receive one or more doses of vaccine.  Hepatitis A vaccine. Adults who wish to be protected from this disease, have chronic liver disease, work with hepatitis A-infected animals, work in hepatitis A research labs, or travel to or work in countries with a high rate of hepatitis A should be immunized. Adults who were previously unvaccinated and who anticipate close contact with an international adoptee during the first 60 days after arrival in the [place] from a country with a high rate of hepatitis A should be immunized.  Hepatitis B vaccine. Adults should be immunized if they wish to be protected from this disease, are under age 80 years and have diabetes, have chronic liver disease, have had more than one sex partner in the past 6 months, may be exposed to blood or other infectious body fluids, are household contacts or sex partners of hepatitis B positive people, are clients or workers in certain care facilities, or travel to or work in countries with a high rate of hepatitis B.  Haemophilus influenzae type b (Hib) vaccine. A previously unvaccinated person with asplenia or sickle cell disease or having a scheduled splenectomy should receive 1 dose of Hib vaccine. Regardless of previous immunization, a recipient of a hematopoietic stem cell transplant should receive a 3-dose series 6-12  months after his successful transplant. Hib vaccine is not recommended for adults with HIV infection. Preventive Service / Frequency Ages 83 to 61  Blood pressure check.** / Every 3-5 years.  Lipid and cholesterol check.** / Every 5 years beginning at age 94.  Hepatitis C blood test.** / For any individual with known risks for hepatitis C.  Skin self-exam. / Monthly.  Influenza vaccine. / Every year.  Tetanus, diphtheria, and acellular pertussis (Tdap, Td) vaccine.** / Consult your health care provider. 1 dose of Td every 10 years.  Varicella vaccine.** / Consult your health care provider.  HPV vaccine. / 3 doses over 6 months, if 11 or younger.  Measles, mumps, rubella (MMR) vaccine.** / You need at least 1 dose of MMR if you were born in 1957 or later. You may also need a second dose.  Pneumococcal 13-valent conjugate (PCV13) vaccine.** / Consult your health care provider.  Pneumococcal polysaccharide (PPSV23) vaccine.** / 1 to 2 doses if you smoke cigarettes or if you have certain conditions.  Meningococcal vaccine.** / 1 dose if you are age 81 to 74 years and a Market researcher living in a residence hall, or have one of several medical conditions. You may also need additional booster doses.  Hepatitis A vaccine.** / Consult your health care provider.  Hepatitis B vaccine.** / Consult your health care provider.  Haemophilus influenzae type b (Hib) vaccine.** / Consult your health care provider. Ages 77 to 74  Blood pressure check.** / Every year.  Lipid and cholesterol check.** / Every 5 years beginning at age 60.  Lung cancer screening. / Every year if you are aged 43-80 years and have a 30-pack-year history of smoking and currently smoke or have quit within the past 15 years. Yearly screening is stopped once you have quit smoking for at least 15 years or develop a health problem that would prevent you from having lung cancer treatment.  Fecal occult blood test  (FOBT) of stool. / Every year  beginning at age 59 and continuing until age 60. You may not have to do this test if you get a colonoscopy every 10 years.  Flexible sigmoidoscopy** or colonoscopy.** / Every 5 years for a flexible sigmoidoscopy or every 10 years for a colonoscopy beginning at age 87 and continuing until age 66.  Hepatitis C blood test.** / For all people born from [birth year] through 1965 and any individual with known risks for hepatitis C.  Skin self-exam. / Monthly.  Influenza vaccine. / Every year.  Tetanus, diphtheria, and acellular pertussis (Tdap/Td) vaccine.** / Consult your health care provider. 1 dose of Td every 10 years.  Varicella vaccine.** / Consult your health care provider.  Zoster vaccine.** / 1 dose for adults aged 74 years or older.  Measles, mumps, rubella (MMR) vaccine.** / You need at least 1 dose of MMR if you were born in 1957 or later. You may also need a second dose.  Pneumococcal 13-valent conjugate (PCV13) vaccine.** / Consult your health care provider.  Pneumococcal polysaccharide (PPSV23) vaccine.** / 1 to 2 doses if you smoke cigarettes or if you have certain conditions.  Meningococcal vaccine.** / Consult your health care provider.  Hepatitis A vaccine.** / Consult your health care provider.  Hepatitis B vaccine.** / Consult your health care provider.  Haemophilus influenzae type b (Hib) vaccine.** / Consult your health care provider. Ages 3 and over  Blood pressure check.** / Every year.  Lipid and cholesterol check.**/ Every 5 years beginning at age 60.  Lung cancer screening. / Every year if you are aged 71-80 years and have a 30-pack-year history of smoking and currently smoke or have quit within the past 15 years. Yearly screening is stopped once you have quit smoking for at least 15 years or develop a health problem that would prevent you from having lung cancer treatment.  Fecal occult blood test (FOBT) of stool. / Every year  beginning at age 57 and continuing until age 71. You may not have to do this test if you get a colonoscopy every 10 years.  Flexible sigmoidoscopy** or colonoscopy.** / Every 5 years for a flexible sigmoidoscopy or every 10 years for a colonoscopy beginning at age 67 and continuing until age 21.  Hepatitis C blood test.** / For all people born from [birth year] through 1963 and any individual with known risks for hepatitis C.  Abdominal aortic aneurysm (AAA) screening.** / A one-time screening for ages 21 to 56 years who are current or former smokers.  Skin self-exam. / Monthly.  Influenza vaccine. / Every year.  Tetanus, diphtheria, and acellular pertussis (Tdap/Td) vaccine.** / 1 dose of Td every 10 years.  Varicella vaccine.** / Consult your health care provider.  Zoster vaccine.** / 1 dose for adults aged 56 years or older.  Pneumococcal 13-valent conjugate (PCV13) vaccine.** / 1 dose for all adults aged 60 years and older.  Pneumococcal polysaccharide (PPSV23) vaccine.** / 1 dose for all adults aged 28 years and older.  Meningococcal vaccine.** / Consult your health care provider.  Hepatitis A vaccine.** / Consult your health care provider.  Hepatitis B vaccine.** / Consult your health care provider.  Haemophilus influenzae type b (Hib) vaccine.** / Consult your health care provider. **Family history and personal history of risk and conditions may change your health care provider's recommendations.   This information is not intended to replace advice given to you by your health care provider. Make sure you discuss any questions you have with your health care provider.  Document Released: 08/28/2001 Document Revised: 07/23/2014 Document Reviewed: 11/27/2010 Elsevier Interactive Patient Education [employer].

## 2016-02-14 NOTE — Progress Notes (Signed)
PCP: [name], NP  Advanced Directive information Does patient have an advance directive?: No  Allergies  Allergen Reactions  . Asa [Aspirin] Anaphylaxis and Swelling    Angioedema/Eyes and lip swelling  . Nsaids Hives  . Naproxen Hives    Chief Complaint  Patient presents with  . Medical Management of Chronic Issues    Complete physical. labs printed. Passed clock drawing.     HPI: Patient is a 75 y.o. male seen in the office today for annual wellness exam. Reports he is doing well and feeling great.   Screenings: Colon Cancer- colonoscopy done in March 2017, follow up in 5 years Prostate Cancer- following PSA, last PSA 4.7. No changes in urine or stream, feels like it is better.  Depression screening- Depression screen [facility] 2/9 02/14/2016 03/22/2015 08/09/2014 06/30/2014 06/24/2014  Decreased Interest 0 0 0 0 0  Down, Depressed, Hopeless 0 0 0 0 0  PHQ - 2 Score 0 0 0 0 0   Falls- no injuries, tripped over nutrasystem box in the kitchen Fall Risk  02/14/2016 08/09/2015 06/02/2015 05/10/2015 03/22/2015  Falls in the past year? Yes Yes Yes No No  Number falls in past yr: - 1 1 - -  Injury with Fall? No No No - -   MMSE MMSE - Mini Mental State Exam 02/14/2016 07/29/2014  Orientation to time 5 5  Orientation to Place 5 5  Registration 3 3  Attention/ Calculation 5 5  Recall 3 3  Language- name 2 objects 2 2  Language- repeat 1 1  Language- follow 3 step command 3 3  Language- read & follow direction 1 1  Write a sentence 0 1  Copy design 1 1  Total score 29 30   Vaccines Immunization History  Administered Date(s) Administered  . Influenza-Unspecified 04/29/2014  . Pneumococcal-Unspecified 04/29/2014    Smoking status: started smoking again in January, smoking 1/2 pack a day at this time  Has done hypnosis, patch, does not wish to take pill to help Alcohol use: none  Dentist: does not go routinely, only has 6 teeth left does not feel like this  necessary- education provided Ophthalmologist: has not gone, 20/40 to both eyes today  Exercise regimen: none- due to increase pain in back. Does mow the grass weekly.  Diet: using nutrasystem- low calorie diet.   Functional Status of ADLs: independent of all ADLs and iALDs   Ongoing follow up with Dr Servando Snare due to lung cancer No longer seeing cardiology- has been released  Nerve block done by [facility], following Dr Dossie Der and Dr Rolena Infante due to chronic back pain.  Also following with Spinal specialist    Review of Systems:  Review of Systems  Constitutional: Negative for activity change, appetite change and fatigue.  HENT: Negative for congestion, nosebleeds and postnasal drip.   Eyes: Negative.   Respiratory: Negative for cough and shortness of breath.   Cardiovascular: Negative for chest pain and leg swelling.  Gastrointestinal: Negative.   Genitourinary: Negative for difficulty urinating, dysuria and frequency.  Musculoskeletal: Positive for arthralgias and back pain. Negative for gait problem, joint swelling, myalgias and neck stiffness.  Neurological: Positive for numbness (left leg).  Psychiatric/Behavioral: Negative for agitation and sleep disturbance. The patient is not nervous/anxious.     Past Medical History:  Diagnosis Date  . A-fib ([place])   . Acute appendicitis with peritoneal abscess 01/28/2013  . Arthritis   . Bacteremia September 2015  . Elevated PSA   .  Essential hypertension 04/16/2014  . Heart murmur    childhood  . Ileus, postoperative 01/28/2013  . Lung cancer ([place])   . Obesity, morbid ([place]) 01/28/2013  . Shortness of breath   . Tobacco use disorder 01/28/2013   Past Surgical History:  Procedure Laterality Date  . APPENDECTOMY    . [place], 2003   Dr.Apleton   . CARPAL TUNNEL RELEASE    . COLONOSCOPY     x 3  . EYE SURGERY Left    muscle of the eye   . KNEE SURGERY Bilateral   . LAPAROSCOPIC APPENDECTOMY N/A 01/25/2013    Procedure: APPENDECTOMY LAPAROSCOPIC;  Surgeon: Ralene Ok, MD;  Location: [facility];  Service: General;  Laterality: N/A;  . LYMPH NODE DISSECTION Left 04/26/2015   Procedure: LYMPH NODE DISSECTION;  Surgeon: Grace Isaac, MD;  Location: [facility];  Service: Thoracic;  Laterality: Left;  . SHOULDER SURGERY Right 2014   Dr.Whitfield rotator cuff surgery  . TEE WITHOUT CARDIOVERSION N/A 04/07/2014   Procedure: TRANSESOPHAGEAL ECHOCARDIOGRAM (TEE);  Surgeon: Candee Furbish, MD;  Location: [facility] ENDOSCOPY;  Service: Cardiovascular;  Laterality: N/A;  . VIDEO ASSISTED THORACOSCOPY (VATS)/WEDGE RESECTION Left 04/26/2015   Procedure: LEFT VIDEO ASSISTED THORACOSCOPY (VATS) WITH LEFT LOWER LOBECTOMY;  Surgeon: Grace Isaac, MD;  Location: [facility];  Service: Thoracic;  Laterality: Left;  Marland Kitchen VIDEO BRONCHOSCOPY N/A 04/26/2015   Procedure: VIDEO BRONCHOSCOPY;  Surgeon: Grace Isaac, MD;  Location: [facility];  Service: Thoracic;  Laterality: N/A;   Social History:   reports that he has been smoking Cigars.  He started smoking about 57 years ago. He has a 25.00 pack-year smoking history. He has quit using smokeless tobacco. His smokeless tobacco use included Chew. He reports that he does not drink alcohol or use drugs.  Family History  Problem Relation Age of Onset  . Irritable bowel syndrome Mother   . Cancer Neg Hx   . Lung disease Neg Hx   . Colon cancer Neg Hx   . Colon polyps Neg Hx     Medications: Patient's Medications  New Prescriptions   No medications on file  Previous Medications   HYDRALAZINE (APRESOLINE) 25 MG TABLET    Take one tablet by mouth three times daily for blood pressure   LOSARTAN (COZAAR) 50 MG TABLET    TAKE 1 TABLET BY MOUTH EVERY DAY FOR BLOOD PRESSURE   METOPROLOL TARTRATE (LOPRESSOR) 25 MG TABLET    TAKE 1/2 TABLET(12.5 MG) BY MOUTH TWICE DAILY  Modified Medications   No medications on file  Discontinued Medications   METOPROLOL TARTRATE (LOPRESSOR) 25 MG TABLET     TAKE 1/2 TABLET(12.5 MG) BY MOUTH TWICE DAILY     Physical Exam:  Vitals:   02/14/16 1047  BP: (!) 160/68  Pulse: 64  Resp: 18  Temp: 97.8 F (36.6 C)  TempSrc: Oral  SpO2: 97%  Weight: 204 lb 6.4 oz (92.7 kg)  Height: '5\' 6"'$  (1.676 m)   Body mass index is 32.99 kg/m.  Physical Exam  Constitutional: He is oriented to person, place, and time. He appears well-developed and well-nourished. No distress.  HENT:  Head: Normocephalic and atraumatic.  Right Ear: External ear normal.  Left Ear: External ear normal.  Nose: Nose normal.  Mouth/Throat: Oropharynx is clear and moist.  Eyes: Conjunctivae are normal. Pupils are equal, round, and reactive to light.  Neck: Normal range of motion. Neck supple.  Cardiovascular: Normal rate, regular rhythm, normal heart sounds  and intact distal pulses.   Pulmonary/Chest: Effort normal and breath sounds normal.  Abdominal: Soft. Bowel sounds are normal. He exhibits no distension.  Musculoskeletal: Normal range of motion. He exhibits no edema or tenderness.  Neurological: He is alert and oriented to person, place, and time. He has normal reflexes. No cranial nerve deficit.  Skin: Skin is warm and dry. He is not diaphoretic.  Psychiatric: He has a normal mood and affect.    Labs reviewed: Basic Metabolic Panel:  Recent Labs  04/27/15 1810 04/27/15 1823 04/28/15 0351 06/02/15 2042 02/08/16 0815  NA 137  --  138 141 139  K 4.3  --  4.1 4.0 4.4  CL 104  --  101 105 105  CO2 26  --  '27 24 21  '$ GLUCOSE 109*  --  123* 183* 101*  BUN 15  --  13 24* 15  CREATININE 0.96  --  0.90 1.03 0.80  CALCIUM 8.5*  --  8.7* 9.3 9.2  MG 2.5*  --   --   --   --   TSH  --  0.194*  --   --  1.01   Liver Function Tests:  Recent Labs  04/28/15 0351 06/02/15 2042 02/08/16 0815  AST '26 28 19  '$ ALT 23 33 16  ALKPHOS 55 89 86  BILITOT 0.5 0.5 0.6  PROT 6.4* 6.9 6.7  ALBUMIN 3.1* 3.9 4.3   No results for input(s): LIPASE, AMYLASE in the last 8760  hours. No results for input(s): AMMONIA in the last 8760 hours. CBC:  Recent Labs  04/28/15 0351 06/02/15 2042 02/08/16 0815  WBC 11.6* 9.2 7.9  NEUTROABS  --   --  5,688  HGB 14.6 15.3 17.9*  HCT 44.6 47.0 51.3*  MCV 98.7 97.9 92.8  PLT 169 256 223   Lipid Panel:  Recent Labs  02/08/16 0815  CHOL 155  HDL 36*  LDLCALC 85  TRIG 170*  CHOLHDL 4.3   TSH:  Recent Labs  04/27/15 1823 02/08/16 0815  TSH 0.194* 1.01   A1C: Lab Results  Component Value Date   HGBA1C 5.3 07/27/2014     Assessment/Plan 1. Medicare annual wellness visit, subsequent -doing well today -The patient was counseled regarding the appropriate use of alcohol, regular self-examination of the breasts on a monthly basis, prevention of dental and periodontal disease, diet, regular sustained exercise for at least 30 minutes 5 times per week, testicular self-examination on a monthly basis, smoking cessation, tobacco use,  and recommended schedule for GI hemoccult testing, colonoscopy, cholesterol, thyroid and diabetes screening. -MMSE 29/30 -no depression or anxiety -discussed decluttering house due to falling over a box in his kitchen -to keep follow up with specialist.  -encouraged exercise that pt is able to tolerate  2. Tobacco abuse -pt had previously stopped smoking but started back at the beginning of the year. Not interested in PO medications. Discussed importance of cessation   3. Essential hypertension 122/72 on recheck.   4. Elevated PSA -PSA slightly elevated, but down from last year. [place] cont to monitor  5. Hypertriglyceridemia -discussed low triglyceride diet  6. Cerumen impaction, left -removed via ear lavage, pt tolerated well.    Carlos American. Harle Battiest  [facility] [phone] 8 am - 5 pm) [phone] (after hours)

## 2016-02-23 ENCOUNTER — Encounter: Payer: Self-pay | Admitting: Cardiothoracic Surgery

## 2016-02-23 ENCOUNTER — Ambulatory Visit (INDEPENDENT_AMBULATORY_CARE_PROVIDER_SITE_OTHER): Payer: Medicare HMO | Admitting: Cardiothoracic Surgery

## 2016-02-23 ENCOUNTER — Ambulatory Visit
Admission: RE | Admit: 2016-02-23 | Discharge: 2016-02-23 | Disposition: A | Discharge status: Home / Self Care | Payer: Medicare HMO | Source: Ambulatory Visit | Attending: Cardiothoracic Surgery | Admitting: Cardiothoracic Surgery

## 2016-02-23 VITALS — BP 124/60 | HR 63 | Resp 16 | Ht 66.0 in | Wt 204.0 lb

## 2016-02-23 DIAGNOSIS — C3492 Malignant neoplasm of unspecified part of left bronchus or lung: Secondary | ICD-10-CM | POA: Diagnosis not present

## 2016-02-23 DIAGNOSIS — C3432 Malignant neoplasm of lower lobe, left bronchus or lung: Secondary | ICD-10-CM

## 2016-02-23 DIAGNOSIS — Z902 Acquired absence of lung [part of]: Secondary | ICD-10-CM

## 2016-02-23 DIAGNOSIS — C3430 Malignant neoplasm of lower lobe, unspecified bronchus or lung: Secondary | ICD-10-CM

## 2016-02-23 NOTE — Progress Notes (Signed)
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 PROCEDURE: Left lower lobectomy and lymph node dissection, video bronchoscopy, left video-assisted thoracoscopy, and placement of On-Q. 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  He has no symptoms of shortness of breath, or hemoptysis. Unfortunately he has resumed smoking after being smoke-free for 4 months.  Past Medical History:  Diagnosis Date  . A-fib ([place])   . Acute appendicitis with peritoneal abscess 01/28/2013  . Arthritis   . Bacteremia September 2015  . Elevated PSA   . Essential hypertension 04/16/2014  . Heart murmur    childhood  . Ileus, postoperative 01/28/2013  . Lung cancer ([place])   . Obesity, morbid ([place]) 01/28/2013  . Shortness of breath   . Tobacco use disorder 01/28/2013     History  Smoking Status  . Current Every Day Smoker  . Packs/day: 0.50  . Years: 50.00  . Types: Cigars  . Start date: 07/16/1958  Smokeless Tobacco  . Former Systems developer  . Types: Chew    History  Alcohol Use No     Allergies  Allergen Reactions  . Asa [Aspirin] Anaphylaxis and Swelling    Angioedema/Eyes and lip swelling  . Nsaids Hives  . Naproxen Hives    Current Outpatient  Prescriptions  Medication Sig Dispense Refill  . hydrALAZINE (APRESOLINE) 25 MG tablet Take one tablet by mouth three times daily for blood pressure 90 tablet 11  . losartan (COZAAR) 50 MG tablet TAKE 1 TABLET BY MOUTH EVERY DAY FOR BLOOD PRESSURE 90 tablet 0  . metoprolol tartrate (LOPRESSOR) 25 MG tablet TAKE 1/2 TABLET(12.5 MG) BY MOUTH TWICE DAILY 90 tablet 4   No current facility-administered medications for this visit.        Physical Exam: BP 124/60   Pulse 63   Resp 16   Ht '5\' 6"'$  (1.676 m)   Wt 204 lb (92.5 kg)   SpO2 98% Comment: RA  BMI 32.93 kg/m   General appearance: alert, cooperative and no distress Neurologic: intact Heart: regular rate and rhythm, S1, S2 normal, no murmur, click, rub or gallop Lungs: clear to auscultation bilaterally Abdomen: soft, non-tender; bowel sounds normal; no masses,  no organomegaly Extremities: extremities normal, atraumatic, no cyanosis or edema and Homans sign is negative, no sign of DVT Wound: Left port sites chest tube sites and incision are all healing well No cervical or supraclavicular adenopathy noted  Diagnostic Studies & Laboratory data:     Recent Radiology Findings:  Ct Chest Wo Contrast  Result Date: 02/23/2016 CLINICAL DATA:  History of lung cancer status post left lower lobe wedge resection 04/26/2015. EXAM: CT CHEST  WITHOUT CONTRAST TECHNIQUE: Multidetector CT imaging of the chest was performed following the standard protocol without IV contrast. COMPARISON:  Chest CT 09/26/2015 and PET CT 03/07/2015. FINDINGS: Cardiovascular: Atherosclerosis of aorta, great vessels and coronary arteries again noted. No acute vascular findings are demonstrated on noncontrast imaging. The heart size is normal. There is no pericardial effusion. Mediastinum/Nodes: Small mediastinal lymph nodes are unchanged. Hilar assessment is limited by the lack of intravenous contrast, although the hilar contours appear unchanged. The thyroid gland,  trachea and esophagus demonstrate no significant findings. Lungs/Pleura: Small left pleural effusion has decreased in volume. There is no significant pleural fluid on the right. Stable postsurgical changes status post left lower lobe resection. There is a stable calcified lingular granuloma. There is a stable small perifissural nodule along the minor fissure on image 63. There is a stable tiny right upper lobe nodule on image 34. No suspicious pulmonary nodules. Upper abdomen: The visualized upper abdomen appears stable without suspicious findings. Low-density hepatic lesion is unchanged, likely a cyst. Musculoskeletal/Chest wall: There is no chest wall mass or suspicious osseous finding. Thoracotomy defects noted on the left. Several probable epidermal inclusion (sebaceous) cysts again noted posteriorly in the right chest wall. IMPRESSION: 1. Interval improvement in small loculated left pleural effusion following left lower lobe resection. 2. No evidence of local recurrence or metastatic disease. 3. Atherosclerosis, as before. Electronically Signed   By: Richardean Sale M.D.   On: 02/23/2016 11:52   I have independently reviewed the above radiology studies  and reviewed the findings with the patient.     Ct Chest Wo Contrast  09/26/2015  CLINICAL DATA:  Left lower lobe lung cancer and resection. Weight loss. EXAM: CT CHEST WITHOUT CONTRAST TECHNIQUE: Multidetector CT imaging of the chest was performed following the standard protocol without IV contrast. COMPARISON:  PET 03/07/2015 and CT chest 08/05/2014. FINDINGS: Mediastinum/Nodes: Mediastinal lymph nodes are not enlarged by CT size criteria. Hilar regions are difficult to definitively evaluate without IV contrast. No axillary adenopathy. Atherosclerotic calcification of the arterial vasculature, including three-vessel involvement of the coronary arteries. Heart size normal. No pericardial effusion. Lungs/Pleura: Postoperative changes of left lower  lobectomy with a small left pleural effusion. Minimal linear volume loss in the left upper lobe. Calcified granuloma in the lingula. 4 mm right upper lobe nodule (4/19), unchanged. Airway is otherwise unremarkable. Upper abdomen: Low-attenuation lesion in the dome of the liver measures 1.8 cm, as before. Visualized portions of the liver, gallbladder, adrenal glands, kidneys, spleen, pancreas, stomach and bowel are otherwise grossly unremarkable. No upper abdominal adenopathy. Musculoskeletal: No worrisome lytic or sclerotic lesions. Degenerative changes are seen in the spine. Thoracotomy changes on the left. Probable subcutaneous sebaceous cysts overlying the right scapula and posterior right ribs. IMPRESSION: 1. Postoperative changes of left lower lobectomy without evidence of recurrent or metastatic disease. 2. Small left pleural effusion. 3. Three-vessel coronary artery calcification. Electronically Signed   By: Lorin Picket M.D.   On: 09/26/2015 14:18      Recent Lab Findings: Lab Results  Component Value Date   WBC 7.9 02/08/2016   HGB 17.9 (H) 02/08/2016   HCT 51.3 (H) 02/08/2016   PLT 223 02/08/2016   GLUCOSE 101 (H) 02/08/2016   CHOL 155 02/08/2016   TRIG 170 (H) 02/08/2016   HDL 36 (L) 02/08/2016   LDLCALC 85 02/08/2016   ALT 16 02/08/2016   AST 19 02/08/2016   NA 139 02/08/2016   K 4.4 02/08/2016   CL 105 02/08/2016  CREATININE 0.80 02/08/2016   BUN 15 02/08/2016   CO2 21 02/08/2016   TSH 1.01 02/08/2016   INR 1.00 04/20/2015   HGBA1C 5.3 07/27/2014      Assessment / Plan:    Patient doing well  postop now about 10 months since left lower lobectomy for a 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 The patient's case was discussed at the multi-this may thoracic oncology conference no further treatment was recommended other than observation. I'll plan to see him back in 6 months  with a follow-up CT scan of the chest   Grace Isaac MD      [address] [place] [address] Office [phone]   Beeper [phone]  02/23/2016 12:39 PM

## 2016-03-07 ENCOUNTER — Other Ambulatory Visit: Payer: Self-pay | Admitting: Internal Medicine

## 2016-03-12 ENCOUNTER — Other Ambulatory Visit: Payer: Self-pay

## 2016-03-12 MED ORDER — HYDRALAZINE HCL 25 MG PO TABS: ORAL_TABLET | ORAL | 11 refills | Status: DC

## 2016-03-22 ENCOUNTER — Ambulatory Visit: Payer: Medicare HMO | Admitting: Cardiothoracic Surgery

## 2016-04-25 ENCOUNTER — Other Ambulatory Visit: Payer: Self-pay | Admitting: Internal Medicine

## 2016-05-05 ENCOUNTER — Other Ambulatory Visit: Payer: Self-pay | Admitting: Cardiology

## 2016-05-07 ENCOUNTER — Encounter: Payer: Self-pay | Admitting: Nurse Practitioner

## 2016-05-07 ENCOUNTER — Ambulatory Visit (INDEPENDENT_AMBULATORY_CARE_PROVIDER_SITE_OTHER): Payer: Medicare HMO | Admitting: Nurse Practitioner

## 2016-05-07 VITALS — BP 132/78 | HR 89 | Temp 98.5°F | Resp 18 | Ht 66.0 in | Wt 209.8 lb

## 2016-05-07 DIAGNOSIS — J0100 Acute maxillary sinusitis, unspecified: Secondary | ICD-10-CM | POA: Diagnosis not present

## 2016-05-07 MED ORDER — DM-GUAIFENESIN ER 30-600 MG PO TB12: 1.0000 | ORAL_TABLET | Freq: Two times a day (BID) | ORAL | Status: AC

## 2016-05-07 NOTE — Patient Instructions (Addendum)
Mucinex DM by mouth twice daily for 1 week  May use tylenol as needed Plain Saline nasal spray as needed throughout the day Cont Flonase as directed by ENT     Sinusitis, Adult Sinusitis is redness, soreness, and inflammation of the paranasal sinuses. Paranasal sinuses are air pockets within the bones of your face. They are located beneath your eyes, in the middle of your forehead, and above your eyes. In healthy paranasal sinuses, mucus is able to drain out, and air is able to circulate through them by way of your nose. However, when your paranasal sinuses are inflamed, mucus and air can become trapped. This can allow bacteria and other germs to grow and cause infection. Sinusitis can develop quickly and last only a short time (acute) or continue over a long period (chronic). Sinusitis that lasts for more than 12 weeks is considered chronic. CAUSES Causes of sinusitis include:  Allergies.  Structural abnormalities, such as displacement of the cartilage that separates your nostrils (deviated septum), which can decrease the air flow through your nose and sinuses and affect sinus drainage.  Functional abnormalities, such as when the small hairs (cilia) that line your sinuses and help remove mucus do not work properly or are not present. SIGNS AND SYMPTOMS Symptoms of acute and chronic sinusitis are the same. The primary symptoms are pain and pressure around the affected sinuses. Other symptoms include:  Upper toothache.  Earache.  Headache.  Bad breath.  Decreased sense of smell and taste.  A cough, which worsens when you are lying flat.  Fatigue.  Fever.  Thick drainage from your nose, which often is green and may contain pus (purulent).  Swelling and warmth over the affected sinuses. DIAGNOSIS Your health care provider will perform a physical exam. During your exam, your health care provider may perform any of the following to help determine if you have acute sinusitis or  chronic sinusitis:  Look in your nose for signs of abnormal growths in your nostrils (nasal polyps).  Tap over the affected sinus to check for signs of infection.  View the inside of your sinuses using an imaging device that has a light attached (endoscope). If your health care provider suspects that you have chronic sinusitis, one or more of the following tests may be recommended:  Allergy tests.  Nasal culture. A sample of mucus is taken from your nose, sent to a lab, and screened for bacteria.  Nasal cytology. A sample of mucus is taken from your nose and examined by your health care provider to determine if your sinusitis is related to an allergy. TREATMENT Most cases of acute sinusitis are related to a viral infection and will resolve on their own within 10 days. Sometimes, medicines are prescribed to help relieve symptoms of both acute and chronic sinusitis. These may include pain medicines, decongestants, nasal steroid sprays, or saline sprays. However, for sinusitis related to a bacterial infection, your health care provider will prescribe antibiotic medicines. These are medicines that will help kill the bacteria causing the infection. Rarely, sinusitis is caused by a fungal infection. In these cases, your health care provider will prescribe antifungal medicine. For some cases of chronic sinusitis, surgery is needed. Generally, these are cases in which sinusitis recurs more than 3 times per year, despite other treatments. HOME CARE INSTRUCTIONS  Drink plenty of water. Water helps thin the mucus so your sinuses can drain more easily.  Use a humidifier.  Inhale steam 3-4 times a day (for example, sit in  the bathroom with the shower running).  Apply a warm, moist washcloth to your face 3-4 times a day, or as directed by your health care provider.  Use saline nasal sprays to help moisten and clean your sinuses.  Take medicines only as directed by your health care provider.  If  you were prescribed either an antibiotic or antifungal medicine, finish it all even if you start to feel better. SEEK IMMEDIATE MEDICAL CARE IF:  You have increasing pain or severe headaches.  You have nausea, vomiting, or drowsiness.  You have swelling around your face.  You have vision problems.  You have a stiff neck.  You have difficulty breathing.   This information is not intended to replace advice given to you by your health care provider. Make sure you discuss any questions you have with your health care provider.   Document Released: 07/02/2005 Document Revised: 07/23/2014 Document Reviewed: 07/17/2011 Elsevier Interactive Patient Education [employer].

## 2016-05-07 NOTE — Telephone Encounter (Signed)
hydrALAZINE (APRESOLINE) 25 MG tablet  Medication  Date: 03/12/2016 Department: [address] Office Ordering/Authorizing: Jerline Pain, MD  Order Providers   Prescribing Provider Encounter Provider  Jerline Pain, MD Bobby Rumpf, CMA  Medication Detail    Disp Refills Start End   hydrALAZINE (APRESOLINE) 25 MG tablet 90 tablet 11 03/12/2016    Sig: Take one tablet by mouth three times daily for blood pressure   E-Prescribing Status: Receipt confirmed by pharmacy (03/12/2016 3:26 PM EDT)   Pharmacy   [id number] - [place], [place]

## 2016-05-07 NOTE — Progress Notes (Signed)
Careteam: Patient Care Team: [name], NP as PCP - General (Nurse Practitioner) Druscilla Brownie, MD as Consulting Physician (Dermatology)  Advanced Directive information Does patient have an advance directive?: No, Would patient like information on creating an advanced directive?: Yes - Educational materials given  Allergies  Allergen Reactions  . Asa [Aspirin] Anaphylaxis and Swelling    Angioedema/Eyes and lip swelling  . Nsaids Hives  . Naproxen Hives    Chief Complaint  Patient presents with  . Acute Visit    some congestion and cough x 3 days. Was given flonase by ENT 3 days ago.      HPI: Patient is a 75 y.o. male seen in the office today due to cough and congestion.  5 days ago pt was seen for ears and for hearing aids check and afterwards they worked great. Once he was home he started feeling bad. Ears felt full, head and chest full. Cough and blowing nose a lot. Was better when he was sitting up. Had to get up and sit up which helped. Mostly feeling full in his head. Throat was sore and scratchy but that is better. Cough has persisted. Feeling run down and tired. Increase lethargy. Still eating and drinking. Decrease appetite.  Today feels better than yesterday.  Saw ENT 4 days ago and started flonase and taking tylenol which helped.   Review of Systems:  Review of Systems  Constitutional: Positive for appetite change and fatigue. Negative for activity change.  HENT: Positive for congestion, hearing loss, postnasal drip, rhinorrhea and sinus pressure. Negative for ear pain, facial swelling, nosebleeds, sore throat and tinnitus.   Eyes: Negative.   Respiratory: Positive for cough. Negative for shortness of breath.   Cardiovascular: Negative for chest pain and leg swelling.  Gastrointestinal: Negative.   Neurological: Negative for dizziness, light-headedness and headaches.    Past Medical History:  Diagnosis Date  . A-fib ([place])   . Acute appendicitis  with peritoneal abscess 01/28/2013  . Arthritis   . Bacteremia September 2015  . Elevated PSA   . Essential hypertension 04/16/2014  . Heart murmur    childhood  . Ileus, postoperative ([place]) 01/28/2013  . Lung cancer ([place])   . Obesity, morbid ([place]) 01/28/2013  . Shortness of breath   . Tobacco use disorder 01/28/2013   Past Surgical History:  Procedure Laterality Date  . APPENDECTOMY    . [place], 2003   Dr.Apleton   . CARPAL TUNNEL RELEASE    . COLONOSCOPY     x 3  . EYE SURGERY Left    muscle of the eye   . KNEE SURGERY Bilateral   . LAPAROSCOPIC APPENDECTOMY N/A 01/25/2013   Procedure: APPENDECTOMY LAPAROSCOPIC;  Surgeon: Ralene Ok, MD;  Location: [place];  Service: General;  Laterality: N/A;  . LYMPH NODE DISSECTION Left 04/26/2015   Procedure: LYMPH NODE DISSECTION;  Surgeon: Grace Isaac, MD;  Location: [place];  Service: Thoracic;  Laterality: Left;  . SHOULDER SURGERY Right 2014   Dr.Whitfield rotator cuff surgery  . TEE WITHOUT CARDIOVERSION N/A 04/07/2014   Procedure: TRANSESOPHAGEAL ECHOCARDIOGRAM (TEE);  Surgeon: Candee Furbish, MD;  Location[facility] ENDOSCOPY;  Service: Cardiovascular;  Laterality: N/A;  . VIDEO ASSISTED THORACOSCOPY (VATS)/WEDGE RESECTION Left 04/26/2015   Procedure: LEFT VIDEO ASSISTED THORACOSCOPY (VATS) WITH LEFT LOWER LOBECTOMY;  Surgeon: Grace Isaac, MD;  Location: [place];  Service: Thoracic;  Laterality: Left;  Marland Kitchen VIDEO BRONCHOSCOPY N/A 04/26/2015   Procedure: VIDEO BRONCHOSCOPY;  Surgeon: Grace Isaac, MD;  Location: [place];  Service: Thoracic;  Laterality: N/A;   Social History:   reports that he has been smoking Cigars.  He started smoking about 57 years ago. He has a 25.00 pack-year smoking history. He has quit using smokeless tobacco. His smokeless tobacco use included Chew. He reports that he does not drink alcohol or use drugs.  Family History  Problem Relation Age of Onset  . Irritable bowel syndrome Mother   . Cancer  Neg Hx   . Lung disease Neg Hx   . Colon cancer Neg Hx   . Colon polyps Neg Hx     Medications: Patient's Medications  New Prescriptions   No medications on file  Previous Medications   FLUTICASONE (FLONASE) 50 MCG/ACT NASAL SPRAY    Place 2 sprays into both nostrils at bedtime.   HYDRALAZINE (APRESOLINE) 25 MG TABLET    Take one tablet by mouth three times daily for blood pressure   LOSARTAN (COZAAR) 50 MG TABLET    TAKE 1 TABLET BY MOUTH EVERY DAY FOR BLOOD PRESSURE   METOPROLOL TARTRATE (LOPRESSOR) 25 MG TABLET    TAKE 1/2 TABLET(12.5 MG) BY MOUTH TWICE DAILY  Modified Medications   No medications on file  Discontinued Medications   METOPROLOL TARTRATE (LOPRESSOR) 25 MG TABLET    TAKE 1/2 TABLET(12.5 MG) BY MOUTH TWICE DAILY     Physical Exam:  Vitals:   05/07/16 1308  BP: 132/78  Pulse: 89  Resp: 18  Temp: 98.5 F (36.9 C)  TempSrc: Oral  SpO2: 94%  Weight: 209 lb 12.8 oz (95.2 kg)  Height: '5\' 6"'$  (1.676 m)   Body mass index is 33.86 kg/m.  Physical Exam  Constitutional: He is oriented to person, place, and time. He appears well-developed and well-nourished. No distress.  HENT:  Head: Normocephalic and atraumatic.  Right Ear: External ear normal.  Left Ear: External ear normal.  Nose: Nose normal.  Mouth/Throat: Oropharynx is clear and moist.  Eyes: Conjunctivae are normal. Pupils are equal, round, and reactive to light.  Neck: Normal range of motion. Neck supple.  Cardiovascular: Normal rate, regular rhythm and normal heart sounds.   Pulmonary/Chest: Effort normal and breath sounds normal. No respiratory distress. He has no wheezes.  Abdominal: Soft. Bowel sounds are normal.  Musculoskeletal: He exhibits no edema.  Neurological: He is alert and oriented to person, place, and time. He has normal reflexes.  Skin: Skin is warm and dry. He is not diaphoretic.  Psychiatric: He has a normal mood and affect.    Labs reviewed: Basic Metabolic Panel:  Recent  Labs  06/02/15 2042 02/08/16 0815  NA 141 139  K 4.0 4.4  CL 105 105  CO2 24 21  GLUCOSE 183* 101*  BUN 24* 15  CREATININE 1.03 0.80  CALCIUM 9.3 9.2  TSH  --  1.01   Liver Function Tests:  Recent Labs  06/02/15 2042 02/08/16 0815  AST 28 19  ALT 33 16  ALKPHOS 89 86  BILITOT 0.5 0.6  PROT 6.9 6.7  ALBUMIN 3.9 4.3   No results for input(s): LIPASE, AMYLASE in the last 8760 hours. No results for input(s): AMMONIA in the last 8760 hours. CBC:  Recent Labs  06/02/15 2042 02/08/16 0815  WBC 9.2 7.9  NEUTROABS  --  5,688  HGB 15.3 17.9*  HCT 47.0 51.3*  MCV 97.9 92.8  PLT 256 223   Lipid Panel:  Recent Labs  02/08/16 0815  CHOL 155  HDL 36*  LDLCALC 85  TRIG 170*  CHOLHDL 4.3   TSH:  Recent Labs  02/08/16 0815  TSH 1.01   A1C: Lab Results  Component Value Date   HGBA1C 5.3 07/27/2014     Assessment/Plan 1. Acute maxillary sinusitis, recurrence not specified -feeling better today, most likely viral and encouraged supportive care -mucinex DM BID PO  -Increase hydration -may cont tylenol PRN -PLAIN nasal saline spray PRN -cont flonase per ENT -to call back later this week or seek medical attention if symptoms worsening or fail to improve by the end of the week.    Carlos American. Harle Battiest  [facility] [phone] 8 am - 5 pm) [phone] (after hours)

## 2016-05-10 ENCOUNTER — Telehealth: Payer: Self-pay

## 2016-05-10 NOTE — Telephone Encounter (Signed)
Patient called the triage line because he wanted to let Janett Billow know how he was feeling. Patient stated that he has finished the mucinex samples that were given to him and he is wondering if he should buy more to help clear out the rest of his congestion. Patient states that he is feeling somewhat better but is still coughing some.   Please advise as to whether patient should continue taking mucinex.

## 2016-05-10 NOTE — Telephone Encounter (Signed)
Spoke with patient and he verbalized understanding of these instructions.

## 2016-05-10 NOTE — Telephone Encounter (Signed)
Yes he was to take mucinex DM twice daily for 1 week, increase water intake

## 2016-05-16 ENCOUNTER — Emergency Department (HOSPITAL_COMMUNITY)
Admission: EM | Admit: 2016-05-16 | Discharge: 2016-05-16 | Disposition: A | Discharge status: Home / Self Care | Payer: Medicare HMO | Attending: Emergency Medicine | Admitting: Emergency Medicine

## 2016-05-16 ENCOUNTER — Encounter (HOSPITAL_COMMUNITY): Payer: Self-pay

## 2016-05-16 DIAGNOSIS — Z85118 Personal history of other malignant neoplasm of bronchus and lung: Secondary | ICD-10-CM | POA: Diagnosis not present

## 2016-05-16 DIAGNOSIS — I10 Essential (primary) hypertension: Secondary | ICD-10-CM | POA: Insufficient documentation

## 2016-05-16 DIAGNOSIS — H10022 Other mucopurulent conjunctivitis, left eye: Secondary | ICD-10-CM

## 2016-05-16 DIAGNOSIS — H5712 Ocular pain, left eye: Secondary | ICD-10-CM | POA: Diagnosis present

## 2016-05-16 DIAGNOSIS — F17290 Nicotine dependence, other tobacco product, uncomplicated: Secondary | ICD-10-CM | POA: Diagnosis not present

## 2016-05-16 MED ORDER — TETRACAINE HCL 0.5 % OP SOLN
1.0000 [drp] | Freq: Once | OPHTHALMIC | Status: AC
  Administered 2016-05-16: 1 [drp] via OPHTHALMIC
  Filled 2016-05-16: qty 2

## 2016-05-16 MED ORDER — FLUORESCEIN SODIUM 1 MG OP STRP
1.0000 | ORAL_STRIP | Freq: Once | OPHTHALMIC | Status: AC
  Administered 2016-05-16: 1 via OPHTHALMIC
  Filled 2016-05-16: qty 1

## 2016-05-16 MED ORDER — BACITRACIN-POLYMYXIN B 500-10000 UNIT/GM OP OINT: 1.0000 "application " | TOPICAL_OINTMENT | Freq: Four times a day (QID) | OPHTHALMIC | 0 refills | Status: DC

## 2016-05-16 NOTE — ED Notes (Signed)
20/40 both eyes 20/40 right eye 20/50 left eye

## 2016-05-16 NOTE — ED Triage Notes (Signed)
Patient here with left eye pain and drainage x 1 week. States pain and itching. Blurred in am. Thinks related to URI

## 2016-05-16 NOTE — ED Provider Notes (Signed)
[place] DEPT Provider Note   CSN: [id number] Arrival date & time: 05/16/16  2536  By signing my name below, I, Higinio Plan, attest that this documentation has been prepared under the direction and in the presence of non-physician practitioner, Glendell Docker, NP. Electronically Signed: Higinio Plan, Scribe. 05/16/2016. 9:51 AM.  History   Chief Complaint No chief complaint on file.  The history is provided by the patient. No language interpreter was used.   HPI Comments: [name] is a 75 y.o. male with PMHx of HTN, who presents to the Emergency Department complaining of gradually worsening, 5/10, left eye pain that began 10 days ago and worsened this morning. Pt reports associated drainage from his left eye. He notes he visited his PCP on 10/23 and was prescribed Flonase and Mucinex DM for a URI. 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   Past Medical History:  Diagnosis Date  . A-fib ([place])   . Acute appendicitis with peritoneal abscess 01/28/2013  . Arthritis   . Bacteremia September 2015  . Elevated PSA   . Essential hypertension 04/16/2014  . Heart murmur    childhood  . Ileus, postoperative ([place]) 01/28/2013  . Lung cancer ([place])   . Obesity, morbid ([place]) 01/28/2013  . Shortness of breath   . Tobacco use disorder 01/28/2013    Patient Active Problem List   Diagnosis Date Noted  . 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 Lung cancer, lower lobe ([place])   . Multiple lung nodules on CT 03/02/2015  . Obesity 11/29/2014  . Chronic anticoagulation 08/12/2014  . Atrial fibrillation ([place]) 07/29/2014  . Osteomyelitis ([place]) 04/16/2014  . Dyspnea 04/16/2014  . Physical deconditioning 04/16/2014  . Essential hypertension 04/16/2014  . Leg swelling 04/16/2014  . Loose stools 04/16/2014  . Dehydration 04/01/2014  . Leukocytosis 04/01/2014  . Atrial fibrillation with rapid ventricular response ([place])  04/01/2014  . Abdominal pain, generalized 04/01/2014  . Nausea vomiting and diarrhea 04/01/2014  . Back pain 04/01/2014  . Transaminitis 04/01/2014  . Sepsis ([place]) 04/01/2014  . Thrombocytopenia, unspecified 04/01/2014  . Acute appendicitis with peritoneal abscess 01/28/2013  . Ileus, postoperative ([place]) 01/28/2013  . Tobacco use disorder 01/28/2013  . Obesity, morbid ([place]) 01/28/2013  . IMPACTED CERUMEN 01/29/2008  . EAR PAIN, LEFT 01/29/2008  . URTICARIA 11/10/2007  . GENITAL HERPES, 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    Past Surgical History:  Procedure Laterality Date  . APPENDECTOMY    . [place], 2003   Dr.Apleton   . CARPAL TUNNEL RELEASE    . COLONOSCOPY     x 3  . EYE SURGERY Left    muscle of the eye   . KNEE SURGERY Bilateral   . LAPAROSCOPIC APPENDECTOMY N/A 01/25/2013   Procedure: APPENDECTOMY LAPAROSCOPIC;  Surgeon: Ralene Ok, MD;  Location: [place];  Service: General;  Laterality: N/A;  . LYMPH NODE DISSECTION Left 04/26/2015   Procedure: LYMPH NODE DISSECTION;  Surgeon: Grace Isaac, MD;  Location: [place];  Service: Thoracic;  Laterality: Left;  . SHOULDER SURGERY Right 2014   Dr.Whitfield rotator cuff surgery  . TEE WITHOUT CARDIOVERSION N/A 04/07/2014   Procedure: TRANSESOPHAGEAL ECHOCARDIOGRAM (TEE);  Surgeon: Candee Furbish, MD;  Location: [facility] ENDOSCOPY;  Service: Cardiovascular;  Laterality: N/A;  . VIDEO ASSISTED THORACOSCOPY (VATS)/WEDGE RESECTION Left 04/26/2015   Procedure: LEFT VIDEO ASSISTED THORACOSCOPY (VATS) WITH LEFT LOWER  LOBECTOMY;  Surgeon: Grace Isaac, MD;  Location: [place];  Service: Thoracic;  Laterality: Left;  Marland Kitchen VIDEO BRONCHOSCOPY N/A 04/26/2015   Procedure: VIDEO BRONCHOSCOPY;  Surgeon: Grace Isaac, MD;  Location: [facility] OR;  Service: Thoracic;  Laterality: N/A;    Home Medications    Prior to Admission medications   Medication Sig Start Date End Date Taking? Authorizing Provider  fluticasone (FLONASE) 50 MCG/ACT nasal spray Place  2 sprays into both nostrils at bedtime. 05/03/16   Historical Provider, MD  hydrALAZINE (APRESOLINE) 25 MG tablet Take one tablet by mouth three times daily for blood pressure 03/12/16   Jerline Pain, MD  losartan (COZAAR) 50 MG tablet TAKE 1 TABLET BY MOUTH EVERY DAY FOR BLOOD PRESSURE 04/25/16   [name], NP  metoprolol tartrate (LOPRESSOR) 25 MG tablet TAKE 1/2 TABLET(12.5 MG) BY MOUTH TWICE DAILY 09/28/15   [name], NP    Family History Family History  Problem Relation Age of Onset  . Irritable bowel syndrome Mother   . Cancer Neg Hx   . Lung disease Neg Hx   . Colon cancer Neg Hx   . Colon polyps Neg Hx     Social History Social History  Substance Use Topics  . Smoking status: Current Every Day Smoker    Packs/day: 0.50    Years: 50.00    Types: Cigars    Start date: 07/16/1958  . Smokeless tobacco: Former Systems developer    Types: Chew  . Alcohol use No     Allergies   Asa [aspirin]; Nsaids; and Naproxen   Review of Systems Review of Systems  Eyes: Positive for pain (left eye) and discharge.  All other systems reviewed and are negative.  Physical Exam Updated Vital Signs BP 146/61 (BP Location: Right Arm)   Pulse 72   Temp 98.3 F (36.8 C) (Oral)   Resp 22   Ht '5\' 6"'$  (1.676 m)   Wt 203 lb (92.1 kg)   SpO2 96%   BMI 32.77 kg/m   Physical Exam  Constitutional: He is oriented to person, place, and time. He appears well-developed and well-nourished.  HENT:  Head: Normocephalic.  Eyes: Conjunctivae and EOM are normal. Pupils are equal, round, and reactive to light. Left eye exhibits discharge. Left eye exhibits no nystagmus.  Fundoscopic exam:      The left eye shows no papilledema.  Slit lamp exam:      The left eye shows no corneal abrasion, no corneal ulcer and no foreign body.  Eye pressure within normal limits. Drainage noted from the eye. Mild redness noted to the eye lid  Neck: Normal range of motion.  Pulmonary/Chest: Effort normal.    Abdominal: He exhibits no distension.  Musculoskeletal: Normal range of motion.  Neurological: He is alert and oriented to person, place, and time.  Psychiatric: He has a normal mood and affect.  Nursing note and vitals reviewed.  ED Treatments / Results  Labs (all labs ordered are listed, but only abnormal results are displayed) Labs Reviewed - No data to display  EKG  EKG Interpretation None       Radiology No results found.  Procedures Procedures (including critical care time)  Medications Ordered in ED Medications - No data to display  DIAGNOSTIC STUDIES:  Oxygen Saturation is 96% on RA, normal by my interpretation.    COORDINATION OF CARE:  9:50 AM Discussed treatment plan with pt at bedside and pt agreed to plan.  Initial Impression /  Assessment and Plan / ED Course  I have reviewed the triage vital signs and the nursing notes.  Pertinent labs & imaging results that were available during my care of the patient were reviewed by me and considered in my medical decision making (see chart for details).  Clinical Course    Normal pressure. Likely conjunctivitis. Given ointment and told to call the opthomologist for continued symptoms  I personally performed the services described in this documentation, which was scribed in my presence. The recorded information has been reviewed and is accurate.   Final Clinical Impressions(s) / ED Diagnoses   Final diagnoses:  None    New Prescriptions New Prescriptions   No medications on file     Glendell Docker, NP 05/16/16 [place], MD 05/28/16 1549

## 2016-05-31 ENCOUNTER — Other Ambulatory Visit: Payer: Self-pay | Admitting: Internal Medicine

## 2016-07-18 DIAGNOSIS — H01024 Squamous blepharitis left upper eyelid: Secondary | ICD-10-CM | POA: Diagnosis not present

## 2016-07-18 DIAGNOSIS — H01021 Squamous blepharitis right upper eyelid: Secondary | ICD-10-CM | POA: Diagnosis not present

## 2016-07-18 DIAGNOSIS — H01022 Squamous blepharitis right lower eyelid: Secondary | ICD-10-CM | POA: Diagnosis not present

## 2016-07-18 DIAGNOSIS — H01025 Squamous blepharitis left lower eyelid: Secondary | ICD-10-CM | POA: Diagnosis not present

## 2016-07-18 DIAGNOSIS — H2513 Age-related nuclear cataract, bilateral: Secondary | ICD-10-CM | POA: Diagnosis not present

## 2016-08-03 ENCOUNTER — Encounter: Payer: Self-pay | Admitting: Internal Medicine

## 2016-08-03 ENCOUNTER — Ambulatory Visit (INDEPENDENT_AMBULATORY_CARE_PROVIDER_SITE_OTHER): Payer: PPO | Admitting: Internal Medicine

## 2016-08-03 VITALS — BP 138/74 | HR 71 | Temp 98.1°F | Ht 66.0 in | Wt 217.0 lb

## 2016-08-03 DIAGNOSIS — Z85118 Personal history of other malignant neoplasm of bronchus and lung: Secondary | ICD-10-CM

## 2016-08-03 DIAGNOSIS — Z902 Acquired absence of lung [part of]: Secondary | ICD-10-CM | POA: Diagnosis not present

## 2016-08-03 DIAGNOSIS — Z720 Tobacco use: Secondary | ICD-10-CM

## 2016-08-03 DIAGNOSIS — R600 Localized edema: Secondary | ICD-10-CM | POA: Diagnosis not present

## 2016-08-03 DIAGNOSIS — R972 Elevated prostate specific antigen [PSA]: Secondary | ICD-10-CM | POA: Diagnosis not present

## 2016-08-03 DIAGNOSIS — I480 Paroxysmal atrial fibrillation: Secondary | ICD-10-CM | POA: Diagnosis not present

## 2016-08-03 DIAGNOSIS — I10 Essential (primary) hypertension: Secondary | ICD-10-CM | POA: Diagnosis not present

## 2016-08-03 DIAGNOSIS — R350 Frequency of micturition: Secondary | ICD-10-CM | POA: Diagnosis not present

## 2016-08-03 LAB — CBC WITH DIFFERENTIAL/PLATELET
Basophils Absolute: 0 cells/uL (ref 0–200)
Basophils Relative: 0 %
Eosinophils Absolute: 160 cells/uL (ref 15–500)
Eosinophils Relative: 2 %
HCT: 50.7 % — ABNORMAL HIGH (ref 38.5–50.0)
HEMOGLOBIN: 17.3 g/dL — AB (ref 13.2–17.1)
LYMPHS ABS: 1840 {cells}/uL (ref 850–3900)
Lymphocytes Relative: 23 %
MCH: 32.5 pg (ref 27.0–33.0)
MCHC: 34.1 g/dL (ref 32.0–36.0)
MCV: 95.1 fL (ref 80.0–100.0)
MPV: 10.6 fL (ref 7.5–12.5)
Monocytes Absolute: 640 cells/uL (ref 200–950)
Monocytes Relative: 8 %
NEUTROS PCT: 67 %
Neutro Abs: 5360 cells/uL (ref 1500–7800)
Platelets: 222 10*3/uL (ref 140–400)
RBC: 5.33 MIL/uL (ref 4.20–5.80)
RDW: 14.5 % (ref 11.0–15.0)
WBC: 8 10*3/uL (ref 3.8–10.8)

## 2016-08-03 LAB — COMPLETE METABOLIC PANEL WITH GFR
ALT: 19 U/L (ref 9–46)
AST: 23 U/L (ref 10–35)
Albumin: 4.3 g/dL (ref 3.6–5.1)
Alkaline Phosphatase: 82 U/L (ref 40–115)
BUN: 14 mg/dL (ref 7–25)
CHLORIDE: 107 mmol/L (ref 98–110)
CO2: 28 mmol/L (ref 20–31)
CREATININE: 0.85 mg/dL (ref 0.70–1.18)
Calcium: 9.7 mg/dL (ref 8.6–10.3)
GFR, Est African American: >89 mL/min (ref >=60)
GFR, Est Non African American: 86 mL/min (ref >=60)
Glucose, Bld: 81 mg/dL (ref 65–99)
Potassium: 5 mmol/L (ref 3.5–5.3)
Sodium: 140 mmol/L (ref 135–146)
Total Bilirubin: 0.4 mg/dL (ref 0.2–1.2)
Total Protein: 7.3 g/dL (ref 6.1–8.1)

## 2016-08-03 LAB — PSA: PSA: 3.3 ng/mL (ref <=4.0)

## 2016-08-03 MED ORDER — FUROSEMIDE 20 MG PO TABS: 20.0000 mg | ORAL_TABLET | Freq: Every day | ORAL | 3 refills | Status: DC | PRN

## 2016-08-03 NOTE — Patient Instructions (Signed)
Takes furosemide daily x 3 days then use as needed for swelling  Continue other medications as ordered  Will call with heart [place] appt  Follow up with Dr Servando Snare as scheduled  Smoking cessation discussed and highly urged  Will call with lab results  Follow up as scheduled

## 2016-08-03 NOTE — Progress Notes (Signed)
Patient ID: [name], male   DOB: 1940/07/19, 76 y.o.   MRN: [id number]    Location:  [facility] Place of Service: OFFICE  Chief Complaint  Patient presents with  . Acute Visit    Foot problem (Both) swelling and no pain started in the left foot about 4 to 5 weeks ago    HPI:  76 yo male seen today for LE swelling x several weeks L>R but worse over the last week. Improves overnight but gets worse as the day progresses. He has numbness/tingling in feet. He has a hx afib, arthritis, HTN and lung CA s/p lower lobectomy. He is due to see cardiothoracic sx Dr Servando Snare next month along with CT chest.   He still smokes cigarettes.  Left ear sx's improved since he began using simus rinse system.    Past Medical History:  Diagnosis Date  . A-fib ([place])   . Acute appendicitis with peritoneal abscess 01/28/2013  . Arthritis   . Bacteremia September 2015  . Elevated PSA   . Essential hypertension 04/16/2014  . Heart murmur    childhood  . Ileus, postoperative ([place]) 01/28/2013  . Lung cancer ([place])   . Obesity, morbid ([place]) 01/28/2013  . Shortness of breath   . Tobacco use disorder 01/28/2013    Past Surgical History:  Procedure Laterality Date  . APPENDECTOMY    . [place], 2003   Dr.Apleton   . CARPAL TUNNEL RELEASE    . COLONOSCOPY     x 3  . EYE SURGERY Left    muscle of the eye   . KNEE SURGERY Bilateral   . LAPAROSCOPIC APPENDECTOMY N/A 01/25/2013   Procedure: APPENDECTOMY LAPAROSCOPIC;  Surgeon: Ralene Ok, MD;  Location: [place];  Service: General;  Laterality: N/A;  . LYMPH NODE DISSECTION Left 04/26/2015   Procedure: LYMPH NODE DISSECTION;  Surgeon: Grace Isaac, MD;  Location: [place];  Service: Thoracic;  Laterality: Left;  . SHOULDER SURGERY Right 2014   Dr.Whitfield rotator cuff surgery  . TEE WITHOUT CARDIOVERSION N/A 04/07/2014   Procedure: TRANSESOPHAGEAL ECHOCARDIOGRAM (TEE);  Surgeon: Candee Furbish, MD;  Location: [facility] ENDOSCOPY;  Service:  Cardiovascular;  Laterality: N/A;  . VIDEO ASSISTED THORACOSCOPY (VATS)/WEDGE RESECTION Left 04/26/2015   Procedure: LEFT VIDEO ASSISTED THORACOSCOPY (VATS) WITH LEFT LOWER LOBECTOMY;  Surgeon: Grace Isaac, MD;  Location: [place];  Service: Thoracic;  Laterality: Left;  Marland Kitchen VIDEO BRONCHOSCOPY N/A 04/26/2015   Procedure: VIDEO BRONCHOSCOPY;  Surgeon: Grace Isaac, MD;  Location: [place];  Service: Thoracic;  Laterality: N/A;    Patient Care Team: [name], NP as PCP - General (Nurse Practitioner) Druscilla Brownie, MD as Consulting Physician (Dermatology)  Social History   Social History  . Marital status: Divorced    Spouse name: N/A  . Number of children: 0  . Years of education: N/A   Occupational History  . Retired    Social History Main Topics  . Smoking status: Current Every Day Smoker    Packs/day: 0.50    Years: 50.00    Types: Cigars    Start date: 07/16/1958  . Smokeless tobacco: Former Systems developer    Types: Chew  . Alcohol use No  . Drug use: No  . Sexual activity: Not Currently   Other Topics Concern  . Not on file   Social History Narrative   No diet   Yes, eats/drinks things with caffeine    Divorced, married 1979   Lives in  a house, one stories , one person, no pets   Current/past profession- [employer]   Patient exercises, golf 3 days weekly        Originally from [place]. Previously lived in [place] [place] for [place] years and moved back to [place] in 1968. No international travel. He has traveled through multiple states traveling to [place]. 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 Unsure if he was exposed to any asbestos. No mold exposure. No pets currently. Previously has owned dogs. Parakeet as a child. No hot tub exposure. Enjoys golfing.     reports that he has been smoking Cigars.  He started smoking about 58 years ago. He has a 25.00 pack-year smoking history. He has quit using smokeless  tobacco. His smokeless tobacco use included Chew. He reports that he does not drink alcohol or use drugs.  Family History  Problem Relation Age of Onset  . Irritable bowel syndrome Mother   . Cancer Neg Hx   . Lung disease Neg Hx   . Colon cancer Neg Hx   . Colon polyps Neg Hx    Family Status  Relation Status  . Mother Deceased  . Father Deceased  . Maternal Grandmother Deceased  . Maternal Grandfather Deceased  . Paternal Grandmother Deceased  . Paternal Grandfather Deceased  . Neg Hx      Allergies  Allergen Reactions  . Asa [Aspirin] Anaphylaxis and Swelling    Angioedema/Eyes and lip swelling  . Nsaids Hives  . Naproxen Hives    Medications: Patient's Medications  New Prescriptions   No medications on file  Previous Medications   HYDRALAZINE (APRESOLINE) 25 MG TABLET    Take one tablet by mouth three times daily for blood pressure   LOSARTAN (COZAAR) 50 MG TABLET    TAKE 1 TABLET BY MOUTH EVERY DAY FOR BLOOD PRESSURE   METOPROLOL TARTRATE (LOPRESSOR) 25 MG TABLET    TAKE 1/2 TABLET(12.5 MG) BY MOUTH TWICE DAILY  Modified Medications   No medications on file  Discontinued Medications   BACITRACIN-POLYMYXIN B (POLYSPORIN) OPHTHALMIC OINTMENT    Place 1 application into the left eye 4 (four) times daily. apply to eye every 12 hours while awake   FLUTICASONE (FLONASE) 50 MCG/ACT NASAL SPRAY    Place 2 sprays into both nostrils at bedtime.    Review of Systems  HENT: Positive for sinus pressure.   Cardiovascular: Positive for leg swelling.  Musculoskeletal: Positive for arthralgias.  All other systems reviewed and are negative.   Vitals:   08/03/16 1352  BP: 138/74  Pulse: 71  Temp: 98.1 F (36.7 C)  TempSrc: Oral  SpO2: 96%  Weight: 217 lb (98.4 kg)  Height: '5\' 6"'  (1.676 m)   Body mass index is 35.02 kg/m.  Physical Exam  Constitutional: He is oriented to person, place, and time. He appears well-developed and well-nourished.  Neck: Neck supple.  Carotid bruit is not present.  Cardiovascular: Normal rate, regular rhythm and intact distal pulses.  Exam reveals no gallop and no friction rub.   Murmur (1/6 SEM) heard. Pulses:      Popliteal pulses are 2+ on the right side, and 2+ on the left side.       Dorsalis pedis pulses are 2+ on the right side, and 2+ on the left side.       Posterior tibial pulses are 2+ on the right side, and 2+ on the left side.  R>L +1  pitting LE edema. No calf TTP. No appreciable varicose veins  Pulmonary/Chest: Effort normal. He has wheezes (occasional end expiratory wheeze). He has no rales. He exhibits no tenderness.  Musculoskeletal: He exhibits edema, tenderness and deformity (left foot).  Neurological: He is alert and oriented to person, place, and time. He has normal reflexes.  Skin: Skin is warm and dry. No rash noted.  Psychiatric: He has a normal mood and affect. His behavior is normal. Judgment and thought content normal.     Labs reviewed: No visits with results within 3 Month(s) from this visit.  Latest known visit with results is:  Appointment on 02/08/2016  Component Date Value Ref Range Status  . Sodium 02/08/2016 139  135 - 146 mmol/L Final  . Potassium 02/08/2016 4.4  3.5 - 5.3 mmol/L Final  . Chloride 02/08/2016 105  98 - 110 mmol/L Final  . CO2 02/08/2016 21  20 - 31 mmol/L Final  . Glucose, Bld 02/08/2016 101* 65 - 99 mg/dL Final  . BUN 02/08/2016 15  7 - 25 mg/dL Final  . Creat 02/08/2016 0.80  0.70 - 1.18 mg/dL Final   Comment:   For patients > or = 74 years of age: The upper reference limit for Creatinine is approximately 13% higher for people identified as African-American.     . Total Bilirubin 02/08/2016 0.6  0.2 - 1.2 mg/dL Final  . Alkaline Phosphatase 02/08/2016 86  40 - 115 U/L Final  . AST 02/08/2016 19  10 - 35 U/L Final  . ALT 02/08/2016 16  9 - 46 U/L Final  . Total Protein 02/08/2016 6.7  6.1 - 8.1 g/dL Final  . Albumin 02/08/2016 4.3  3.6 - 5.1 g/dL Final  .  Calcium 02/08/2016 9.2  8.6 - 10.3 mg/dL Final  . WBC 02/08/2016 7.9  3.8 - 10.8 K/uL Final  . RBC 02/08/2016 5.53  4.20 - 5.80 MIL/uL Final  . Hemoglobin 02/08/2016 17.9* 13.2 - 17.1 g/dL Final  . HCT 02/08/2016 51.3* 38.5 - 50.0 % Final  . MCV 02/08/2016 92.8  80.0 - 100.0 fL Final  . MCH 02/08/2016 32.4  27.0 - 33.0 pg Final  . MCHC 02/08/2016 34.9  32.0 - 36.0 g/dL Final  . RDW 02/08/2016 14.5  11.0 - 15.0 % Final  . Platelets 02/08/2016 223  140 - 400 K/uL Final  . MPV 02/08/2016 11.4  7.5 - 12.5 fL Final  . Neutro Abs 02/08/2016 5688  1,500 - 7,800 cells/uL Final  . Lymphs Abs 02/08/2016 1501  850 - 3,900 cells/uL Final  . Monocytes Absolute 02/08/2016 632  200 - 950 cells/uL Final  . Eosinophils Absolute 02/08/2016 79  15 - 500 cells/uL Final  . Basophils Absolute 02/08/2016 0  0 - 200 cells/uL Final  . Neutrophils Relative % 02/08/2016 72  % Final  . Lymphocytes Relative 02/08/2016 19  % Final  . Monocytes Relative 02/08/2016 8  % Final  . Eosinophils Relative 02/08/2016 1  % Final  . Basophils Relative 02/08/2016 0  % Final  . Smear Review 02/08/2016 Criteria for review not met   Final  . Cholesterol 02/08/2016 155  125 - 200 mg/dL Final  . Triglycerides 02/08/2016 170* <150 mg/dL Final  . HDL 02/08/2016 36* >=40 mg/dL Final  . Total CHOL/HDL Ratio 02/08/2016 4.3  <=5.0 Ratio Final  . VLDL 02/08/2016 34* <30 mg/dL Final  . LDL Cholesterol 02/08/2016 85  <130 mg/dL Final   Comment:   Total Cholesterol/HDL Ratio:CHD Risk  Coronary Heart Disease Risk Table                                        Men       Women          1/2 Average Risk              3.4        3.3              Average Risk              5.0        4.4           2X Average Risk              9.6        7.1           3X Average Risk             23.4       11.0 Use the calculated Patient Ratio above and the CHD Risk table  to determine the patient's CHD Risk.   Marland Kitchen TSH 02/08/2016 1.01  0.40  - 4.50 mIU/L Final  . PSA 02/08/2016 4.70* <=4.00 ng/mL Final   Comment: Test Methodology: ECLIA PSA (Electrochemiluminescence Immunoassay)   For PSA values from 2.5-4.0, particularly in younger men <55 years old, the AUA and NCCN suggest testing for % Free PSA (3515) and evaluation of the rate of increase in PSA (PSA velocity).     No results found.   Assessment/Plan   ICD-9-CM ICD-10-CM   1. Bilateral leg edema 782.3 R60.0 CBC with Differential/Platelets     CMP with eGFR     Urinalysis with Reflex Microscopic     ECHOCARDIOGRAM COMPLETE BUBBLE STUDY     furosemide (LASIX) 20 MG tablet  2. Elevated PSA 790.93 R97.20 PSA  3. S/P lobectomy of lung V45.89 Z90.2   4. History of lung cancer V10.11 Z85.118 CBC with Differential/Platelets     CMP with eGFR  5. Paroxysmal atrial fibrillation (HCC) 427.31 I48.0 ECHOCARDIOGRAM COMPLETE BUBBLE STUDY  6. Tobacco abuse 305.1 Z72.0   7. Essential hypertension 401.9 I10 ECHOCARDIOGRAM COMPLETE BUBBLE STUDY  8. Urinary frequency 788.41 R35.0 Urinalysis with Reflex Microscopic     PSA   Take furosemide daily x 3 days then use as needed for swelling  Continue other medications as ordered  Will call with heart [place] appt  Follow up with Dr Servando Snare as scheduled  Smoking cessation discussed and highly urged  Will call with lab results  Reduce coffee amt to 2 cups per day  Follow up as scheduled  [place]. Perlie Gold  [facility] [address] [place],  [address] [phone] Cell (Monday-Friday 8 AM - 5 PM) [phone] After 5 PM and follow prompts

## 2016-08-04 LAB — URINALYSIS, ROUTINE W REFLEX MICROSCOPIC
BILIRUBIN URINE: NEGATIVE
Glucose, UA: NEGATIVE
Hgb urine dipstick: NEGATIVE
KETONES UR: NEGATIVE
Leukocytes, UA: NEGATIVE
NITRITE: NEGATIVE
PH: 6 (ref 5.0–8.0)
Protein, ur: NEGATIVE
SPECIFIC GRAVITY, URINE: 1.01 (ref 1.001–1.035)

## 2016-08-08 IMAGING — CR DG CHEST 1V PORT
1 series · 1 of 1 positions shown · non-contrast
Comparison: April 26, 2015.

CLINICAL DATA: Chest tube placement status post video-assisted
thoracic surgery.

EXAM:
PORTABLE CHEST 1 VIEW

[AP]
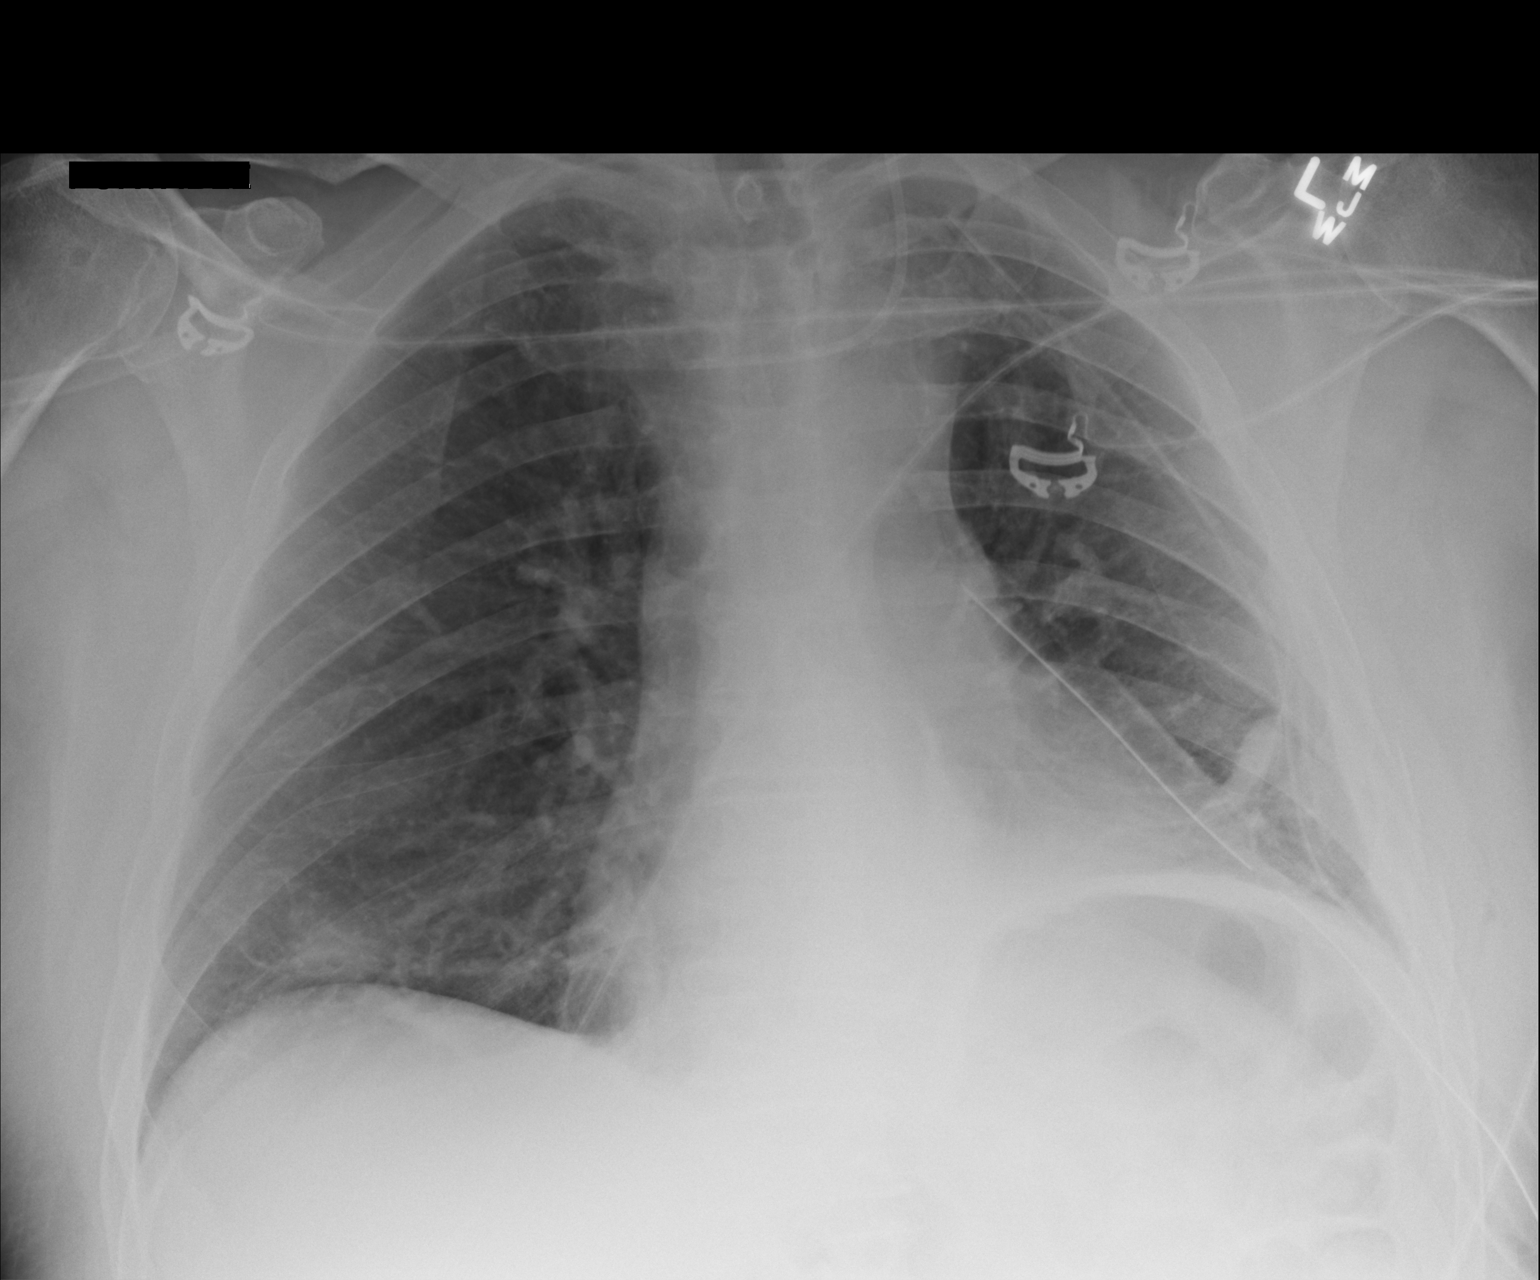

[1 of 1 positions shown; findings below may reference images not displayed]

FINDINGS: Stable cardiomediastinal silhouette. Two left-sided chest tubes are
noted without evidence of pneumothorax. Stable left basilar scarring
or atelectasis is noted. Elevated left hemidiaphragm is noted. No
significant pleural effusion is noted. New mild right basilar
opacity is noted most consistent with subsegmental atelectasis.
IMPRESSION: Stable position of 2 left-sided chest tubes without evidence of
pneumothorax. New mild right basilar subsegmental atelectasis.

## 2016-08-09 DIAGNOSIS — H9202 Otalgia, left ear: Secondary | ICD-10-CM | POA: Diagnosis not present

## 2016-08-10 ENCOUNTER — Other Ambulatory Visit: Payer: Self-pay | Admitting: Cardiothoracic Surgery

## 2016-08-10 DIAGNOSIS — R911 Solitary pulmonary nodule: Secondary | ICD-10-CM

## 2016-08-10 IMAGING — CR DG CHEST 1V PORT
1 series · 1 of 1 positions shown · non-contrast
Comparison: 04/28/2015.

CLINICAL DATA: Vats.

EXAM:
PORTABLE CHEST 1 VIEW

[AP]
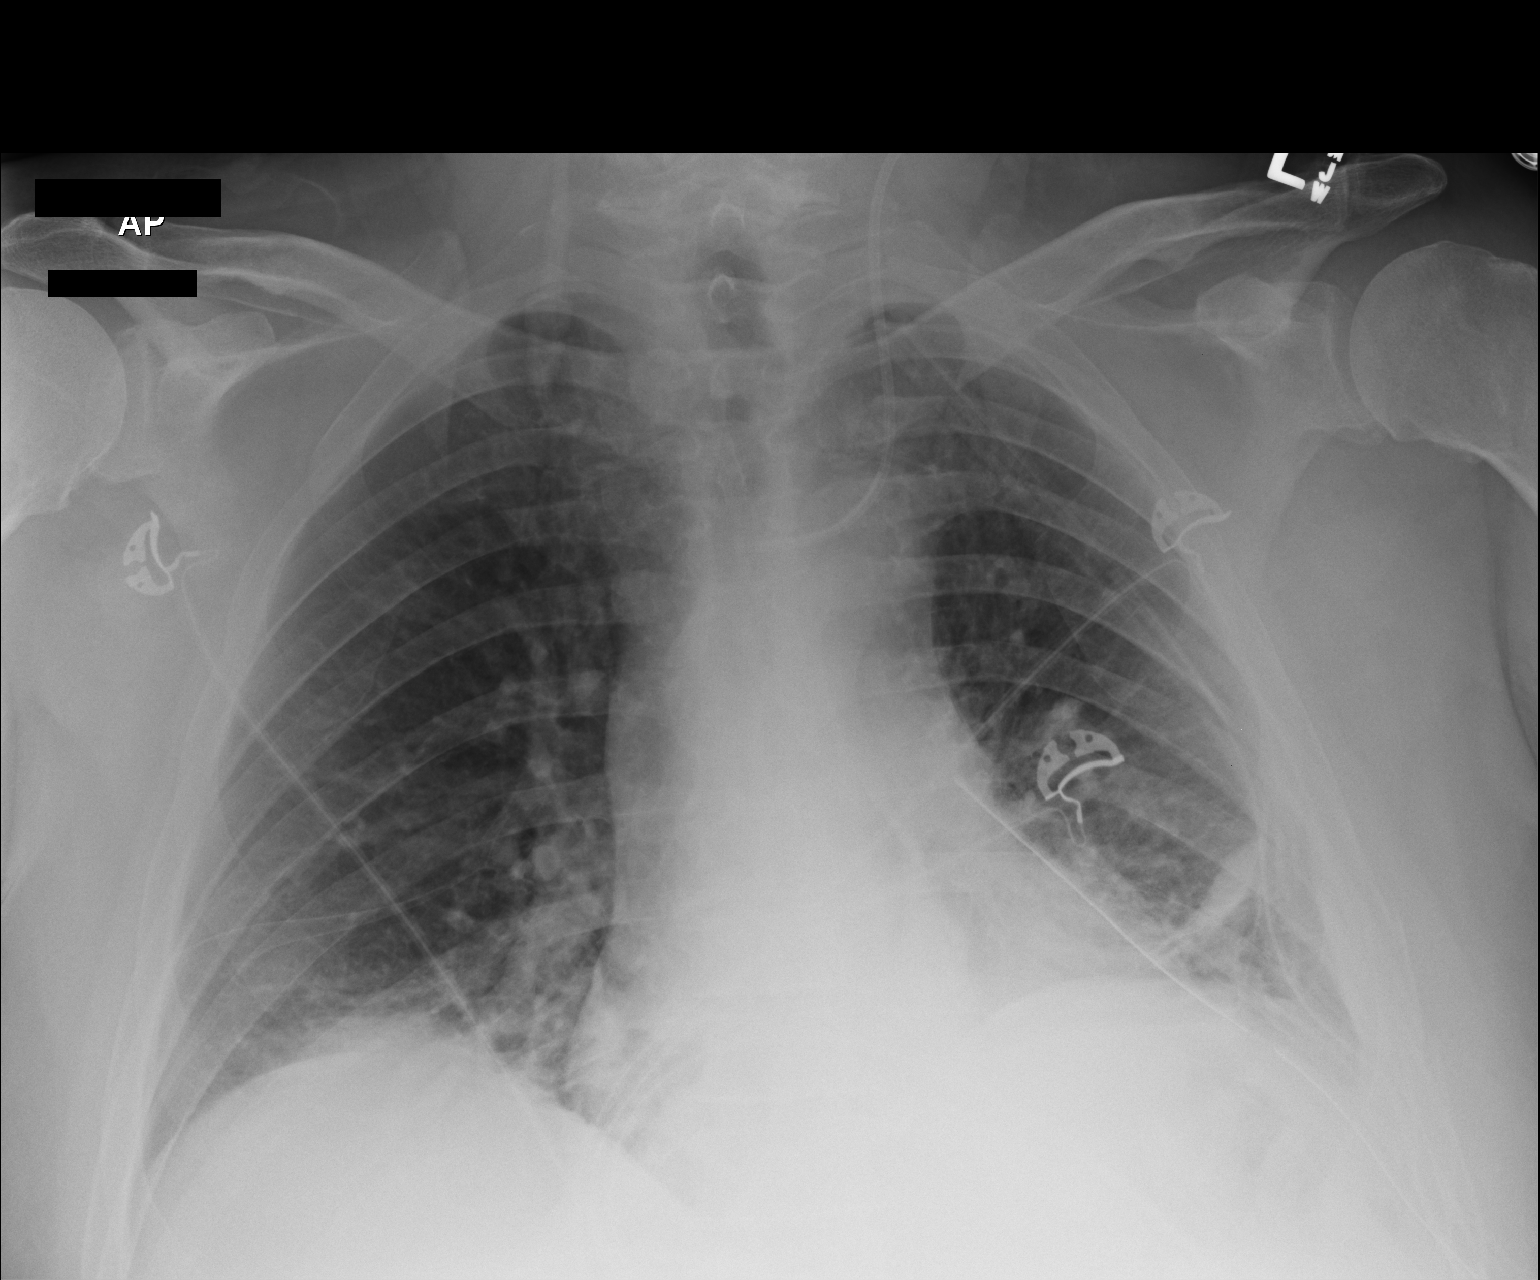

[1 of 1 positions shown; findings below may reference images not displayed]

FINDINGS: Left IJ line and left chest tubes in stable position . Cardiomegaly.
Bibasilar atelectasis and/or infiltrates. No pleural effusion or
pneumothorax.
IMPRESSION: 1. Left IJ line and 2 left chest tubes in stable position. No
pneumothorax.
2. Persistent bibasilar atelectasis and/or infiltrates.

## 2016-08-13 IMAGING — CR DG CHEST 2V
2 series · 2 of 2 positions shown · non-contrast
Comparison: 05/01/2015

CLINICAL DATA: Lung carcinoma.  Postop from left lung resection.

EXAM:
CHEST  2 VIEW

[chest pa]
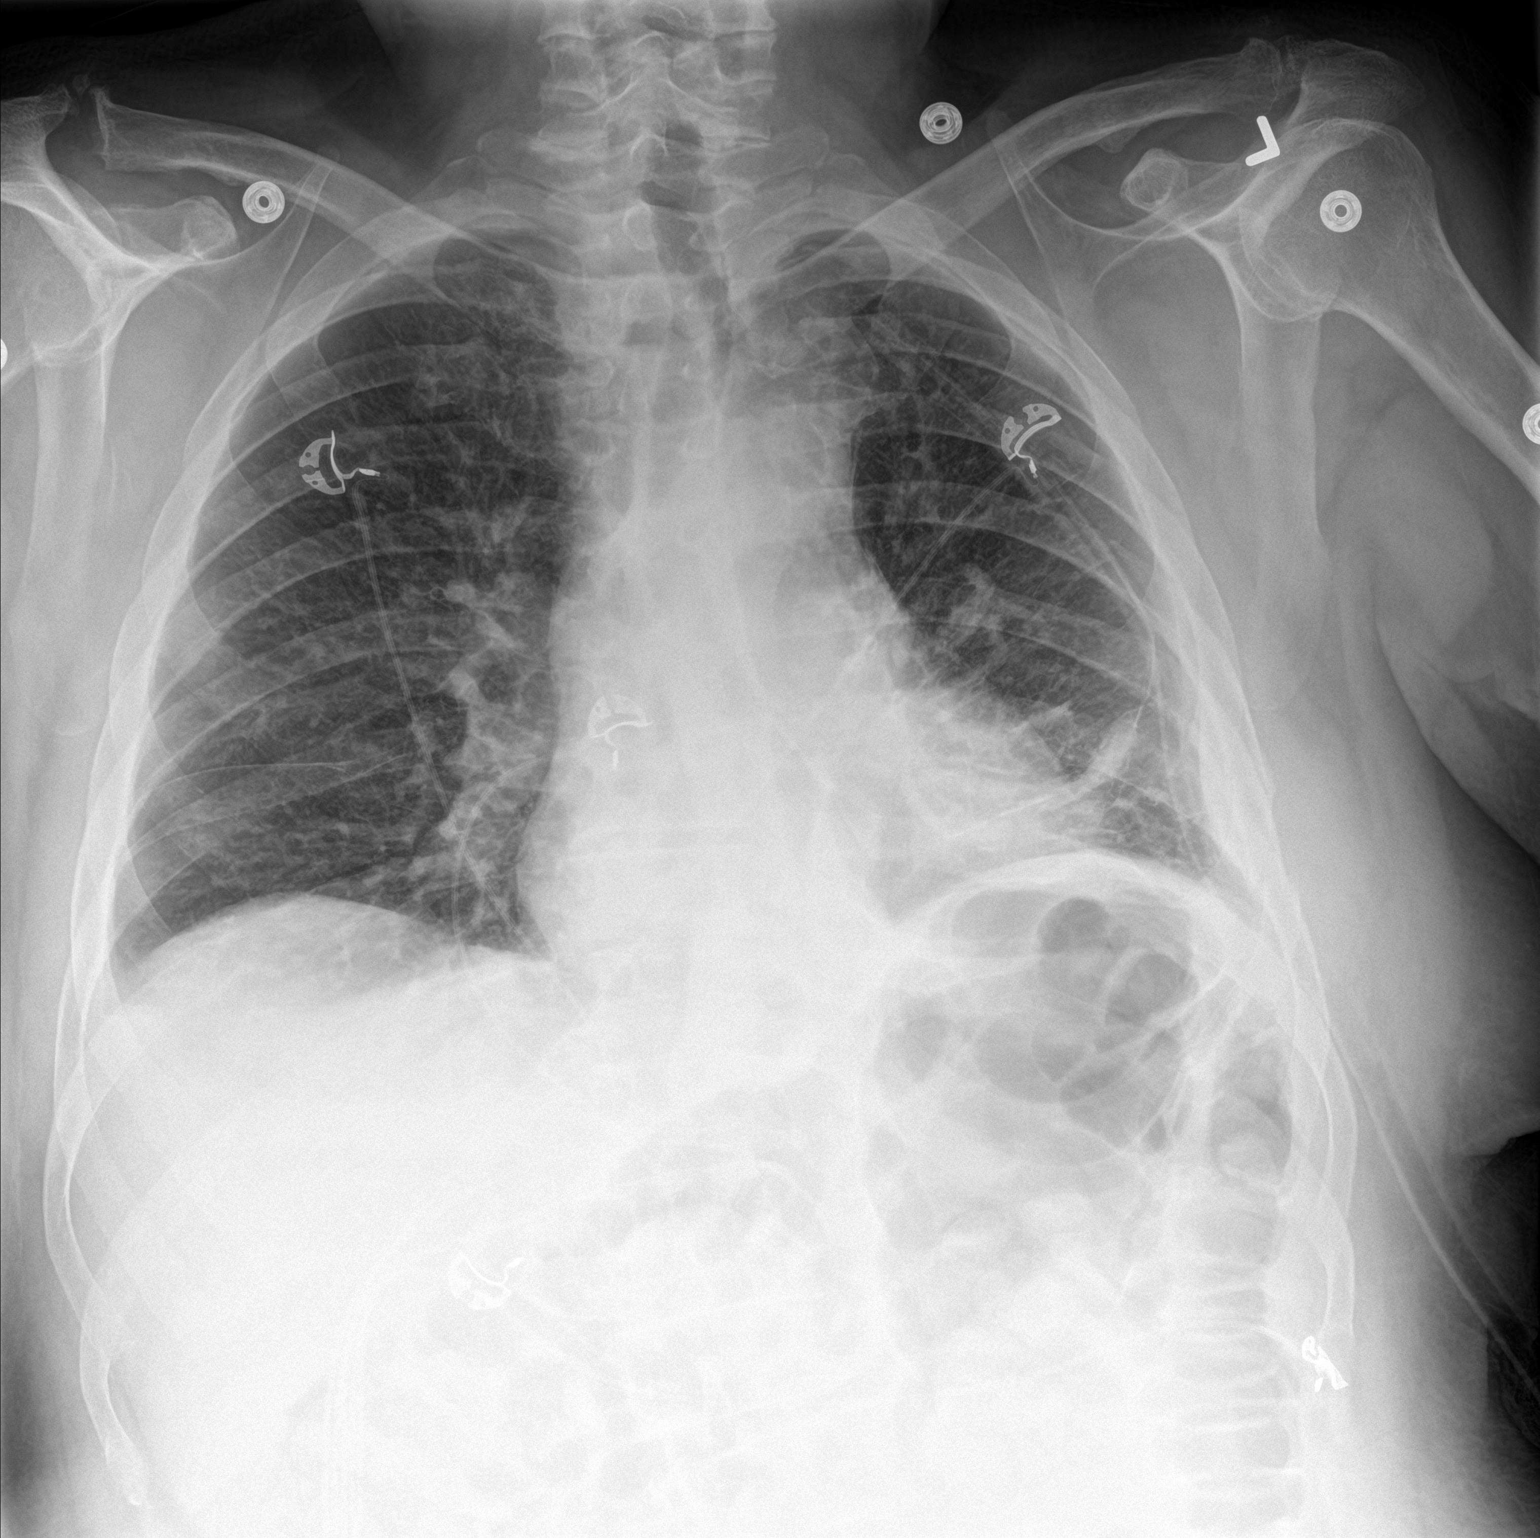

[chest lat]
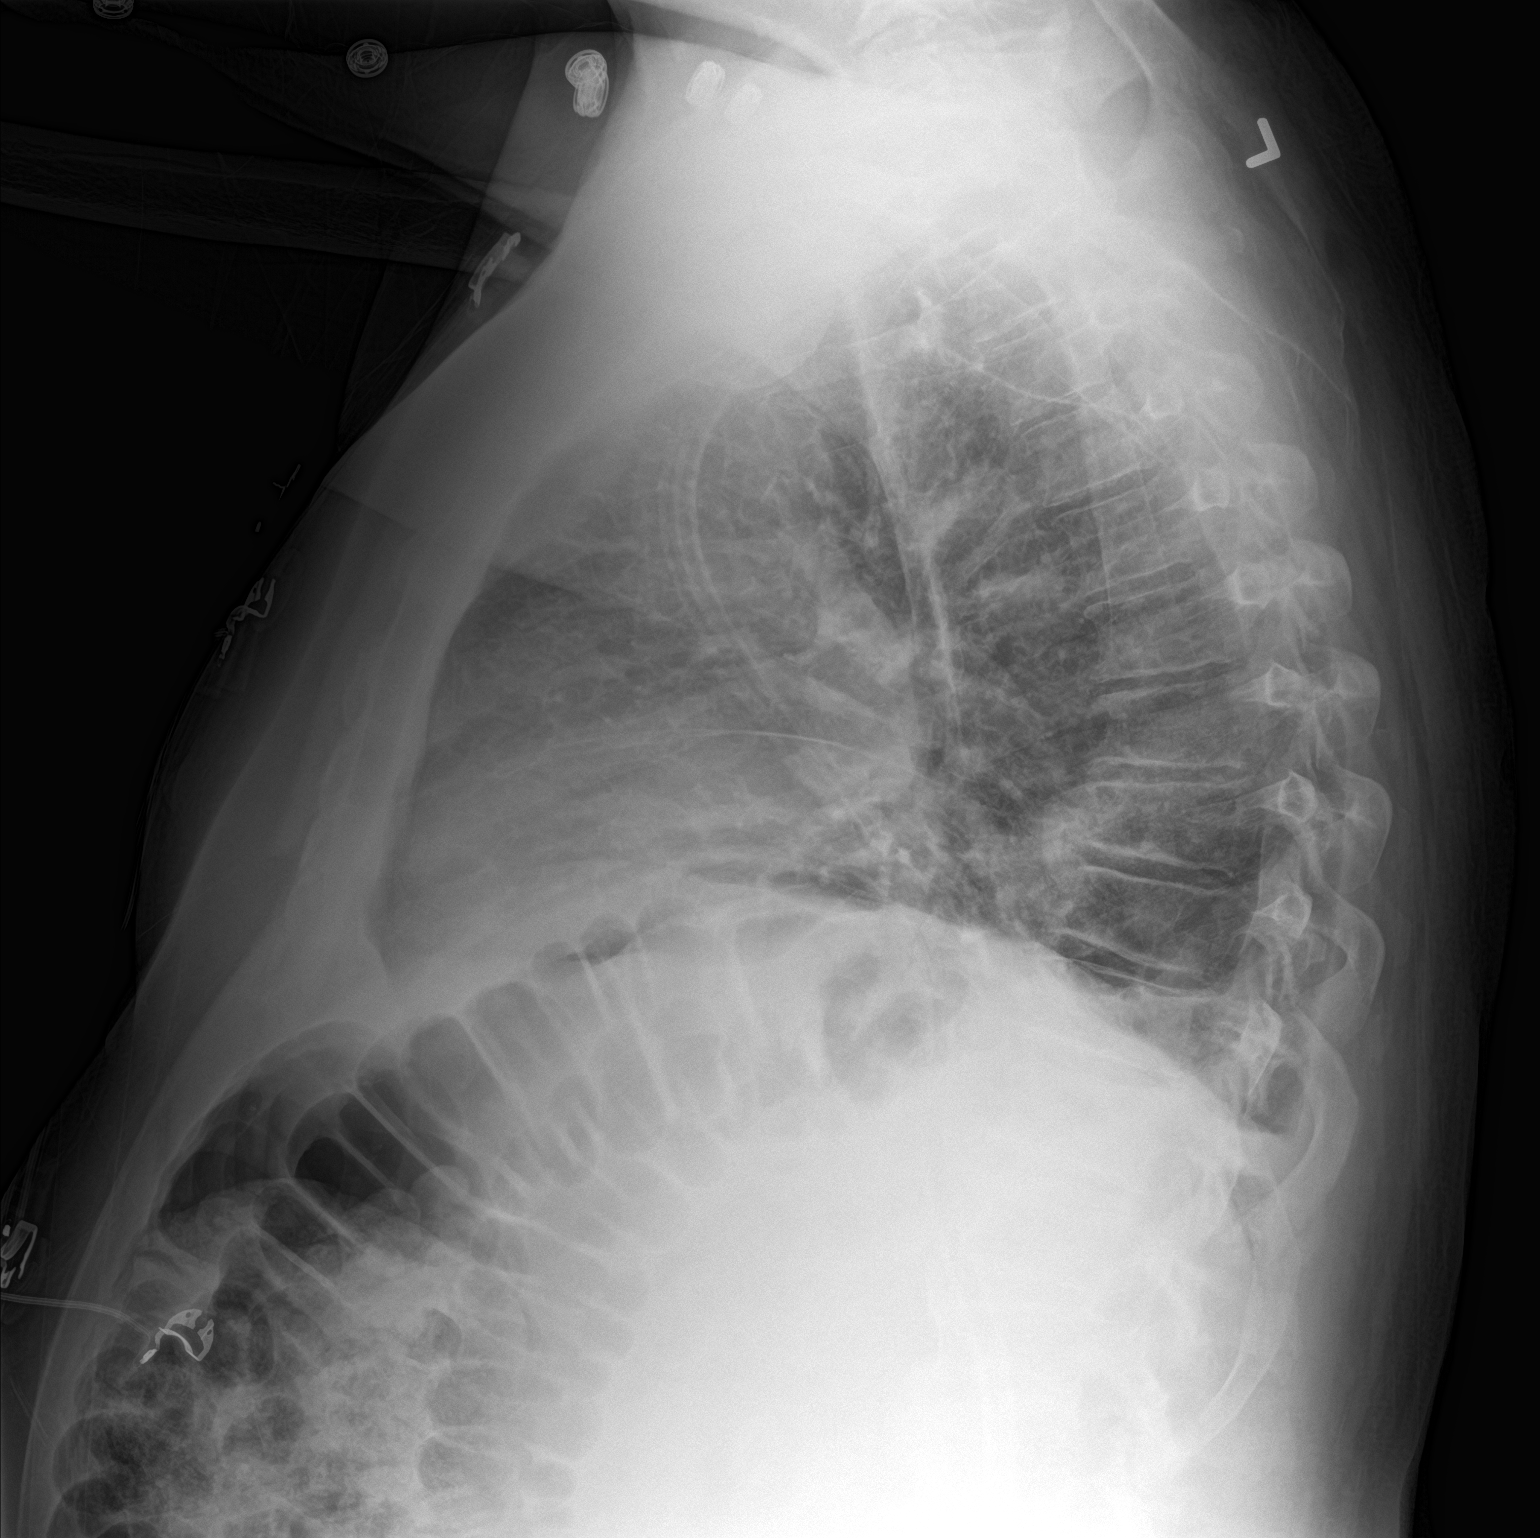

[2 of 2 positions shown; findings below may reference images not displayed]

FINDINGS: Left-sided chest tube remains in place. No pneumothorax visualized.
Left basilar atelectasis shows no significant change. Right lung
remains clear. Heart size within normal limits.
IMPRESSION: Stable mild left basilar atelectasis.  No pneumothorax visualized.

## 2016-08-16 ENCOUNTER — Ambulatory Visit: Payer: Medicare HMO | Admitting: Internal Medicine

## 2016-08-20 ENCOUNTER — Ambulatory Visit (INDEPENDENT_AMBULATORY_CARE_PROVIDER_SITE_OTHER): Payer: PPO | Admitting: Internal Medicine

## 2016-08-20 ENCOUNTER — Encounter: Payer: Self-pay | Admitting: Internal Medicine

## 2016-08-20 VITALS — BP 130/60 | HR 72 | Temp 97.9°F | Ht 66.0 in | Wt 216.0 lb

## 2016-08-20 DIAGNOSIS — Z85118 Personal history of other malignant neoplasm of bronchus and lung: Secondary | ICD-10-CM | POA: Diagnosis not present

## 2016-08-20 DIAGNOSIS — I480 Paroxysmal atrial fibrillation: Secondary | ICD-10-CM

## 2016-08-20 DIAGNOSIS — Z902 Acquired absence of lung [part of]: Secondary | ICD-10-CM | POA: Diagnosis not present

## 2016-08-20 DIAGNOSIS — Z720 Tobacco use: Secondary | ICD-10-CM

## 2016-08-20 DIAGNOSIS — R600 Localized edema: Secondary | ICD-10-CM | POA: Diagnosis not present

## 2016-08-20 DIAGNOSIS — H9202 Otalgia, left ear: Secondary | ICD-10-CM | POA: Diagnosis not present

## 2016-08-20 NOTE — Progress Notes (Signed)
Location:  [facility] clinic Provider:  Patrisia Faeth L. Mariea Clonts, D.O., C.M.D.  Code Status: full code Goals of Care:  Advanced Directives 08/20/2016  Does Patient Have a Medical Advance Directive? No  Would patient like information on creating a medical advance directive? No - Patient declined  Pre-existing out of facility DNR order (yellow form or pink MOST form) -   Chief Complaint  Patient presents with  . Medical Management of Chronic Issues    44[facility] follow-up    HPI: Patient is a 76y.o. male seen today for medical management of chronic diseases (6 mo f/u).  Pt follows with JJanett Billowas PCP.  Most recently, he had seen Dr. CEulas Postacutely for LE swelling for several weeks, left greater than right.  Edema much better with lasix.  He has it prn.    Has EKG, echo with bubble study and CT chest with Dr. GServando Snareall set up.   Smoking:  Ongoing.  He has cut way back to 1/2 ppd some days.  Smokes after eats.  Has tried many things to quit--cold t[place]with success at one point, hypnosis did not work, did patch, has not tried chantix or wellbutrin.  Has been smoking for 60 years.  Hard habit to break.    Left otalgia:  Saw Dr. BRedmond Basemanat ENT.  Took 5 wks for him to get in.  He used a lavage system (sounds like nettipot) and got his ears better.  Ear was clear when Dr. BRedmond Basemanexamined him.  Afib:  On lopressor for rate control.  Kicked in during a hospitalization when he had his VATS for lung cancer.  He had angioedema from aspirin.    Not on anticoagulation now.    Lung cancer:  Had sarcomatoid ca of left lower lobe.  Follows up with Dr. GServando Snare  He got all of it.  That was early last year.  Getting q 6 mos CTs.     Past Medical History:  Diagnosis Date  . [place]fib ([place]   . Acute appendicitis with peritoneal abscess 01/28/2013  . Arthritis   . Bacteremia September 2015  . Elevated PSA   . Essential hypertension 04/16/2014  . Heart murmur    childhood  . Ileus, postoperative ([place] 01/28/2013  . Lung  cancer ([place]   . Obesity, morbid ([place] 01/28/2013  . Shortness of breath   . Tobacco use disorder 01/28/2013    Past Surgical History:  Procedure Laterality Date  . APPENDECTOMY    . [place] 2003   Dr.Apleton   . CARPAL TUNNEL RELEASE    . COLONOSCOPY     x 3  . EYE SURGERY Left    muscle of the eye   . KNEE SURGERY Bilateral   . LAPAROSCOPIC APPENDECTOMY N/A 01/25/2013   Procedure: APPENDECTOMY LAPAROSCOPIC;  Surgeon: ARalene Ok MD;  [place]  Service: General;  Laterality: N/A;  . LYMPH NODE DISSECTION Left 04/26/2015   Procedure: LYMPH NODE DISSECTION;  Surgeon: EGrace Isaac MD;  [place]  Service: Thoracic;  Laterality: Left;  . SHOULDER SURGERY Right 2014   Dr.Whitfield rotator cuff surgery  . TEE WITHOUT CARDIOVERSION N/A 04/07/2014   Procedure: TRANSESOPHAGEAL ECHOCARDIOGRAM (TEE);  Surgeon: MCandee Furbish MD;  [facility];  Service: Cardiovascular;  Laterality: N/A;  . VIDEO ASSISTED THORACOSCOPY (VATS)/WEDGE RESECTION Left 04/26/2015   Procedure: LEFT VIDEO ASSISTED THORACOSCOPY (VATS) WITH LEFT LOWER LOBECTOMY;  Surgeon: EGrace Isaac MD;  [place]  Service:  Thoracic;  Laterality: Left;  Marland Kitchen VIDEO BRONCHOSCOPY N/A 04/26/2015   Procedure: VIDEO BRONCHOSCOPY;  Surgeon: Grace Isaac, MD;  [facility];  Service: Thoracic;  Laterality: N/A;    Allergies  Allergen Reactions  . Asa [Aspirin] Anaphylaxis and Swelling    Angioedema/Eyes and lip swelling  . Nsaids Hives  . Tolmetin Hives  . Naproxen Hives    Allergies as of 08/20/2016      Reactions   Asa [aspirin] Anaphylaxis, Swelling   Angioedema/Eyes and lip swelling   Nsaids Hives   Tolmetin Hives   Naproxen Hives      Medication List       Accurate as of 08/20/16 11:59 PM. Always use your most recent med list.          furosemide 20 MG tablet Commonly known as:  LASIX Take 1 tablet (20 mg total) by mouth daily as needed for edema.     hydrALAZINE 25 MG tablet Commonly known as:  APRESOLINE Take one tablet by mouth three times daily for blood pressure   losartan 50 MG tablet Commonly known as:  COZAAR TAKE 1 TABLET BY MOUTH EVERY DAY FOR BLOOD PRESSURE   metoprolol tartrate 25 MG tablet Commonly known as:  LOPRESSOR TAKE 1/2 TABLET(12.5 MG) BY MOUTH TWICE DAILY       Review of Systems:  Review of Systems  Constitutional: Negative for chills, fever and malaise/fatigue.  HENT: Positive for hearing loss.   Eyes: Negative for blurred vision.  Respiratory: Positive for cough and sputum production. Negative for shortness of breath.   Cardiovascular: Negative for chest pain and leg swelling.       Swelling better  Gastrointestinal: Negative for abdominal pain.  Musculoskeletal: Negative for falls.  Neurological: Negative for dizziness and weakness.  Endo/Heme/Allergies: Bruises/bleeds easily.  Psychiatric/Behavioral: Negative for memory loss.    Health Maintenance  Topic Date Due  . TETANUS/TDAP  06/11/1962  . ZOSTAVAX  06/12/2003  . COLON CANCER SCREENING ANNUAL FOBT  10/18/2016  . COLONOSCOPY  10/18/2020  . INFLUENZA VACCINE  Completed  . PNA vac Low Risk Adult  Completed    Physical Exam: Vitals:   08/20/16 1515  BP: 130/60  Pulse: 72  Temp: 97.9 F (36.6 C)  TempSrc: Oral  SpO2: 94%  Weight: 216 lb (98 kg)  Height: '5\' 6"'$  (1.676 m)   Body mass index is 34.86 kg/m. Physical Exam  Constitutional: He is oriented to person, place, and time. He appears well-developed and well-nourished. No distress.  HENT:  Head: Normocephalic and atraumatic.  Cardiovascular: Intact distal pulses.   irreg irreg  Pulmonary/Chest: Effort normal.  Coarse rhonchi and wheezes  Abdominal: Soft. Bowel sounds are normal.  Musculoskeletal: Normal range of motion.  Neurological: He is alert and oriented to person, place, and time.  Skin: There is erythema.  Ruddy redish purple complexion on face  Psychiatric: He  has a normal mood and affect.    Labs reviewed: Basic Metabolic Panel:  Recent Labs  02/08/16 0815 08/03/16 1453  NA 139 140  K 4.4 5.0  CL 105 107  CO2 21 28  GLUCOSE 101* 81  BUN 15 14  CREATININE 0.80 0.85  CALCIUM 9.2 9.7  TSH 1.01  --    Liver Function Tests:  Recent Labs  02/08/16 0815 08/03/16 1453  AST 19 23  ALT 16 19  ALKPHOS 86 82  BILITOT 0.6 0.4  PROT 6.7 7.3  ALBUMIN 4.3 4.3   No results for input(s):  LIPASE, AMYLASE in the last 8760 hours. No results for input(s): AMMONIA in the last 8760 hours. CBC:  Recent Labs  02/08/16 0815 08/03/16 1453  WBC 7.9 8.0  NEUTROABS 5,688 5,360  HGB 17.9* 17.3*  HCT 51.3* 50.7*  MCV 92.8 95.1  PLT 223 222   Lipid Panel:  Recent Labs  02/08/16 0815  CHOL 155  HDL 36*  LDLCALC 85  TRIG 170*  CHOLHDL 4.3   Lab Results  Component Value Date   HGBA1C 5.3 07/27/2014     Assessment/Plan 1. Bilateral leg edema -improved with addition of lasix, has upcoming echo to evaluate cardiac function further  2. S/P lobectomy of lung -for lung cancer -did not require any additional therapy -follows with CTS  3. History of lung cancer -keep f/u with CTS   4. Paroxysmal atrial fibrillation (HCC) -rate controlled with lopressor, not currently on anticoagulation anymore -cannot take asa due to angioedema--unfortunate with his polycythemia (elevated h/h)  5. Tobacco abuse -ongoing, counseled extensively today for more than three mins on smoking cessation; however, not interested in trying wellbutrin or chantix for this at this time, but may consider in the future  6. Otalgia, left -resolved with use of nettipot to clean sinuses  Labs/tests ordered:  No orders of the defined types were placed in this encounter.  Next appt:  Keep regular appt with PCP  Teesha Ohm L. Nataline Basara, D.O. [facility] [address], [place] [address] Cell Phone (Mon-Fri 8am-5pm):   [phone] On Call:  [phone] & follow prompts after 5pm & weekends Office Phone:  [phone] Office Fax:  [phone]

## 2016-08-23 ENCOUNTER — Other Ambulatory Visit: Payer: Self-pay | Admitting: Nurse Practitioner

## 2016-08-30 ENCOUNTER — Telehealth: Payer: Self-pay | Admitting: *Deleted

## 2016-08-30 ENCOUNTER — Other Ambulatory Visit: Payer: Self-pay

## 2016-08-30 ENCOUNTER — Ambulatory Visit (HOSPITAL_COMMUNITY): Payer: PPO | Attending: Cardiovascular Disease

## 2016-08-30 ENCOUNTER — Other Ambulatory Visit: Payer: Self-pay | Admitting: Internal Medicine

## 2016-08-30 DIAGNOSIS — R600 Localized edema: Secondary | ICD-10-CM

## 2016-08-30 DIAGNOSIS — I10 Essential (primary) hypertension: Secondary | ICD-10-CM | POA: Insufficient documentation

## 2016-08-30 DIAGNOSIS — I480 Paroxysmal atrial fibrillation: Secondary | ICD-10-CM

## 2016-08-30 MED ORDER — PERFLUTREN LIPID MICROSPHERE
1.0000 mL | INTRAVENOUS | Status: AC | PRN
  Administered 2016-08-30: 3 mL via INTRAVENOUS

## 2016-08-30 NOTE — Telephone Encounter (Signed)
[facility] calling trying to get clarification on the echo order, did you mean to order a "Bubble Echo"? Per radiologist the notes don't support ordering Bubble Echo, order changed to regular Echo.

## 2016-09-13 ENCOUNTER — Encounter: Payer: Self-pay | Admitting: Cardiothoracic Surgery

## 2016-09-13 ENCOUNTER — Ambulatory Visit (INDEPENDENT_AMBULATORY_CARE_PROVIDER_SITE_OTHER): Payer: PPO | Admitting: Cardiothoracic Surgery

## 2016-09-13 ENCOUNTER — Ambulatory Visit (INDEPENDENT_AMBULATORY_CARE_PROVIDER_SITE_OTHER): Payer: PPO | Admitting: Cardiology

## 2016-09-13 ENCOUNTER — Encounter: Payer: Self-pay | Admitting: Cardiology

## 2016-09-13 ENCOUNTER — Ambulatory Visit
Admission: RE | Admit: 2016-09-13 | Discharge: 2016-09-13 | Disposition: A | Discharge status: Home / Self Care | Payer: PPO | Source: Ambulatory Visit | Attending: Cardiothoracic Surgery | Admitting: Cardiothoracic Surgery

## 2016-09-13 VITALS — BP 116/57 | HR 70 | Resp 16 | Ht 67.0 in | Wt 220.0 lb

## 2016-09-13 VITALS — BP 132/74 | HR 78 | Ht 67.0 in | Wt 220.6 lb

## 2016-09-13 DIAGNOSIS — R911 Solitary pulmonary nodule: Secondary | ICD-10-CM

## 2016-09-13 DIAGNOSIS — Z902 Acquired absence of lung [part of]: Secondary | ICD-10-CM | POA: Diagnosis not present

## 2016-09-13 DIAGNOSIS — C3492 Malignant neoplasm of unspecified part of left bronchus or lung: Secondary | ICD-10-CM

## 2016-09-13 DIAGNOSIS — I480 Paroxysmal atrial fibrillation: Secondary | ICD-10-CM

## 2016-09-13 DIAGNOSIS — R931 Abnormal findings on diagnostic imaging of heart and coronary circulation: Secondary | ICD-10-CM | POA: Diagnosis not present

## 2016-09-13 DIAGNOSIS — C3412 Malignant neoplasm of upper lobe, left bronchus or lung: Secondary | ICD-10-CM | POA: Diagnosis not present

## 2016-09-13 DIAGNOSIS — Z720 Tobacco use: Secondary | ICD-10-CM | POA: Diagnosis not present

## 2016-09-13 NOTE — Progress Notes (Signed)
[facility]. [address] [place], [place]  [address] Phone: [phone] Fax:  [phone]  Date:  09/13/2016   ID:  ISMAEL [name], DOB 1940-08-19, MRN [id number]  PCP:  [name], NP   History of Present Illness: [name] is a 76 y.o. male with diastolic dysfunction, prior hospitalization in September 2015, atrial fibrillation with rapid ventricular response in the setting of infection who converted to normal sinus rhythm on amiodarone, diltiazem, previously chronic anticoagulation with Eliquis-no longer taking, transesophageal echocardiogram showing no vegetations, reassuring.   He had hospitalization for VATS, lung cancer and had postoperative atrial fibrillation. Took amiodarone, immediately converted. Maintaining Lopressor 12.5 g twice a day. Aspirin was once again tried but he developed allergy type symptoms. He is avoiding aspirin.Marland Kitchen  MRI of back septic joints. This is what prompted TEE which was reassuring no endocarditis.  It is certainly plausible that his atrial fibrillation had an underlying reversible cause which was his severe infectious state in the hospital/postoperative state.  He had some increased lower extremity edema. Hollace Kinnier, D.O. had started some Lasix and ordered an echocardiogram which demonstrated diastolic dysfunction. I personally reviewed the echocardiogram and would have graded the diastolic dysfunction more on the grade 1 side with elevated left atrial filling pressures. His EF is normal. He stays away from salt. Enjoys fishing. Drinks lots of coffee.   Wt Readings from Last 3 Encounters:  09/13/16 220 lb 9.6 oz (100.1 kg)  08/20/16 216 lb (98 kg)  08/03/16 217 lb (98.4 kg)     Past Medical History:  Diagnosis Date  . A-fib ([place])   . Acute appendicitis with peritoneal abscess 01/28/2013  . Arthritis   . Bacteremia September 2015  . Elevated PSA   . Essential hypertension 04/16/2014  . Heart murmur    childhood  .  Ileus, postoperative ([place]) 01/28/2013  . Lung cancer ([place])   . Obesity, morbid ([place]) 01/28/2013  . Shortness of breath   . Tobacco use disorder 01/28/2013    Past Surgical History:  Procedure Laterality Date  . APPENDECTOMY    . [place], 2003   Dr.Apleton   . CARPAL TUNNEL RELEASE    . COLONOSCOPY     x 3  . EYE SURGERY Left    muscle of the eye   . KNEE SURGERY Bilateral   . LAPAROSCOPIC APPENDECTOMY N/A 01/25/2013   Procedure: APPENDECTOMY LAPAROSCOPIC;  Surgeon: Ralene Ok, MD;  Location: [facility];  Service: General;  Laterality: N/A;  . LYMPH NODE DISSECTION Left 04/26/2015   Procedure: LYMPH NODE DISSECTION;  Surgeon: Grace Isaac, MD;  Location: [facility];  Service: Thoracic;  Laterality: Left;  . SHOULDER SURGERY Right 2014   Dr.Whitfield rotator cuff surgery  . TEE WITHOUT CARDIOVERSION N/A 04/07/2014   Procedure: TRANSESOPHAGEAL ECHOCARDIOGRAM (TEE);  Surgeon: Candee Furbish, MD;  Location: [facility] ENDOSCOPY;  Service: Cardiovascular;  Laterality: N/A;  . VIDEO ASSISTED THORACOSCOPY (VATS)/WEDGE RESECTION Left 04/26/2015   Procedure: LEFT VIDEO ASSISTED THORACOSCOPY (VATS) WITH LEFT LOWER LOBECTOMY;  Surgeon: Grace Isaac, MD;  Location: [facility];  Service: Thoracic;  Laterality: Left;  Marland Kitchen VIDEO BRONCHOSCOPY N/A 04/26/2015   Procedure: VIDEO BRONCHOSCOPY;  Surgeon: Grace Isaac, MD;  Location: [facility] OR;  Service: Thoracic;  Laterality: N/A;    Current Outpatient Prescriptions  Medication Sig Dispense Refill  . furosemide (LASIX) 20 MG tablet Take 1 tablet (20 mg total) by mouth daily as needed for edema.  30 tablet 3  . hydrALAZINE (APRESOLINE) 25 MG tablet Take one tablet by mouth three times daily for blood pressure 90 tablet 11  . losartan (COZAAR) 50 MG tablet TAKE 1 TABLET BY MOUTH EVERY DAY FOR BLOOD PRESSURE 90 tablet 1  . metoprolol tartrate (LOPRESSOR) 25 MG tablet TAKE 1/2 TABLET(12.5 MG) BY MOUTH TWICE DAILY 90 tablet 0   No current facility-administered  medications for this visit.     Allergies:    Allergies  Allergen Reactions  . Asa [Aspirin] Anaphylaxis and Swelling    Angioedema/Eyes and lip swelling  . Nsaids Hives  . Tolmetin Hives  . Naproxen Hives    Social History:  The patient  reports that he has been smoking Cigars.  He started smoking about 58 years ago. He has a 25.00 pack-year smoking history. He has quit using smokeless tobacco. His smokeless tobacco use included Chew. He reports that he does not drink alcohol or use drugs.   No early family history of coronary artery disease  ROS:  Please see the history of present illness.   Denies any syncope, bleeding, orthopnea, PND   All other systems reviewed and negative.   PHYSICAL EXAM: VS:  BP 132/74   Pulse 78   Ht '5\' 7"'$  (1.702 m)   Wt 220 lb 9.6 oz (100.1 kg)   BMI 34.55 kg/m  Well nourished, well developed, in no acute distress HEENT: normal, [place]/AT, EOMI Neck: no JVD, normal carotid upstroke, no bruit Cardiac:  normal S1, S2; RRR; no murmur Lungs:  clear to auscultation bilaterally, no wheezing, rhonchi or rales Abd: soft, nontender, no hepatomegaly, no bruitsoverweight Ext: 1+ edema, 2+ distal pulses Skin: warm and dry GU: deferred Neuro: no focal abnormalities noted, AAO x 3  EKG: 11/29/14-sinus rhythm, 75, vertical axis ,borderline interventricular conduction delay. 08/12/14-normal rhythm, left axis deviation, vertical axis, poor R-wave progression. QTC 473 ms.    Labs: 07/27/14-liver functions normal, creatinine 0.9, hemoglobin 17.6. TSH from 04/01/14 was 0.9.  ECHO: 55% on TEE-no vegetations.  08/30/16 - Left ventricle: The cavity size was normal. Wall thickness was   normal. Systolic function was normal. The estimated ejection   fraction was in the range of 60% to 65%. Wall motion was normal;   there were no regional wall motion abnormalities. Features are   consistent with a pseudonormal left ventricular filling pattern,   with concomitant abnormal  relaxation and increased filling   pressure (grade 2 diastolic dysfunction). Looked personally and appears to be more in the grade 1 diastolic dysfunction with elevated left atrial filling pressures. - Aortic valve: Mildly calcified annulus. - Mitral valve: Mildly calcified annulus.  ASSESSMENT AND PLAN:  1. Paroxysmal atrial fibrillation- no further occurrence. There was reversible cause previously.  Converted on amiodarone during hospitalization in September 2015 in the setting of bacteremia as well as during hospitalization for VATS October 2016, had left lower lobectomy.  In both situations, he had a reversible cause for his atrial fibrillation i.e. severe infection/surgery. Since he is demonstrated normal sinus rhythm since conversion, we have had discussion previously about discontinuation of both amiodarone as well as Eliquis. We will go ahead and discontinue. I will continue with the Lopressor 12.5 mg twice a day however. Of course, there is always a risk that he would return in to atrial fibrillation unmonitored and potential stroke risk is present, he understands. Lab work reassuring. He will let me know if he feels any symptoms of atrial fibrillation, palpitations.  2. Chronic anticoagulation-  discontinued Eliquis in the past. Used to take ASA for years but now is allergic.  Discontinued aspirin, did cause angioedema at home. 3. Osteomyelitis, resolved-Dr. Algis Downs note reviewed. Antibiotics discontinued. Resolved. Prior groin abscess however. 4. Tobacco use - urge to smoke after eating.  Cessation  Discussed at length.  5. Obesity-continue to encourage weight loss. Risk factor for developing atrial fibrillation once again.  Nutrisystem 6. Essential hypertension-normal. Well controlled 7. Lower extremity edema-diastolic dysfunction. Reported out as grade 2 diastolic dysfunction however looks more like grade 1 diastolic dysfunction with elevated left atrial filling pressures. Regardless,  excellent management by Dr. Hollace Kinnier, Lasix now when necessary. He knows to avoid excessive fluid use, 1.5 L a day. No salt. Exercise. Weight loss would be helpful. Compression stockings can be helpful as well. Smoking cessation. 8. PRN follow-up.  Signed, Candee Furbish, MD [facility]  09/13/2016 8:58 AM

## 2016-09-13 NOTE — Patient Instructions (Signed)
Medication Instructions:  The current medical regimen is effective;  continue present plan and medications.  Follow-Up: Follow up as needed with Dr Skains.  Thank you for choosing [facility]!!     

## 2016-09-13 NOTE — Progress Notes (Signed)
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 PROCEDURE: Left lower lobectomy and lymph node dissection, video bronchoscopy, left video-assisted thoracoscopy, and placement of On-Q. 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  He has no symptoms of shortness of breath, or hemoptysis.  Unfortunately he continues to smoke some and is also using a vape  Cigarette  Past Medical History:  Diagnosis Date  . A-fib ([place])   . Acute appendicitis with peritoneal abscess 01/28/2013  . Arthritis   . Bacteremia September 2015  . Elevated PSA   . Essential hypertension 04/16/2014  . Heart murmur    childhood  . Ileus, postoperative ([place]) 01/28/2013  . Lung cancer ([place])   . Obesity, morbid ([place]) 01/28/2013  . Shortness of breath   . Tobacco use disorder 01/28/2013     History  Smoking Status  . Current Every Day Smoker  . Packs/day: 0.50  . Years: 50.00  . Types: Cigars  . Start date: 07/16/1958  Smokeless Tobacco  . Former Systems developer  . Types: Chew    History  Alcohol Use No     Allergies  Allergen Reactions  . Asa [Aspirin] Anaphylaxis and Swelling    Angioedema/Eyes and lip swelling  . Nsaids Hives  . Tolmetin Hives  . Naproxen  Hives    Current Outpatient Prescriptions  Medication Sig Dispense Refill  . furosemide (LASIX) 20 MG tablet Take 1 tablet (20 mg total) by mouth daily as needed for edema. 30 tablet 3  . hydrALAZINE (APRESOLINE) 25 MG tablet Take one tablet by mouth three times daily for blood pressure 90 tablet 11  . losartan (COZAAR) 50 MG tablet TAKE 1 TABLET BY MOUTH EVERY DAY FOR BLOOD PRESSURE 90 tablet 1  . metoprolol tartrate (LOPRESSOR) 25 MG tablet TAKE 1/2 TABLET(12.5 MG) BY MOUTH TWICE DAILY 90 tablet 0   No current facility-administered medications for this visit.        Physical Exam: BP (!) 116/57 (BP Location: Left Arm, Patient Position: Sitting, Cuff Size: Large)   Pulse 70   Resp 16   Ht '5\' 7"'$  (1.702 m)   Wt 220 lb (99.8 kg)   SpO2 95% Comment: RA  BMI 34.46 kg/m   General appearance: alert, cooperative and no distress Neurologic: intact Heart: regular rate and rhythm, S1, S2 normal, no murmur, click, rub or gallop Lungs: clear to auscultation bilaterally Abdomen: soft, non-tender; bowel sounds normal; no masses,  no organomegaly Extremities: extremities normal, atraumatic, no cyanosis or edema and Homans sign is negative, no sign of DVT Wound: Left port sites chest tube sites and incision are all healing well No cervical or  supraclavicular adenopathy noted  Diagnostic Studies & Laboratory data:     Recent Radiology Findings:   Ct Chest Wo Contrast  Result Date: 09/13/2016 CLINICAL DATA:  Status post left lower lobectomy 04/26/2015 for sarcomatoid lung carcinoma, presenting for restaging. Current smoker. EXAM: CT CHEST WITHOUT CONTRAST TECHNIQUE: Multidetector CT imaging of the chest was performed following the standard protocol without IV contrast. COMPARISON:  02/23/2016 chest CT. FINDINGS: Cardiovascular: Normal heart size. No significant pericardial fluid/thickening. Left anterior descending, left circumflex and right coronary atherosclerosis. Atherosclerotic  nonaneurysmal thoracic aorta. Stable top-normal caliber main pulmonary artery (3.1 cm diameter). Mediastinum/Nodes: No discrete thyroid nodules. Unremarkable esophagus. No pathologically enlarged axillary, mediastinal or gross hilar lymph nodes, noting limited sensitivity for the detection of hilar adenopathy on this noncontrast study. Lungs/Pleura: No pneumothorax. No right pleural effusion. Status post left lower lobectomy. Tiny loculated dependent basilar left pleural effusion is decreased. Tiny peripheral right upper lobe 3 mm pulmonary nodule (series 4/ image 38) is stable back to 09/26/2015 and considered benign. Stable small parenchymal bands at the left lung base compatible with mild postinfectious scarring. Stable 2 mm calcified granuloma in the peripheral left upper lobe. No acute consolidative airspace disease, lung masses or new significant pulmonary nodules. Upper abdomen: Simple 1.9 cm left liver lobe cyst. Musculoskeletal: No aggressive appearing focal osseous lesions. Moderate thoracic spondylosis. No appreciable change and circumscribed superficial subcutaneous nodules in the bilateral back of variable density, largest 4.0 cm in the right back, most compatible with sebaceous cysts. IMPRESSION: 1. No evidence of local tumor recurrence in the left lung status post left lower lobectomy. 2. No evidence of metastatic disease in the chest. 3. Continued reduction in size of tiny loculated basilar left pleural effusion. 4. Aortic atherosclerosis.  Three-vessel coronary atherosclerosis. Electronically Signed   By: Ilona Sorrel M.D.   On: 09/13/2016 15:30   Ct Chest Wo Contrast  Result Date: 02/23/2016 CLINICAL DATA:  History of lung cancer status post left lower lobe wedge resection 04/26/2015. EXAM: CT CHEST WITHOUT CONTRAST TECHNIQUE: Multidetector CT imaging of the chest was performed following the standard protocol without IV contrast. COMPARISON:  Chest CT 09/26/2015 and PET CT 03/07/2015. FINDINGS:  Cardiovascular: Atherosclerosis of aorta, great vessels and coronary arteries again noted. No acute vascular findings are demonstrated on noncontrast imaging. The heart size is normal. There is no pericardial effusion. Mediastinum/Nodes: Small mediastinal lymph nodes are unchanged. Hilar assessment is limited by the lack of intravenous contrast, although the hilar contours appear unchanged. The thyroid gland, trachea and esophagus demonstrate no significant findings. Lungs/Pleura: Small left pleural effusion has decreased in volume. There is no significant pleural fluid on the right. Stable postsurgical changes status post left lower lobe resection. There is a stable calcified lingular granuloma. There is a stable small perifissural nodule along the minor fissure on image 63. There is a stable tiny right upper lobe nodule on image 34. No suspicious pulmonary nodules. Upper abdomen: The visualized upper abdomen appears stable without suspicious findings. Low-density hepatic lesion is unchanged, likely a cyst. Musculoskeletal/Chest wall: There is no chest wall mass or suspicious osseous finding. Thoracotomy defects noted on the left. Several probable epidermal inclusion (sebaceous) cysts again noted posteriorly in the right chest wall. IMPRESSION: 1. Interval improvement in small loculated left pleural effusion following left lower lobe resection. 2. No evidence of local recurrence or metastatic disease. 3. Atherosclerosis, as before. Electronically Signed   By: Richardean Sale M.D.   On: 02/23/2016 11:52   I have independently reviewed the  above radiology studies  and reviewed the findings with the patient.     Ct Chest Wo Contrast  09/26/2015  CLINICAL DATA:  Left lower lobe lung cancer and resection. Weight loss. EXAM: CT CHEST WITHOUT CONTRAST TECHNIQUE: Multidetector CT imaging of the chest was performed following the standard protocol without IV contrast. COMPARISON:  PET 03/07/2015 and CT chest  08/05/2014. FINDINGS: Mediastinum/Nodes: Mediastinal lymph nodes are not enlarged by CT size criteria. Hilar regions are difficult to definitively evaluate without IV contrast. No axillary adenopathy. Atherosclerotic calcification of the arterial vasculature, including three-vessel involvement of the coronary arteries. Heart size normal. No pericardial effusion. Lungs/Pleura: Postoperative changes of left lower lobectomy with a small left pleural effusion. Minimal linear volume loss in the left upper lobe. Calcified granuloma in the lingula. 4 mm right upper lobe nodule (4/19), unchanged. Airway is otherwise unremarkable. Upper abdomen: Low-attenuation lesion in the dome of the liver measures 1.8 cm, as before. Visualized portions of the liver, gallbladder, adrenal glands, kidneys, spleen, pancreas, stomach and bowel are otherwise grossly unremarkable. No upper abdominal adenopathy. Musculoskeletal: No worrisome lytic or sclerotic lesions. Degenerative changes are seen in the spine. Thoracotomy changes on the left. Probable subcutaneous sebaceous cysts overlying the right scapula and posterior right ribs. IMPRESSION: 1. Postoperative changes of left lower lobectomy without evidence of recurrent or metastatic disease. 2. Small left pleural effusion. 3. Three-vessel coronary artery calcification. Electronically Signed   By: Lorin Picket M.D.   On: 09/26/2015 14:18      Recent Lab Findings: Lab Results  Component Value Date   WBC 8.0 08/03/2016   HGB 17.3 (H) 08/03/2016   HCT 50.7 (H) 08/03/2016   PLT 222 08/03/2016   GLUCOSE 81 08/03/2016   CHOL 155 02/08/2016   TRIG 170 (H) 02/08/2016   HDL 36 (L) 02/08/2016   LDLCALC 85 02/08/2016   ALT 19 08/03/2016   AST 23 08/03/2016   NA 140 08/03/2016   K 5.0 08/03/2016   CL 107 08/03/2016   CREATININE 0.85 08/03/2016   BUN 14 08/03/2016   CO2 28 08/03/2016   TSH 1.01 02/08/2016   INR 1.00 04/20/2015   HGBA1C 5.3 07/27/2014      Assessment /  Plan:    Patient doing well  postop now about A year and a half  since left lower lobectomy for a 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 Patient does have noted calcifications in his coronary arteries - he was seen by cardiology today   I'll plan to see him back in 6 months with a follow-up CT scan of the chest   Grace Isaac MD      [facility] [place] [address] Office [phone]   Beeper [phone]  09/13/2016 4:25 PM

## 2016-10-08 ENCOUNTER — Ambulatory Visit: Payer: PPO | Admitting: Cardiology

## 2016-10-10 ENCOUNTER — Other Ambulatory Visit: Payer: Self-pay | Admitting: Nurse Practitioner

## 2016-11-15 ENCOUNTER — Other Ambulatory Visit: Payer: Self-pay | Admitting: Nurse Practitioner

## 2016-11-19 ENCOUNTER — Ambulatory Visit (INDEPENDENT_AMBULATORY_CARE_PROVIDER_SITE_OTHER): Payer: PPO | Admitting: Nurse Practitioner

## 2016-11-19 ENCOUNTER — Encounter: Payer: Self-pay | Admitting: Nurse Practitioner

## 2016-11-19 VITALS — BP 136/68 | HR 63 | Temp 98.7°F | Resp 18 | Ht 67.0 in | Wt 220.0 lb

## 2016-11-19 DIAGNOSIS — Z720 Tobacco use: Secondary | ICD-10-CM | POA: Diagnosis not present

## 2016-11-19 DIAGNOSIS — I10 Essential (primary) hypertension: Secondary | ICD-10-CM | POA: Diagnosis not present

## 2016-11-19 DIAGNOSIS — R600 Localized edema: Secondary | ICD-10-CM | POA: Diagnosis not present

## 2016-11-19 DIAGNOSIS — H9202 Otalgia, left ear: Secondary | ICD-10-CM | POA: Diagnosis not present

## 2016-11-19 DIAGNOSIS — Z85118 Personal history of other malignant neoplasm of bronchus and lung: Secondary | ICD-10-CM

## 2016-11-19 NOTE — Progress Notes (Signed)
Careteam: Patient Care Team: [name], NP as PCP - General (Nurse Practitioner) Druscilla Brownie, MD as Consulting Physician (Dermatology)  Advanced Directive information Does Patient Have a Medical Advance Directive?: No  Allergies  Allergen Reactions  . Asa [Aspirin] Anaphylaxis and Swelling    Angioedema/Eyes and lip swelling  . Nsaids Hives  . Tolmetin Hives  . Naproxen Hives    Chief Complaint  Patient presents with  . Medical Management of Chronic Issues    Pt is being seen for a 3 month follow up.      HPI: Patient is a 76 y.o. male seen in the office today for routine follow up. Pt with hx of a fib (currently SR), arthritis, lung cancer s/p resection. Had follow up with cardiovascular surgeon and recent CT scan and everything looks good. If next 6 month follow up looks good then he will be able to go to yearly. Followed with cardiologist due to lower extremity edema and diastolic dysfunction noted on echo. Cardiologist reports grade 1 diastolic dysfunction and to use lasix as needed for lasix. Has not needed recently. Avoiding salt and excessive fluid.   conts to smoke, smokes 1/2 pack a day.  Still having trouble with his ear. Comes and goes. Went to see Dr Redmond Baseman but was not having the problem when he went. Over the weekend was having the feeling of pain or gurgling feeling or wax was oozing out but not wet very uncomfortable when he notices it. Has used sweet oil in the past and it does not help.  Now he is not having pain or any symptoms  Review of Systems:  Review of Systems  Constitutional: Negative for activity change, appetite change, fatigue and unexpected weight change.  HENT: Negative for congestion and hearing loss.        Ear discomfort  Eyes: Negative.   Respiratory: Negative for cough and shortness of breath.   Cardiovascular: Negative for chest pain, palpitations and leg swelling.  Gastrointestinal: Negative for abdominal pain,  constipation and diarrhea.  Genitourinary: Negative for difficulty urinating and dysuria.  Musculoskeletal: Negative for arthralgias and myalgias.  Skin: Negative for color change and wound.  Neurological: Negative for dizziness and weakness.  Psychiatric/Behavioral: Negative for agitation, behavioral problems and confusion.    Past Medical History:  Diagnosis Date  . A-fib ([place])   . Acute appendicitis with peritoneal abscess 01/28/2013  . Arthritis   . Bacteremia September 2015  . Elevated PSA   . Essential hypertension 04/16/2014  . Heart murmur    childhood  . Ileus, postoperative ([place]) 01/28/2013  . Lung cancer ([place])   . Obesity, morbid ([place]) 01/28/2013  . Shortness of breath   . Tobacco use disorder 01/28/2013   Past Surgical History:  Procedure Laterality Date  . APPENDECTOMY    . [place], 2003   Dr.Apleton   . CARPAL TUNNEL RELEASE    . COLONOSCOPY     x 3  . EYE SURGERY Left    muscle of the eye   . KNEE SURGERY Bilateral   . LAPAROSCOPIC APPENDECTOMY N/A 01/25/2013   Procedure: APPENDECTOMY LAPAROSCOPIC;  Surgeon: Ralene Ok, MD;  Location: [facility];  Service: General;  Laterality: N/A;  . LYMPH NODE DISSECTION Left 04/26/2015   Procedure: LYMPH NODE DISSECTION;  Surgeon: Grace Isaac, MD;  Location: [facility];  Service: Thoracic;  Laterality: Left;  . SHOULDER SURGERY Right 2014   Dr.Whitfield rotator cuff surgery  . TEE WITHOUT  CARDIOVERSION N/A 04/07/2014   Procedure: TRANSESOPHAGEAL ECHOCARDIOGRAM (TEE);  Surgeon: Candee Furbish, MD;  Location: [facility] ENDOSCOPY;  Service: Cardiovascular;  Laterality: N/A;  . VIDEO ASSISTED THORACOSCOPY (VATS)/WEDGE RESECTION Left 04/26/2015   Procedure: LEFT VIDEO ASSISTED THORACOSCOPY (VATS) WITH LEFT LOWER LOBECTOMY;  Surgeon: Grace Isaac, MD;  Location: [facility];  Service: Thoracic;  Laterality: Left;  Marland Kitchen VIDEO BRONCHOSCOPY N/A 04/26/2015   Procedure: VIDEO BRONCHOSCOPY;  Surgeon: Grace Isaac, MD;  Location: [place];  Service: Thoracic;  Laterality: N/A;   Social History:   reports that he has been smoking Cigars.  He started smoking about 58 years ago. He has a 25.00 pack-year smoking history. He has quit using smokeless tobacco. His smokeless tobacco use included Chew. He reports that he does not drink alcohol or use drugs.  Family History  Problem Relation Age of Onset  . Irritable bowel syndrome Mother   . Cancer Neg Hx   . Lung disease Neg Hx   . Colon cancer Neg Hx   . Colon polyps Neg Hx     Medications: Patient's Medications  New Prescriptions   No medications on file  Previous Medications   FUROSEMIDE (LASIX) 20 MG TABLET    Take 1 tablet (20 mg total) by mouth daily as needed for edema.   HYDRALAZINE (APRESOLINE) 25 MG TABLET    Take one tablet by mouth three times daily for blood pressure   LOSARTAN (COZAAR) 50 MG TABLET    TAKE 1 TABLET BY MOUTH EVERY DAY FOR BLOOD PRESSURE   METOPROLOL TARTRATE (LOPRESSOR) 25 MG TABLET    TAKE 1/2 TABLET(12.5 MG) BY MOUTH TWICE DAILY  Modified Medications   No medications on file  Discontinued Medications   No medications on file     Physical Exam:  Vitals:   11/19/16 0949  BP: 136/68  Pulse: 63  Resp: 18  Temp: 98.7 F (37.1 C)  TempSrc: Oral  SpO2: 98%  Weight: 220 lb (99.8 kg)  Height: '5\' 7"'  (1.702 m)   Body mass index is 34.46 kg/m.  Physical Exam  Constitutional: He is oriented to person, place, and time. He appears well-developed and well-nourished. No distress.  HENT:  Head: Normocephalic and atraumatic.  Right Ear: Tympanic membrane, external ear and ear canal normal.  Left Ear: Tympanic membrane, external ear and ear canal normal.  Nose: Nose normal.  Mouth/Throat: Oropharynx is clear and moist.  Eyes: Conjunctivae and EOM are normal. Pupils are equal, round, and reactive to light.  Cardiovascular: Normal rate, regular rhythm, normal heart sounds and intact distal pulses.   Pulmonary/Chest: Effort normal and  breath sounds normal.  Abdominal: Soft. Bowel sounds are normal.  Musculoskeletal: Normal range of motion.  Neurological: He is alert and oriented to person, place, and time.  Skin:  Ruddy redish purple complexion on face  Psychiatric: He has a normal mood and affect.   Labs reviewed: Basic Metabolic Panel:  Recent Labs  02/08/16 0815 08/03/16 1453  NA 139 140  K 4.4 5.0  CL 105 107  CO2 21 28  GLUCOSE 101* 81  BUN 15 14  CREATININE 0.80 0.85  CALCIUM 9.2 9.7  TSH 1.01  --    Liver Function Tests:  Recent Labs  02/08/16 0815 08/03/16 1453  AST 19 23  ALT 16 19  ALKPHOS 86 82  BILITOT 0.6 0.4  PROT 6.7 7.3  ALBUMIN 4.3 4.3   No results for input(s): LIPASE, AMYLASE in the last 8760 hours. No  results for input(s): AMMONIA in the last 8760 hours. CBC:  Recent Labs  02/08/16 0815 08/03/16 1453  WBC 7.9 8.0  NEUTROABS 5,688 5,360  HGB 17.9* 17.3*  HCT 51.3* 50.7*  MCV 92.8 95.1  PLT 223 222   Lipid Panel:  Recent Labs  02/08/16 0815  CHOL 155  HDL 36*  LDLCALC 85  TRIG 170*  CHOLHDL 4.3   TSH:  Recent Labs  02/08/16 0815  TSH 1.01   A1C: Lab Results  Component Value Date   HGBA1C 5.3 07/27/2014     Assessment/Plan 1. Bilateral leg edema improved  2. History of lung cancer s/p left lobectomy; following with cardiac thoracic surgery  3. Tobacco abuse Strongly encouraged cessation  4. Otalgia, left Ongoing; following with ENT in am.   5. Essential hypertension Stable on Cozaar, lopressor and hydralazine, cont medication and lifestyle modifications  - BMP with eGFR; Future - CBC with Differential/Platelets; Future - Lipid Panel; Future   Petrona Wyeth K. Harle Battiest  [facility] [phone] 8 am - 5 pm) [phone] (after hours)

## 2016-11-19 NOTE — Patient Instructions (Addendum)
Please schedule fasting blood work this week Please schedule AWV  Follow up in 5 months for routine follow up  Steps to Quit Smoking Smoking tobacco can be bad for your health. It can also affect almost every organ in your body. Smoking puts you and people around you at risk for many serious long-lasting (chronic) diseases. Quitting smoking is hard, but it is one of the best things that you can do for your health. It is never too late to quit. What are the benefits of quitting smoking? When you quit smoking, you lower your risk for getting serious diseases and conditions. They can include:  Lung cancer or lung disease.  Heart disease.  Stroke.  Heart attack.  Not being able to have children (infertility).  Weak bones (osteoporosis) and broken bones (fractures). If you have coughing, wheezing, and shortness of breath, those symptoms may get better when you quit. You may also get sick less often. If you are pregnant, quitting smoking can help to lower your chances of having a baby of low birth weight. What can I do to help me quit smoking? Talk with your doctor about what can help you quit smoking. Some things you can do (strategies) include:  Quitting smoking totally, instead of slowly cutting back how much you smoke over a period of time.  Going to in-person counseling. You are more likely to quit if you go to many counseling sessions.  Using resources and support systems, such as:  Online chats with a Social worker.  Phone quitlines.  Printed Furniture conservator/restorer.  Support groups or group counseling.  Text messaging programs.  Mobile phone apps or applications.  Taking medicines. Some of these medicines may have nicotine in them. If you are pregnant or breastfeeding, do not take any medicines to quit smoking unless your doctor says it is okay. Talk with your doctor about counseling or other things that can help you. Talk with your doctor about using more than one strategy at  the same time, such as taking medicines while you are also going to in-person counseling. This can help make quitting easier. What things can I do to make it easier to quit? Quitting smoking might feel very hard at first, but there is a lot that you can do to make it easier. Take these steps:  Talk to your family and friends. Ask them to support and encourage you.  Call phone quitlines, reach out to support groups, or work with a Social worker.  Ask people who smoke to not smoke around you.  Avoid places that make you want (trigger) to smoke, such as:  Bars.  Parties.  Smoke-break areas at work.  Spend time with people who do not smoke.  Lower the stress in your life. Stress can make you want to smoke. Try these things to help your stress:  Getting regular exercise.  Deep-breathing exercises.  Yoga.  Meditating.  Doing a body scan. To do this, close your eyes, focus on one area of your body at a time from head to toe, and notice which parts of your body are tense. Try to relax the muscles in those areas.  Download or buy apps on your mobile phone or tablet that can help you stick to your quit plan. There are many free apps, such as QuitGuide from the [employer] Office manager for Disease Control and Prevention). You can find more support from smokefree.gov and other websites. This information is not intended to replace advice given to you by your health care  provider. Make sure you discuss any questions you have with your health care provider. Document Released: 04/28/2009 Document Revised: 02/28/2016 Document Reviewed: 11/16/2014 Elsevier Interactive Patient Education  2017 [employer].

## 2016-11-23 ENCOUNTER — Ambulatory Visit (INDEPENDENT_AMBULATORY_CARE_PROVIDER_SITE_OTHER): Payer: PPO

## 2016-11-23 ENCOUNTER — Other Ambulatory Visit: Payer: PPO

## 2016-11-23 VITALS — BP 128/62 | HR 73 | Temp 97.9°F | Ht 67.0 in | Wt 220.0 lb

## 2016-11-23 DIAGNOSIS — I10 Essential (primary) hypertension: Secondary | ICD-10-CM | POA: Diagnosis not present

## 2016-11-23 DIAGNOSIS — Z0000 Encounter for general adult medical examination without abnormal findings: Secondary | ICD-10-CM | POA: Diagnosis not present

## 2016-11-23 LAB — LIPID PANEL
CHOL/HDL RATIO: 5 ratio — AB (ref <5.0)
Cholesterol: 161 mg/dL (ref <200)
HDL: 32 mg/dL — ABNORMAL LOW (ref >40)
LDL CALC: 100 mg/dL — AB (ref <100)
TRIGLYCERIDES: 143 mg/dL (ref <150)
VLDL: 29 mg/dL (ref <30)

## 2016-11-23 LAB — CBC WITH DIFFERENTIAL/PLATELET
BASOS ABS: 0 {cells}/uL (ref 0–200)
Basophils Relative: 0 %
Eosinophils Absolute: 91 cells/uL (ref 15–500)
Eosinophils Relative: 1 %
HEMATOCRIT: 47.8 % (ref 38.5–50.0)
HEMOGLOBIN: 16.4 g/dL (ref 13.2–17.1)
LYMPHS ABS: 1547 {cells}/uL (ref 850–3900)
Lymphocytes Relative: 17 %
MCH: 32.9 pg (ref 27.0–33.0)
MCHC: 34.3 g/dL (ref 32.0–36.0)
MCV: 95.8 fL (ref 80.0–100.0)
MONO ABS: 728 {cells}/uL (ref 200–950)
MPV: 10.1 fL (ref 7.5–12.5)
Monocytes Relative: 8 %
NEUTROS ABS: 6734 {cells}/uL (ref 1500–7800)
Neutrophils Relative %: 74 %
Platelets: 253 10*3/uL (ref 140–400)
RBC: 4.99 MIL/uL (ref 4.20–5.80)
RDW: 14.3 % (ref 11.0–15.0)
WBC: 9.1 10*3/uL (ref 3.8–10.8)

## 2016-11-23 LAB — BASIC METABOLIC PANEL WITH GFR
BUN: 23 mg/dL (ref 7–25)
CHLORIDE: 106 mmol/L (ref 98–110)
CO2: 27 mmol/L (ref 20–31)
Calcium: 9 mg/dL (ref 8.6–10.3)
Creat: 0.94 mg/dL (ref 0.70–1.18)
GFR, Est African American: >89 mL/min (ref >=60)
GFR, Est Non African American: 80 mL/min (ref >=60)
GLUCOSE: 98 mg/dL (ref 65–99)
POTASSIUM: 4.8 mmol/L (ref 3.5–5.3)
Sodium: 139 mmol/L (ref 135–146)

## 2016-11-23 NOTE — Patient Instructions (Addendum)
[name] , Thank you for taking time to come for your Medicare Wellness Visit. I appreciate your ongoing commitment to your health goals. Please review the following plan we discussed and let me know if I can assist you in the future.   Screening recommendations/referrals: Colonoscopy up to date Recommended yearly ophthalmology/optometry visit for glaucoma screening and checkup Recommended yearly dental visit for hygiene and checkup  Vaccinations: Influenza vaccine up to date Pneumococcal vaccine up to date Tdap vaccine up to date. Due in 2025 Shingles vaccine up to date. If you want the new one I will put in a prescription.  Advanced directives: Advance directive discussed with you today. I have provided a copy for you to complete at home and have notarized. Once this is complete please bring a copy in to our office so we can scan it into your chart.   Conditions/risks identified: Smoking cessation, decrease portion size, increase water intake.  Next appointment: [name] 04/22/2017 @ 8:30am  Preventive Care 65 Years and Older, Male Preventive care refers to lifestyle choices and visits with your health care provider that can promote health and wellness. What does preventive care include?  A yearly physical exam. This is also called an annual well check.  Dental exams once or twice a year.  Routine eye exams. Ask your health care provider how often you should have your eyes checked.  Personal lifestyle choices, including:  Daily care of your teeth and gums.  Regular physical activity.  Eating a healthy diet.  Avoiding tobacco and drug use.  Limiting alcohol use.  Practicing safe sex.  Taking low doses of aspirin every day.  Taking vitamin and mineral supplements as recommended by your health care provider. What happens during an annual well check? The services and screenings done by your health care provider during your annual well check will depend on your  age, overall health, lifestyle risk factors, and family history of disease. Counseling  Your health care provider may ask you questions about your:  Alcohol use.  Tobacco use.  Drug use.  Emotional well-being.  Home and relationship well-being.  Sexual activity.  Eating habits.  History of falls.  Memory and ability to understand (cognition).  Work and work Statistician. Screening  You may have the following tests or measurements:  Height, weight, and BMI.  Blood pressure.  Lipid and cholesterol levels. These may be checked every 5 years, or more frequently if you are over 14 years old.  Skin check.  Lung cancer screening. You may have this screening every year starting at age 74 if you have a 30-pack-year history of smoking and currently smoke or have quit within the past 74 years.  Fecal occult blood test (FOBT) of the stool. You may have this test every year starting at age 74.  Flexible sigmoidoscopy or colonoscopy. You may have a sigmoidoscopy every 5 years or a colonoscopy every 10 years starting at age 74.  Prostate cancer screening. Recommendations will vary depending on your family history and other risks.  Hepatitis C blood test.  Hepatitis B blood test.  Sexually transmitted disease (STD) testing.  Diabetes screening. This is done by checking your blood sugar (glucose) after you have not eaten for a while (fasting). You may have this done every 1-3 years.  Abdominal aortic aneurysm (AAA) screening. You may need this if you are a current or former smoker.  Osteoporosis. You may be screened starting at age 74 if you are at high risk. Talk with  your health care provider about your test results, treatment options, and if necessary, the need for more tests. Vaccines  Your health care provider may recommend certain vaccines, such as:  Influenza vaccine. This is recommended every year.  Tetanus, diphtheria, and acellular pertussis (Tdap, Td) vaccine. You  may need a Td booster every 10 years.  Zoster vaccine. You may need this after age 74.  Pneumococcal 13-valent conjugate (PCV13) vaccine. One dose is recommended after age 74.  Pneumococcal polysaccharide (PPSV23) vaccine. One dose is recommended after age 74. Talk to your health care provider about which screenings and vaccines you need and how often you need them. This information is not intended to replace advice given to you by your health care provider. Make sure you discuss any questions you have with your health care provider. Document Released: 07/29/2015 Document Revised: 03/21/2016 Document Reviewed: 05/03/2015 Elsevier Interactive Patient Education  2017 [employer] Prevention in the Home Falls can cause injuries. They can happen to people of all ages. There are many things you can do to make your home safe and to help prevent falls. What can I do on the outside of my home?  Regularly fix the edges of walkways and driveways and fix any cracks.  Remove anything that might make you trip as you walk through a door, such as a raised step or threshold.  Trim any bushes or trees on the path to your home.  Use bright outdoor lighting.  Clear any walking paths of anything that might make someone trip, such as rocks or tools.  Regularly check to see if handrails are loose or broken. Make sure that both sides of any steps have handrails.  Any raised decks and porches should have guardrails on the edges.  Have any leaves, snow, or ice cleared regularly.  Use sand or salt on walking paths during winter.  Clean up any spills in your garage right away. This includes oil or grease spills. What can I do in the bathroom?  Use night lights.  Install grab bars by the toilet and in the tub and shower. Do not use towel bars as grab bars.  Use non-skid mats or decals in the tub or shower.  If you need to sit down in the shower, use a plastic, non-slip stool.  Keep the floor  dry. Clean up any water that spills on the floor as soon as it happens.  Remove soap buildup in the tub or shower regularly.  Attach bath mats securely with double-sided non-slip rug tape.  Do not have throw rugs and other things on the floor that can make you trip. What can I do in the bedroom?  Use night lights.  Make sure that you have a light by your bed that is easy to reach.  Do not use any sheets or blankets that are too big for your bed. They should not hang down onto the floor.  Have a firm chair that has side arms. You can use this for support while you get dressed.  Do not have throw rugs and other things on the floor that can make you trip. What can I do in the kitchen?  Clean up any spills right away.  Avoid walking on wet floors.  Keep items that you use a lot in easy-to-reach places.  If you need to reach something above you, use a strong step stool that has a grab bar.  Keep electrical cords out of the way.  Do not  use floor polish or wax that makes floors slippery. If you must use wax, use non-skid floor wax.  Do not have throw rugs and other things on the floor that can make you trip. What can I do with my stairs?  Do not leave any items on the stairs.  Make sure that there are handrails on both sides of the stairs and use them. Fix handrails that are broken or loose. Make sure that handrails are as long as the stairways.  Check any carpeting to make sure that it is firmly attached to the stairs. Fix any carpet that is loose or worn.  Avoid having throw rugs at the top or bottom of the stairs. If you do have throw rugs, attach them to the floor with carpet tape.  Make sure that you have a light switch at the top of the stairs and the bottom of the stairs. If you do not have them, ask someone to add them for you. What else can I do to help prevent falls?  Wear shoes that:  Do not have high heels.  Have rubber bottoms.  Are comfortable and fit you  well.  Are closed at the toe. Do not wear sandals.  If you use a stepladder:  Make sure that it is fully opened. Do not climb a closed stepladder.  Make sure that both sides of the stepladder are locked into place.  Ask someone to hold it for you, if possible.  Clearly mark and make sure that you can see:  Any grab bars or handrails.  First and last steps.  Where the edge of each step is.  Use tools that help you move around (mobility aids) if they are needed. These include:  Canes.  Walkers.  Scooters.  Crutches.  Turn on the lights when you go into a dark area. Replace any light bulbs as soon as they burn out.  Set up your furniture so you have a clear path. Avoid moving your furniture around.  If any of your floors are uneven, fix them.  If there are any pets around you, be aware of where they are.  Review your medicines with your doctor. Some medicines can make you feel dizzy. This can increase your chance of falling. Ask your doctor what other things that you can do to help prevent falls. This information is not intended to replace advice given to you by your health care provider. Make sure you discuss any questions you have with your health care provider. Document Released: 04/28/2009 Document Revised: 12/08/2015 Document Reviewed: 08/06/2014 Elsevier Interactive Patient Education  2017 [employer].

## 2016-11-23 NOTE — Progress Notes (Signed)
   I reviewed health advisor's note, was available for consultation and agree with the assessment and plan as written.    Saryiah Bencosme L. Manie Bealer, D.O. [facility] [address], [place] [address] Cell Phone (Mon-Fri 8am-5pm):  [phone] On Call:  [phone] & follow prompts after 5pm & weekends Office Phone:  [phone] Office Fax:  [phone]   Quick Notes   Health Maintenance: None     Abnormal Screen: MMSE 28/30. Passed clock drawing     Patient Concerns: None     Nurse Concerns: Talked about smoking cessation and decreasing portion sizes.

## 2016-11-23 NOTE — Progress Notes (Signed)
Subjective:   [name] is a 76 y.o. male who presents for Medicare Annual/Subsequent preventive examination.     Objective:    Vitals: BP 128/62 (BP Location: Left Arm, Patient Position: Sitting)   Pulse 73   Temp 97.9 F (36.6 C) (Oral)   Ht '5\' 7"'$  (1.702 m)   Wt 220 lb (99.8 kg)   SpO2 95%   BMI 34.46 kg/m   Body mass index is 34.46 kg/m.  Tobacco History  Smoking Status  . Current Every Day Smoker  . Packs/day: 0.50  . Years: 60.00  . Types: Cigars  . Start date: 07/16/1958  Smokeless Tobacco  . Former Systems developer  . Types: Chew     Ready to quit: Not Answered Counseling given: Not Answered   Past Medical History:  Diagnosis Date  . A-fib ([place])   . Acute appendicitis with peritoneal abscess 01/28/2013  . Arthritis   . Bacteremia September 2015  . Elevated PSA   . Essential hypertension 04/16/2014  . Heart murmur    childhood  . Ileus, postoperative ([place]) 01/28/2013  . Lung cancer ([place])   . Obesity, morbid ([place]) 01/28/2013  . Shortness of breath   . Tobacco use disorder 01/28/2013   Past Surgical History:  Procedure Laterality Date  . APPENDECTOMY    . [place], 2003   Dr.Apleton   . CARPAL TUNNEL RELEASE    . COLONOSCOPY     x 3  . EYE SURGERY Left    muscle of the eye   . KNEE SURGERY Bilateral   . LAPAROSCOPIC APPENDECTOMY N/A 01/25/2013   Procedure: APPENDECTOMY LAPAROSCOPIC;  Surgeon: Ralene Ok, MD;  Location: [facility];  Service: General;  Laterality: N/A;  . LYMPH NODE DISSECTION Left 04/26/2015   Procedure: LYMPH NODE DISSECTION;  Surgeon: Grace Isaac, MD;  Location: [facility];  Service: Thoracic;  Laterality: Left;  . SHOULDER SURGERY Right 2014   Dr.Whitfield rotator cuff surgery  . TEE WITHOUT CARDIOVERSION N/A 04/07/2014   Procedure: TRANSESOPHAGEAL ECHOCARDIOGRAM (TEE);  Surgeon: Candee Furbish, MD;  Location: [facility] ENDOSCOPY;  Service: Cardiovascular;  Laterality: N/A;  . VIDEO ASSISTED THORACOSCOPY (VATS)/WEDGE RESECTION Left  04/26/2015   Procedure: LEFT VIDEO ASSISTED THORACOSCOPY (VATS) WITH LEFT LOWER LOBECTOMY;  Surgeon: Grace Isaac, MD;  Location: [facility];  Service: Thoracic;  Laterality: Left;  Marland Kitchen VIDEO BRONCHOSCOPY N/A 04/26/2015   Procedure: VIDEO BRONCHOSCOPY;  Surgeon: Grace Isaac, MD;  Location: [facility] OR;  Service: Thoracic;  Laterality: N/A;   Family History  Problem Relation Age of Onset  . Irritable bowel syndrome Mother   . Cancer Neg Hx   . Lung disease Neg Hx   . Colon cancer Neg Hx   . Colon polyps Neg Hx    History  Sexual Activity  . Sexual activity: Not Currently    Outpatient Encounter Prescriptions as of 11/23/2016  Medication Sig  . hydrALAZINE (APRESOLINE) 25 MG tablet Take one tablet by mouth three times daily for blood pressure  . losartan (COZAAR) 50 MG tablet TAKE 1 TABLET BY MOUTH EVERY DAY FOR BLOOD PRESSURE  . metoprolol tartrate (LOPRESSOR) 25 MG tablet TAKE 1/2 TABLET(12.5 MG) BY MOUTH TWICE DAILY  . [DISCONTINUED] furosemide (LASIX) 20 MG tablet Take 1 tablet (20 mg total) by mouth daily as needed for edema. (Patient not taking: Reported on 11/19/2016)   No facility-administered encounter medications on file as of 11/23/2016.     Activities of Daily Living In your present state  of health, do you have any difficulty performing the following activities: 11/23/2016  Hearing? N  Vision? N  Difficulty concentrating or making decisions? N  Walking or climbing stairs? Y  Dressing or bathing? N  Doing errands, shopping? N  Preparing Food and eating ? N  Using the Toilet? N  In the past six months, have you accidently leaked urine? N  Do you have problems with loss of bowel control? N  Managing your Medications? N  Managing your Finances? N  Housekeeping or managing your Housekeeping? N  Some recent data might be hidden    Patient Care Team: [name], NP as PCP - General (Nurse Practitioner) Druscilla Brownie, MD as Consulting Physician (Dermatology)     Assessment:    Exercise Activities and Dietary recommendations Current Exercise Habits: Home exercise routine, Type of exercise: stretching (ab work), Time (Minutes): 15, Frequency (Times/Week): 3, Weekly Exercise (Minutes/Week): 45, Intensity: Mild, Exercise limited by: None identified  Goals    . Quit smoking / using tobacco    . Reduce portion size          Starting 11/23/2016 I will reduce portion size and increase water intake to 3-4 cups of water a day.      Fall Risk Fall Risk  11/23/2016 11/19/2016 08/20/2016 08/03/2016 05/07/2016  Falls in the past year? No No No No No  Number falls in past yr: - - - - -  Injury with Fall? - - - - -   Depression Screen PHQ 2/9 Scores 11/23/2016 08/20/2016 02/14/2016 03/22/2015  PHQ - 2 Score 0 0 0 0    Cognitive Function MMSE - Mini Mental State Exam 11/23/2016 02/14/2016 07/29/2014  Orientation to time '5 5 5  '$ Orientation to Place '5 5 5  '$ Registration '3 3 3  '$ Attention/ Calculation '4 5 5  '$ Recall '2 3 3  '$ Language- name 2 objects '2 2 2  '$ Language- repeat '1 1 1  '$ Language- follow 3 step command '3 3 3  '$ Language- read & follow direction '1 1 1  '$ Write a sentence 1 0 1  Copy design '1 1 1  '$ Total score '28 29 30        '$ Immunization History  Administered Date(s) Administered  . Influenza,inj,quad, With Preservative 03/31/2016  . Influenza-Unspecified 04/29/2014  . Pneumococcal Conjugate-13 02/14/2016  . Pneumococcal-Unspecified 04/29/2014   Screening Tests Health Maintenance  Topic Date Due  . TETANUS/TDAP  06/11/1962  . COLON CANCER SCREENING ANNUAL FOBT  10/18/2016  . INFLUENZA VACCINE  02/13/2017  . COLONOSCOPY  10/18/2020  . PNA vac Low Risk Adult  Completed      Plan:    I have personally reviewed and addressed the Medicare Annual Wellness questionnaire and have noted the following in the patient's chart:  A. Medical and social history B. Use of alcohol, tobacco or illicit drugs  C. Current medications and supplements D. Functional  ability and status E.  Nutritional status F.  Physical activity G. Advance directives H. List of other physicians I.  Hospitalizations, surgeries, and ER visits in previous 12 months J.  [place] to include hearing, vision, cognitive, depression L. Referrals and appointments - none  In addition, I have reviewed and discussed with patient certain preventive protocols, quality metrics, and best practice recommendations. A written personalized care plan for preventive services as well as general preventive health recommendations were provided to patient.  See attached scanned questionnaire for additional information.   Signed,   Rich Reining, RN  Nurse Health Advisor

## 2017-02-08 ENCOUNTER — Other Ambulatory Visit: Payer: Self-pay | Admitting: Nurse Practitioner

## 2017-02-22 ENCOUNTER — Other Ambulatory Visit: Payer: Self-pay | Admitting: *Deleted

## 2017-02-22 DIAGNOSIS — Z902 Acquired absence of lung [part of]: Secondary | ICD-10-CM

## 2017-04-04 ENCOUNTER — Encounter: Payer: PPO | Admitting: Cardiothoracic Surgery

## 2017-04-04 ENCOUNTER — Other Ambulatory Visit: Payer: PPO

## 2017-04-06 ENCOUNTER — Other Ambulatory Visit: Payer: Self-pay | Admitting: Cardiology

## 2017-04-06 ENCOUNTER — Other Ambulatory Visit: Payer: Self-pay | Admitting: Nurse Practitioner

## 2017-04-18 ENCOUNTER — Ambulatory Visit (INDEPENDENT_AMBULATORY_CARE_PROVIDER_SITE_OTHER): Payer: PPO | Admitting: Cardiothoracic Surgery

## 2017-04-18 ENCOUNTER — Encounter: Payer: Self-pay | Admitting: Cardiothoracic Surgery

## 2017-04-18 ENCOUNTER — Ambulatory Visit
Admission: RE | Admit: 2017-04-18 | Discharge: 2017-04-18 | Disposition: A | Discharge status: Home / Self Care | Payer: PPO | Source: Ambulatory Visit | Attending: Cardiothoracic Surgery | Admitting: Cardiothoracic Surgery

## 2017-04-18 VITALS — BP 127/63 | HR 86 | Ht 67.0 in | Wt 216.0 lb

## 2017-04-18 DIAGNOSIS — Z902 Acquired absence of lung [part of]: Secondary | ICD-10-CM

## 2017-04-18 DIAGNOSIS — C3492 Malignant neoplasm of unspecified part of left bronchus or lung: Secondary | ICD-10-CM

## 2017-04-18 DIAGNOSIS — C3490 Malignant neoplasm of unspecified part of unspecified bronchus or lung: Secondary | ICD-10-CM | POA: Diagnosis not present

## 2017-04-18 NOTE — Progress Notes (Signed)
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 PROCEDURE: Left lower lobectomy and lymph node dissection, video bronchoscopy, left video-assisted thoracoscopy, and placement of On-Q. 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 This lesion was rapidly progressing from a negative lung cancer screening CT in January 2016to a significant lesion that was resected in August 2016  He has no symptoms of shortness of breath, or hemoptysis. Back playing golf, riding a motor cycle- Honda gold wing motorcycle which he just bought.   Past Medical History:  Diagnosis Date  . A-fib ([place])   . Acute appendicitis with peritoneal abscess 01/28/2013  . Arthritis   . Bacteremia September 2015  . Elevated PSA   . Essential hypertension 04/16/2014  . Heart murmur    childhood  . Ileus, postoperative ([place]) 01/28/2013  . Lung cancer ([place])   . Obesity, morbid ([place]) 01/28/2013  . Shortness of breath   . Tobacco use disorder 01/28/2013     History  Smoking Status  . Current Every Day Smoker  . Packs/day: 0.50  . Years: 60.00  . Types: Cigars  . Start date: 07/16/1958  Smokeless Tobacco  . Former Systems developer  . Types: Chew    History  Alcohol Use No      Allergies  Allergen Reactions  . Asa [Aspirin] Anaphylaxis and Swelling    Angioedema/Eyes and lip swelling  . Nsaids Hives  . Tolmetin Hives  . Naproxen Hives    Current Outpatient Prescriptions  Medication Sig Dispense Refill  . hydrALAZINE (APRESOLINE) 25 MG tablet TAKE 1 TABLET BY MOUTH THREE TIMES DAILY FOR BLOOD PRESSURE 90 tablet 5  . losartan (COZAAR) 50 MG tablet TAKE 1 TABLET BY MOUTH EVERY DAY FOR BLOOD PRESSURE 90 tablet 1  . metoprolol tartrate (LOPRESSOR) 25 MG tablet TAKE 1/2 TABLET(12.5 MG) BY MOUTH TWICE DAILY 90 tablet 1   No current facility-administered medications for this visit.        Physical Exam: BP 127/63   Pulse 86   Ht 5\' 7"  (1.702 m)   Wt 216 lb (98 kg)   SpO2 93%   BMI 33.83 kg/m   General appearance: alert, cooperative and no distress Neurologic: intact Heart: regular rate and rhythm, S1, S2 normal, no murmur, click, rub or gallop Lungs: clear to auscultation bilaterally Abdomen: soft, non-tender; bowel sounds normal; no masses,  no organomegaly Extremities: extremities normal, atraumatic, no cyanosis or edema and Homans sign is negative, no sign of DVT Wound: wounds healed  No cervical or supraclavicular adenopathy noted  Diagnostic Studies & Laboratory data:     Recent Radiology Findings:  Ct Chest Wo Contrast  Result Date:  04/18/2017 CLINICAL DATA:  Restaging small cell lung cancer. EXAM: CT CHEST WITHOUT CONTRAST TECHNIQUE: Multidetector CT imaging of the chest was performed following the standard protocol without IV contrast. COMPARISON:  CT scan 09/13/2016 FINDINGS: Cardiovascular: The heart is normal in size. No pericardial effusion. Mild tortuosity and calcification of the thoracic aorta is stable. Stable coronary artery calcifications. Mediastinum/Nodes: No mediastinal or hilar mass or lymphadenopathy. Small scattered lymph nodes are stable. The esophagus is grossly normal. Lungs/Pleura: Stable surgical changes from a left  lower lobe lobectomy. No findings suspicious for recurrent tumor. No metastatic pulmonary nodules or acute pulmonary findings. Chronic left basilar pleural effusion with overlying scarring changes. Upper Abdomen: No significant upper abdominal findings. Stable hepatic cysts. No adrenal gland lesions. Musculoskeletal: Stable subcutaneous nodules involving the posterior chest wall. No supraclavicular or axillary adenopathy. No significant bony findings. IMPRESSION: 1. Stable CT appearance of the chest. Postsurgical changes noted from a left lower lobe lobectomy without CT findings for recurrent tumor, mediastinal/hilar adenopathy or metastatic pulmonary nodules. 2. No findings to suggest metastatic disease involving upper abdomen or bony structures. 3. Stable atherosclerotic calcifications involving the aorta and coronary arteries. Aortic Atherosclerosis (ICD10-I70.0) and Emphysema (ICD10-J43.9). Electronically Signed   By: Marijo Sanes M.D.   On: 04/18/2017 16:20   Ct Chest Wo Contrast  Result Date: 09/13/2016 CLINICAL DATA:  Status post left lower lobectomy 04/26/2015 for sarcomatoid lung carcinoma, presenting for restaging. Current smoker. EXAM: CT CHEST WITHOUT CONTRAST TECHNIQUE: Multidetector CT imaging of the chest was performed following the standard protocol without IV contrast. COMPARISON:  02/23/2016 chest CT. FINDINGS: Cardiovascular: Normal heart size. No significant pericardial fluid/thickening. Left anterior descending, left circumflex and right coronary atherosclerosis. Atherosclerotic nonaneurysmal thoracic aorta. Stable top-normal caliber main pulmonary artery (3.1 cm diameter). Mediastinum/Nodes: No discrete thyroid nodules. Unremarkable esophagus. No pathologically enlarged axillary, mediastinal or gross hilar lymph nodes, noting limited sensitivity for the detection of hilar adenopathy on this noncontrast study. Lungs/Pleura: No pneumothorax. No right pleural effusion. Status post left lower  lobectomy. Tiny loculated dependent basilar left pleural effusion is decreased. Tiny peripheral right upper lobe 3 mm pulmonary nodule (series 4/ image 38) is stable back to 09/26/2015 and considered benign. Stable small parenchymal bands at the left lung base compatible with mild postinfectious scarring. Stable 2 mm calcified granuloma in the peripheral left upper lobe. No acute consolidative airspace disease, lung masses or new significant pulmonary nodules. Upper abdomen: Simple 1.9 cm left liver lobe cyst. Musculoskeletal: No aggressive appearing focal osseous lesions. Moderate thoracic spondylosis. No appreciable change and circumscribed superficial subcutaneous nodules in the bilateral back of variable density, largest 4.0 cm in the right back, most compatible with sebaceous cysts. IMPRESSION: 1. No evidence of local tumor recurrence in the left lung status post left lower lobectomy. 2. No evidence of metastatic disease in the chest. 3. Continued reduction in size of tiny loculated basilar left pleural effusion. 4. Aortic atherosclerosis.  Three-vessel coronary atherosclerosis. Electronically Signed   By: Ilona Sorrel M.D.   On: 09/13/2016 15:30   Ct Chest Wo Contrast  Result Date: 02/23/2016 CLINICAL DATA:  History of lung cancer status post left lower lobe wedge resection 04/26/2015. EXAM: CT CHEST WITHOUT CONTRAST TECHNIQUE: Multidetector CT imaging of the chest was performed following the standard protocol without IV contrast. COMPARISON:  Chest CT 09/26/2015 and PET CT 03/07/2015. FINDINGS: Cardiovascular: Atherosclerosis of aorta, great vessels and coronary arteries again noted. No acute vascular findings are demonstrated on noncontrast imaging. The heart size is normal. There is  no pericardial effusion. Mediastinum/Nodes: Small mediastinal lymph nodes are unchanged. Hilar assessment is limited by the lack of intravenous contrast, although the hilar contours appear unchanged. The thyroid gland,  trachea and esophagus demonstrate no significant findings. Lungs/Pleura: Small left pleural effusion has decreased in volume. There is no significant pleural fluid on the right. Stable postsurgical changes status post left lower lobe resection. There is a stable calcified lingular granuloma. There is a stable small perifissural nodule along the minor fissure on image 63. There is a stable tiny right upper lobe nodule on image 34. No suspicious pulmonary nodules. Upper abdomen: The visualized upper abdomen appears stable without suspicious findings. Low-density hepatic lesion is unchanged, likely a cyst. Musculoskeletal/Chest wall: There is no chest wall mass or suspicious osseous finding. Thoracotomy defects noted on the left. Several probable epidermal inclusion (sebaceous) cysts again noted posteriorly in the right chest wall. IMPRESSION: 1. Interval improvement in small loculated left pleural effusion following left lower lobe resection. 2. No evidence of local recurrence or metastatic disease. 3. Atherosclerosis, as before. Electronically Signed   By: Richardean Sale M.D.   On: 02/23/2016 11:52   I have independently reviewed the above radiology studies  and reviewed the findings with the patient.     Ct Chest Wo Contrast  09/26/2015  CLINICAL DATA:  Left lower lobe lung cancer and resection. Weight loss. EXAM: CT CHEST WITHOUT CONTRAST TECHNIQUE: Multidetector CT imaging of the chest was performed following the standard protocol without IV contrast. COMPARISON:  PET 03/07/2015 and CT chest 08/05/2014. FINDINGS: Mediastinum/Nodes: Mediastinal lymph nodes are not enlarged by CT size criteria. Hilar regions are difficult to definitively evaluate without IV contrast. No axillary adenopathy. Atherosclerotic calcification of the arterial vasculature, including three-vessel involvement of the coronary arteries. Heart size normal. No pericardial effusion. Lungs/Pleura: Postoperative changes of left lower  lobectomy with a small left pleural effusion. Minimal linear volume loss in the left upper lobe. Calcified granuloma in the lingula. 4 mm right upper lobe nodule (4/19), unchanged. Airway is otherwise unremarkable. Upper abdomen: Low-attenuation lesion in the dome of the liver measures 1.8 cm, as before. Visualized portions of the liver, gallbladder, adrenal glands, kidneys, spleen, pancreas, stomach and bowel are otherwise grossly unremarkable. No upper abdominal adenopathy. Musculoskeletal: No worrisome lytic or sclerotic lesions. Degenerative changes are seen in the spine. Thoracotomy changes on the left. Probable subcutaneous sebaceous cysts overlying the right scapula and posterior right ribs. IMPRESSION: 1. Postoperative changes of left lower lobectomy without evidence of recurrent or metastatic disease. 2. Small left pleural effusion. 3. Three-vessel coronary artery calcification. Electronically Signed   By: Lorin Picket M.D.   On: 09/26/2015 14:18      Recent Lab Findings: Lab Results  Component Value Date   WBC 9.1 11/23/2016   HGB 16.4 11/23/2016   HCT 47.8 11/23/2016   PLT 253 11/23/2016   GLUCOSE 98 11/23/2016   CHOL 161 11/23/2016   TRIG 143 11/23/2016   HDL 32 (L) 11/23/2016   LDLCALC 100 (H) 11/23/2016   ALT 19 08/03/2016   AST 23 08/03/2016   NA 139 11/23/2016   K 4.8 11/23/2016   CL 106 11/23/2016   CREATININE 0.94 11/23/2016   BUN 23 11/23/2016   CO2 27 11/23/2016   TSH 1.01 02/08/2016   INR 1.00 04/20/2015   HGBA1C 5.3 07/27/2014      Assessment / Plan:    Patient doing well  postop now about A year and a half  since left lower lobectomy for  a 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

## 2017-04-22 ENCOUNTER — Encounter: Payer: Self-pay | Admitting: Nurse Practitioner

## 2017-04-22 ENCOUNTER — Ambulatory Visit (INDEPENDENT_AMBULATORY_CARE_PROVIDER_SITE_OTHER): Payer: PPO | Admitting: Nurse Practitioner

## 2017-04-22 VITALS — BP 120/72 | HR 72 | Temp 98.2°F | Resp 18 | Ht 67.0 in | Wt 228.2 lb

## 2017-04-22 DIAGNOSIS — Z720 Tobacco use: Secondary | ICD-10-CM | POA: Diagnosis not present

## 2017-04-22 DIAGNOSIS — Z85118 Personal history of other malignant neoplasm of bronchus and lung: Secondary | ICD-10-CM

## 2017-04-22 DIAGNOSIS — I739 Peripheral vascular disease, unspecified: Secondary | ICD-10-CM | POA: Diagnosis not present

## 2017-04-22 DIAGNOSIS — I10 Essential (primary) hypertension: Secondary | ICD-10-CM

## 2017-04-22 DIAGNOSIS — H6123 Impacted cerumen, bilateral: Secondary | ICD-10-CM

## 2017-04-22 DIAGNOSIS — R972 Elevated prostate specific antigen [PSA]: Secondary | ICD-10-CM

## 2017-04-22 DIAGNOSIS — E782 Mixed hyperlipidemia: Secondary | ICD-10-CM

## 2017-04-22 NOTE — Progress Notes (Signed)
Careteam: Patient Care Team: [name], NP as PCP - General (Nurse Practitioner) Druscilla Brownie, MD as Consulting Physician (Dermatology)  Advanced Directive information Does Patient Have a Medical Advance Directive?: No  Allergies  Allergen Reactions  . Asa [Aspirin] Anaphylaxis and Swelling    Angioedema/Eyes and lip swelling  . Nsaids Hives  . Tolmetin Hives  . Naproxen Hives    Chief Complaint  Patient presents with  . Medical Management of Chronic Issues    Pt is being seen for a 5 month routine visit. Pt states he needs to have ears changed. Pt also states that his legs have been getting tired and giving out on him.      HPI: Patient is a 75 y.o. male seen in the office today routine follow up. Patient has history of afib, arthritis, and lung cancer. Has been doing well. He had follow up with Dr. Servando Snare for lung resection. Says follow up for lung cancer went well, and does not have to go back for follow up with Dr. Servando Snare for another 8 months.  Pt reports that he has had some bilateral leg weakness that has interefearred with his activity. He says that his "legs will give out." He says that he can not walk more than 10 yards without having to take a rest due to bilateral leg fatigue x 3 months.  Patient admits to smoking 1/2 pack a day; has cut back.   Mr. Pracht says he has problems with wax build up in his ears and says he has issues with hearing. Wears hearing aids. Denies pain.   Review of Systems:  Review of Systems  Constitutional: Negative for chills, diaphoresis, fever, malaise/fatigue and weight loss.  HENT: Negative for congestion, ear discharge, ear pain, hearing loss, sinus pain, sore throat and tinnitus.   Eyes: Negative for blurred vision, double vision, photophobia, pain, discharge and redness.  Respiratory: Negative for cough, hemoptysis, sputum production, shortness of breath and wheezing.   Cardiovascular: Negative for chest pain,  palpitations, claudication and leg swelling.  Gastrointestinal: Negative for abdominal pain, blood in stool, constipation, diarrhea, heartburn, nausea and vomiting.  Genitourinary: Negative for dysuria, frequency and urgency.  Musculoskeletal: Negative for back pain, falls, joint pain, myalgias and neck pain.  Neurological: Positive for tingling (to hands occasionally with position, resolves quickly). Negative for dizziness, sensory change, speech change, focal weakness, loss of consciousness, weakness and headaches.  Psychiatric/Behavioral: Negative for depression, hallucinations, memory loss, substance abuse and suicidal ideas. The patient is not nervous/anxious and does not have insomnia.     Past Medical History:  Diagnosis Date  . A-fib ([place])   . Acute appendicitis with peritoneal abscess 01/28/2013  . Arthritis   . Bacteremia September 2015  . Elevated PSA   . Essential hypertension 04/16/2014  . Heart murmur    childhood  . Ileus, postoperative ([place]) 01/28/2013  . Lung cancer ([place])   . Obesity, morbid ([place]) 01/28/2013  . Shortness of breath   . Tobacco use disorder 01/28/2013   Past Surgical History:  Procedure Laterality Date  . APPENDECTOMY    . [place], 2003   Dr.Apleton   . CARPAL TUNNEL RELEASE    . COLONOSCOPY     x 3  . EYE SURGERY Left    muscle of the eye   . KNEE SURGERY Bilateral   . LAPAROSCOPIC APPENDECTOMY N/A 01/25/2013   Procedure: APPENDECTOMY LAPAROSCOPIC;  Surgeon: Ralene Ok, MD;  Location: [place];  Service:  General;  Laterality: N/A;  . LYMPH NODE DISSECTION Left 04/26/2015   Procedure: LYMPH NODE DISSECTION;  Surgeon: Grace Isaac, MD;  Location: [place];  Service: Thoracic;  Laterality: Left;  . SHOULDER SURGERY Right 2014   Dr.Whitfield rotator cuff surgery  . TEE WITHOUT CARDIOVERSION N/A 04/07/2014   Procedure: TRANSESOPHAGEAL ECHOCARDIOGRAM (TEE);  Surgeon: Candee Furbish, MD;  [facility] ENDOSCOPY;  Service: Cardiovascular;   Laterality: N/A;  . VIDEO ASSISTED THORACOSCOPY (VATS)/WEDGE RESECTION Left 04/26/2015   Procedure: LEFT VIDEO ASSISTED THORACOSCOPY (VATS) WITH LEFT LOWER LOBECTOMY;  Surgeon: Grace Isaac, MD;  Location: [place];  Service: Thoracic;  Laterality: Left;  Marland Kitchen VIDEO BRONCHOSCOPY N/A 04/26/2015   Procedure: VIDEO BRONCHOSCOPY;  Surgeon: Grace Isaac, MD;  Location: [place];  Service: Thoracic;  Laterality: N/A;   Social History:   reports that he has been smoking Cigars.  He started smoking about 58 years ago. He has a 30.00 pack-year smoking history. He has quit using smokeless tobacco. His smokeless tobacco use included Chew. He reports that he does not drink alcohol or use drugs.  Family History  Problem Relation Age of Onset  . Irritable bowel syndrome Mother   . Cancer Neg Hx   . Lung disease Neg Hx   . Colon cancer Neg Hx   . Colon polyps Neg Hx     Medications: Patient's Medications  New Prescriptions   No medications on file  Previous Medications   HYDRALAZINE (APRESOLINE) 25 MG TABLET    TAKE 1 TABLET BY MOUTH THREE TIMES DAILY FOR BLOOD PRESSURE   LOSARTAN (COZAAR) 50 MG TABLET    TAKE 1 TABLET BY MOUTH EVERY DAY FOR BLOOD PRESSURE   METOPROLOL TARTRATE (LOPRESSOR) 25 MG TABLET    TAKE 1/2 TABLET(12.5 MG) BY MOUTH TWICE DAILY  Modified Medications   No medications on file  Discontinued Medications   No medications on file     Physical Exam:  Vitals:   04/22/17 0829  BP: 120/72  Pulse: 72  Resp: 18  Temp: 98.2 F (36.8 C)  TempSrc: Oral  SpO2: 96%  Weight: 228 lb 3.2 oz (103.5 kg)  Height: '5\' 7"'  (1.702 m)   Body mass index is 35.74 kg/m.  Physical Exam  Constitutional: He is oriented to person, place, and time. He appears well-developed and well-nourished. No distress.  HENT:  Head: Normocephalic and atraumatic.  Bilateral cerumen impaction.  Eyes: Pupils are equal, round, and reactive to light. Conjunctivae and EOM are normal. Right eye exhibits no  discharge. Left eye exhibits no discharge. No scleral icterus.  Neck: Normal range of motion. Neck supple. No JVD present. No tracheal deviation present. No thyromegaly present.  Cardiovascular: Normal rate, regular rhythm and normal heart sounds.  Exam reveals decreased pulses (Pedal pulses). Exam reveals no gallop and no friction rub.   No murmur heard. Pulmonary/Chest: Effort normal and breath sounds normal. No stridor. No respiratory distress. He has no wheezes. He has no rales. He exhibits no tenderness.  Abdominal: Soft. Bowel sounds are normal. He exhibits no distension and no mass. There is no tenderness. There is no rebound and no guarding. No hernia.  Musculoskeletal: Normal range of motion. He exhibits no edema, tenderness or deformity.  Lymphadenopathy:    He has no cervical adenopathy.  Neurological: He is alert and oriented to person, place, and time.  Skin: Skin is warm and dry. Capillary refill takes less than 2 seconds. He is not diaphoretic.  Psychiatric: He has a  normal mood and affect. His behavior is normal. Judgment and thought content normal.    Labs reviewed: Basic Metabolic Panel:  Recent Labs  08/03/16 1453 11/23/16 0806  NA 140 139  K 5.0 4.8  CL 107 106  CO2 28 27  GLUCOSE 81 98  BUN 14 23  CREATININE 0.85 0.94  CALCIUM 9.7 9.0   Liver Function Tests:  Recent Labs  08/03/16 1453  AST 23  ALT 19  ALKPHOS 82  BILITOT 0.4  PROT 7.3  ALBUMIN 4.3   No results for input(s): LIPASE, AMYLASE in the last 8760 hours. No results for input(s): AMMONIA in the last 8760 hours. CBC:  Recent Labs  08/03/16 1453 11/23/16 0806  WBC 8.0 9.1  NEUTROABS 5,360 6,734  HGB 17.3* 16.4  HCT 50.7* 47.8  MCV 95.1 95.8  PLT 222 253   Lipid Panel:  Recent Labs  11/23/16 0806  CHOL 161  HDL 32*  LDLCALC 100*  TRIG 143  CHOLHDL 5.0*   TSH: No results for input(s): TSH in the last 8760 hours. A1C: Lab Results  Component Value Date   HGBA1C 5.3  07/27/2014     Assessment/Plan 1. History of lung cancer Status post lung resection for cancer and now follow up with Dr.Gerhardt. Patient to follow up in 8 months.   2. Tobacco abuse Patient smoke 1/2 pack per day with no plans to quit, encouraged smoking cessation.   3. Essential hypertension Controlled with hydralazine, losartan and metoprolol. Cont current regimen with diet and medications.  - CMP with eGFR; Future  4. Elevated PSA -elevated in the past however recently improved, will follow up.  - PSA; Future  5. Intermittent claudication (HCC) Bilateral lower extremity pain and fatigue with ambulation, improves with rest.  Decreased LE pulses noted. Will get VAS [place] ABI WITH/WO TBI to further evaluate.   6. Mixed hyperlipidemia Controled with diet. - Lipid Panel; Future  7. Cerumen impaction Bilateral impaction. Has used use debrox at home to loosen cerumen. Bilateral lavage, tolerated well, used curette on left ear with good success.   Next appt: 3 months.  Carlos American. Harle Battiest  [facility] [phone] 8 am - 5 pm) [phone] (after hours)

## 2017-04-22 NOTE — Patient Instructions (Addendum)
Schedule fasting labs for this week    Steps to Quit Smoking Smoking tobacco can be bad for your health. It can also affect almost every organ in your body. Smoking puts you and people around you at risk for many serious long-lasting (chronic) diseases. Quitting smoking is hard, but it is one of the best things that you can do for your health. It is never too late to quit. What are the benefits of quitting smoking? When you quit smoking, you lower your risk for getting serious diseases and conditions. They can include:  Lung cancer or lung disease.  Heart disease.  Stroke.  Heart attack.  Not being able to have children (infertility).  Weak bones (osteoporosis) and broken bones (fractures).  If you have coughing, wheezing, and shortness of breath, those symptoms may get better when you quit. You may also get sick less often. If you are pregnant, quitting smoking can help to lower your chances of having a baby of low birth weight. What can I do to help me quit smoking? Talk with your doctor about what can help you quit smoking. Some things you can do (strategies) include:  Quitting smoking totally, instead of slowly cutting back how much you smoke over a period of time.  Going to in-person counseling. You are more likely to quit if you go to many counseling sessions.  Using resources and support systems, such as: ? Database administrator with a Social worker. ? Phone quitlines. ? Careers information officer. ? Support groups or group counseling. ? Text messaging programs. ? Mobile phone apps or applications.  Taking medicines. Some of these medicines may have nicotine in them. If you are pregnant or breastfeeding, do not take any medicines to quit smoking unless your doctor says it is okay. Talk with your doctor about counseling or other things that can help you.  Talk with your doctor about using more than one strategy at the same time, such as taking medicines while you are also going to  in-person counseling. This can help make quitting easier. What things can I do to make it easier to quit? Quitting smoking might feel very hard at first, but there is a lot that you can do to make it easier. Take these steps:  Talk to your family and friends. Ask them to support and encourage you.  Call phone quitlines, reach out to support groups, or work with a Social worker.  Ask people who smoke to not smoke around you.  Avoid places that make you want (trigger) to smoke, such as: ? Bars. ? Parties. ? Smoke-break areas at work.  Spend time with people who do not smoke.  Lower the stress in your life. Stress can make you want to smoke. Try these things to help your stress: ? Getting regular exercise. ? Deep-breathing exercises. ? Yoga. ? Meditating. ? Doing a body scan. To do this, close your eyes, focus on one area of your body at a time from head to toe, and notice which parts of your body are tense. Try to relax the muscles in those areas.  Download or buy apps on your mobile phone or tablet that can help you stick to your quit plan. There are many free apps, such as QuitGuide from the [employer] Office manager for Disease Control and Prevention). You can find more support from smokefree.gov and other websites.  This information is not intended to replace advice given to you by your health care provider. Make sure you discuss any questions you have  with your health care provider. Document Released: 04/28/2009 Document Revised: 02/28/2016 Document Reviewed: 11/16/2014 Elsevier Interactive Patient Education  2018 [employer].

## 2017-04-24 ENCOUNTER — Other Ambulatory Visit: Payer: PPO

## 2017-04-24 ENCOUNTER — Ambulatory Visit (HOSPITAL_COMMUNITY)
Admission: RE | Admit: 2017-04-24 | Discharge: 2017-04-24 | Disposition: A | Discharge status: Home / Self Care | Payer: PPO | Source: Ambulatory Visit | Attending: Vascular Surgery | Admitting: Vascular Surgery

## 2017-04-24 DIAGNOSIS — E782 Mixed hyperlipidemia: Secondary | ICD-10-CM

## 2017-04-24 DIAGNOSIS — R972 Elevated prostate specific antigen [PSA]: Secondary | ICD-10-CM

## 2017-04-24 DIAGNOSIS — I10 Essential (primary) hypertension: Secondary | ICD-10-CM

## 2017-04-24 DIAGNOSIS — I739 Peripheral vascular disease, unspecified: Secondary | ICD-10-CM | POA: Diagnosis not present

## 2017-04-24 LAB — COMPLETE METABOLIC PANEL WITH GFR
AG Ratio: 1.5 (calc) (ref 1.0–2.5)
ALBUMIN MSPROF: 4 g/dL (ref 3.6–5.1)
ALT: 16 U/L (ref 9–46)
AST: 18 U/L (ref 10–35)
Alkaline phosphatase (APISO): 72 U/L (ref 40–115)
BUN: 18 mg/dL (ref 7–25)
CALCIUM: 9.2 mg/dL (ref 8.6–10.3)
CO2: 27 mmol/L (ref 20–32)
CREATININE: 0.85 mg/dL (ref 0.70–1.18)
Chloride: 106 mmol/L (ref 98–110)
GFR, EST AFRICAN AMERICAN: 100 mL/min/{1.73_m2} (ref >=60)
GFR, EST NON AFRICAN AMERICAN: 86 mL/min/{1.73_m2} (ref >=60)
GLUCOSE: 109 mg/dL — AB (ref 65–99)
Globulin: 2.7 g/dL (calc) (ref 1.9–3.7)
Potassium: 4.3 mmol/L (ref 3.5–5.3)
Sodium: 139 mmol/L (ref 135–146)
TOTAL PROTEIN: 6.7 g/dL (ref 6.1–8.1)
Total Bilirubin: 0.5 mg/dL (ref 0.2–1.2)

## 2017-04-24 LAB — LIPID PANEL
Cholesterol: 167 mg/dL (ref <200)
HDL: 37 mg/dL — ABNORMAL LOW (ref >40)
LDL Cholesterol (Calc): 101 mg/dL (calc) — ABNORMAL HIGH
Non-HDL Cholesterol (Calc): 130 mg/dL (calc) — ABNORMAL HIGH (ref <130)
TRIGLYCERIDES: 169 mg/dL — AB (ref <150)
Total CHOL/HDL Ratio: 4.5 (calc) (ref <5.0)

## 2017-04-24 LAB — PSA: PSA: 3.6 ng/mL (ref <=4.0)

## 2017-05-03 ENCOUNTER — Telehealth: Payer: Self-pay | Admitting: *Deleted

## 2017-05-03 NOTE — Telephone Encounter (Signed)
Results were printed for [name] to review on Monday.   I spoke with patient to let him know that [name] would be reviewing report on Monday and that I would call him with results. Patient verbalized understanding and agreed.

## 2017-05-03 NOTE — Telephone Encounter (Signed)
Patient called requesting his test results from [facility]. Stated that you sent him but he hasn't heard back with the results of the ABIs. Please Advise.

## 2017-05-07 ENCOUNTER — Telehealth: Payer: Self-pay

## 2017-05-07 NOTE — Telephone Encounter (Signed)
I spoke with patient and he verbalized understanding of ABI result. Patient stated that he did go to neurosurgery and was given a nerve block about a year and half ago. Pt is not interested in PT at this time.

## 2017-05-07 NOTE — Telephone Encounter (Signed)
The back could be contributing to the weakness in the legs

## 2017-05-07 NOTE — Telephone Encounter (Signed)
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 would recommend slowly increasing activity, can also consult PT for them to do an evaluation as well if he is agreeable to this.

## 2017-06-03 DIAGNOSIS — M25551 Pain in right hip: Secondary | ICD-10-CM | POA: Diagnosis not present

## 2017-06-10 ENCOUNTER — Encounter: Payer: Self-pay | Admitting: Nurse Practitioner

## 2017-06-10 ENCOUNTER — Ambulatory Visit (INDEPENDENT_AMBULATORY_CARE_PROVIDER_SITE_OTHER): Payer: PPO | Admitting: Nurse Practitioner

## 2017-06-10 VITALS — BP 118/78 | HR 64 | Temp 98.1°F | Resp 17 | Ht 67.0 in | Wt 227.0 lb

## 2017-06-10 DIAGNOSIS — J209 Acute bronchitis, unspecified: Secondary | ICD-10-CM | POA: Diagnosis not present

## 2017-06-10 MED ORDER — CETIRIZINE HCL 10 MG PO TABS: 10.0000 mg | ORAL_TABLET | Freq: Every day | ORAL | Status: DC

## 2017-06-10 MED ORDER — DM-GUAIFENESIN ER 30-600 MG PO TB12: 1.0000 | ORAL_TABLET | Freq: Two times a day (BID) | ORAL | 0 refills | Status: AC

## 2017-06-10 MED ORDER — BENZONATATE 100 MG PO CAPS: 100.0000 mg | ORAL_CAPSULE | Freq: Three times a day (TID) | ORAL | 0 refills | Status: DC | PRN

## 2017-06-10 NOTE — Progress Notes (Signed)
Careteam: Patient Care Team: [name], NP as PCP - General (Nurse Practitioner) Druscilla Brownie, MD as Consulting Physician (Dermatology)  Advanced Directive information Does Patient Have a Medical Advance Directive?: No, Would patient like information on creating a medical advance directive?: No - Patient declined  Allergies  Allergen Reactions  . Asa [Aspirin] Anaphylaxis and Swelling    Angioedema/Eyes and lip swelling  . Nsaids Hives  . Tolmetin Hives  . Naproxen Hives    Chief Complaint  Patient presents with  . Acute Visit    Pt is being seen for cough, congestion, and runny nose x 5 days. Pt reports cough is worse at night and he is unable to sleep. Pt has taken benadryl and it helped ease symptoms. Pt is not currently taking anything.      HPI: Patient is a 76 y.o. male seen in the office today due to not feeling well. Took a friend for pre-op and went into hospital thinks that is where he got sick.  5 days did not feel well when he woke up but started to have a cough which got worse. Also nose has been running. When he sits up things are okay but when he lays down cough gets worse.  Coughing up white sputum. Not thick or yellow.  No sore throat  Using saline twice daily The only thing he has taken for cough was benedryl which helped but he quit taking.      Review of Systems:  Review of Systems  Constitutional: Positive for malaise/fatigue. Negative for chills, fever and weight loss.  HENT: Positive for congestion (chest and sinus). Negative for ear pain, sinus pain, sore throat and tinnitus.   Respiratory: Positive for cough. Negative for sputum production and shortness of breath.   Cardiovascular: Negative for chest pain, palpitations and leg swelling.  Skin: Negative.     Past Medical History:  Diagnosis Date  . A-fib ([place])   . Acute appendicitis with peritoneal abscess 01/28/2013  . Arthritis   . Bacteremia September 2015  . Elevated PSA    . Essential hypertension 04/16/2014  . Heart murmur    childhood  . Ileus, postoperative ([place]) 01/28/2013  . Lung cancer ([place])   . Obesity, morbid ([place]) 01/28/2013  . Shortness of breath   . Tobacco use disorder 01/28/2013   Past Surgical History:  Procedure Laterality Date  . APPENDECTOMY    . [place], 2003   Dr.Apleton   . CARPAL TUNNEL RELEASE    . COLONOSCOPY     x 3  . EYE SURGERY Left    muscle of the eye   . KNEE SURGERY Bilateral   . LAPAROSCOPIC APPENDECTOMY N/A 01/25/2013   Procedure: APPENDECTOMY LAPAROSCOPIC;  Surgeon: Ralene Ok, MD;  Location: [place];  Service: General;  Laterality: N/A;  . LYMPH NODE DISSECTION Left 04/26/2015   Procedure: LYMPH NODE DISSECTION;  Surgeon: Grace Isaac, MD;  Location: [place];  Service: Thoracic;  Laterality: Left;  . SHOULDER SURGERY Right 2014   Dr.Whitfield rotator cuff surgery  . TEE WITHOUT CARDIOVERSION N/A 04/07/2014   Procedure: TRANSESOPHAGEAL ECHOCARDIOGRAM (TEE);  Surgeon: Candee Furbish, MD;  Location: [facility] ENDOSCOPY;  Service: Cardiovascular;  Laterality: N/A;  . VIDEO ASSISTED THORACOSCOPY (VATS)/WEDGE RESECTION Left 04/26/2015   Procedure: LEFT VIDEO ASSISTED THORACOSCOPY (VATS) WITH LEFT LOWER LOBECTOMY;  Surgeon: Grace Isaac, MD;  Location: [place];  Service: Thoracic;  Laterality: Left;  Marland Kitchen VIDEO BRONCHOSCOPY N/A 04/26/2015  Procedure: VIDEO BRONCHOSCOPY;  Surgeon: Grace Isaac, MD;  Location: [facility] OR;  Service: Thoracic;  Laterality: N/A;   Social History:   reports that he has been smoking cigars.  He started smoking about 58 years ago. He has a 30.00 pack-year smoking history. He has quit using smokeless tobacco. His smokeless tobacco use included chew. He reports that he does not drink alcohol or use drugs.  Family History  Problem Relation Age of Onset  . Irritable bowel syndrome Mother   . Cancer Neg Hx   . Lung disease Neg Hx   . Colon cancer Neg Hx   . Colon polyps Neg Hx      Medications:   Medication List        Accurate as of 06/10/17 10:02 AM. Always use your most recent med list.          hydrALAZINE 25 MG tablet Commonly known as:  APRESOLINE TAKE 1 TABLET BY MOUTH THREE TIMES DAILY FOR BLOOD PRESSURE   losartan 50 MG tablet Commonly known as:  COZAAR TAKE 1 TABLET BY MOUTH EVERY DAY FOR BLOOD PRESSURE   metoprolol tartrate 25 MG tablet Commonly known as:  LOPRESSOR TAKE 1/2 TABLET(12.5 MG) BY MOUTH TWICE DAILY        Physical Exam:  Vitals:   06/10/17 0946  BP: 118/78  Pulse: 64  Resp: 17  Temp: 98.1 F (36.7 C)  TempSrc: Oral  SpO2: 97%  Weight: 227 lb (103 kg)  Height: 5\' 7"  (1.702 m)   Body mass index is 35.55 kg/m.  Physical Exam  Constitutional: He is oriented to person, place, and time. He appears well-developed and well-nourished. No distress.  HENT:  Head: Normocephalic and atraumatic.  Right Ear: External ear normal.  Left Ear: External ear normal.  Nose: Nose normal.  Mouth/Throat: Posterior oropharyngeal erythema (no exudate) present. No oropharyngeal exudate or posterior oropharyngeal edema.  Eyes: Conjunctivae and EOM are normal. Pupils are equal, round, and reactive to light.  Neck: Normal range of motion. Neck supple.  Cardiovascular: Normal rate, regular rhythm and normal heart sounds.  Pulmonary/Chest: Effort normal and breath sounds normal.  Abdominal: Soft. Bowel sounds are normal.  Musculoskeletal: He exhibits no edema or tenderness.  Neurological: He is alert and oriented to person, place, and time.  Skin: Skin is warm and dry. He is not diaphoretic.  Psychiatric: He has a normal mood and affect.    Labs reviewed: Basic Metabolic Panel: Recent Labs    08/03/16 1453 11/23/16 0806 04/24/17 0818  NA 140 139 139  K 5.0 4.8 4.3  CL 107 106 106  CO2 28 27 27   GLUCOSE 81 98 109*  BUN 14 23 18   CREATININE 0.85 0.94 0.85  CALCIUM 9.7 9.0 9.2   Liver Function Tests: Recent Labs     08/03/16 1453 04/24/17 0818  AST 23 18  ALT 19 16  ALKPHOS 82  --   BILITOT 0.4 0.5  PROT 7.3 6.7  ALBUMIN 4.3  --    No results for input(s): LIPASE, AMYLASE in the last 8760 hours. No results for input(s): AMMONIA in the last 8760 hours. CBC: Recent Labs    08/03/16 1453 11/23/16 0806  WBC 8.0 9.1  NEUTROABS 5,360 6,734  HGB 17.3* 16.4  HCT 50.7* 47.8  MCV 95.1 95.8  PLT 222 253   Lipid Panel: Recent Labs    11/23/16 0806 04/24/17 0818  CHOL 161 167  HDL 32* 37*  LDLCALC 100*  --  TRIG 143 169*  CHOLHDL 5.0* 4.5   TSH: No results for input(s): TSH in the last 8760 hours. A1C: Lab Results  Component Value Date   HGBA1C 5.3 07/27/2014     Assessment/Plan 1. Acute bronchitis, unspecified organism Appears viral, supportive care at this time.  Plain nasal saline spray throughout the day as needed. Nasal wash twice daily  May use tylenol 325 mg 2 tablets every 6 hours as needed aches and pains or sore throat humidifier in the home to help with the dry air Mucinex DM by mouth twice daily as needed for cough and congestion with full glass of water  Keep well hydrated To use benzonatate 100 mg by mouth every 8 hours as needed cough Avoid forcefully blowing nose - benzonatate (TESSALON) 100 MG capsule; Take 1 capsule (100 mg total) by mouth 3 (three) times daily as needed for cough.  Dispense: 20 capsule; Refill: 0 - dextromethorphan-guaiFENesin (MUCINEX DM) 30-600 MG 12hr tablet; Take 1 tablet by mouth 2 (two) times daily for 7 days.  Dispense: 14 tablet; Refill: 0 - cetirizine (ZYRTEC) 10 MG tablet; Take 1 tablet (10 mg total) by mouth daily for 7 days. -to notify if symptoms worsen or fail to improve  Euretha Najarro K. Harle Battiest  [facility] [phone] 8 am - 5 pm) [phone] (after hours)

## 2017-06-10 NOTE — Patient Instructions (Addendum)
  Cetirizine 10 mg by mouth daily as needed cough/congestion/runny nose Uses saline wash twice daily Plain nasal saline spray throughout the day as needed May use tylenol 325 mg 2 tablets every 6 hours as needed aches and pains or sore throat humidifier in the home to help with the dry air Mucinex DM by mouth twice daily as needed for cough and congestion with full glass of water  Keep well hydrated To use benzonatate 100 mg by mouth every 8 hours as needed cough   Acute Bronchitis, Adult Acute bronchitis is when air tubes (bronchi) in the lungs suddenly get swollen. The condition can make it hard to breathe. It can also cause these symptoms:  A cough.  Coughing up clear, yellow, or green mucus.  Wheezing.  Chest congestion.  Shortness of breath.  A fever.  Body aches.  Chills.  A sore throat.  Follow these instructions at home: Medicines  Take over-the-counter and prescription medicines only as told by your doctor.  If you were prescribed an antibiotic medicine, take it as told by your doctor. Do not stop taking the antibiotic even if you start to feel better. General instructions  Rest.  Drink enough fluids to keep your pee (urine) clear or pale yellow.  Avoid smoking and secondhand smoke. If you smoke and you need help quitting, ask your doctor. Quitting will help your lungs heal faster.  Use an inhaler, cool mist vaporizer, or humidifier as told by your doctor.  Keep all follow-up visits as told by your doctor. This is important. How is this prevented? To lower your risk of getting this condition again:  Wash your hands often with soap and water. If you cannot use soap and water, use hand sanitizer.  Avoid contact with people who have cold symptoms.  Try not to touch your hands to your mouth, nose, or eyes.  Make sure to get the flu shot every year.  Contact a doctor if:  Your symptoms do not get better in 2 weeks. Get help right away if:  You cough  up blood.  You have chest pain.  You have very bad shortness of breath.  You become dehydrated.  You faint (pass out) or keep feeling like you are going to pass out.  You keep throwing up (vomiting).  You have a very bad headache.  Your fever or chills gets worse. This information is not intended to replace advice given to you by your health care provider. Make sure you discuss any questions you have with your health care provider. Document Released: 12/19/2007 Document Revised: 02/08/2016 Document Reviewed: 12/21/2015 Elsevier Interactive Patient Education  2017 [place] [employer].  Avoid forcefully blowing nose

## 2017-07-23 ENCOUNTER — Ambulatory Visit: Payer: PPO | Admitting: Nurse Practitioner

## 2017-07-24 ENCOUNTER — Ambulatory Visit (INDEPENDENT_AMBULATORY_CARE_PROVIDER_SITE_OTHER): Payer: PPO | Admitting: Nurse Practitioner

## 2017-07-24 ENCOUNTER — Encounter: Payer: Self-pay | Admitting: Nurse Practitioner

## 2017-07-24 VITALS — BP 120/76 | HR 77 | Temp 98.2°F | Resp 17 | Ht 67.0 in | Wt 229.0 lb

## 2017-07-24 DIAGNOSIS — R600 Localized edema: Secondary | ICD-10-CM

## 2017-07-24 DIAGNOSIS — Z720 Tobacco use: Secondary | ICD-10-CM

## 2017-07-24 DIAGNOSIS — E782 Mixed hyperlipidemia: Secondary | ICD-10-CM

## 2017-07-24 DIAGNOSIS — Z85118 Personal history of other malignant neoplasm of bronchus and lung: Secondary | ICD-10-CM | POA: Diagnosis not present

## 2017-07-24 DIAGNOSIS — I10 Essential (primary) hypertension: Secondary | ICD-10-CM

## 2017-07-24 NOTE — Patient Instructions (Signed)
Heart-Healthy Eating Plan Many factors influence your heart health, including eating and exercise habits. Heart (coronary) risk increases with abnormal blood fat (lipid) levels. Heart-healthy meal planning includes limiting unhealthy fats, increasing healthy fats, and making other small dietary changes. This includes maintaining a healthy body weight to help keep lipid levels within a normal range.  What types of fat should I choose?  Choose healthy fats more often. Choose monounsaturated and polyunsaturated fats, such as olive oil and canola oil, flaxseeds, walnuts, almonds, and seeds.  Eat more omega-3 fats. Good choices include salmon, mackerel, sardines, tuna, flaxseed oil, and ground flaxseeds. Aim to eat fish at least two times each week.  Limit saturated fats. Saturated fats are primarily found in animal products, such as meats, butter, and cream. Plant sources of saturated fats include palm oil, palm kernel oil, and coconut oil.  Avoid foods with partially hydrogenated oils in them. These contain trans fats. Examples of foods that contain trans fats are stick margarine, some tub margarines, cookies, crackers, and other baked goods. What general guidelines do I need to follow?  Check food labels carefully to identify foods with trans fats or high amounts of saturated fat.  Fill one half of your plate with vegetables and green salads. Eat 4-5 servings of vegetables per day. A serving of vegetables equals 1 cup of raw leafy vegetables,  cup of raw or cooked cut-up vegetables, or  cup of vegetable juice.  Fill one fourth of your plate with whole grains. Look for the word "whole" as the first word in the ingredient list.  Fill one fourth of your plate with lean protein foods.  Eat 4-5 servings of fruit per day. A serving of fruit equals one medium whole fruit,  cup of dried fruit,  cup of fresh, frozen, or canned fruit, or  cup of 100% fruit juice.  Eat more foods that contain  soluble fiber. Examples of foods that contain this type of fiber are apples, broccoli, carrots, beans, peas, and barley. Aim to get 20-30 g of fiber per day.  Eat more home-cooked food and less restaurant, buffet, and fast food.  Limit or avoid alcohol.  Limit foods that are high in starch and sugar.  Avoid fried foods.  Cook foods by using methods other than frying. Baking, boiling, grilling, and broiling are all great options. Other fat-reducing suggestions include: ? Removing the skin from poultry. ? Removing all visible fats from meats. ? Skimming the fat off of stews, soups, and gravies before serving them. ? Steaming vegetables in water or broth.  Lose weight if you are overweight. Losing just 5-10% of your initial body weight can help your overall health and prevent diseases such as diabetes and heart disease.  Increase your consumption of nuts, legumes, and seeds to 4-5 servings per week. One serving of dried beans or legumes equals  cup after being cooked, one serving of nuts equals 1 ounces, and one serving of seeds equals  ounce or 1 tablespoon.  You may need to monitor your salt (sodium) intake, especially if you have high blood pressure. Talk with your health care provider or dietitian to get more information about reducing sodium. What foods can I eat? Grains  Breads, including [place], white, pita, wheat, raisin, rye, oatmeal, and [place]. Tortillas that are neither fried nor made with lard or trans fat. Low-fat rolls, including hotdog and hamburger buns and English muffins. Biscuits. Muffins. Waffles. Pancakes. Light popcorn. Whole-grain cereals. Flatbread. Melba toast. Pretzels.  Breadsticks. Rusks. Low-fat snacks and crackers, including oyster, saltine, matzo, graham, animal, and rye. Rice and pasta, including brown rice and those that are made with whole wheat. Vegetables All vegetables. Fruits All fruits, but limit coconut. Meats and Other Protein Sources Lean,  well-trimmed beef, veal, pork, and lamb. Chicken and [place] without skin. All fish and shellfish. Wild duck, rabbit, pheasant, and venison. Egg whites or low-cholesterol egg substitutes. Dried beans, peas, lentils, and tofu.Seeds and most nuts. Dairy Low-fat or nonfat cheeses, including ricotta, string, and mozzarella. Skim or 1% milk that is liquid, powdered, or evaporated. Buttermilk that is made with low-fat milk. Nonfat or low-fat yogurt. Beverages Mineral water. Diet carbonated beverages. Sweets and Desserts Sherbets and fruit ices. Honey, jam, marmalade, jelly, and syrups. Meringues and gelatins. Pure sugar candy, such as hard candy, jelly beans, gumdrops, mints, marshmallows, and small amounts of dark chocolate. [employer] Eat all sweets and desserts in moderation. Fats and Oils Nonhydrogenated (trans-free) margarines. Vegetable oils, including soybean, sesame, sunflower, olive, peanut, safflower, corn, canola, and cottonseed. Salad dressings or mayonnaise that are made with a vegetable oil. Limit added fats and oils that you use for cooking, baking, salads, and as spreads. Other Cocoa powder. Coffee and tea. All seasonings and condiments. The items listed above may not be a complete list of recommended foods or beverages. Contact your dietitian for more options. What foods are not recommended? Grains Breads that are made with saturated or trans fats, oils, or whole milk. Croissants. Butter rolls. Cheese breads. Sweet rolls. Donuts. Buttered popcorn. Chow mein noodles. High-fat crackers, such as cheese or butter crackers. Meats and Other Protein Sources Fatty meats, such as hotdogs, short ribs, sausage, spareribs, bacon, ribeye roast or steak, and mutton. High-fat deli meats, such as salami and bologna. Caviar. Domestic duck and goose. Organ meats, such as kidney, liver, sweetbreads, brains, gizzard, chitterlings, and heart. Dairy Cream, sour cream, cream cheese, and creamed cottage  cheese. Whole milk cheeses, including blue (bleu), Monterey Jack, [place], [place], [place], [place], Swiss, [place], [place], and [place]. Whole or 2% milk that is liquid, evaporated, or condensed. Whole buttermilk. Cream sauce or high-fat cheese sauce. Yogurt that is made from whole milk. Beverages Regular sodas and drinks with added sugar. Sweets and Desserts Frosting. Pudding. Cookies. Cakes other than angel food cake. Candy that has milk chocolate or white chocolate, hydrogenated fat, butter, coconut, or unknown ingredients. Buttered syrups. Full-fat ice cream or ice cream drinks. Fats and Oils Gravy that has suet, meat fat, or shortening. Cocoa butter, hydrogenated oils, palm oil, coconut oil, palm kernel oil. These can often be found in baked products, candy, fried foods, nondairy creamers, and whipped toppings. Solid fats and shortenings, including bacon fat, salt pork, lard, and butter. Nondairy cream substitutes, such as coffee creamers and sour cream substitutes. Salad dressings that are made of unknown oils, cheese, or sour cream. The items listed above may not be a complete list of foods and beverages to avoid. Contact your dietitian for more information. This information is not intended to replace advice given to you by your health care provider. Make sure you discuss any questions you have with your health care provider. Document Released: 04/10/2008 Document Revised: 01/20/2016 Document Reviewed: 12/24/2013 Elsevier Interactive Patient Education  2018 [employer]. Steps to Quit Smoking Smoking tobacco can be bad for your health. It can also affect almost every organ in your body. Smoking puts you and people around you at risk for many serious long-lasting (chronic) diseases. Quitting smoking  is hard, but it is one of the best things that you can do for your health. It is never too late to quit. What are the benefits of quitting smoking? When you quit smoking, you lower your risk for  getting serious diseases and conditions. They can include:  Lung cancer or lung disease.  Heart disease.  Stroke.  Heart attack.  Not being able to have children (infertility).  Weak bones (osteoporosis) and broken bones (fractures).  If you have coughing, wheezing, and shortness of breath, those symptoms may get better when you quit. You may also get sick less often. If you are pregnant, quitting smoking can help to lower your chances of having a baby of low birth weight. What can I do to help me quit smoking? Talk with your doctor about what can help you quit smoking. Some things you can do (strategies) include:  Quitting smoking totally, instead of slowly cutting back how much you smoke over a period of time.  Going to in-person counseling. You are more likely to quit if you go to many counseling sessions.  Using resources and support systems, such as: ? Database administrator with a Social worker. ? Phone quitlines. ? Careers information officer. ? Support groups or group counseling. ? Text messaging programs. ? Mobile phone apps or applications.  Taking medicines. Some of these medicines may have nicotine in them. If you are pregnant or breastfeeding, do not take any medicines to quit smoking unless your doctor says it is okay. Talk with your doctor about counseling or other things that can help you.  Talk with your doctor about using more than one strategy at the same time, such as taking medicines while you are also going to in-person counseling. This can help make quitting easier. What things can I do to make it easier to quit? Quitting smoking might feel very hard at first, but there is a lot that you can do to make it easier. Take these steps:  Talk to your family and friends. Ask them to support and encourage you.  Call phone quitlines, reach out to support groups, or work with a Social worker.  Ask people who smoke to not smoke around you.  Avoid places that make you want  (trigger) to smoke, such as: ? Bars. ? Parties. ? Smoke-break areas at work.  Spend time with people who do not smoke.  Lower the stress in your life. Stress can make you want to smoke. Try these things to help your stress: ? Getting regular exercise. ? Deep-breathing exercises. ? Yoga. ? Meditating. ? Doing a body scan. To do this, close your eyes, focus on one area of your body at a time from head to toe, and notice which parts of your body are tense. Try to relax the muscles in those areas.  Download or buy apps on your mobile phone or tablet that can help you stick to your quit plan. There are many free apps, such as QuitGuide from the [employer] Office manager for Disease Control and Prevention). You can find more support from smokefree.gov and other websites.  This information is not intended to replace advice given to you by your health care provider. Make sure you discuss any questions you have with your health care provider. Document Released: 04/28/2009 Document Revised: 02/28/2016 Document Reviewed: 11/16/2014 Elsevier Interactive Patient Education  2018 [employer].
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
## 2017-07-29 DIAGNOSIS — S83412A Sprain of medial collateral ligament of left knee, initial encounter: Secondary | ICD-10-CM | POA: Diagnosis not present

## 2017-07-29 DIAGNOSIS — M25562 Pain in left knee: Secondary | ICD-10-CM | POA: Diagnosis not present

## 2017-08-12 DIAGNOSIS — S83412D Sprain of medial collateral ligament of left knee, subsequent encounter: Secondary | ICD-10-CM | POA: Diagnosis not present

## 2017-09-03 DIAGNOSIS — H01024 Squamous blepharitis left upper eyelid: Secondary | ICD-10-CM | POA: Diagnosis not present

## 2017-09-03 DIAGNOSIS — H01021 Squamous blepharitis right upper eyelid: Secondary | ICD-10-CM | POA: Diagnosis not present

## 2017-09-03 DIAGNOSIS — H2513 Age-related nuclear cataract, bilateral: Secondary | ICD-10-CM | POA: Diagnosis not present

## 2017-09-03 DIAGNOSIS — H01022 Squamous blepharitis right lower eyelid: Secondary | ICD-10-CM | POA: Diagnosis not present

## 2017-09-03 DIAGNOSIS — H01025 Squamous blepharitis left lower eyelid: Secondary | ICD-10-CM | POA: Diagnosis not present

## 2017-09-09 ENCOUNTER — Other Ambulatory Visit: Payer: Self-pay | Admitting: Nurse Practitioner

## 2017-10-03 ENCOUNTER — Other Ambulatory Visit: Payer: Self-pay | Admitting: Nurse Practitioner

## 2017-10-03 ENCOUNTER — Other Ambulatory Visit: Payer: Self-pay | Admitting: Cardiology

## 2017-10-04 ENCOUNTER — Other Ambulatory Visit: Payer: Self-pay | Admitting: Nurse Practitioner

## 2017-11-14 ENCOUNTER — Other Ambulatory Visit: Payer: PPO

## 2017-11-14 DIAGNOSIS — I10 Essential (primary) hypertension: Secondary | ICD-10-CM | POA: Diagnosis not present

## 2017-11-14 DIAGNOSIS — E782 Mixed hyperlipidemia: Secondary | ICD-10-CM | POA: Diagnosis not present

## 2017-11-14 DIAGNOSIS — R739 Hyperglycemia, unspecified: Secondary | ICD-10-CM | POA: Diagnosis not present

## 2017-11-18 ENCOUNTER — Encounter: Payer: Self-pay | Admitting: Nurse Practitioner

## 2017-11-18 ENCOUNTER — Ambulatory Visit (INDEPENDENT_AMBULATORY_CARE_PROVIDER_SITE_OTHER): Payer: PPO | Admitting: Nurse Practitioner

## 2017-11-18 VITALS — BP 126/74 | HR 73 | Temp 98.7°F | Ht 67.0 in | Wt 226.6 lb

## 2017-11-18 DIAGNOSIS — E782 Mixed hyperlipidemia: Secondary | ICD-10-CM

## 2017-11-18 DIAGNOSIS — I10 Essential (primary) hypertension: Secondary | ICD-10-CM | POA: Diagnosis not present

## 2017-11-18 DIAGNOSIS — R972 Elevated prostate specific antigen [PSA]: Secondary | ICD-10-CM | POA: Diagnosis not present

## 2017-11-18 DIAGNOSIS — Z720 Tobacco use: Secondary | ICD-10-CM

## 2017-11-18 DIAGNOSIS — Z85118 Personal history of other malignant neoplasm of bronchus and lung: Secondary | ICD-10-CM | POA: Diagnosis not present
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
## 2017-11-18 NOTE — Patient Instructions (Signed)
Follow up in 5 months with fasting lab work prior to visit Work on diet in the meantime   Food Choices to [place] Triglycerides Triglycerides are a type of fat in your blood. High levels of triglycerides can increase the risk of heart disease and stroke. If your triglyceride levels are high, the foods you eat and your eating habits are very important. Choosing the right foods can help lower your triglycerides. What general guidelines do I need to follow?  Lose weight if you are overweight.  Limit or avoid alcohol.  Fill one half of your plate with vegetables and green salads.  Limit fruit to two servings a day. Choose fruit instead of juice.  Make one fourth of your plate whole grains. Look for the word "whole" as the first word in the ingredient list.  Fill one fourth of your plate with lean protein foods.  Enjoy fatty fish (such as salmon, mackerel, sardines, and tuna) three times a week.  Choose healthy fats.  Limit foods high in starch and sugar.  Eat more home-cooked food and less restaurant, buffet, and fast food.  Limit fried foods.  Cook foods using methods other than frying.  Limit saturated fats.  Check ingredient lists to avoid foods with partially hydrogenated oils (trans fats) in them. What foods can I eat? Grains Whole grains, such as whole wheat or whole grain breads, crackers, cereals, and pasta. Unsweetened oatmeal, bulgur, barley, quinoa, or brown rice. Corn or whole wheat flour tortillas. Vegetables Fresh or frozen vegetables (raw, steamed, roasted, or grilled). Green salads. Fruits All fresh, canned (in natural juice), or frozen fruits. Meat and Other Protein Products Ground beef (85% or leaner), grass-fed beef, or beef trimmed of fat. Skinless chicken or [place]. Ground chicken or [place]. Pork trimmed of fat. All fish and seafood. Eggs. Dried beans, peas, or lentils. Unsalted nuts or seeds. Unsalted canned or dry beans. Dairy Low-fat dairy products,  such as skim or 1% milk, 2% or reduced-fat cheeses, low-fat ricotta or cottage cheese, or plain low-fat yogurt. Fats and Oils Tub margarines without trans fats. Light or reduced-fat mayonnaise and salad dressings. Avocado. Safflower, olive, or canola oils. Natural peanut or almond butter. The items listed above may not be a complete list of recommended foods or beverages. Contact your dietitian for more options. What foods are not recommended? Grains White bread. White pasta. White rice. Cornbread. Bagels, pastries, and croissants. Crackers that contain trans fat. Vegetables White potatoes. Corn. Creamed or fried vegetables. Vegetables in a cheese sauce. Fruits Dried fruits. Canned fruit in light or heavy syrup. Fruit juice. Meat and Other Protein Products Fatty cuts of meat. Ribs, chicken wings, bacon, sausage, bologna, salami, chitterlings, fatback, hot dogs, bratwurst, and packaged luncheon meats. Dairy Whole or 2% milk, cream, half-and-half, and cream cheese. Whole-fat or sweetened yogurt. Full-fat cheeses. Nondairy creamers and whipped toppings. Processed cheese, cheese spreads, or cheese curds. Sweets and Desserts Corn syrup, sugars, honey, and molasses. Candy. Jam and jelly. Syrup. Sweetened cereals. Cookies, pies, cakes, donuts, muffins, and ice cream. Fats and Oils Butter, stick margarine, lard, shortening, ghee, or bacon fat. Coconut, palm kernel, or palm oils. Beverages Alcohol. Sweetened drinks (such as sodas, lemonade, and fruit drinks or punches). The items listed above may not be a complete list of foods and beverages to avoid. Contact your dietitian for more information. This information is not intended to replace advice given to you by your health care provider. Make sure you discuss any questions you have with your  health care provider. Document Released: 04/19/2004 Document Revised: 12/08/2015 Document Reviewed: 05/06/2013 Elsevier Interactive Patient Education  2017  [employer].

## 2017-11-19 LAB — COMPLETE METABOLIC PANEL WITH GFR
AG RATIO: 1.7 (calc) (ref 1.0–2.5)
ALBUMIN MSPROF: 4.3 g/dL (ref 3.6–5.1)
ALKALINE PHOSPHATASE (APISO): 80 U/L (ref 40–115)
ALT: 14 U/L (ref 9–46)
AST: 20 U/L (ref 10–35)
BUN: 19 mg/dL (ref 7–25)
CALCIUM: 9.2 mg/dL (ref 8.6–10.3)
CO2: 27 mmol/L (ref 20–32)
CREATININE: 0.94 mg/dL (ref 0.70–1.18)
Chloride: 104 mmol/L (ref 98–110)
GFR, EST NON AFRICAN AMERICAN: 80 mL/min/{1.73_m2} (ref >=60)
GFR, Est African American: 92 mL/min/{1.73_m2} (ref >=60)
GLOBULIN: 2.5 g/dL (ref 1.9–3.7)
Glucose, Bld: 111 mg/dL — ABNORMAL HIGH (ref 65–99)
POTASSIUM: 4.5 mmol/L (ref 3.5–5.3)
SODIUM: 139 mmol/L (ref 135–146)
Total Bilirubin: 0.6 mg/dL (ref 0.2–1.2)
Total Protein: 6.8 g/dL (ref 6.1–8.1)

## 2017-11-19 LAB — CBC WITH DIFFERENTIAL/PLATELET
BASOS ABS: 31 {cells}/uL (ref 0–200)
Basophils Relative: 0.4 %
EOS ABS: 78 {cells}/uL (ref 15–500)
Eosinophils Relative: 1 %
HEMATOCRIT: 50.4 % — AB (ref 38.5–50.0)
HEMOGLOBIN: 17 g/dL (ref 13.2–17.1)
LYMPHS ABS: 1349 {cells}/uL (ref 850–3900)
MCH: 31 pg (ref 27.0–33.0)
MCHC: 33.7 g/dL (ref 32.0–36.0)
MCV: 91.8 fL (ref 80.0–100.0)
MPV: 10.8 fL (ref 7.5–12.5)
Monocytes Relative: 8.3 %
NEUTROS ABS: 5694 {cells}/uL (ref 1500–7800)
NEUTROS PCT: 73 %
Platelets: 236 10*3/uL (ref 140–400)
RBC: 5.49 10*6/uL (ref 4.20–5.80)
RDW: 13.2 % (ref 11.0–15.0)
Total Lymphocyte: 17.3 %
WBC: 7.8 10*3/uL (ref 3.8–10.8)
WBCMIX: 647 {cells}/uL (ref 200–950)

## 2017-11-19 LAB — TEST AUTHORIZATION

## 2017-11-19 LAB — HEMOGLOBIN A1C W/OUT EAG: Hgb A1c MFr Bld: 5.3 % of total Hgb (ref <5.7)

## 2017-11-19 LAB — LIPID PANEL
CHOL/HDL RATIO: 4.4 (calc) (ref <5.0)
Cholesterol: 153 mg/dL (ref <200)
HDL: 35 mg/dL — AB (ref >40)
LDL Cholesterol (Calc): 90 mg/dL (calc)
NON-HDL CHOLESTEROL (CALC): 118 mg/dL (ref <130)
Triglycerides: 180 mg/dL — ABNORMAL HIGH (ref <150)

## 2017-11-26 ENCOUNTER — Other Ambulatory Visit: Payer: Self-pay | Admitting: *Deleted

## 2017-11-26 DIAGNOSIS — Z85118 Personal history of other malignant neoplasm of bronchus and lung: Secondary | ICD-10-CM

## 2017-12-03 ENCOUNTER — Other Ambulatory Visit: Payer: Self-pay | Admitting: Nurse Practitioner

## 2017-12-03 DIAGNOSIS — L0291 Cutaneous abscess, unspecified: Secondary | ICD-10-CM | POA: Diagnosis not present

## 2017-12-05 ENCOUNTER — Telehealth: Payer: Self-pay | Admitting: Nurse Practitioner

## 2017-12-05 NOTE — Telephone Encounter (Signed)
Left msg asking pt to confirm this AWV-S w/ nurse. Last AWV 11/23/16. VDM (DD)

## 2017-12-12 DIAGNOSIS — L0291 Cutaneous abscess, unspecified: Secondary | ICD-10-CM | POA: Diagnosis not present

## 2017-12-13 DIAGNOSIS — L02212 Cutaneous abscess of back [any part, except buttock]: Secondary | ICD-10-CM | POA: Diagnosis not present

## 2017-12-18 ENCOUNTER — Ambulatory Visit (INDEPENDENT_AMBULATORY_CARE_PROVIDER_SITE_OTHER): Payer: PPO

## 2017-12-18 VITALS — BP 140/66 | HR 74 | Temp 98.1°F | Ht 67.0 in | Wt 220.0 lb

## 2017-12-18 DIAGNOSIS — Z0000 Encounter for general adult medical examination without abnormal findings: Secondary | ICD-10-CM | POA: Diagnosis not present

## 2017-12-18 NOTE — Patient Instructions (Addendum)
[name] , Thank you for taking time to come for your Medicare Wellness Visit. I appreciate your ongoing commitment to your health goals. Please review the following plan we discussed and let me know if I can assist you in the future.   Screening recommendations/referrals: Colonoscopy up to date, due 10/18/2020 Recommended yearly ophthalmology/optometry visit for glaucoma screening and checkup Recommended yearly dental visit for hygiene and checkup  Vaccinations: Influenza vaccine up to date, due 2019 fall season Pneumococcal vaccine up to date, completed Tdap vaccine up to date, due 07/17/2023 Shingles vaccine up to date. Please call [facility] for the 2 dates of your Shingrix shot and then call us so we can update your chart    Advanced directives: Please give [place] a copy of your living will and health care power of attorney  Conditions/risks identified: none  Next appointment: Sherrie Mustache, NP 04/21/2018 @ 9:45am            Tyson Dense, RN 12/22/2018 @ 8:30am  Preventive Care 65 Years and Older, Male Preventive care refers to lifestyle choices and visits with your health care provider that can promote health and wellness. What does preventive care include?  A yearly physical exam. This is also called an annual well check.  Dental exams once or twice a year.  Routine eye exams. Ask your health care provider how often you should have your eyes checked.  Personal lifestyle choices, including:  Daily care of your teeth and gums.  Regular physical activity.  Eating a healthy diet.  Avoiding tobacco and drug use.  Limiting alcohol use.  Practicing safe sex.  Taking low doses of aspirin every day.  Taking vitamin and mineral supplements as recommended by your health care provider. What happens during an annual well check? The services and screenings done by your health care provider during your annual well check will depend on your age, overall health, lifestyle risk  factors, and family history of disease. Counseling  Your health care provider may ask you questions about your:  Alcohol use.  Tobacco use.  Drug use.  Emotional well-being.  Home and relationship well-being.  Sexual activity.  Eating habits.  History of falls.  Memory and ability to understand (cognition).  Work and work Statistician. Screening  You may have the following tests or measurements:  Height, weight, and BMI.  Blood pressure.  Lipid and cholesterol levels. These may be checked every 5 years, or more frequently if you are over 16 years old.  Skin check.  Lung cancer screening. You may have this screening every year starting at age 49 if you have a 30-pack-year history of smoking and currently smoke or have quit within the past 15 years.  Fecal occult blood test (FOBT) of the stool. You may have this test every year starting at age 19.  Flexible sigmoidoscopy or colonoscopy. You may have a sigmoidoscopy every 5 years or a colonoscopy every 10 years starting at age 80.  Prostate cancer screening. Recommendations will vary depending on your family history and other risks.  Hepatitis C blood test.  Hepatitis B blood test.  Sexually transmitted disease (STD) testing.  Diabetes screening. This is done by checking your blood sugar (glucose) after you have not eaten for a while (fasting). You may have this done every 1-3 years.  Abdominal aortic aneurysm (AAA) screening. You may need this if you are a current or former smoker.  Osteoporosis. You may be screened starting at age 29 if you are at high risk.  Talk with your health care provider about your test results, treatment options, and if necessary, the need for more tests. Vaccines  Your health care provider may recommend certain vaccines, such as:  Influenza vaccine. This is recommended every year.  Tetanus, diphtheria, and acellular pertussis (Tdap, Td) vaccine. You may need a Td booster every 10  years.  Zoster vaccine. You may need this after age 93.  Pneumococcal 13-valent conjugate (PCV13) vaccine. One dose is recommended after age 62.  Pneumococcal polysaccharide (PPSV23) vaccine. One dose is recommended after age 21. Talk to your health care provider about which screenings and vaccines you need and how often you need them. This information is not intended to replace advice given to you by your health care provider. Make sure you discuss any questions you have with your health care provider. Document Released: 07/29/2015 Document Revised: 03/21/2016 Document Reviewed: 05/03/2015 Elsevier Interactive Patient Education  2017 [place] Prevention in the Home Falls can cause injuries. They can happen to people of all ages. There are many things you can do to make your home safe and to help prevent falls. What can I do on the outside of my home?  Regularly fix the edges of walkways and driveways and fix any cracks.  Remove anything that might make you trip as you walk through a door, such as a raised step or threshold.  Trim any bushes or trees on the path to your home.  Use bright outdoor lighting.  Clear any walking paths of anything that might make someone trip, such as rocks or tools.  Regularly check to see if handrails are loose or broken. Make sure that both sides of any steps have handrails.  Any raised decks and porches should have guardrails on the edges.  Have any leaves, snow, or ice cleared regularly.  Use sand or salt on walking paths during winter.  Clean up any spills in your garage right away. This includes oil or grease spills. What can I do in the bathroom?  Use night lights.  Install grab bars by the toilet and in the tub and shower. Do not use towel bars as grab bars.  Use non-skid mats or decals in the tub or shower.  If you need to sit down in the shower, use a plastic, non-slip stool.  Keep the floor dry. Clean up any water that  spills on the floor as soon as it happens.  Remove soap buildup in the tub or shower regularly.  Attach bath mats securely with double-sided non-slip rug tape.  Do not have throw rugs and other things on the floor that can make you trip. What can I do in the bedroom?  Use night lights.  Make sure that you have a light by your bed that is easy to reach.  Do not use any sheets or blankets that are too big for your bed. They should not hang down onto the floor.  Have a firm chair that has side arms. You can use this for support while you get dressed.  Do not have throw rugs and other things on the floor that can make you trip. What can I do in the kitchen?  Clean up any spills right away.  Avoid walking on wet floors.  Keep items that you use a lot in easy-to-reach places.  If you need to reach something above you, use a strong step stool that has a grab bar.  Keep electrical cords out of the way.  Do not use floor polish or wax that makes floors slippery. If you must use wax, use non-skid floor wax.  Do not have throw rugs and other things on the floor that can make you trip. What can I do with my stairs?  Do not leave any items on the stairs.  Make sure that there are handrails on both sides of the stairs and use them. Fix handrails that are broken or loose. Make sure that handrails are as long as the stairways.  Check any carpeting to make sure that it is firmly attached to the stairs. Fix any carpet that is loose or worn.  Avoid having throw rugs at the top or bottom of the stairs. If you do have throw rugs, attach them to the floor with carpet tape.  Make sure that you have a light switch at the top of the stairs and the bottom of the stairs. If you do not have them, ask someone to add them for you. What else can I do to help prevent falls?  Wear shoes that:  Do not have high heels.  Have rubber bottoms.  Are comfortable and fit you well.  Are closed at the  toe. Do not wear sandals.  If you use a stepladder:  Make sure that it is fully opened. Do not climb a closed stepladder.  Make sure that both sides of the stepladder are locked into place.  Ask someone to hold it for you, if possible.  Clearly mark and make sure that you can see:  Any grab bars or handrails.  First and last steps.  Where the edge of each step is.  Use tools that help you move around (mobility aids) if they are needed. These include:  Canes.  Walkers.  Scooters.  Crutches.  Turn on the lights when you go into a dark area. Replace any light bulbs as soon as they burn out.  Set up your furniture so you have a clear path. Avoid moving your furniture around.  If any of your floors are uneven, fix them.  If there are any pets around you, be aware of where they are.  Review your medicines with your doctor. Some medicines can make you feel dizzy. This can increase your chance of falling. Ask your doctor what other things that you can do to help prevent falls. This information is not intended to replace advice given to you by your health care provider. Make sure you discuss any questions you have with your health care provider. Document Released: 04/28/2009 Document Revised: 12/08/2015 Document Reviewed: 08/06/2014 Elsevier Interactive Patient Education  2017 [employer].

## 2017-12-18 NOTE — Progress Notes (Addendum)
Subjective:   [name] is a 77 y.o. male who presents for Medicare Annual/Subsequent preventive examination.  Last AWV-11/23/2016    Objective:    Vitals: BP 140/66 (BP Location: Left Arm, Patient Position: Sitting)   Pulse 74   Temp 98.1 F (36.7 C) (Oral)   Ht 5\' 7"  (1.702 m)   Wt 220 lb (99.8 kg)   SpO2 95%   BMI 34.46 kg/m   Body mass index is 34.46 kg/m.  Advanced Directives 12/18/2017 11/18/2017 07/24/2017 06/10/2017 04/22/2017 11/23/2016 11/19/2016  Does Patient Have a Medical Advance Directive? No No No No No No No  Would patient like information on creating a medical advance directive? Yes (MAU/Ambulatory/Procedural Areas - Information given) No - Patient declined - No - Patient declined - Yes (MAU/Ambulatory/Procedural Areas - Information given) -  Pre-existing out of facility DNR order (yellow form or pink MOST form) - - - - - - -    Tobacco Social History   Tobacco Use  Smoking Status Current Every Day Smoker  . Packs/day: 0.50  . Years: 60.00  . Pack years: 30.00  . Types: Cigarettes, Cigars  . Start date: 07/16/1958  Smokeless Tobacco Former Systems developer  . Types: Chew     Ready to quit: Not Answered Counseling given: Not Answered   Clinical Intake:  Pre-visit preparation completed: No  Pain : No/denies pain     Nutritional Risks: None Diabetes: No  How often do you need to have someone help you when you read instructions, pamphlets, or other written materials from your doctor or pharmacy?: 1 - Never What is the last grade level you completed in school?: [employer]  Interpreter Needed?: No  Information entered by :: Tyson Dense, RN  Past Medical History:  Diagnosis Date  . A-fib ([place])   . Acute appendicitis with peritoneal abscess 01/28/2013  . Arthritis   . Bacteremia September 2015  . Elevated PSA   . Essential hypertension 04/16/2014  . Heart murmur    childhood  . Ileus, postoperative ([place]) 01/28/2013  . Lung cancer ([place])   . Obesity,  morbid ([place]) 01/28/2013  . Shortness of breath   . Tobacco use disorder 01/28/2013   Past Surgical History:  Procedure Laterality Date  . APPENDECTOMY    . [place], 2003   Dr.Apleton   . CARPAL TUNNEL RELEASE    . COLONOSCOPY     x 3  . EYE SURGERY Left    muscle of the eye   . KNEE SURGERY Bilateral   . LAPAROSCOPIC APPENDECTOMY N/A 01/25/2013   Procedure: APPENDECTOMY LAPAROSCOPIC;  Surgeon: Ralene Ok, MD;  Location: [facility];  Service: General;  Laterality: N/A;  . LYMPH NODE DISSECTION Left 04/26/2015   Procedure: LYMPH NODE DISSECTION;  Surgeon: Grace Isaac, MD;  Location: [facility];  Service: Thoracic;  Laterality: Left;  . SHOULDER SURGERY Right 2014   Dr.Whitfield rotator cuff surgery  . TEE WITHOUT CARDIOVERSION N/A 04/07/2014   Procedure: TRANSESOPHAGEAL ECHOCARDIOGRAM (TEE);  Surgeon: Candee Furbish, MD;  Location[facility] ENDOSCOPY;  Service: Cardiovascular;  Laterality: N/A;  . VIDEO ASSISTED THORACOSCOPY (VATS)/WEDGE RESECTION Left 04/26/2015   Procedure: LEFT VIDEO ASSISTED THORACOSCOPY (VATS) WITH LEFT LOWER LOBECTOMY;  Surgeon: Grace Isaac, MD;  Location: [facility];  Service: Thoracic;  Laterality: Left;  Marland Kitchen VIDEO BRONCHOSCOPY N/A 04/26/2015   Procedure: VIDEO BRONCHOSCOPY;  Surgeon: Grace Isaac, MD;  Location: [facility];  Service: Thoracic;  Laterality: N/A;   Family History  Problem Relation Age of Onset  . Irritable bowel syndrome Mother   . Cancer Neg Hx   . Lung disease Neg Hx   . Colon cancer Neg Hx   . Colon polyps Neg Hx    Social History   Socioeconomic History  . Marital status: Divorced    Spouse name: Not on file  . Number of children: 0  . Years of education: Not on file  . Highest education level: Not on file  Occupational History  . Occupation: Retired  Scientific laboratory technician  . Financial resource strain: Not hard at all  . Food insecurity:    Worry: Never true    Inability: Never true  . Transportation needs:    Medical: No     Non-medical: No  Tobacco Use  . Smoking status: Current Every Day Smoker    Packs/day: 0.50    Years: 60.00    Pack years: 30.00    Types: Cigarettes, Cigars    Start date: 07/16/1958  . Smokeless tobacco: Former Systems developer    Types: Chew  Substance and Sexual Activity  . Alcohol use: No    Alcohol/week: 0.0 oz  . Drug use: No  . Sexual activity: Not Currently  Lifestyle  . Physical activity:    Days per week: 0 days    Minutes per session: 0 min  . Stress: Only a little  Relationships  . Social connections:    Talks on phone: More than three times a week    Gets together: More than three times a week    Attends religious service: More than 4 times per year    Active member of club or organization: No    Attends meetings of clubs or organizations: Never    Relationship status: Divorced  Other Topics Concern  . Not on file  Social History Narrative   No diet   Yes, eats/drinks things with caffeine    Divorced, married 1979   Lives in a house, one stories , one person, no pets   Current/past profession- [employer]   Patient exercises, golf 3 days weekly        Originally from [place]. Previously lived in [facility] for [place] years and moved back to [place] in 1968. No international travel. He has traveled through multiple states traveling to [place]. 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 Unsure if he was exposed to any asbestos. No mold exposure. No pets currently. Previously has owned dogs. Parakeet as a child. No hot tub exposure. Enjoys golfing.    Outpatient Encounter Medications as of 12/18/2017  Medication Sig  . hydrALAZINE (APRESOLINE) 25 MG tablet TAKE 1 TABLET BY MOUTH THREE TIMES DAILY FOR BLOOD PRESSURE  . losartan (COZAAR) 50 MG tablet TAKE 1 TABLET BY MOUTH EVERY DAY FOR BLOOD PRESSURE  . metoprolol tartrate (LOPRESSOR) 25 MG tablet TAKE 1/2 TABLET(12.5 MG) BY MOUTH TWICE DAILY   No facility-administered  encounter medications on file as of 12/18/2017.     Activities of Daily Living In your present state of health, do you have any difficulty performing the following activities: 12/18/2017  Hearing? N  Vision? N  Difficulty concentrating or making decisions? N  Walking or climbing stairs? Y  Dressing or bathing? N  Doing errands, shopping? N  Preparing Food and eating ? N  Using the Toilet? N  In the past six months, have you accidently leaked urine? N  Do you have problems with  loss of bowel control? N  Managing your Medications? N  Managing your Finances? N  Housekeeping or managing your Housekeeping? N  Some recent data might be hidden    Patient Care Team: [name], NP as PCP - General (Nurse Practitioner) Druscilla Brownie, MD as Consulting Physician (Dermatology)   Assessment:   This is a routine wellness examination for [facility].  Exercise Activities and Dietary recommendations Current Exercise Habits: The patient does not participate in regular exercise at present, Exercise limited by: None identified  Goals    . Quit smoking / using tobacco       Fall Risk Fall Risk  12/18/2017 11/18/2017 07/24/2017 06/10/2017 04/22/2017  Falls in the past year? Yes No Yes Yes No  Number falls in past yr: 1 - 1 1 -  Comment - - - Pt slipped on leaves in a parking lot.  -  Injury with Fall? Yes - No No -  Comment torn ACL - - - -   Is the patient's home free of loose throw rugs in walkways, pet beds, electrical cords, etc?   yes      Grab bars in the bathroom? no      Handrails on the stairs?   yes      Adequate lighting?   yes  Timed Get Up and Go Performed: 20 seconds  Depression Screen PHQ 2/9 Scores 12/18/2017 11/23/2016 08/20/2016 02/14/2016  PHQ - 2 Score 0 0 0 0    Cognitive Function MMSE - Mini Mental State Exam 11/23/2016 02/14/2016 07/29/2014  Orientation to time 5 5 5   Orientation to Place 5 5 5   Registration 3 3 3   Attention/ Calculation 4 5 5   Recall 2 3 3   Language-  name 2 objects 2 2 2   Language- repeat 1 1 1   Language- follow 3 step command 3 3 3   Language- read & follow direction 1 1 1   Write a sentence 1 0 1  Copy design 1 1 1   Total score 28 29 30         Immunization History  Administered Date(s) Administered  . Influenza, High Dose Seasonal PF 03/25/2017  . Influenza,inj,quad, With Preservative 03/31/2016  . Influenza-Unspecified 04/29/2014  . Pneumococcal Conjugate-13 02/14/2016  . Pneumococcal-Unspecified 04/29/2014    Qualifies for Shingles Vaccine? Up to date. Patient will call us with the dates of the shots  Screening Tests Health Maintenance  Topic Date Due  . COLON CANCER SCREENING ANNUAL FOBT  10/18/2021 (Originally 10/18/2016)  . INFLUENZA VACCINE  02/13/2018  . COLONOSCOPY  10/18/2020  . TETANUS/TDAP  07/17/2023  . PNA vac Low Risk Adult  Completed   Cancer Screenings: Lung: Low Dose CT Chest recommended if Age 18-80 years, 30 pack-year currently smoking OR have quit w/in 15years. Patient does qualify. Colorectal: up to date  Additional Screenings:  Hepatitis C Screening:declined      Patient went to wound clinic on Monday to have cyst bandage changed on his back. He states the nurse said he could take it off on yesterday without replacing it, but he was unable to reach it. I took the bandage off for him-the site was clean, pink, and dry with a little drainage on the bandage.  Plan:    I have personally reviewed and addressed the Medicare Annual Wellness questionnaire and have noted the following in the patient's chart:  A. Medical and social history B. Use of alcohol, tobacco or illicit drugs  C. Current medications and supplements D. Functional ability  and status E.  Nutritional status F.  Physical activity G. Advance directives H. List of other physicians I.  Hospitalizations, surgeries, and ER visits in previous 12 months [facility] to include hearing, vision, cognitive,  depression L. Referrals and appointments - none  In addition, I have reviewed and discussed with patient certain preventive protocols, quality metrics, and best practice recommendations. A written personalized care plan for preventive services as well as general preventive health recommendations were provided to patient.  See attached scanned questionnaire for additional information.   Signed,   Tyson Dense, RN Nurse Health Advisor  Patient Concerns: None

## 2017-12-24 DIAGNOSIS — M79642 Pain in left hand: Secondary | ICD-10-CM | POA: Diagnosis not present

## 2017-12-24 DIAGNOSIS — M65842 Other synovitis and tenosynovitis, left hand: Secondary | ICD-10-CM | POA: Diagnosis not present

## 2018-01-09 ENCOUNTER — Ambulatory Visit
Admission: RE | Admit: 2018-01-09 | Discharge: 2018-01-09 | Disposition: A | Discharge status: Home / Self Care | Payer: PPO | Source: Ambulatory Visit | Attending: Cardiothoracic Surgery | Admitting: Cardiothoracic Surgery

## 2018-01-09 ENCOUNTER — Encounter: Payer: PPO | Admitting: Cardiothoracic Surgery

## 2018-01-09 DIAGNOSIS — C3490 Malignant neoplasm of unspecified part of unspecified bronchus or lung: Secondary | ICD-10-CM | POA: Diagnosis not present

## 2018-01-09 DIAGNOSIS — Z85118 Personal history of other malignant neoplasm of bronchus and lung: Secondary | ICD-10-CM

## 2018-01-27 ENCOUNTER — Ambulatory Visit: Payer: PPO | Admitting: Cardiothoracic Surgery

## 2018-01-27 ENCOUNTER — Encounter: Payer: Self-pay | Admitting: Nurse Practitioner

## 2018-01-27 VITALS — BP 135/79 | HR 73 | Resp 20 | Ht 67.0 in | Wt 214.0 lb

## 2018-01-27 DIAGNOSIS — Z85118 Personal history of other malignant neoplasm of bronchus and lung: Secondary | ICD-10-CM | POA: Diagnosis not present

## 2018-01-27 DIAGNOSIS — Z902 Acquired absence of lung [part of]: Secondary | ICD-10-CM

## 2018-01-27 NOTE — Progress Notes (Signed)
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 PROCEDURE: Left lower lobectomy and lymph node dissection, video bronchoscopy, left video-assisted thoracoscopy, and placement of On-Q. 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  The lesion was a rapidly growing process from a negative she CT scan in January 2016.  Patient denies any specific symptoms of shortness of breath or hemoptysis.  He is back active including playing golf and riding his motorcycle a Honda gold wing.  Unfortunately he continues to smoke but is significantly cut back.  Patient  Past Medical History:  Diagnosis Date  . A-fib ([place])   . Acute appendicitis with peritoneal abscess 01/28/2013  . Arthritis   . Bacteremia September 2015  . Elevated PSA   . Essential hypertension 04/16/2014  . Heart murmur    childhood  . Ileus, postoperative ([place]) 01/28/2013  . Lung cancer ([place])   . Obesity, morbid ([place]) 01/28/2013  . Shortness of breath   . Tobacco use disorder 01/28/2013     Social History   Tobacco Use  Smoking Status Current Every Day Smoker  . Packs/day: 0.50  . Years: 60.00  . Pack years: 30.00  . Types: Cigarettes, Cigars  . Start date: 07/16/1958  Smokeless Tobacco  Former Systems developer  . Types: Chew    Social History   Substance and Sexual Activity  Alcohol Use No  . Alcohol/week: 0.0 oz     Allergies  Allergen Reactions  . Asa [Aspirin] Anaphylaxis and Swelling    Angioedema/Eyes and lip swelling  . Nsaids Hives  . Tolmetin Hives  . Naproxen Hives    Current Outpatient Medications  Medication Sig Dispense Refill  . hydrALAZINE (APRESOLINE) 25 MG tablet TAKE 1 TABLET BY MOUTH THREE TIMES DAILY FOR BLOOD PRESSURE 90 tablet 1  . losartan (COZAAR) 50 MG tablet TAKE 1 TABLET BY MOUTH EVERY DAY FOR BLOOD PRESSURE 90 tablet 1  . metoprolol tartrate (LOPRESSOR) 25 MG tablet TAKE 1/2 TABLET(12.5 MG) BY MOUTH TWICE DAILY 90 tablet 1   No current facility-administered medications for this visit.        Physical Exam: BP 135/79   Pulse 73   Resp 20   Ht 5\' 7"  (1.702 m)   Wt 214 lb (97.1 kg)   SpO2 95% Comment: RA  BMI 33.52 kg/m  General appearance: alert, cooperative and appears stated age Head: Normocephalic, without obvious abnormality, atraumatic Neck: no adenopathy, no carotid bruit, no JVD, supple, symmetrical, trachea midline and thyroid not enlarged, symmetric, no tenderness/mass/nodules Lymph nodes: Cervical, supraclavicular, and axillary nodes normal. Resp: clear to auscultation bilaterally Cardio: regular rate and rhythm, S1, S2 normal, no murmur, click, rub or gallop GI: soft,  non-tender; bowel sounds normal; no masses,  no organomegaly Extremities: extremities normal, atraumatic, no cyanosis or edema and Homans sign is negative, no sign of DVT Neurologic: Grossly normal  Diagnostic Studies & Laboratory data:     Recent Radiology Findings:   Ct Chest Wo Contrast  Result Date: 01/09/2018 CLINICAL DATA:  Status post left lower lobectomy 04/26/2015. wedge resection for non-small-cell lung cancer. Current smoker. Occasional shortness of breath. EXAM: CT CHEST WITHOUT CONTRAST TECHNIQUE: Multidetector CT imaging of the chest was  performed following the standard protocol without IV contrast. COMPARISON:  04/18/2017 FINDINGS: Cardiovascular: Aortic and branch vessel atherosclerosis. Tortuous thoracic aorta. Normal heart size, without pericardial effusion. Multivessel coronary artery atherosclerosis. Pulmonary artery enlargement, outflow tract 3.3 cm. Mediastinum/Nodes: No supraclavicular adenopathy. No mediastinal or definite hilar adenopathy, given limitations of unenhanced CT. Lungs/Pleura: Left-sided pleural thickening and trace inferior left pleural fluid are similar. Status post left lower lobectomy. Moderate bullous type emphysema. Secondary left lung base volume loss and scarring. A likely calcified lateral left lung base 2 mm nodule on image 87/8 is not significantly changed. Upper Abdomen: Left hepatic lobe cyst. Normal imaged portions of the spleen, stomach, pancreas, adrenal glands, left kidney. Abdominal aortic atherosclerosis. Musculoskeletal: Probable sebaceous cysts about the right posterior chest wall. The largest lesion has likely undergone interval draining, including on image 72/2. moderate thoracic spondylosis with accentuation of expected thoracic kyphosis. IMPRESSION: 1. Left lower lobectomy, without findings of recurrent or metastatic disease. 2. Aortic atherosclerosis (ICD10-I70.0), coronary artery atherosclerosis and emphysema (ICD10-J43.9). 3. Pulmonary artery enlargement suggests pulmonary arterial hypertension. Electronically Signed   By: Abigail Miyamoto M.D.   On: 01/09/2018 13:59  I have independently reviewed the above radiology studies  and reviewed the findings with the patient.   Ct Chest Wo Contrast  Result Date: 04/18/2017 CLINICAL DATA:  Restaging small cell lung cancer. EXAM: CT CHEST WITHOUT CONTRAST TECHNIQUE: Multidetector CT imaging of the chest was performed following the standard protocol without IV contrast. COMPARISON:  CT scan 09/13/2016 FINDINGS: Cardiovascular: The heart is normal in size.  No pericardial effusion. Mild tortuosity and calcification of the thoracic aorta is stable. Stable coronary artery calcifications. Mediastinum/Nodes: No mediastinal or hilar mass or lymphadenopathy. Small scattered lymph nodes are stable. The esophagus is grossly normal. Lungs/Pleura: Stable surgical changes from a left lower lobe lobectomy. No findings suspicious for recurrent tumor. No metastatic pulmonary nodules or acute pulmonary findings. Chronic left basilar pleural effusion with overlying scarring changes. Upper Abdomen: No significant upper abdominal findings. Stable hepatic cysts. No adrenal gland lesions. Musculoskeletal: Stable subcutaneous nodules involving the posterior chest wall. No supraclavicular or axillary adenopathy. No significant bony findings. IMPRESSION: 1. Stable CT appearance of the chest. Postsurgical changes noted from a left lower lobe lobectomy without CT findings for recurrent tumor, mediastinal/hilar adenopathy or metastatic pulmonary nodules. 2. No findings to suggest metastatic disease involving upper abdomen or bony structures. 3. Stable atherosclerotic calcifications involving the aorta and coronary arteries. Aortic Atherosclerosis (ICD10-I70.0) and Emphysema (ICD10-J43.9). Electronically Signed   By: Marijo Sanes M.D.   On: 04/18/2017 16:20   Ct Chest Wo Contrast  Result Date: 09/13/2016 CLINICAL DATA:  Status post left lower lobectomy 04/26/2015 for sarcomatoid lung carcinoma, presenting for restaging. Current smoker. EXAM: CT CHEST WITHOUT CONTRAST TECHNIQUE: Multidetector CT imaging of the chest was performed following the standard protocol without IV contrast. COMPARISON:  02/23/2016 chest CT. FINDINGS: Cardiovascular: Normal heart size. No significant pericardial fluid/thickening. Left anterior descending, left circumflex and right coronary atherosclerosis. Atherosclerotic nonaneurysmal thoracic  aorta. Stable top-normal caliber main pulmonary artery (3.1 cm diameter).  Mediastinum/Nodes: No discrete thyroid nodules. Unremarkable esophagus. No pathologically enlarged axillary, mediastinal or gross hilar lymph nodes, noting limited sensitivity for the detection of hilar adenopathy on this noncontrast study. Lungs/Pleura: No pneumothorax. No right pleural effusion. Status post left lower lobectomy. Tiny loculated dependent basilar left pleural effusion is decreased. Tiny peripheral right upper lobe 3 mm pulmonary nodule (series 4/ image 38) is stable back to 09/26/2015 and considered benign. Stable small parenchymal bands at the left lung base compatible with mild postinfectious scarring. Stable 2 mm calcified granuloma in the peripheral left upper lobe. No acute consolidative airspace disease, lung masses or new significant pulmonary nodules. Upper abdomen: Simple 1.9 cm left liver lobe cyst. Musculoskeletal: No aggressive appearing focal osseous lesions. Moderate thoracic spondylosis. No appreciable change and circumscribed superficial subcutaneous nodules in the bilateral back of variable density, largest 4.0 cm in the right back, most compatible with sebaceous cysts. IMPRESSION: 1. No evidence of local tumor recurrence in the left lung status post left lower lobectomy. 2. No evidence of metastatic disease in the chest. 3. Continued reduction in size of tiny loculated basilar left pleural effusion. 4. Aortic atherosclerosis.  Three-vessel coronary atherosclerosis. Electronically Signed   By: Ilona Sorrel M.D.   On: 09/13/2016 15:30   Ct Chest Wo Contrast  Result Date: 02/23/2016 CLINICAL DATA:  History of lung cancer status post left lower lobe wedge resection 04/26/2015. EXAM: CT CHEST WITHOUT CONTRAST TECHNIQUE: Multidetector CT imaging of the chest was performed following the standard protocol without IV contrast. COMPARISON:  Chest CT 09/26/2015 and PET CT 03/07/2015. FINDINGS: Cardiovascular: Atherosclerosis of aorta, great vessels and coronary arteries again noted. No  acute vascular findings are demonstrated on noncontrast imaging. The heart size is normal. There is no pericardial effusion. Mediastinum/Nodes: Small mediastinal lymph nodes are unchanged. Hilar assessment is limited by the lack of intravenous contrast, although the hilar contours appear unchanged. The thyroid gland, trachea and esophagus demonstrate no significant findings. Lungs/Pleura: Small left pleural effusion has decreased in volume. There is no significant pleural fluid on the right. Stable postsurgical changes status post left lower lobe resection. There is a stable calcified lingular granuloma. There is a stable small perifissural nodule along the minor fissure on image 63. There is a stable tiny right upper lobe nodule on image 34. No suspicious pulmonary nodules. Upper abdomen: The visualized upper abdomen appears stable without suspicious findings. Low-density hepatic lesion is unchanged, likely a cyst. Musculoskeletal/Chest wall: There is no chest wall mass or suspicious osseous finding. Thoracotomy defects noted on the left. Several probable epidermal inclusion (sebaceous) cysts again noted posteriorly in the right chest wall. IMPRESSION: 1. Interval improvement in small loculated left pleural effusion following left lower lobe resection. 2. No evidence of local recurrence or metastatic disease. 3. Atherosclerosis, as before. Electronically Signed   By: Richardean Sale M.D.   On: 02/23/2016 11:52   I have independently reviewed the above radiology studies  and reviewed the findings with the patient.     Ct Chest Wo Contrast  09/26/2015  CLINICAL DATA:  Left lower lobe lung cancer and resection. Weight loss. EXAM: CT CHEST WITHOUT CONTRAST TECHNIQUE: Multidetector CT imaging of the chest was performed following the standard protocol without IV contrast. COMPARISON:  PET 03/07/2015 and CT chest 08/05/2014. FINDINGS: Mediastinum/Nodes: Mediastinal lymph nodes are not enlarged by CT size criteria.  Hilar regions are difficult to definitively evaluate without IV contrast. No axillary adenopathy. Atherosclerotic calcification  of the arterial vasculature, including three-vessel involvement of the coronary arteries. Heart size normal. No pericardial effusion. Lungs/Pleura: Postoperative changes of left lower lobectomy with a small left pleural effusion. Minimal linear volume loss in the left upper lobe. Calcified granuloma in the lingula. 4 mm right upper lobe nodule (4/19), unchanged. Airway is otherwise unremarkable. Upper abdomen: Low-attenuation lesion in the dome of the liver measures 1.8 cm, as before. Visualized portions of the liver, gallbladder, adrenal glands, kidneys, spleen, pancreas, stomach and bowel are otherwise grossly unremarkable. No upper abdominal adenopathy. Musculoskeletal: No worrisome lytic or sclerotic lesions. Degenerative changes are seen in the spine. Thoracotomy changes on the left. Probable subcutaneous sebaceous cysts overlying the right scapula and posterior right ribs. IMPRESSION: 1. Postoperative changes of left lower lobectomy without evidence of recurrent or metastatic disease. 2. Small left pleural effusion. 3. Three-vessel coronary artery calcification. Electronically Signed   By: Lorin Picket M.D.   On: 09/26/2015 14:18      Recent Lab Findings: Lab Results  Component Value Date   WBC 7.8 11/14/2017   HGB 17.0 11/14/2017   HCT 50.4 (H) 11/14/2017   PLT 236 11/14/2017   GLUCOSE 111 (H) 11/14/2017   CHOL 153 11/14/2017   TRIG 180 (H) 11/14/2017   HDL 35 (L) 11/14/2017   LDLCALC 90 11/14/2017   ALT 14 11/14/2017   AST 20 11/14/2017   NA 139 11/14/2017   K 4.5 11/14/2017   CL 104 11/14/2017   CREATININE 0.94 11/14/2017   BUN 19 11/14/2017   CO2 27 11/14/2017   TSH 1.01 02/08/2016   INR 1.00 04/20/2015   HGBA1C 5.3 11/14/2017   Cancer Staging Lung cancer, lower lobe ([place]) Staging form: Lung, AJCC 6th Edition - Pathologic stage from 04/25/2015:  Stage IA (T1, N0, M0) - Signed by Grace Isaac, MD on 04/28/2015     Assessment / Plan:    Patient without evidence of recurrent disease following left lower lobectomy for 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 -Follow-up CT scan shows no evidence of recurrence.  Again encouraged the patient to stop smoking.  We will see him back with a follow-up CT of the chest in 1 year  Grace Isaac MD      [name] [place] [address] Office [phone]   Beeper [phone]  01/27/2018 2:02 PM

## 2018-01-28 ENCOUNTER — Encounter: Payer: PPO | Admitting: Cardiothoracic Surgery

## 2018-02-01 ENCOUNTER — Other Ambulatory Visit: Payer: Self-pay | Admitting: Nurse Practitioner

## 2018-03-12 ENCOUNTER — Other Ambulatory Visit: Payer: Self-pay | Admitting: Nurse Practitioner

## 2018-04-02 ENCOUNTER — Other Ambulatory Visit: Payer: Self-pay | Admitting: Nurse Practitioner

## 2018-04-06 ENCOUNTER — Other Ambulatory Visit: Payer: Self-pay | Admitting: Nurse Practitioner

## 2018-04-17 ENCOUNTER — Other Ambulatory Visit: Payer: PPO

## 2018-04-17 DIAGNOSIS — E782 Mixed hyperlipidemia: Secondary | ICD-10-CM

## 2018-04-17 DIAGNOSIS — R972 Elevated prostate specific antigen [PSA]: Secondary | ICD-10-CM

## 2018-04-18 LAB — LIPID PANEL
CHOL/HDL RATIO: 4.6 (calc) (ref <5.0)
Cholesterol: 155 mg/dL (ref <200)
HDL: 34 mg/dL — AB (ref >40)
LDL Cholesterol (Calc): 91 mg/dL (calc)
NON-HDL CHOLESTEROL (CALC): 121 mg/dL (ref <130)
Triglycerides: 199 mg/dL — ABNORMAL HIGH (ref <150)

## 2018-04-18 LAB — COMPLETE METABOLIC PANEL WITH GFR
AG RATIO: 1.7 (calc) (ref 1.0–2.5)
ALT: 12 U/L (ref 9–46)
AST: 17 U/L (ref 10–35)
Albumin: 4.2 g/dL (ref 3.6–5.1)
Alkaline phosphatase (APISO): 64 U/L (ref 40–115)
BUN: 15 mg/dL (ref 7–25)
CO2: 24 mmol/L (ref 20–32)
CREATININE: 0.85 mg/dL (ref 0.70–1.18)
Calcium: 9.1 mg/dL (ref 8.6–10.3)
Chloride: 105 mmol/L (ref 98–110)
GFR, EST NON AFRICAN AMERICAN: 86 mL/min/{1.73_m2} (ref >=60)
GFR, Est African American: 99 mL/min/{1.73_m2} (ref >=60)
Globulin: 2.5 g/dL (calc) (ref 1.9–3.7)
Glucose, Bld: 103 mg/dL — ABNORMAL HIGH (ref 65–99)
POTASSIUM: 4.2 mmol/L (ref 3.5–5.3)
SODIUM: 139 mmol/L (ref 135–146)
TOTAL PROTEIN: 6.7 g/dL (ref 6.1–8.1)
Total Bilirubin: 0.6 mg/dL (ref 0.2–1.2)

## 2018-04-21 ENCOUNTER — Ambulatory Visit: Payer: PPO | Admitting: Nurse Practitioner

## 2018-04-24 ENCOUNTER — Encounter: Payer: Self-pay | Admitting: Nurse Practitioner

## 2018-04-24 ENCOUNTER — Ambulatory Visit (INDEPENDENT_AMBULATORY_CARE_PROVIDER_SITE_OTHER): Payer: PPO | Admitting: Nurse Practitioner

## 2018-04-24 VITALS — BP 128/78 | HR 66 | Temp 98.0°F | Ht 67.0 in | Wt 222.8 lb

## 2018-04-24 DIAGNOSIS — I10 Essential (primary) hypertension: Secondary | ICD-10-CM

## 2018-04-24 DIAGNOSIS — Z85118 Personal history of other malignant neoplasm of bronchus and lung: Secondary | ICD-10-CM

## 2018-04-24 DIAGNOSIS — R739 Hyperglycemia, unspecified: Secondary | ICD-10-CM

## 2018-04-24 DIAGNOSIS — Z720 Tobacco use: Secondary | ICD-10-CM | POA: Diagnosis not present

## 2018-04-24 DIAGNOSIS — R972 Elevated prostate specific antigen [PSA]: Secondary | ICD-10-CM

## 2018-04-24 DIAGNOSIS — E782 Mixed hyperlipidemia: Secondary | ICD-10-CM

## 2018-04-24 NOTE — Progress Notes (Signed)
Careteam: Patient Care Team: [name], NP as PCP - General (Nurse Practitioner) Druscilla Brownie, MD as Consulting Physician (Dermatology)  Advanced Directive information Does Patient Have a Medical Advance Directive?: No  Allergies  Allergen Reactions  . Asa [Aspirin] Anaphylaxis and Swelling    Angioedema/Eyes and lip swelling  . Nsaids Hives  . Tolmetin Hives  . Naproxen Hives    Chief Complaint  Patient presents with  . Medical Management of Chronic Issues    pt is being seen for a 5 month routine visit.   . Audit C Screening    score of 0  . ACP    Pt has no ACP     HPI: Patient is a 77 y.o. male seen in the office today for routine follow up.  Pt continues to stop smoking, but not motivated most of the time. When he feels like he wants to quit it is hard for him to actually do it. Has cut back.  Pt s/p Left lower lobectomy and lymph node dissection due to sarcomatoid of the left lower lung.  Follow up CT done and remains stable, will follow up in 1 year with CT chest.  No changes in urinary frequency or flow.  Blood pressure remains stable taking hydralazine, losartan and metoprolol  No exercise but stays busy and active.  Already got flu shot at [facility] in august  Colonoscopy was in 2017- follow up in 5 years.  Has decided he is going to be "fat and happy vs sad and skinny" States he is eating better now.  Review of Systems:  Review of Systems  Constitutional: Negative for chills, fever and weight loss.  HENT: Negative for tinnitus.   Respiratory: Positive for cough (chronic, no changes). Negative for sputum production and shortness of breath.        Ongoing follow up with surgery due to hx of lung cancer  Cardiovascular: Negative for chest pain, palpitations and leg swelling.  Gastrointestinal: Negative for abdominal pain, constipation, diarrhea and heartburn.  Genitourinary: Negative for dysuria, frequency and urgency.  Musculoskeletal:  Negative for back pain, falls, joint pain and myalgias.  Skin: Negative.   Neurological: Negative for dizziness and headaches.  Psychiatric/Behavioral: Negative for depression and memory loss. The patient does not have insomnia.     Past Medical History:  Diagnosis Date  . A-fib ([place])   . Acute appendicitis with peritoneal abscess 01/28/2013  . Arthritis   . Bacteremia September 2015  . Elevated PSA   . Essential hypertension 04/16/2014  . Heart murmur    childhood  . Ileus, postoperative ([place]) 01/28/2013  . Lung cancer ([place])   . Obesity, morbid ([place]) 01/28/2013  . Shortness of breath   . Tobacco use disorder 01/28/2013   Past Surgical History:  Procedure Laterality Date  . APPENDECTOMY    . [place], 2003   Dr.Apleton   . CARPAL TUNNEL RELEASE    . COLONOSCOPY     x 3  . EYE SURGERY Left    muscle of the eye   . KNEE SURGERY Bilateral   . LAPAROSCOPIC APPENDECTOMY N/A 01/25/2013   Procedure: APPENDECTOMY LAPAROSCOPIC;  Surgeon: Ralene Ok, MD;  Location: [facility];  Service: General;  Laterality: N/A;  . LYMPH NODE DISSECTION Left 04/26/2015   Procedure: LYMPH NODE DISSECTION;  Surgeon: Grace Isaac, MD;  Location: [facility];  Service: Thoracic;  Laterality: Left;  . SHOULDER SURGERY Right 2014   Dr.Whitfield rotator cuff surgery  .  TEE WITHOUT CARDIOVERSION N/A 04/07/2014   Procedure: TRANSESOPHAGEAL ECHOCARDIOGRAM (TEE);  Surgeon: Candee Furbish, MD;  Location[facility] ENDOSCOPY;  Service: Cardiovascular;  Laterality: N/A;  . VIDEO ASSISTED THORACOSCOPY (VATS)/WEDGE RESECTION Left 04/26/2015   Procedure: LEFT VIDEO ASSISTED THORACOSCOPY (VATS) WITH LEFT LOWER LOBECTOMY;  Surgeon: Grace Isaac, MD;  Location: [facility];  Service: Thoracic;  Laterality: Left;  Marland Kitchen VIDEO BRONCHOSCOPY N/A 04/26/2015   Procedure: VIDEO BRONCHOSCOPY;  Surgeon: Grace Isaac, MD;  Location: [facility];  Service: Thoracic;  Laterality: N/A;   Social History:   reports that he has been smoking  cigarettes and cigars. He started smoking about 59 years ago. He has a 30.00 pack-year smoking history. He has quit using smokeless tobacco.  His smokeless tobacco use included chew. He reports that he does not drink alcohol or use drugs.  Family History  Problem Relation Age of Onset  . Irritable bowel syndrome Mother   . Cancer Neg Hx   . Lung disease Neg Hx   . Colon cancer Neg Hx   . Colon polyps Neg Hx     Medications: Patient's Medications  New Prescriptions   No medications on file  Previous Medications   HYDRALAZINE (APRESOLINE) 25 MG TABLET    TAKE 1 TABLET BY MOUTH THREE TIMES DAILY FOR BLOOD PRESSURE   LOSARTAN (COZAAR) 50 MG TABLET    TAKE 1 TABLET BY MOUTH EVERY DAY FOR BLOOD PRESSURE   METOPROLOL TARTRATE (LOPRESSOR) 25 MG TABLET    TAKE 1/2 TABLET(12.5 MG) BY MOUTH TWICE DAILY  Modified Medications   No medications on file  Discontinued Medications   No medications on file     Physical Exam:  Vitals:   04/24/18 1012  BP: 128/78  Pulse: 66  Temp: 98 F (36.7 C)  TempSrc: Oral  SpO2: 97%  Weight: 222 lb 12.8 oz (101.1 kg)  Height: 5\' 7"  (1.702 m)   Body mass index is 34.9 kg/m.  Physical Exam  Constitutional: He is oriented to person, place, and time. He appears well-developed and well-nourished. No distress.  HENT:  Head: Normocephalic and atraumatic.  Mouth/Throat: Oropharynx is clear and moist. No oropharyngeal exudate.  Eyes: Pupils are equal, round, and reactive to light. Conjunctivae and EOM are normal.  Neck: Normal range of motion. Neck supple.  Cardiovascular: Normal rate, regular rhythm and normal heart sounds.  Pulmonary/Chest: Effort normal.  Diminished throughout   Abdominal: Soft. Bowel sounds are normal.  Musculoskeletal: He exhibits no edema or tenderness.  Neurological: He is alert and oriented to person, place, and time.  Skin: Skin is warm and dry. He is not diaphoretic.  Psychiatric: He has a normal mood and affect.    Labs  reviewed: Basic Metabolic Panel: Recent Labs    11/14/17 0827 04/17/18 0820  NA 139 139  K 4.5 4.2  CL 104 105  CO2 27 24  GLUCOSE 111* 103*  BUN 19 15  CREATININE 0.94 0.85  CALCIUM 9.2 9.1   Liver Function Tests: Recent Labs    11/14/17 0827 04/17/18 0820  AST 20 17  ALT 14 12  BILITOT 0.6 0.6  PROT 6.8 6.7   No results for input(s): LIPASE, AMYLASE in the last 8760 hours. No results for input(s): AMMONIA in the last 8760 hours. CBC: Recent Labs    11/14/17 0827  WBC 7.8  NEUTROABS 5,694  HGB 17.0  HCT 50.4*  MCV 91.8  PLT 236   Lipid Panel: Recent Labs    11/14/17 0827  04/17/18 0820  CHOL 153 155  HDL 35* 34*  LDLCALC 90 91  TRIG 180* 199*  CHOLHDL 4.4 4.6   TSH: No results for input(s): TSH in the last 8760 hours. A1C: Lab Results  Component Value Date   HGBA1C 5.3 11/14/2017     Assessment/Plan 1. Essential hypertension Controlled on current medication. Discussed dietary modifications. Dash diet given.   - BASIC METABOLIC PANEL WITH GFR; Future - CBC with Differential/Platelets; Future  2. Mixed hyperlipidemia Triglycerides elevated, discussed dietary modifications and increasing activity.   3. Tobacco abuse Encouraged cessation   4. History of lung cancer -continues to follow up with cardiothoracic surgeon with CT of chest   5. Elevated PSA -no symptoms at this time, recent PSA in normal range, will follow  - PSA; Future  6. Hyperglycemia -encouraged dietary changes with increase in activity.  - Hemoglobin A1c; Future  Next appt: 08/26/2018 Carlos American. [place], [facility] [phone]

## 2018-04-24 NOTE — Patient Instructions (Signed)
DASH Eating Plan DASH stands for "Dietary Approaches to Stop Hypertension." The DASH eating plan is a healthy eating plan that has been shown to reduce high blood pressure (hypertension). It may also reduce your risk for type 2 diabetes, heart disease, and stroke. The DASH eating plan may also help with weight loss. What are tips for following this plan? General guidelines  Avoid eating more than 2,300 mg (milligrams) of salt (sodium) a day. If you have hypertension, you may need to reduce your sodium intake to 1,500 mg a day.  Limit alcohol intake to no more than 1 drink a day for nonpregnant women and 2 drinks a day for men. One drink equals 12 oz of beer, 5 oz of wine, or 1 oz of hard liquor.  Work with your health care provider to maintain a healthy body weight or to lose weight. Ask what an ideal weight is for you.  Get at least 30 minutes of exercise that causes your heart to beat faster (aerobic exercise) most days of the week. Activities may include walking, swimming, or biking.  Work with your health care provider or diet and nutrition specialist (dietitian) to adjust your eating plan to your individual calorie needs. Reading food labels  Check food labels for the amount of sodium per serving. Choose foods with less than 5 percent of the Daily Value of sodium. Generally, foods with less than 300 mg of sodium per serving fit into this eating plan.  To find whole grains, look for the word "whole" as the first word in the ingredient list. Shopping  Buy products labeled as "low-sodium" or "no salt added."  Buy fresh foods. Avoid canned foods and premade or frozen meals. Cooking  Avoid adding salt when cooking. Use salt-free seasonings or herbs instead of table salt or sea salt. Check with your health care provider or pharmacist before using salt substitutes.  Do not fry foods. Cook foods using healthy methods such as baking, boiling, grilling, and broiling instead.  Cook with  heart-healthy oils, such as olive, canola, soybean, or sunflower oil. Meal planning   Eat a balanced diet that includes: ? 5 or more servings of fruits and vegetables each day. At each meal, try to fill half of your plate with fruits and vegetables. ? Up to 6-8 servings of whole grains each day. ? Less than 6 oz of lean meat, poultry, or fish each day. A 3-oz serving of meat is about the same size as a deck of cards. One egg equals 1 oz. ? 2 servings of low-fat dairy each day. ? A serving of nuts, seeds, or beans 5 times each week. ? Heart-healthy fats. Healthy fats called Omega-3 fatty acids are found in foods such as flaxseeds and coldwater fish, like sardines, salmon, and mackerel.  Limit how much you eat of the following: ? Canned or prepackaged foods. ? Food that is high in trans fat, such as fried foods. ? Food that is high in saturated fat, such as fatty meat. ? Sweets, desserts, sugary drinks, and other foods with added sugar. ? Full-fat dairy products.  Do not salt foods before eating.  Try to eat at least 2 vegetarian meals each week.  Eat more home-cooked food and less restaurant, buffet, and fast food.  When eating at a restaurant, ask that your food be prepared with less salt or no salt, if possible. What foods are recommended? The items listed may not be a complete list. Talk with your dietitian about what   dietary choices are best for you. Grains Whole-grain or whole-wheat bread. Whole-grain or whole-wheat pasta. Brown rice. Oatmeal. Quinoa. Bulgur. Whole-grain and low-sodium cereals. Pita bread. Low-fat, low-sodium crackers. Whole-wheat flour tortillas. Vegetables Fresh or frozen vegetables (raw, steamed, roasted, or grilled). Low-sodium or reduced-sodium tomato and vegetable juice. Low-sodium or reduced-sodium tomato sauce and tomato paste. Low-sodium or reduced-sodium canned vegetables. Fruits All fresh, dried, or frozen fruit. Canned fruit in natural juice (without  added sugar). Meat and other protein foods Skinless chicken or turkey. Ground chicken or turkey. Pork with fat trimmed off. Fish and seafood. Egg whites. Dried beans, peas, or lentils. Unsalted nuts, nut butters, and seeds. Unsalted canned beans. Lean cuts of beef with fat trimmed off. Low-sodium, lean deli meat. Dairy Low-fat (1%) or fat-free (skim) milk. Fat-free, low-fat, or reduced-fat cheeses. Nonfat, low-sodium ricotta or cottage cheese. Low-fat or nonfat yogurt. Low-fat, low-sodium cheese. Fats and oils Soft margarine without trans fats. Vegetable oil. Low-fat, reduced-fat, or light mayonnaise and salad dressings (reduced-sodium). Canola, safflower, olive, soybean, and sunflower oils. Avocado. Seasoning and other foods Herbs. Spices. Seasoning mixes without salt. Unsalted popcorn and pretzels. Fat-free sweets. What foods are not recommended? The items listed may not be a complete list. Talk with your dietitian about what dietary choices are best for you. Grains Baked goods made with fat, such as croissants, muffins, or some breads. Dry pasta or rice meal packs. Vegetables Creamed or fried vegetables. Vegetables in a cheese sauce. Regular canned vegetables (not low-sodium or reduced-sodium). Regular canned tomato sauce and paste (not low-sodium or reduced-sodium). Regular tomato and vegetable juice (not low-sodium or reduced-sodium). Pickles. Olives. Fruits Canned fruit in a light or heavy syrup. Fried fruit. Fruit in cream or butter sauce. Meat and other protein foods Fatty cuts of meat. Ribs. Fried meat. Bacon. Sausage. Bologna and other processed lunch meats. Salami. Fatback. Hotdogs. Bratwurst. Salted nuts and seeds. Canned beans with added salt. Canned or smoked fish. Whole eggs or egg yolks. Chicken or turkey with skin. Dairy Whole or 2% milk, cream, and half-and-half. Whole or full-fat cream cheese. Whole-fat or sweetened yogurt. Full-fat cheese. Nondairy creamers. Whipped toppings.  Processed cheese and cheese spreads. Fats and oils Butter. Stick margarine. Lard. Shortening. Ghee. Bacon fat. Tropical oils, such as coconut, palm kernel, or palm oil. Seasoning and other foods Salted popcorn and pretzels. Onion salt, garlic salt, seasoned salt, table salt, and sea salt. Worcestershire sauce. Tartar sauce. Barbecue sauce. Teriyaki sauce. Soy sauce, including reduced-sodium. Steak sauce. Canned and packaged gravies. Fish sauce. Oyster sauce. Cocktail sauce. Horseradish that you find on the shelf. Ketchup. Mustard. Meat flavorings and tenderizers. Bouillon cubes. Hot sauce and Tabasco sauce. Premade or packaged marinades. Premade or packaged taco seasonings. Relishes. Regular salad dressings. Where to find more information:  National Heart, Lung, and Blood Institute: www.nhlbi.nih.gov  American Heart Association: www.heart.org Summary  The DASH eating plan is a healthy eating plan that has been shown to reduce high blood pressure (hypertension). It may also reduce your risk for type 2 diabetes, heart disease, and stroke.  With the DASH eating plan, you should limit salt (sodium) intake to 2,300 mg a day. If you have hypertension, you may need to reduce your sodium intake to 1,500 mg a day.  When on the DASH eating plan, aim to eat more fresh fruits and vegetables, whole grains, lean proteins, low-fat dairy, and heart-healthy fats.  Work with your health care provider or diet and nutrition specialist (dietitian) to adjust your eating plan to your individual   calorie needs. This information is not intended to replace advice given to you by your health care provider. Make sure you discuss any questions you have with your health care provider. Document Released: 06/21/2011 Document Revised: 06/25/2016 Document Reviewed: 06/25/2016 Elsevier Interactive Patient Education  2018 Elsevier Inc.  

## 2018-06-05 ENCOUNTER — Other Ambulatory Visit: Payer: Self-pay | Admitting: Nurse Practitioner

## 2018-07-10 ENCOUNTER — Other Ambulatory Visit: Payer: Self-pay | Admitting: Nurse Practitioner

## 2018-08-26 ENCOUNTER — Other Ambulatory Visit: Payer: PPO

## 2018-08-26 DIAGNOSIS — R739 Hyperglycemia, unspecified: Secondary | ICD-10-CM | POA: Diagnosis not present

## 2018-08-26 DIAGNOSIS — I10 Essential (primary) hypertension: Secondary | ICD-10-CM | POA: Diagnosis not present

## 2018-08-26 DIAGNOSIS — R972 Elevated prostate specific antigen [PSA]: Secondary | ICD-10-CM | POA: Diagnosis not present

## 2018-08-27 LAB — CBC WITH DIFFERENTIAL/PLATELET
Absolute Monocytes: 598 cells/uL (ref 200–950)
Basophils Absolute: 29 cells/uL (ref 0–200)
Basophils Relative: 0.4 %
EOS PCT: 1.1 %
Eosinophils Absolute: 79 cells/uL (ref 15–500)
HCT: 50.8 % — ABNORMAL HIGH (ref 38.5–50.0)
Hemoglobin: 17.7 g/dL — ABNORMAL HIGH (ref 13.2–17.1)
Lymphs Abs: 1375 cells/uL (ref 850–3900)
MCH: 32.8 pg (ref 27.0–33.0)
MCHC: 34.8 g/dL (ref 32.0–36.0)
MCV: 94.2 fL (ref 80.0–100.0)
MPV: 11.1 fL (ref 7.5–12.5)
Monocytes Relative: 8.3 %
Neutro Abs: 5119 cells/uL (ref 1500–7800)
Neutrophils Relative %: 71.1 %
Platelets: 221 10*3/uL (ref 140–400)
RBC: 5.39 10*6/uL (ref 4.20–5.80)
RDW: 13 % (ref 11.0–15.0)
Total Lymphocyte: 19.1 %
WBC: 7.2 10*3/uL (ref 3.8–10.8)

## 2018-08-27 LAB — BASIC METABOLIC PANEL WITH GFR
BUN: 19 mg/dL (ref 7–25)
CO2: 31 mmol/L (ref 20–32)
Calcium: 9.3 mg/dL (ref 8.6–10.3)
Chloride: 105 mmol/L (ref 98–110)
Creat: 0.82 mg/dL (ref 0.70–1.18)
GFR, Est African American: 100 mL/min/{1.73_m2} (ref >=60)
GFR, Est Non African American: 87 mL/min/{1.73_m2} (ref >=60)
Glucose, Bld: 98 mg/dL (ref 65–99)
Potassium: 4.5 mmol/L (ref 3.5–5.3)
Sodium: 141 mmol/L (ref 135–146)

## 2018-08-27 LAB — PSA: PSA: 4.2 ng/mL — ABNORMAL HIGH (ref <=4.0)

## 2018-08-27 LAB — HEMOGLOBIN A1C
Hgb A1c MFr Bld: 5.2 % of total Hgb (ref <5.7)
Mean Plasma Glucose: 103 (calc)
eAG (mmol/L): 5.7 (calc)

## 2018-08-28 ENCOUNTER — Ambulatory Visit: Payer: PPO | Admitting: Nurse Practitioner

## 2018-08-28 ENCOUNTER — Ambulatory Visit (INDEPENDENT_AMBULATORY_CARE_PROVIDER_SITE_OTHER): Payer: PPO | Admitting: Nurse Practitioner

## 2018-08-28 ENCOUNTER — Encounter: Payer: Self-pay | Admitting: Nurse Practitioner

## 2018-08-28 VITALS — BP 124/60 | HR 66 | Temp 98.0°F | Ht 67.0 in | Wt 223.2 lb

## 2018-08-28 DIAGNOSIS — E782 Mixed hyperlipidemia: Secondary | ICD-10-CM

## 2018-08-28 DIAGNOSIS — Z85118 Personal history of other malignant neoplasm of bronchus and lung: Secondary | ICD-10-CM

## 2018-08-28 DIAGNOSIS — H6122 Impacted cerumen, left ear: Secondary | ICD-10-CM | POA: Diagnosis not present

## 2018-08-28 DIAGNOSIS — Z720 Tobacco use: Secondary | ICD-10-CM

## 2018-08-28 DIAGNOSIS — I10 Essential (primary) hypertension: Secondary | ICD-10-CM | POA: Diagnosis not present

## 2018-08-28 DIAGNOSIS — Z716 Tobacco abuse counseling: Secondary | ICD-10-CM | POA: Diagnosis not present

## 2018-08-28 DIAGNOSIS — R739 Hyperglycemia, unspecified: Secondary | ICD-10-CM | POA: Diagnosis not present

## 2018-08-28 DIAGNOSIS — R972 Elevated prostate specific antigen [PSA]: Secondary | ICD-10-CM | POA: Diagnosis not present

## 2018-08-28 NOTE — Progress Notes (Signed)
Careteam: Patient Care Team: [name], NP as PCP - General (Nurse Practitioner) Druscilla Brownie, MD as Consulting Physician (Dermatology)  Advanced Directive information Does Patient Have a Medical Advance Directive?: No, Would patient like information on creating a medical advance directive?: No - Patient declined  Allergies  Allergen Reactions  . Asa [Aspirin] Anaphylaxis and Swelling    Angioedema/Eyes and lip swelling  . Nsaids Hives  . Tolmetin Hives  . Naproxen Hives    Chief Complaint  Patient presents with  . Medical Management of Chronic Issues    4 month follow up     HPI: Patient is a 78 y.o. male seen in the office today routine follow up.   Elevated PSA- asymptomatic during the day, gets up at night 1-2 times which has been going on for 20 years +, PSA as high as  6.4 in 2016 then down to 3.3 2018, currently 4.2 does not wish to go to urology for biopsy or followup.   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   HTN- controlled on hydralazne, losartan and metoprolol  Hyperlipidemia- has made dietary modification. Eating better and trying to lose weight, reports he has lost 10 lbs.  Triglycerides elevated at 199.  hyperglycemia glucose improved on recent labs A1c 5.2.  Has cut back on cookies and bread  No current complaints.   Review of Systems:  Review of Systems  Constitutional: Negative for chills, fever and weight loss.  HENT: Negative for tinnitus.   Respiratory: Positive for cough (chronic, no changes). Negative for sputum production and shortness of breath.        Ongoing follow up with surgery due to hx of lung cancer  Cardiovascular: Negative for chest pain, palpitations and leg swelling.  Gastrointestinal: Negative for abdominal pain, constipation, diarrhea and heartburn.  Genitourinary: Negative for dysuria, frequency and urgency.  Musculoskeletal: Positive for back pain  (not worse but there). Negative for falls, joint pain and myalgias.  Skin: Negative.   Neurological: Negative for dizziness and headaches.  Psychiatric/Behavioral: Negative for depression and memory loss. The patient does not have insomnia.     Past Medical History:  Diagnosis Date  . A-fib ([place])   . Acute appendicitis with peritoneal abscess 01/28/2013  . Arthritis   . Bacteremia September 2015  . Elevated PSA   . Essential hypertension 04/16/2014  . Heart murmur    childhood  . Ileus, postoperative ([place]) 01/28/2013  . Lung cancer ([place])   . Obesity, morbid ([place]) 01/28/2013  . Shortness of breath   . Tobacco use disorder 01/28/2013   Past Surgical History:  Procedure Laterality Date  . APPENDECTOMY    . [place], 2003   Dr.Apleton   . CARPAL TUNNEL RELEASE    . COLONOSCOPY     x 3  . EYE SURGERY Left    muscle of the eye   . KNEE SURGERY Bilateral   . LAPAROSCOPIC APPENDECTOMY N/A 01/25/2013   Procedure: APPENDECTOMY LAPAROSCOPIC;  Surgeon: Ralene Ok, MD;  Location: [facility];  Service: General;  Laterality: N/A;  . LYMPH NODE DISSECTION Left 04/26/2015   Procedure: LYMPH NODE DISSECTION;  Surgeon: Grace Isaac, MD;  Location: [facility];  Service: Thoracic;  Laterality: Left;  . SHOULDER SURGERY Right 2014   Dr.Whitfield rotator cuff surgery  . TEE WITHOUT CARDIOVERSION N/A 04/07/2014   Procedure: TRANSESOPHAGEAL ECHOCARDIOGRAM (TEE);  Surgeon: Candee Furbish, MD;  Location: [facility] ENDOSCOPY;  Service: Cardiovascular;  Laterality: N/A;  . VIDEO ASSISTED THORACOSCOPY (VATS)/WEDGE RESECTION Left 04/26/2015   Procedure: LEFT VIDEO ASSISTED THORACOSCOPY (VATS) WITH LEFT LOWER LOBECTOMY;  Surgeon: Grace Isaac, MD;  Location: [facility];  Service: Thoracic;  Laterality: Left;  Marland Kitchen VIDEO BRONCHOSCOPY N/A 04/26/2015   Procedure: VIDEO BRONCHOSCOPY;  Surgeon: Grace Isaac, MD;  Location: [facility];  Service: Thoracic;  Laterality: N/A;   Social History:   reports that he has been  smoking cigarettes and cigars. He started smoking about 60 years ago. He has a 30.00 pack-year smoking history. He has quit using smokeless tobacco.  His smokeless tobacco use included chew. He reports that he does not drink alcohol or use drugs.  Family History  Problem Relation Age of Onset  . Irritable bowel syndrome Mother   . Cancer Neg Hx   . Lung disease Neg Hx   . Colon cancer Neg Hx   . Colon polyps Neg Hx     Medications: Patient's Medications  New Prescriptions   No medications on file  Previous Medications   HYDRALAZINE (APRESOLINE) 25 MG TABLET    TAKE 1 TABLET BY MOUTH THREE TIMES DAILY FOR BLOOD PRESSURE   LOSARTAN (COZAAR) 50 MG TABLET    TAKE 1 TABLET BY MOUTH EVERY DAY FOR BLOOD PRESSURE   METOPROLOL TARTRATE (LOPRESSOR) 25 MG TABLET    TAKE 1/2 TABLET(12.5 MG) BY MOUTH TWICE DAILY  Modified Medications   No medications on file  Discontinued Medications   No medications on file     Physical Exam:  Vitals:   08/28/18 1031  BP: 124/60  Pulse: 66  Temp: 98 F (36.7 C)  TempSrc: Oral  SpO2: 96%  Weight: 223 lb 3.2 oz (101.2 kg)  Height: 5\' 7"  (1.702 m)   Body mass index is 34.96 kg/m.  Physical Exam Constitutional:      General: He is not in acute distress.    Appearance: He is well-developed. He is not diaphoretic.  HENT:     Head: Normocephalic and atraumatic.     Right Ear: Tympanic membrane and ear canal normal.     Left Ear: There is impacted cerumen.     Mouth/Throat:     Pharynx: No oropharyngeal exudate.  Eyes:     Conjunctiva/sclera: Conjunctivae normal.     Pupils: Pupils are equal, round, and reactive to light.  Neck:     Musculoskeletal: Normal range of motion and neck supple.  Cardiovascular:     Rate and Rhythm: Normal rate and regular rhythm.     Heart sounds: Normal heart sounds.  Pulmonary:     Effort: Pulmonary effort is normal.  Abdominal:     General: Bowel sounds are normal.     Palpations: Abdomen is soft.    Musculoskeletal:        General: No tenderness.  Skin:    General: Skin is warm and dry.  Neurological:     Mental Status: He is alert and oriented to person, place, and time.     Labs reviewed: Basic Metabolic Panel: Recent Labs    11/14/17 0827 04/17/18 0820 08/26/18 0809  NA 139 139 141  K 4.5 4.2 4.5  CL 104 105 105  CO2 27 24 31   GLUCOSE 111* 103* 98  BUN 19 15 19   CREATININE 0.94 0.85 0.82  CALCIUM 9.2 9.1 9.3   Liver Function Tests: Recent Labs    11/14/17 0827 04/17/18 0820  AST 20 17  ALT 14 12  BILITOT 0.6 0.6  PROT 6.8 6.7   No results for input(s): LIPASE, AMYLASE in the last 8760 hours. No results for input(s): AMMONIA in the last 8760 hours. CBC: Recent Labs    11/14/17 0827 08/26/18 0809  WBC 7.8 7.2  NEUTROABS 5,694 5,119  HGB 17.0 17.7*  HCT 50.4* 50.8*  MCV 91.8 94.2  PLT 236 221   Lipid Panel: Recent Labs    11/14/17 0827 04/17/18 0820  CHOL 153 155  HDL 35* 34*  LDLCALC 90 91  TRIG 180* 199*  CHOLHDL 4.4 4.6   TSH: No results for input(s): TSH in the last 8760 hours. A1C: Lab Results  Component Value Date   HGBA1C 5.2 08/26/2018     Assessment/Plan 1. Essential hypertension -stable on current regimen. Will continue current medications.  - CBC with Differential/Platelets; Future  2. Elevated PSA Slight elevation in PSA, however down from what it has been in the past, discussed urology referral but he would like to hold off at this time.   3. Mixed hyperlipidemia -continue dietary modifications.  - COMPLETE METABOLIC PANEL WITH GFR; Future - Lipid Panel; Future  4. Hyperglycemia -A1c at goal. Continues diet modifications - COMPLETE METABOLIC PANEL WITH GFR; Future - Hemoglobin A1c; Future  5. Tobacco abuse Encouraged cessation, pt with hx of lung cancer s/p   6. Tobacco abuse counseling Encouraged cessation, information provided on ways to cut back. Discussed going to support group/educational session but  he was not interested.   7. History of lung cancer S/p Left lower lobectomy and lymph node dissection. Ongoing follow up with surgeon   8. Impacted cerumen of left ear - Ear Lavage done and curette used to remove part of wax however some wax remained deep in canal. Recommended to use debrox for 4 days and make follow up for repeat lavage   Next appt: 6 months with labs prior   K. [place], [facility] [phone]

## 2018-08-28 NOTE — Patient Instructions (Signed)
Follow up in 6 months with lab work prior to visit   Smoking Tobacco Information, Adult Smoking tobacco can be harmful to your health. Tobacco contains a poisonous (toxic), colorless chemical called nicotine. Nicotine is addictive. It changes the brain and can make it hard to stop smoking. Tobacco also has other toxic chemicals that can hurt your body and raise your risk of many cancers. How can smoking tobacco affect me? Smoking tobacco puts you at risk for:  Cancer. Smoking is most commonly associated with lung cancer, but can also lead to cancer in other parts of the body.  Chronic obstructive pulmonary disease (COPD). This is a long-term lung condition that makes it hard to breathe. It also gets worse over time.  High blood pressure (hypertension), heart disease, stroke, or heart attack.  Lung infections, such as pneumonia.  Cataracts. This is when the lenses in the eyes become clouded.  Digestive problems. This may include peptic ulcers, heartburn, and gastroesophageal reflux disease (GERD).  Oral health problems, such as gum disease and tooth loss.  Loss of taste and smell. Smoking can affect your appearance by causing:  Wrinkles.  Yellow or stained teeth, fingers, and fingernails. Smoking tobacco can also affect your social life, because:  It may be challenging to find places to smoke when away from home. Many workplaces, [employer], hotels, and public places are tobacco-free.  Smoking is expensive. This is due to the cost of tobacco and the long-term costs of treating health problems from smoking.  Secondhand smoke may affect those around you. Secondhand smoke can cause lung cancer, breathing problems, and heart disease. Children of smokers have a higher risk for: ? Sudden infant death syndrome (SIDS). ? Ear infections. ? Lung infections. If you currently smoke tobacco, quitting now can help you:  Lead a longer and healthier life.  Look, smell, breathe, and feel  better over time.  Save money.  Protect others from the harms of secondhand smoke. What actions can I take to prevent health problems? Quit smoking   Do not start smoking. Quit if you already do.  Make a plan to quit smoking and commit to it. Look for programs to help you and ask your health care provider for recommendations and ideas.  Set a date and write down all the reasons you want to quit.  Let your friends and family know you are quitting so they can help and support you. Consider finding friends who also want to quit. It can be easier to quit with someone else, so that you can support each other.  Talk with your health care provider about using nicotine replacement medicines to help you quit, such as gum, lozenges, patches, sprays, or pills.  Do not replace cigarette smoking with electronic cigarettes, which are commonly called e-cigarettes. The safety of e-cigarettes is not known, and some may contain harmful chemicals.  If you try to quit but return to smoking, stay positive. It is common to slip up when you first quit, so take it one day at a time.  Be prepared for cravings. When you feel the urge to smoke, chew gum or suck on hard candy. Lifestyle  Stay busy and take care of your body.  Drink enough fluid to keep your urine pale yellow.  Get plenty of exercise and eat a healthy diet. This can help prevent weight gain after quitting.  Monitor your eating habits. Quitting smoking can cause you to have a larger appetite than when you smoke.  Find ways to  relax. Go out with friends or family to a movie or a restaurant where people do not smoke.  Ask your health care provider about having regular tests (screenings) to check for cancer. This may include blood tests, imaging tests, and other tests.  Find ways to manage your stress, such as meditation, yoga, or exercise. Where to find support To get support to quit smoking, consider:  Asking your health care provider for  more information and resources.  Taking classes to learn more about quitting smoking.  Looking for local organizations that offer resources about quitting smoking.  Joining a support group for people who want to quit smoking in your local community.  Calling the smokefree.gov counselor helpline: 1-800-Quit-Now 810-392-6854) Where to find more information You may find more information about quitting smoking from:  HelpGuide.org: www.helpguide.org  https://hall.com/: smokefree.gov  American Lung Association: www.lung.org Contact a health care provider if you:  Have problems breathing.  Notice that your lips, nose, or fingers turn blue.  Have chest pain.  Are coughing up blood.  Feel faint or you pass out.  Have other health changes that cause you to worry. Summary  Smoking tobacco can negatively affect your health, the health of those around you, your finances, and your social life.  Do not start smoking. Quit if you already do. If you need help quitting, ask your health care provider.  Think about joining a support group for people who want to quit smoking in your local community. There are many effective programs that will help you to quit this behavior. This information is not intended to replace advice given to you by your health care provider. Make sure you discuss any questions you have with your health care provider. Document Released: 07/17/2016 Document Revised: 08/21/2017 Document Reviewed: 07/17/2016 Elsevier Interactive Patient Education  2019 [employer].

## 2018-09-02 ENCOUNTER — Ambulatory Visit (INDEPENDENT_AMBULATORY_CARE_PROVIDER_SITE_OTHER): Payer: PPO | Admitting: Nurse Practitioner

## 2018-09-02 ENCOUNTER — Encounter: Payer: Self-pay | Admitting: Nurse Practitioner

## 2018-09-02 VITALS — BP 118/60 | HR 67 | Temp 97.9°F | Ht 67.0 in | Wt 221.0 lb

## 2018-09-02 DIAGNOSIS — H6122 Impacted cerumen, left ear: Secondary | ICD-10-CM | POA: Diagnosis not present

## 2018-09-02 NOTE — Progress Notes (Signed)
Careteam: Patient Care Team: [name], NP as PCP - General (Nurse Practitioner) Druscilla Brownie, MD as Consulting Physician (Dermatology)  Advanced Directive information    Allergies  Allergen Reactions  . Asa [Aspirin] Anaphylaxis and Swelling    Angioedema/Eyes and lip swelling  . Nsaids Hives  . Tolmetin Hives  . Naproxen Hives    Chief Complaint  Patient presents with  . Acute Visit    Left ear fullness, per patient he removed wax from ear     HPI: Patient is a 78 y.o. male seen in the office today for evaluation of wax in left ear. Pt was seen 2/13 and noted to have cerumen impaction to left ear. Lavage in office and unable to remove. He went home and used drops and then flush ear himself and got a huge amount of wax out. Feels like he may have removed it all but wanted it check.   Review of Systems:  Review of Systems  Constitutional: Negative for chills and fever.  HENT: Positive for hearing loss. Negative for congestion, ear discharge, ear pain and sinus pain.     Past Medical History:  Diagnosis Date  . A-fib ([place])   . Acute appendicitis with peritoneal abscess 01/28/2013  . Arthritis   . Bacteremia September 2015  . Elevated PSA   . Essential hypertension 04/16/2014  . Heart murmur    childhood  . Ileus, postoperative ([place]) 01/28/2013  . Lung cancer ([place])   . Obesity, morbid ([place]) 01/28/2013  . Shortness of breath   . Tobacco use disorder 01/28/2013   Past Surgical History:  Procedure Laterality Date  . APPENDECTOMY    . [place], 2003   Dr.Apleton   . CARPAL TUNNEL RELEASE    . COLONOSCOPY     x 3  . EYE SURGERY Left    muscle of the eye   . KNEE SURGERY Bilateral   . LAPAROSCOPIC APPENDECTOMY N/A 01/25/2013   Procedure: APPENDECTOMY LAPAROSCOPIC;  Surgeon: Ralene Ok, MD;  Location: [place];  Service: General;  Laterality: N/A;  . LYMPH NODE DISSECTION Left 04/26/2015   Procedure: LYMPH NODE DISSECTION;  Surgeon:  Grace Isaac, MD;  Location: [place];  Service: Thoracic;  Laterality: Left;  . SHOULDER SURGERY Right 2014   Dr.Whitfield rotator cuff surgery  . TEE WITHOUT CARDIOVERSION N/A 04/07/2014   Procedure: TRANSESOPHAGEAL ECHOCARDIOGRAM (TEE);  Surgeon: Candee Furbish, MD;  Location: [facility] ENDOSCOPY;  Service: Cardiovascular;  Laterality: N/A;  . VIDEO ASSISTED THORACOSCOPY (VATS)/WEDGE RESECTION Left 04/26/2015   Procedure: LEFT VIDEO ASSISTED THORACOSCOPY (VATS) WITH LEFT LOWER LOBECTOMY;  Surgeon: Grace Isaac, MD;  Location: [place];  Service: Thoracic;  Laterality: Left;  Marland Kitchen VIDEO BRONCHOSCOPY N/A 04/26/2015   Procedure: VIDEO BRONCHOSCOPY;  Surgeon: Grace Isaac, MD;  Location: [place];  Service: Thoracic;  Laterality: N/A;   Social History:   reports that he has been smoking cigarettes and cigars. He started smoking about 60 years ago. He has a 30.00 pack-year smoking history. He has quit using smokeless tobacco.  His smokeless tobacco use included chew. He reports that he does not drink alcohol or use drugs.  Family History  Problem Relation Age of Onset  . Irritable bowel syndrome Mother   . Cancer Neg Hx   . Lung disease Neg Hx   . Colon cancer Neg Hx   . Colon polyps Neg Hx     Medications: Patient's Medications  New Prescriptions   No  medications on file  Previous Medications   HYDRALAZINE (APRESOLINE) 25 MG TABLET    TAKE 1 TABLET BY MOUTH THREE TIMES DAILY FOR BLOOD PRESSURE   LOSARTAN (COZAAR) 50 MG TABLET    TAKE 1 TABLET BY MOUTH EVERY DAY FOR BLOOD PRESSURE   METOPROLOL TARTRATE (LOPRESSOR) 25 MG TABLET    TAKE 1/2 TABLET(12.5 MG) BY MOUTH TWICE DAILY  Modified Medications   No medications on file  Discontinued Medications   No medications on file     Physical Exam:  Vitals:   09/02/18 0835  BP: 118/60  Pulse: 67  Temp: 97.9 F (36.6 C)  TempSrc: Oral  SpO2: 95%  Weight: 221 lb (100.2 kg)  Height: 5\' 7"  (1.702 m)   Body mass index is 34.61  kg/m.  Physical Exam Constitutional:      Appearance: Normal appearance.  HENT:     Head: Normocephalic and atraumatic.     Right Ear: Tympanic membrane, ear canal and external ear normal. There is no impacted cerumen.     Left Ear: Tympanic membrane, ear canal and external ear normal. There is no impacted cerumen.  Neurological:     Mental Status: He is alert.     Labs reviewed: Basic Metabolic Panel: Recent Labs    11/14/17 0827 04/17/18 0820 08/26/18 0809  NA 139 139 141  K 4.5 4.2 4.5  CL 104 105 105  CO2 27 24 31   GLUCOSE 111* 103* 98  BUN 19 15 19   CREATININE 0.94 0.85 0.82  CALCIUM 9.2 9.1 9.3   Liver Function Tests: Recent Labs    11/14/17 0827 04/17/18 0820  AST 20 17  ALT 14 12  BILITOT 0.6 0.6  PROT 6.8 6.7   No results for input(s): LIPASE, AMYLASE in the last 8760 hours. No results for input(s): AMMONIA in the last 8760 hours. CBC: Recent Labs    11/14/17 0827 08/26/18 0809  WBC 7.8 7.2  NEUTROABS 5,694 5,119  HGB 17.0 17.7*  HCT 50.4* 50.8*  MCV 91.8 94.2  PLT 236 221   Lipid Panel: Recent Labs    11/14/17 0827 04/17/18 0820  CHOL 153 155  HDL 35* 34*  LDLCALC 90 91  TRIG 180* 199*  CHOLHDL 4.4 4.6   TSH: No results for input(s): TSH in the last 8760 hours. A1C: Lab Results  Component Value Date   HGBA1C 5.2 08/26/2018     Assessment/Plan 1. Impacted cerumen of left ear Resolved. Pt used drops and flushed at home. Bilateral ear canal clear with good visualization of TM.  Next appt: 12/22/2018 Carlos American. [place], [facility] [phone]

## 2018-09-03 DIAGNOSIS — H0102B Squamous blepharitis left eye, upper and lower eyelids: Secondary | ICD-10-CM | POA: Diagnosis not present

## 2018-09-03 DIAGNOSIS — H2513 Age-related nuclear cataract, bilateral: Secondary | ICD-10-CM | POA: Diagnosis not present

## 2018-09-03 DIAGNOSIS — H0102A Squamous blepharitis right eye, upper and lower eyelids: Secondary | ICD-10-CM | POA: Diagnosis not present

## 2018-09-08 ENCOUNTER — Encounter: Payer: Self-pay | Admitting: Family

## 2018-10-01 ENCOUNTER — Other Ambulatory Visit: Payer: Self-pay | Admitting: Nurse Practitioner

## 2018-10-05 ENCOUNTER — Other Ambulatory Visit: Payer: Self-pay | Admitting: Nurse Practitioner

## 2018-11-20 ENCOUNTER — Other Ambulatory Visit: Payer: Self-pay | Admitting: Cardiothoracic Surgery

## 2018-11-20 DIAGNOSIS — C3430 Malignant neoplasm of lower lobe, unspecified bronchus or lung: Secondary | ICD-10-CM

## 2018-11-27 ENCOUNTER — Other Ambulatory Visit: Payer: Self-pay | Admitting: Nurse Practitioner

## 2018-12-22 ENCOUNTER — Ambulatory Visit: Payer: Self-pay

## 2018-12-22 ENCOUNTER — Encounter: Payer: Self-pay | Admitting: Family

## 2018-12-23 ENCOUNTER — Other Ambulatory Visit: Payer: Self-pay

## 2018-12-23 ENCOUNTER — Encounter: Payer: Self-pay | Admitting: Family

## 2018-12-23 ENCOUNTER — Ambulatory Visit (INDEPENDENT_AMBULATORY_CARE_PROVIDER_SITE_OTHER): Payer: PPO | Admitting: Family

## 2018-12-23 VITALS — BP 128/80 | HR 67 | Temp 98.2°F | Ht 67.0 in | Wt 211.2 lb

## 2018-12-23 DIAGNOSIS — Z0000 Encounter for general adult medical examination without abnormal findings: Secondary | ICD-10-CM

## 2018-12-23 NOTE — Progress Notes (Signed)
Subjective:   [name] is a 78 y.o. male who presents for Medicare Annual/Subsequent preventive examination.  Review of Systems:   Cardiac Risk Factors include: advanced age (>87men, >72 women);smoking/ tobacco exposure;male gender;obesity (BMI >30kg/m2);sedentary lifestyle;dyslipidemia;hypertension     Objective:    Vitals: BP 128/80   Pulse 67   Temp 98.2 F (36.8 C) (Oral)   Ht 5\' 7"  (1.702 m)   Wt 211 lb 3.2 oz (95.8 kg)   SpO2 96%   BMI 33.08 kg/m   Body mass index is 33.08 kg/m.  Advanced Directives 12/23/2018 08/28/2018 04/24/2018 12/18/2017 11/18/2017 07/24/2017 06/10/2017  Does Patient Have a Medical Advance Directive? No No No No No No No  Would patient like information on creating a medical advance directive? No - Patient declined No - Patient declined - Yes (MAU/Ambulatory/Procedural Areas - Information given) No - Patient declined - No - Patient declined  Pre-existing out of facility DNR order (yellow form or pink MOST form) - - - - - - -    Tobacco Social History   Tobacco Use  Smoking Status Current Every Day Smoker  . Packs/day: 0.50  . Years: 60.00  . Pack years: 30.00  . Types: Cigarettes, Cigars  . Start date: 07/16/1958  Smokeless Tobacco Former Systems developer  . Types: Chew     Ready to quit: Not Answered Counseling given: Not Answered   Clinical Intake:  Pre-visit preparation completed: No  Pain : No/denies pain     BMI - recorded: 33.08 Nutritional Status: BMI > 30  Obese Nutritional Risks: None Diabetes: No  How often do you need to have someone help you when you read instructions, pamphlets, or other written materials from your doctor or pharmacy?: 1 - Never What is the last grade level you completed in school?: College  Interpreter Needed?: No  Information entered by :: Hayde Kilgour FNP-C   Past Medical History:  Diagnosis Date  . A-fib ([place])   . Acute appendicitis with peritoneal abscess 01/28/2013  . Anxiety   . Arthritis   .  Bacteremia September 2015  . Elevated PSA   . Essential hypertension 04/16/2014  . Heart murmur    childhood  . Ileus, postoperative ([place]) 01/28/2013  . Lung cancer ([place])   . Obesity, morbid ([place]) 01/28/2013  . Shortness of breath   . Tobacco use disorder 01/28/2013   Past Surgical History:  Procedure Laterality Date  . APPENDECTOMY    . [place], 2003   Dr.Apleton   . CARPAL TUNNEL RELEASE    . COLONOSCOPY     x 3  . EYE SURGERY Left    muscle of the eye   . KNEE SURGERY Bilateral   . LAPAROSCOPIC APPENDECTOMY N/A 01/25/2013   Procedure: APPENDECTOMY LAPAROSCOPIC;  Surgeon: Ralene Ok, MD;  Location: [place];  Service: General;  Laterality: N/A;  . LYMPH NODE DISSECTION Left 04/26/2015   Procedure: LYMPH NODE DISSECTION;  Surgeon: Grace Isaac, MD;  Location: [place];  Service: Thoracic;  Laterality: Left;  . SHOULDER SURGERY Right 2014   Dr.Whitfield rotator cuff surgery  . TEE WITHOUT CARDIOVERSION N/A 04/07/2014   Procedure: TRANSESOPHAGEAL ECHOCARDIOGRAM (TEE);  Surgeon: Candee Furbish, MD;  Location: [facility] ENDOSCOPY;  Service: Cardiovascular;  Laterality: N/A;  . VIDEO ASSISTED THORACOSCOPY (VATS)/WEDGE RESECTION Left 04/26/2015   Procedure: LEFT VIDEO ASSISTED THORACOSCOPY (VATS) WITH LEFT LOWER LOBECTOMY;  Surgeon: Grace Isaac, MD;  Location: [place];  Service: Thoracic;  Laterality: Left;  .  VIDEO BRONCHOSCOPY N/A 04/26/2015   Procedure: VIDEO BRONCHOSCOPY;  Surgeon: Grace Isaac, MD;  Location: [facility] OR;  Service: Thoracic;  Laterality: N/A;   Family History  Problem Relation Age of Onset  . Irritable bowel syndrome Mother   . Cancer Neg Hx   . Lung disease Neg Hx   . Colon cancer Neg Hx   . Colon polyps Neg Hx    Social History   Socioeconomic History  . Marital status: Divorced    Spouse name: Not on file  . Number of children: 0  . Years of education: Not on file  . Highest education level: Not on file  Occupational History  . Occupation:  Retired  Scientific laboratory technician  . Financial resource strain: Not hard at all  . Food insecurity:    Worry: Never true    Inability: Never true  . Transportation needs:    Medical: No    Non-medical: No  Tobacco Use  . Smoking status: Current Every Day Smoker    Packs/day: 0.50    Years: 60.00    Pack years: 30.00    Types: Cigarettes, Cigars    Start date: 07/16/1958  . Smokeless tobacco: Former Systems developer    Types: Chew  Substance and Sexual Activity  . Alcohol use: No    Alcohol/week: 0.0 standard drinks  . Drug use: No  . Sexual activity: Not Currently  Lifestyle  . Physical activity:    Days per week: 0 days    Minutes per session: 0 min  . Stress: Only a little  Relationships  . Social connections:    Talks on phone: More than three times a week    Gets together: More than three times a week    Attends religious service: More than 4 times per year    Active member of club or organization: No    Attends meetings of clubs or organizations: Never    Relationship status: Divorced  Other Topics Concern  . Not on file  Social History Narrative   No diet   Yes, eats/drinks things with caffeine    Divorced, married 1979   Lives in a house, one stories , one person, no pets   Current/past profession- [employer]   Patient exercises, golf 3 days weekly        Originally from [place]. Previously lived in [place] for [place] years and moved back to [place] in 1968. No international travel. He has traveled through multiple states traveling to [place]. 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 Unsure if he was exposed to any asbestos. No mold exposure. No pets currently. Previously has owned dogs. Parakeet as a child. No hot tub exposure. Enjoys golfing.    Outpatient Encounter Medications as of 12/23/2018  Medication Sig  . hydrALAZINE (APRESOLINE) 25 MG tablet TAKE 1 TABLET BY MOUTH THREE TIMES DAILY FOR BLOOD PRESSURE  . losartan  (COZAAR) 50 MG tablet TAKE 1 TABLET BY MOUTH EVERY DAY FOR BLOOD PRESSURE  . metoprolol tartrate (LOPRESSOR) 25 MG tablet TAKE 1/2 TABLET(12.5 MG) BY MOUTH TWICE DAILY  . Nutritional Supplements (ADULT NUTRITIONAL SUPPLEMENT + PO) Taking Total Restore 1 capsule by mouth three times daily patient states started 3 - 4 weeks ago   No facility-administered encounter medications on file as of 12/23/2018.     Activities of Daily Living In your present state of health, do you have any difficulty performing the following activities: 12/23/2018  Hearing? N  Vision? N  Difficulty concentrating or making decisions? N  Walking or climbing stairs? Y  Comment hx back surgery   Dressing or bathing? N  Doing errands, shopping? N  Preparing Food and eating ? N  Using the Toilet? N  In the past six months, have you accidently leaked urine? N  Do you have problems with loss of bowel control? N  Managing your Medications? N  Managing your Finances? N  Housekeeping or managing your Housekeeping? N  Some recent data might be hidden    Patient Care Team: [name], NP as PCP - General (Nurse Practitioner) Druscilla Brownie, MD as Consulting Physician (Dermatology)   Assessment:   This is a routine wellness examination for [facility].  Exercise Activities and Dietary recommendations Current Exercise Habits: The patient does not participate in regular exercise at present  Goals    . Quit smoking / using tobacco    . Reduce portion size     Starting 11/23/2016 I will reduce portion size and increase water intake to 3-4 cups of water a day.       Fall Risk Fall Risk  12/23/2018 09/02/2018 08/28/2018 04/24/2018 12/18/2017  Falls in the past year? 0 0 0 No Yes  Number falls in past yr: 0 0 0 - 1  Comment - - - - -  Injury with Fall? 0 0 0 - Yes  Comment - - - - torn ACL   Is the patient's home free of loose throw rugs in walkways, pet beds, electrical cords, etc?   no      Grab bars in the bathroom?  no      Handrails on the stairs? No      Adequate lighting?   yes  Depression Screen PHQ 2/9 Scores 12/23/2018 12/18/2017 11/23/2016 08/20/2016  PHQ - 2 Score 0 0 0 0    Cognitive Function MMSE - Mini Mental State Exam 12/23/2018 11/23/2016 02/14/2016 07/29/2014  Orientation to time 5 5 5 5   Orientation to Place 5 5 5 5   Registration 3 3 3 3   Attention/ Calculation 5 4 5 5   Recall 3 2 3 3   Language- name 2 objects 2 2 2 2   Language- repeat 1 1 1 1   Language- follow 3 step command 3 3 3 3   Language- read & follow direction 1 1 1 1   Write a sentence 1 1 0 1  Copy design 1 1 1 1   Total score 30 28 29 30         Immunization History  Administered Date(s) Administered  . Influenza, High Dose Seasonal PF 03/25/2017, 03/25/2018  . Influenza,inj,quad, With Preservative 03/31/2016  . Influenza-Unspecified 04/29/2014  . Pneumococcal Conjugate-13 02/14/2016  . Pneumococcal-Unspecified 04/29/2014  . Zoster Recombinat (Shingrix) 12/18/2017, 03/01/2018    Qualifies for Shingles Vaccine? Up to date   Screening Tests Health Maintenance  Topic Date Due  . COLON CANCER SCREENING ANNUAL FOBT  10/18/2021 (Originally 10/18/2016)  . INFLUENZA VACCINE  02/14/2019  . COLONOSCOPY  10/18/2020  . TETANUS/TDAP  07/17/2023  . PNA vac Low Risk Adult  Completed   Cancer Screenings: Lung: Low Dose CT Chest recommended if Age 20-80 years, 30 pack-year currently smoking OR have quit w/in 15years. Patient does qualify.Has upcoming appointment for CT scan in July,2020  Colorectal: Up to date   Additional Screenings:  Hepatitis C Screening: low risk       Plan:  - smoking cessation - Advance Directive will complete a bring  copy - Has upcoming appointment for chest CT scan in July 2020    I have personally reviewed and noted the following in the patient's chart:   . Medical and social history . Use of alcohol, tobacco or illicit drugs  . Current medications and supplements . Functional ability and status  . Nutritional status . Physical activity . Advanced directives . List of other physicians . Hospitalizations, surgeries, and ER visits in previous 12 months . Vitals . Screenings to include cognitive, depression, and falls . Referrals and appointments  In addition, I have reviewed and discussed with patient certain preventive protocols, quality metrics, and best practice recommendations. A written personalized care plan for preventive services as well as general preventive health recommendations were provided to patient.  Sandrea Hughs, NP  12/23/2018

## 2018-12-23 NOTE — Patient Instructions (Addendum)
[name] , Thank you for taking time to come for your Medicare Wellness Visit. I appreciate your ongoing commitment to your health goals. Please review the following plan we discussed and let me know if I can assist you in the future.   Screening recommendations/referrals: Colonoscopy: Up to date  Recommended yearly ophthalmology/optometry visit for glaucoma screening and checkup Recommended yearly dental visit for hygiene and checkup  Vaccinations: Influenza vaccine: Up to date  Pneumococcal vaccine: up to date  Tdap vaccine: Up to date due 07/17/2023 Shingles vaccine: Up to date    Advanced directives: No please provide copy when completed.  Conditions/risks identified: Advance age male > 50 yrs,male Gender,Obesity,Hypertension,sedentary lifestyle,dyslipidemia  Next appointment:1 yr   Preventive Care 76 Years and Older, Male Preventive care refers to lifestyle choices and visits with your health care provider that can promote health and wellness. What does preventive care include?  A yearly physical exam. This is also called an annual well check.  Dental exams once or twice a year.  Routine eye exams. Ask your health care provider how often you should have your eyes checked.  Personal lifestyle choices, including:  Daily care of your teeth and gums.  Regular physical activity.  Eating a healthy diet.  Avoiding tobacco and drug use.  Limiting alcohol use.  Practicing safe sex.  Taking low doses of aspirin every day.  Taking vitamin and mineral supplements as recommended by your health care provider. What happens during an annual well check? The services and screenings done by your health care provider during your annual well check will depend on your age, overall health, lifestyle risk factors, and family history of disease. Counseling  Your health care provider may ask you questions about your:  Alcohol use.  Tobacco use.  Drug use.  Emotional well-being.   Home and relationship well-being.  Sexual activity.  Eating habits.  History of falls.  Memory and ability to understand (cognition).  Work and work Statistician. Screening  You may have the following tests or measurements:  Height, weight, and BMI.  Blood pressure.  Lipid and cholesterol levels. These may be checked every 5 years, or more frequently if you are over 61 years old.  Skin check.  Lung cancer screening. You may have this screening every year starting at age 76 if you have a 30-pack-year history of smoking and currently smoke or have quit within the past 15 years.  Fecal occult blood test (FOBT) of the stool. You may have this test every year starting at age 76.  Flexible sigmoidoscopy or colonoscopy. You may have a sigmoidoscopy every 5 years or a colonoscopy every 10 years starting at age 76.  Prostate cancer screening. Recommendations will vary depending on your family history and other risks.  Hepatitis C blood test.  Hepatitis B blood test.  Sexually transmitted disease (STD) testing.  Diabetes screening. This is done by checking your blood sugar (glucose) after you have not eaten for a while (fasting). You may have this done every 1-3 years.  Abdominal aortic aneurysm (AAA) screening. You may need this if you are a current or former smoker.  Osteoporosis. You may be screened starting at age 76 if you are at high risk. Talk with your health care provider about your test results, treatment options, and if necessary, the need for more tests. Vaccines  Your health care provider may recommend certain vaccines, such as:  Influenza vaccine. This is recommended every year.  Tetanus, diphtheria, and acellular pertussis (Tdap, Td)  vaccine. You may need a Td booster every 10 years.  Zoster vaccine. You may need this after age 76.  Pneumococcal 13-valent conjugate (PCV13) vaccine. One dose is recommended after age 76.  Pneumococcal polysaccharide (PPSV23)  vaccine. One dose is recommended after age 48. Talk to your health care provider about which screenings and vaccines you need and how often you need them. This information is not intended to replace advice given to you by your health care provider. Make sure you discuss any questions you have with your health care provider. Document Released: 07/29/2015 Document Revised: 03/21/2016 Document Reviewed: 05/03/2015 Elsevier Interactive Patient Education  2017 [place] Prevention in the Home Falls can cause injuries. They can happen to people of all ages. There are many things you can do to make your home safe and to help prevent falls. What can I do on the outside of my home?  Regularly fix the edges of walkways and driveways and fix any cracks.  Remove anything that might make you trip as you walk through a door, such as a raised step or threshold.  Trim any bushes or trees on the path to your home.  Use bright outdoor lighting.  Clear any walking paths of anything that might make someone trip, such as rocks or tools.  Regularly check to see if handrails are loose or broken. Make sure that both sides of any steps have handrails.  Any raised decks and porches should have guardrails on the edges.  Have any leaves, snow, or ice cleared regularly.  Use sand or salt on walking paths during winter.  Clean up any spills in your garage right away. This includes oil or grease spills. What can I do in the bathroom?  Use night lights.  Install grab bars by the toilet and in the tub and shower. Do not use towel bars as grab bars.  Use non-skid mats or decals in the tub or shower.  If you need to sit down in the shower, use a plastic, non-slip stool.  Keep the floor dry. Clean up any water that spills on the floor as soon as it happens.  Remove soap buildup in the tub or shower regularly.  Attach bath mats securely with double-sided non-slip rug tape.  Do not have throw rugs  and other things on the floor that can make you trip. What can I do in the bedroom?  Use night lights.  Make sure that you have a light by your bed that is easy to reach.  Do not use any sheets or blankets that are too big for your bed. They should not hang down onto the floor.  Have a firm chair that has side arms. You can use this for support while you get dressed.  Do not have throw rugs and other things on the floor that can make you trip. What can I do in the kitchen?  Clean up any spills right away.  Avoid walking on wet floors.  Keep items that you use a lot in easy-to-reach places.  If you need to reach something above you, use a strong step stool that has a grab bar.  Keep electrical cords out of the way.  Do not use floor polish or wax that makes floors slippery. If you must use wax, use non-skid floor wax.  Do not have throw rugs and other things on the floor that can make you trip. What can I do with my stairs?  Do not leave  any items on the stairs.  Make sure that there are handrails on both sides of the stairs and use them. Fix handrails that are broken or loose. Make sure that handrails are as long as the stairways.  Check any carpeting to make sure that it is firmly attached to the stairs. Fix any carpet that is loose or worn.  Avoid having throw rugs at the top or bottom of the stairs. If you do have throw rugs, attach them to the floor with carpet tape.  Make sure that you have a light switch at the top of the stairs and the bottom of the stairs. If you do not have them, ask someone to add them for you. What else can I do to help prevent falls?  Wear shoes that:  Do not have high heels.  Have rubber bottoms.  Are comfortable and fit you well.  Are closed at the toe. Do not wear sandals.  If you use a stepladder:  Make sure that it is fully opened. Do not climb a closed stepladder.  Make sure that both sides of the stepladder are locked into  place.  Ask someone to hold it for you, if possible.  Clearly mark and make sure that you can see:  Any grab bars or handrails.  First and last steps.  Where the edge of each step is.  Use tools that help you move around (mobility aids) if they are needed. These include:  Canes.  Walkers.  Scooters.  Crutches.  Turn on the lights when you go into a dark area. Replace any light bulbs as soon as they burn out.  Set up your furniture so you have a clear path. Avoid moving your furniture around.  If any of your floors are uneven, fix them.  If there are any pets around you, be aware of where they are.  Review your medicines with your doctor. Some medicines can make you feel dizzy. This can increase your chance of falling. Ask your doctor what other things that you can do to help prevent falls. This information is not intended to replace advice given to you by your health care provider. Make sure you discuss any questions you have with your health care provider. Document Released: 04/28/2009 Document Revised: 12/08/2015 Document Reviewed: 08/06/2014 Elsevier Interactive Patient Education  2017 [employer].

## 2019-01-14 HISTORY — PX: SKIN SURGERY: SHX2413

## 2019-01-22 DIAGNOSIS — L820 Inflamed seborrheic keratosis: Secondary | ICD-10-CM | POA: Diagnosis not present

## 2019-01-22 DIAGNOSIS — C44722 Squamous cell carcinoma of skin of right lower limb, including hip: Secondary | ICD-10-CM | POA: Diagnosis not present

## 2019-02-03 DIAGNOSIS — C44722 Squamous cell carcinoma of skin of right lower limb, including hip: Secondary | ICD-10-CM | POA: Diagnosis not present

## 2019-02-04 ENCOUNTER — Encounter: Payer: Self-pay | Admitting: Cardiothoracic Surgery

## 2019-02-04 NOTE — Progress Notes (Signed)
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 PROCEDURE: Left lower lobectomy and lymph node dissection, video bronchoscopy, left video-assisted thoracoscopy, and placement of On-Q. 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  The lesion was a rapidly growing process from a negative she CT scan in January 2016.  Patient denies any specific symptoms of shortness of breath or hemoptysis.  He is back active including playing golf and riding his motorcycle a Honda gold wing.  Unfortunately he continues to smoke but is significantly cut back.  Patient notes that he has purposely tried to lose weight and over the past years gotten his weight down 24 pounds, as noted he continues to smoke.   He notes that earlier this week he had Mohs surgery on the lesion on his right calf, he was unclear of the details but noted it was a skin cancer that was completely resected.  Past Medical History:  Diagnosis Date  . A-fib ([place])   . Acute appendicitis with peritoneal abscess 01/28/2013  . Anxiety   . Arthritis   . Bacteremia September 2015  . Elevated PSA   . Essential hypertension 04/16/2014  . Heart murmur    childhood  . Ileus, postoperative ([place]) 01/28/2013   . Lung cancer ([place])   . Obesity, morbid ([place]) 01/28/2013  . Shortness of breath   . Tobacco use disorder 01/28/2013     Social History   Tobacco Use  Smoking Status Current Every Day Smoker  . Packs/day: 0.50  . Years: 60.00  . Pack years: 30.00  . Types: Cigarettes, Cigars  . Start date: 07/16/1958  Smokeless Tobacco Former Systems developer  . Types: Chew    Social History   Substance and Sexual Activity  Alcohol Use No  . Alcohol/week: 0.0 standard drinks     Allergies  Allergen Reactions  . Asa [Aspirin] Anaphylaxis and Swelling    Angioedema/Eyes and lip swelling  . Nsaids Hives  . Tolmetin Hives  . Naproxen Hives    Current Outpatient Medications  Medication Sig Dispense Refill  . hydrALAZINE (APRESOLINE) 25 MG tablet TAKE 1 TABLET BY MOUTH THREE TIMES DAILY FOR BLOOD PRESSURE 270 tablet 1  . losartan (COZAAR) 50 MG tablet TAKE 1 TABLET BY MOUTH EVERY DAY FOR BLOOD PRESSURE 90 tablet 1  . metoprolol tartrate (LOPRESSOR) 25 MG tablet TAKE 1/2 TABLET(12.5 MG) BY MOUTH TWICE DAILY 90 tablet 1  . Nutritional Supplements (ADULT NUTRITIONAL SUPPLEMENT + PO) Taking Total Restore 1 capsule by mouth three times daily patient states started 3 - 4 weeks ago     No current facility-administered medications for this visit.        Physical Exam: BP  140/66   Pulse 68   Temp 97.8 F (36.6 C) (Skin)   Resp 20   Ht 5\' 7"  (1.702 m)   Wt 206 lb (93.4 kg)   SpO2 92% Comment: RA  BMI 32.26 kg/m  General appearance: alert, cooperative, appears stated age and no distress Head: Normocephalic, without obvious abnormality, atraumatic Neck: no adenopathy, no carotid bruit, no JVD, supple, symmetrical, trachea midline and thyroid not enlarged, symmetric, no tenderness/mass/nodules Lymph nodes: Cervical, supraclavicular, and axillary nodes normal. Resp: clear to auscultation bilaterally Cardio: regular rate and rhythm, S1, S2 normal, no murmur, click, rub or gallop GI: soft, non-tender;  bowel sounds normal; no masses,  no organomegaly Extremities: Right lower leg wrapped in Ace wrap, from surgery several days ago Neurologic: Grossly normal  Diagnostic Studies & Laboratory data:     Recent Radiology Findings:  Ct Chest Wo Contrast  Result Date: 02/05/2019 CLINICAL DATA:  Lung cancer. Status post VATS with lymph node dissection. Status post left lower lobectomy. EXAM: CT CHEST WITHOUT CONTRAST TECHNIQUE: Multidetector CT imaging of the chest was performed following the standard protocol without IV contrast. COMPARISON:  01/09/2018 FINDINGS: Cardiovascular: The heart size is normal. No substantial pericardial effusion. Coronary artery calcification is evident. Atherosclerotic calcification is noted in the wall of the thoracic aorta. Prominence of the main pulmonary arteries raises the question of pulmonary arterial hypertension. Mediastinum/Nodes: Scattered small mediastinal lymph nodes are similar to prior. No mediastinal lymphadenopathy. No evidence for gross hilar lymphadenopathy although assessment is limited by the lack of intravenous contrast on today's study. The esophagus has normal imaging features. There is no axillary lymphadenopathy. Lungs/Pleura: Centrilobular emphsyema noted. Volume loss left hemithorax compatible with prior left lower lobectomy. Tiny 2 mm peripheral right upper lobe nodule (62/8) is unchanged. Stable scarring at the left base. No new or suspicious pulmonary nodule or mass. No pleural effusion. Upper Abdomen: Stable small cyst lateral segment left liver. Musculoskeletal: No worrisome lytic or sclerotic osseous abnormality.Stable appearance probable sebaceous cyst posterior right back. IMPRESSION: 1. Stable exam. Left lower lobectomy without evidence of recurrent or metastatic disease in the chest. 2. Prominence of the main pulmonary arteries suggests pulmonary arterial hypertension. 3.  Aortic Atherosclerois (ICD10-170.0) 4.  Emphysema. (WCH85-I77.9)  Electronically Signed   By: Misty Stanley M.D.   On: 02/05/2019 11:42     I have independently reviewed the above radiology studies  and reviewed the findings with the patient.  Ct Chest Wo Contrast  Result Date: 01/09/2018 CLINICAL DATA:  Status post left lower lobectomy 04/26/2015. wedge resection for non-small-cell lung cancer. Current smoker. Occasional shortness of breath. EXAM: CT CHEST WITHOUT CONTRAST TECHNIQUE: Multidetector CT imaging of the chest was performed following the standard protocol without IV contrast. COMPARISON:  04/18/2017 FINDINGS: Cardiovascular: Aortic and branch vessel atherosclerosis. Tortuous thoracic aorta. Normal heart size, without pericardial effusion. Multivessel coronary artery atherosclerosis. Pulmonary artery enlargement, outflow tract 3.3 cm. Mediastinum/Nodes: No supraclavicular adenopathy. No mediastinal or definite hilar adenopathy, given limitations of unenhanced CT. Lungs/Pleura: Left-sided pleural thickening and trace inferior left pleural fluid are similar. Status post left lower lobectomy. Moderate bullous type emphysema. Secondary left lung base volume loss and scarring. A likely calcified lateral left lung base 2 mm nodule on image 87/8 is not significantly changed. Upper Abdomen: Left hepatic lobe cyst. Normal imaged portions of the spleen, stomach, pancreas, adrenal glands, left kidney. Abdominal aortic atherosclerosis. Musculoskeletal: Probable sebaceous cysts about the right posterior chest wall. The largest lesion has likely undergone interval draining, including on image  72/2. moderate thoracic spondylosis with accentuation of expected thoracic kyphosis. IMPRESSION: 1. Left lower lobectomy, without findings of recurrent or metastatic disease. 2. Aortic atherosclerosis (ICD10-I70.0), coronary artery atherosclerosis and emphysema (ICD10-J43.9). 3. Pulmonary artery enlargement suggests pulmonary arterial hypertension. Electronically Signed   By: Abigail Miyamoto  M.D.   On: 01/09/2018 13:59   Ct Chest Wo Contrast  Result Date: 04/18/2017 CLINICAL DATA:  Restaging small cell lung cancer. EXAM: CT CHEST WITHOUT CONTRAST TECHNIQUE: Multidetector CT imaging of the chest was performed following the standard protocol without IV contrast. COMPARISON:  CT scan 09/13/2016 FINDINGS: Cardiovascular: The heart is normal in size. No pericardial effusion. Mild tortuosity and calcification of the thoracic aorta is stable. Stable coronary artery calcifications. Mediastinum/Nodes: No mediastinal or hilar mass or lymphadenopathy. Small scattered lymph nodes are stable. The esophagus is grossly normal. Lungs/Pleura: Stable surgical changes from a left lower lobe lobectomy. No findings suspicious for recurrent tumor. No metastatic pulmonary nodules or acute pulmonary findings. Chronic left basilar pleural effusion with overlying scarring changes. Upper Abdomen: No significant upper abdominal findings. Stable hepatic cysts. No adrenal gland lesions. Musculoskeletal: Stable subcutaneous nodules involving the posterior chest wall. No supraclavicular or axillary adenopathy. No significant bony findings. IMPRESSION: 1. Stable CT appearance of the chest. Postsurgical changes noted from a left lower lobe lobectomy without CT findings for recurrent tumor, mediastinal/hilar adenopathy or metastatic pulmonary nodules. 2. No findings to suggest metastatic disease involving upper abdomen or bony structures. 3. Stable atherosclerotic calcifications involving the aorta and coronary arteries. Aortic Atherosclerosis (ICD10-I70.0) and Emphysema (ICD10-J43.9). Electronically Signed   By: Marijo Sanes M.D.   On: 04/18/2017 16:20   Ct Chest Wo Contrast  Result Date: 09/13/2016 CLINICAL DATA:  Status post left lower lobectomy 04/26/2015 for sarcomatoid lung carcinoma, presenting for restaging. Current smoker. EXAM: CT CHEST WITHOUT CONTRAST TECHNIQUE: Multidetector CT imaging of the chest was performed  following the standard protocol without IV contrast. COMPARISON:  02/23/2016 chest CT. FINDINGS: Cardiovascular: Normal heart size. No significant pericardial fluid/thickening. Left anterior descending, left circumflex and right coronary atherosclerosis. Atherosclerotic nonaneurysmal thoracic aorta. Stable top-normal caliber main pulmonary artery (3.1 cm diameter). Mediastinum/Nodes: No discrete thyroid nodules. Unremarkable esophagus. No pathologically enlarged axillary, mediastinal or gross hilar lymph nodes, noting limited sensitivity for the detection of hilar adenopathy on this noncontrast study. Lungs/Pleura: No pneumothorax. No right pleural effusion. Status post left lower lobectomy. Tiny loculated dependent basilar left pleural effusion is decreased. Tiny peripheral right upper lobe 3 mm pulmonary nodule (series 4/ image 38) is stable back to 09/26/2015 and considered benign. Stable small parenchymal bands at the left lung base compatible with mild postinfectious scarring. Stable 2 mm calcified granuloma in the peripheral left upper lobe. No acute consolidative airspace disease, lung masses or new significant pulmonary nodules. Upper abdomen: Simple 1.9 cm left liver lobe cyst. Musculoskeletal: No aggressive appearing focal osseous lesions. Moderate thoracic spondylosis. No appreciable change and circumscribed superficial subcutaneous nodules in the bilateral back of variable density, largest 4.0 cm in the right back, most compatible with sebaceous cysts. IMPRESSION: 1. No evidence of local tumor recurrence in the left lung status post left lower lobectomy. 2. No evidence of metastatic disease in the chest. 3. Continued reduction in size of tiny loculated basilar left pleural effusion. 4. Aortic atherosclerosis.  Three-vessel coronary atherosclerosis. Electronically Signed   By: Ilona Sorrel M.D.   On: 09/13/2016 15:30   Ct Chest Wo Contrast  Result Date: 02/23/2016 CLINICAL DATA:  History of lung  cancer status post  left lower lobe wedge resection 04/26/2015. EXAM: CT CHEST WITHOUT CONTRAST TECHNIQUE: Multidetector CT imaging of the chest was performed following the standard protocol without IV contrast. COMPARISON:  Chest CT 09/26/2015 and PET CT 03/07/2015. FINDINGS: Cardiovascular: Atherosclerosis of aorta, great vessels and coronary arteries again noted. No acute vascular findings are demonstrated on noncontrast imaging. The heart size is normal. There is no pericardial effusion. Mediastinum/Nodes: Small mediastinal lymph nodes are unchanged. Hilar assessment is limited by the lack of intravenous contrast, although the hilar contours appear unchanged. The thyroid gland, trachea and esophagus demonstrate no significant findings. Lungs/Pleura: Small left pleural effusion has decreased in volume. There is no significant pleural fluid on the right. Stable postsurgical changes status post left lower lobe resection. There is a stable calcified lingular granuloma. There is a stable small perifissural nodule along the minor fissure on image 63. There is a stable tiny right upper lobe nodule on image 34. No suspicious pulmonary nodules. Upper abdomen: The visualized upper abdomen appears stable without suspicious findings. Low-density hepatic lesion is unchanged, likely a cyst. Musculoskeletal/Chest wall: There is no chest wall mass or suspicious osseous finding. Thoracotomy defects noted on the left. Several probable epidermal inclusion (sebaceous) cysts again noted posteriorly in the right chest wall. IMPRESSION: 1. Interval improvement in small loculated left pleural effusion following left lower lobe resection. 2. No evidence of local recurrence or metastatic disease. 3. Atherosclerosis, as before. Electronically Signed   By: Richardean Sale M.D.   On: 02/23/2016 11:52   I have independently reviewed the above radiology studies  and reviewed the findings with the patient.     Ct Chest Wo Contrast   09/26/2015  CLINICAL DATA:  Left lower lobe lung cancer and resection. Weight loss. EXAM: CT CHEST WITHOUT CONTRAST TECHNIQUE: Multidetector CT imaging of the chest was performed following the standard protocol without IV contrast. COMPARISON:  PET 03/07/2015 and CT chest 08/05/2014. FINDINGS: Mediastinum/Nodes: Mediastinal lymph nodes are not enlarged by CT size criteria. Hilar regions are difficult to definitively evaluate without IV contrast. No axillary adenopathy. Atherosclerotic calcification of the arterial vasculature, including three-vessel involvement of the coronary arteries. Heart size normal. No pericardial effusion. Lungs/Pleura: Postoperative changes of left lower lobectomy with a small left pleural effusion. Minimal linear volume loss in the left upper lobe. Calcified granuloma in the lingula. 4 mm right upper lobe nodule (4/19), unchanged. Airway is otherwise unremarkable. Upper abdomen: Low-attenuation lesion in the dome of the liver measures 1.8 cm, as before. Visualized portions of the liver, gallbladder, adrenal glands, kidneys, spleen, pancreas, stomach and bowel are otherwise grossly unremarkable. No upper abdominal adenopathy. Musculoskeletal: No worrisome lytic or sclerotic lesions. Degenerative changes are seen in the spine. Thoracotomy changes on the left. Probable subcutaneous sebaceous cysts overlying the right scapula and posterior right ribs. IMPRESSION: 1. Postoperative changes of left lower lobectomy without evidence of recurrent or metastatic disease. 2. Small left pleural effusion. 3. Three-vessel coronary artery calcification. Electronically Signed   By: Lorin Picket M.D.   On: 09/26/2015 14:18      Recent Lab Findings: Lab Results  Component Value Date   WBC 7.2 08/26/2018   HGB 17.7 (H) 08/26/2018   HCT 50.8 (H) 08/26/2018   PLT 221 08/26/2018   GLUCOSE 98 08/26/2018   CHOL 155 04/17/2018   TRIG 199 (H) 04/17/2018   HDL 34 (L) 04/17/2018   LDLCALC 91 04/17/2018    ALT 12 04/17/2018   AST 17 04/17/2018   NA 141 08/26/2018   K  4.5 08/26/2018   CL 105 08/26/2018   CREATININE 0.82 08/26/2018   BUN 19 08/26/2018   CO2 31 08/26/2018   TSH 1.01 02/08/2016   INR 1.00 04/20/2015   HGBA1C 5.2 08/26/2018   Cancer Staging Lung cancer, lower lobe ([place]) Staging form: Lung, AJCC 6th Edition - Pathologic stage from 04/25/2015: Stage IA (T1, N0, M0) - Signed by Grace Isaac, MD on 04/28/2015     Assessment / Plan:   Patient now 4 years after resection of her sarcomatous rapidly growing carcinoma of the left lower lobe, stage Ia, repeat scan shows no evidence of recurrence  We will see him back with a follow-up CT of the chest in 1 year  Grace Isaac MD      [address] [place] [address] Office [phone]   Beeper [phone]  02/05/2019 12:11 PM

## 2019-02-05 ENCOUNTER — Ambulatory Visit
Admission: RE | Admit: 2019-02-05 | Discharge: 2019-02-05 | Disposition: A | Discharge status: Home / Self Care | Payer: PPO | Source: Ambulatory Visit | Attending: Cardiothoracic Surgery | Admitting: Cardiothoracic Surgery

## 2019-02-05 ENCOUNTER — Ambulatory Visit: Payer: PPO | Admitting: Cardiothoracic Surgery

## 2019-02-05 ENCOUNTER — Other Ambulatory Visit: Payer: Self-pay

## 2019-02-05 VITALS — BP 140/66 | HR 68 | Temp 97.8°F | Resp 20 | Ht 67.0 in | Wt 206.0 lb

## 2019-02-05 DIAGNOSIS — Z902 Acquired absence of lung [part of]: Secondary | ICD-10-CM | POA: Diagnosis not present

## 2019-02-05 DIAGNOSIS — Z85118 Personal history of other malignant neoplasm of bronchus and lung: Secondary | ICD-10-CM | POA: Diagnosis not present

## 2019-02-05 DIAGNOSIS — C3490 Malignant neoplasm of unspecified part of unspecified bronchus or lung: Secondary | ICD-10-CM | POA: Diagnosis not present

## 2019-02-05 DIAGNOSIS — C3430 Malignant neoplasm of lower lobe, unspecified bronchus or lung: Secondary | ICD-10-CM

## 2019-02-26 ENCOUNTER — Other Ambulatory Visit: Payer: Self-pay

## 2019-02-26 ENCOUNTER — Other Ambulatory Visit: Payer: PPO

## 2019-02-26 DIAGNOSIS — I10 Essential (primary) hypertension: Secondary | ICD-10-CM

## 2019-02-26 DIAGNOSIS — E782 Mixed hyperlipidemia: Secondary | ICD-10-CM

## 2019-02-26 DIAGNOSIS — R739 Hyperglycemia, unspecified: Secondary | ICD-10-CM

## 2019-02-27 LAB — CBC WITH DIFFERENTIAL/PLATELET
Absolute Monocytes: 685 cells/uL (ref 200–950)
Basophils Absolute: 31 cells/uL (ref 0–200)
Basophils Relative: 0.4 %
Eosinophils Absolute: 100 cells/uL (ref 15–500)
Eosinophils Relative: 1.3 %
HCT: 50.6 % — ABNORMAL HIGH (ref 38.5–50.0)
Hemoglobin: 17.1 g/dL (ref 13.2–17.1)
Lymphs Abs: 1663 cells/uL (ref 850–3900)
MCH: 32.5 pg (ref 27.0–33.0)
MCHC: 33.8 g/dL (ref 32.0–36.0)
MCV: 96.2 fL (ref 80.0–100.0)
MPV: 10.8 fL (ref 7.5–12.5)
Monocytes Relative: 8.9 %
Neutro Abs: 5221 cells/uL (ref 1500–7800)
Neutrophils Relative %: 67.8 %
Platelets: 235 10*3/uL (ref 140–400)
RBC: 5.26 10*6/uL (ref 4.20–5.80)
RDW: 13.2 % (ref 11.0–15.0)
Total Lymphocyte: 21.6 %
WBC: 7.7 10*3/uL (ref 3.8–10.8)

## 2019-02-27 LAB — LIPID PANEL
Cholesterol: 156 mg/dL (ref <200)
HDL: 39 mg/dL — ABNORMAL LOW (ref >=40)
LDL Cholesterol (Calc): 91 mg/dL (calc)
Non-HDL Cholesterol (Calc): 117 mg/dL (calc) (ref <130)
Total CHOL/HDL Ratio: 4 (calc) (ref <5.0)
Triglycerides: 154 mg/dL — ABNORMAL HIGH (ref <150)

## 2019-02-27 LAB — COMPLETE METABOLIC PANEL WITH GFR
AG Ratio: 1.5 (calc) (ref 1.0–2.5)
ALT: 16 U/L (ref 9–46)
AST: 21 U/L (ref 10–35)
Albumin: 4.3 g/dL (ref 3.6–5.1)
Alkaline phosphatase (APISO): 64 U/L (ref 35–144)
BUN: 21 mg/dL (ref 7–25)
CO2: 29 mmol/L (ref 20–32)
Calcium: 9.4 mg/dL (ref 8.6–10.3)
Chloride: 107 mmol/L (ref 98–110)
Creat: 0.86 mg/dL (ref 0.70–1.18)
GFR, Est African American: 98 mL/min/{1.73_m2} (ref >=60)
GFR, Est Non African American: 85 mL/min/{1.73_m2} (ref >=60)
Globulin: 2.8 g/dL (calc) (ref 1.9–3.7)
Glucose, Bld: 95 mg/dL (ref 65–99)
Potassium: 4.9 mmol/L (ref 3.5–5.3)
Sodium: 142 mmol/L (ref 135–146)
Total Bilirubin: 0.6 mg/dL (ref 0.2–1.2)
Total Protein: 7.1 g/dL (ref 6.1–8.1)

## 2019-02-27 LAB — HEMOGLOBIN A1C
Hgb A1c MFr Bld: 5.2 % of total Hgb (ref <5.7)
Mean Plasma Glucose: 103 (calc)
eAG (mmol/L): 5.7 (calc)

## 2019-03-02 ENCOUNTER — Encounter: Payer: Self-pay | Admitting: Nurse Practitioner

## 2019-03-02 ENCOUNTER — Other Ambulatory Visit: Payer: Self-pay

## 2019-03-02 ENCOUNTER — Ambulatory Visit (INDEPENDENT_AMBULATORY_CARE_PROVIDER_SITE_OTHER): Payer: PPO | Admitting: Nurse Practitioner

## 2019-03-02 VITALS — BP 118/64 | HR 62 | Temp 98.1°F | Ht 67.0 in | Wt 210.0 lb

## 2019-03-02 DIAGNOSIS — Z720 Tobacco use: Secondary | ICD-10-CM | POA: Diagnosis not present

## 2019-03-02 DIAGNOSIS — R739 Hyperglycemia, unspecified: Secondary | ICD-10-CM

## 2019-03-02 DIAGNOSIS — Z902 Acquired absence of lung [part of]: Secondary | ICD-10-CM | POA: Diagnosis not present

## 2019-03-02 DIAGNOSIS — I10 Essential (primary) hypertension: Secondary | ICD-10-CM | POA: Diagnosis not present

## 2019-03-02 DIAGNOSIS — E782 Mixed hyperlipidemia: Secondary | ICD-10-CM | POA: Diagnosis not present

## 2019-03-02 DIAGNOSIS — R972 Elevated prostate specific antigen [PSA]: Secondary | ICD-10-CM | POA: Diagnosis not present

## 2019-03-02 NOTE — Progress Notes (Signed)
Careteam: Patient Care Team: [name], NP as PCP - General (Nurse Practitioner) Druscilla Brownie, MD as Consulting Physician (Dermatology)  Advanced Directive information    Allergies  Allergen Reactions  . Asa [Aspirin] Anaphylaxis and Swelling    Angioedema/Eyes and lip swelling  . Nsaids Hives  . Tolmetin Hives  . Naproxen Hives    Chief Complaint  Patient presents with  . Medical Management of Chronic Issues    6 month follow-up, discuss labs (copy printed)   . Immunizations    Flu vaccine out of stock      HPI: Patient is a 78 y.o. male seen in the office today for routine follow up.   Elevated PSA- stable, no changes in urination. asymptomatic during the day, gets up at night 1-2 times which has been going on for 20 years +, PSA as high as  6.4 in 2016 then down to 3.3 2018, currently 4.2 does not wish to go to urology for biopsy or followup.   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 No signs of recurrence.   HTN- controlled on hydralazne, losartan and metoprolol  Hyperlipidemia- has made dietary modification. Eating better and trying to lose weight, down from 223 to 210 since february.  Triglycerides elevated at 154.  Hyperglycemia- controlled at  A1c 5.2.    Still smoking- no plans to quit, has cut back but not wanting to go down further.   Dermatology following due to abnormal lesion now following with [facility]. 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  HENT: Negative for tinnitus.   Respiratory: Positive for cough (chronic, no changes, worse in the morning.). Negative for sputum production and shortness of breath.   Cardiovascular: Negative for chest pain, palpitations and leg swelling.  Gastrointestinal: Negative for abdominal pain, constipation, diarrhea and heartburn.   Genitourinary: Negative for dysuria, frequency and urgency.  Musculoskeletal: Positive for back pain. Negative for falls, joint pain and myalgias.  Skin: Negative.   Neurological: Negative for dizziness and headaches.  Psychiatric/Behavioral: Negative for depression and memory loss. The patient does not have insomnia.     Past Medical History:  Diagnosis Date  . A-fib ([place])   . Acute appendicitis with peritoneal abscess 01/28/2013  . Anxiety   . Arthritis   . Bacteremia September 2015  . Elevated PSA   . Essential hypertension 04/16/2014  . Heart murmur    childhood  . Ileus, postoperative ([place]) 01/28/2013  . Lung cancer ([place])   . Obesity, morbid ([place]) 01/28/2013  . Shortness of breath   . Tobacco use disorder 01/28/2013   Past Surgical History:  Procedure Laterality Date  . APPENDECTOMY    . [facility], 2003   Dr.Apleton   . CARPAL TUNNEL RELEASE    . COLONOSCOPY     x 3  . EYE SURGERY Left    muscle of the eye   . KNEE SURGERY Bilateral   . LAPAROSCOPIC APPENDECTOMY N/A 01/25/2013   Procedure: APPENDECTOMY LAPAROSCOPIC;  Surgeon: Ralene Ok, MD;  Location: [place];  Service: General;  Laterality: N/A;  . LYMPH NODE DISSECTION Left 04/26/2015   Procedure: LYMPH NODE DISSECTION;  Surgeon: Grace Isaac, MD;  Location: [place];  Service: Thoracic;  Laterality: Left;  . SHOULDER SURGERY Right 2014   Dr.Whitfield rotator cuff surgery  . SKIN SURGERY  Right 01/2019   Right Leg, cancerous area removed  . TEE WITHOUT CARDIOVERSION N/A 04/07/2014   Procedure: TRANSESOPHAGEAL ECHOCARDIOGRAM (TEE);  Surgeon: Candee Furbish, MD;  Location: [facility] ENDOSCOPY;  Service: Cardiovascular;  Laterality: N/A;  . VIDEO ASSISTED THORACOSCOPY (VATS)/WEDGE RESECTION Left 04/26/2015   Procedure: LEFT VIDEO ASSISTED THORACOSCOPY (VATS) WITH LEFT LOWER LOBECTOMY;  Surgeon: Grace Isaac, MD;  Location: [place];  Service: Thoracic;  Laterality: Left;  Marland Kitchen VIDEO BRONCHOSCOPY N/A 04/26/2015    Procedure: VIDEO BRONCHOSCOPY;  Surgeon: Grace Isaac, MD;  Location: [place];  Service: Thoracic;  Laterality: N/A;   Social History:   reports that he has been smoking cigarettes and cigars. He started smoking about 60 years ago. He has a 30.00 pack-year smoking history. He quit smokeless tobacco use about 15 years ago.  His smokeless tobacco use included chew. He reports that he does not drink alcohol or use drugs.  Family History  Problem Relation Age of Onset  . Irritable bowel syndrome Mother   . Cancer Neg Hx   . Lung disease Neg Hx   . Colon cancer Neg Hx   . Colon polyps Neg Hx     Medications: Patient's Medications  New Prescriptions   No medications on file  Previous Medications   HYDRALAZINE (APRESOLINE) 25 MG TABLET    TAKE 1 TABLET BY MOUTH THREE TIMES DAILY FOR BLOOD PRESSURE   LOSARTAN (COZAAR) 50 MG TABLET    TAKE 1 TABLET BY MOUTH EVERY DAY FOR BLOOD PRESSURE   METOPROLOL TARTRATE (LOPRESSOR) 25 MG TABLET    TAKE 1/2 TABLET(12.5 MG) BY MOUTH TWICE DAILY   NUTRITIONAL SUPPLEMENTS (ADULT NUTRITIONAL SUPPLEMENT + PO)    Taking Total Restore 1 capsule by mouth three times daily patient states started 3 - 4 weeks ago  Modified Medications   No medications on file  Discontinued Medications   No medications on file    Physical Exam:  Vitals:   03/02/19 0831  BP: 118/64  Pulse: 62  Temp: 98.1 F (36.7 C)  TempSrc: Oral  SpO2: 95%  Weight: 210 lb (95.3 kg)  Height: 5\' 7"  (1.702 m)   Body mass index is 32.89 kg/m. Wt Readings from Last 3 Encounters:  03/02/19 210 lb (95.3 kg)  02/05/19 206 lb (93.4 kg)  12/23/18 211 lb 3.2 oz (95.8 kg)    Physical Exam Constitutional:      General: He is not in acute distress.    Appearance: He is well-developed. He is not diaphoretic.  HENT:     Head: Normocephalic and atraumatic.     Right Ear: Tympanic membrane and ear canal normal. There is no impacted cerumen.     Left Ear: Tympanic membrane and ear canal  normal. There is no impacted cerumen.     Mouth/Throat:     Pharynx: No oropharyngeal exudate.  Eyes:     Conjunctiva/sclera: Conjunctivae normal.     Pupils: Pupils are equal, round, and reactive to light.  Neck:     Musculoskeletal: Normal range of motion and neck supple.  Cardiovascular:     Rate and Rhythm: Normal rate and regular rhythm.     Heart sounds: Normal heart sounds.  Pulmonary:     Effort: Pulmonary effort is normal.  Abdominal:     General: Bowel sounds are normal.     Palpations: Abdomen is soft.  Musculoskeletal:        General: No tenderness.  Skin:    General: Skin is warm and  dry.  Neurological:     Mental Status: He is alert and oriented to person, place, and time.     Labs reviewed: Basic Metabolic Panel: Recent Labs    04/17/18 0820 08/26/18 0809 02/26/19 0803  NA 139 141 142  K 4.2 4.5 4.9  CL 105 105 107  CO2 24 31 29   GLUCOSE 103* 98 95  BUN 15 19 21   CREATININE 0.85 0.82 0.86  CALCIUM 9.1 9.3 9.4   Liver Function Tests: Recent Labs    04/17/18 0820 02/26/19 0803  AST 17 21  ALT 12 16  BILITOT 0.6 0.6  PROT 6.7 7.1   No results for input(s): LIPASE, AMYLASE in the last 8760 hours. No results for input(s): AMMONIA in the last 8760 hours. CBC: Recent Labs    08/26/18 0809 02/26/19 0803  WBC 7.2 7.7  NEUTROABS 5,119 5,221  HGB 17.7* 17.1  HCT 50.8* 50.6*  MCV 94.2 96.2  PLT 221 235   Lipid Panel: Recent Labs    04/17/18 0820 02/26/19 0803  CHOL 155 156  HDL 34* 39*  LDLCALC 91 91  TRIG 199* 154*  CHOLHDL 4.6 4.0   TSH: No results for input(s): TSH in the last 8760 hours. A1C: Lab Results  Component Value Date   HGBA1C 5.2 02/26/2019     Assessment/Plan 1. Elevated PSA -continue to monitor, no changes in urinary flow, stream or frequency - PSA; Future  2. Essential hypertension -stable on current regimen. Continue dietary modifications. - BASIC METABOLIC PANEL WITH GFR; Future  3. Mixed hyperlipidemia  -continue on dietary modifications. - Lipid Panel; Future  4. Hyperglycemia -encouraged to continue dietary modifications.   5. Tobacco abuse -does not wish to quit smoking but has cut back. Encouraged cessation esp at risk due to hx of lung cancer.   6. S/P lobectomy of lung Due to hx of lung cancer. Following with Dr Richardine Service yearly.    Next appt: 6 months with labs prior  Lovie Zarling K. [place], [facility] [phone]

## 2019-03-02 NOTE — Patient Instructions (Addendum)
Continue to work on heart healthy diet   Follow up in 49months with labs before visit   Heart-Healthy Eating Plan Many factors influence your heart (coronary) health, including eating and exercise habits. Coronary risk increases with abnormal blood fat (lipid) levels. Heart-healthy meal planning includes limiting unhealthy fats, increasing healthy fats, and making other diet and lifestyle changes.  What are tips for following this plan? Cooking Cook foods using methods other than frying. Baking, boiling, grilling, and broiling are all good options. Other ways to reduce fat include:  Removing the skin from poultry.  Removing all visible fats from meats.  Steaming vegetables in water or broth. Meal planning   At meals, imagine dividing your plate into fourths: ? Fill one-half of your plate with vegetables and green salads. ? Fill one-fourth of your plate with whole grains. ? Fill one-fourth of your plate with lean protein foods.  Eat 4-5 servings of vegetables per day. One serving equals 1 cup raw or cooked vegetable, or 2 cups raw leafy greens.  Eat 4-5 servings of fruit per day. One serving equals 1 medium whole fruit,  cup dried fruit,  cup fresh, frozen, or canned fruit, or  cup 100% fruit juice.  Eat more foods that contain soluble fiber. Examples include apples, broccoli, carrots, beans, peas, and barley. Aim to get 25-30 g of fiber per day.  Increase your consumption of legumes, nuts, and seeds to 4-5 servings per week. One serving of dried beans or legumes equals  cup cooked, 1 serving of nuts is  cup, and 1 serving of seeds equals 1 tablespoon. Fats  Choose healthy fats more often. Choose monounsaturated and polyunsaturated fats, such as olive and canola oils, flaxseeds, walnuts, almonds, and seeds.  Eat more omega-3 fats. Choose salmon, mackerel, sardines, tuna, flaxseed oil, and ground flaxseeds. Aim to eat fish at least 2 times each week.  Check food labels  carefully to identify foods with trans fats or high amounts of saturated fat.  Limit saturated fats. These are found in animal products, such as meats, butter, and cream. Plant sources of saturated fats include palm oil, palm kernel oil, and coconut oil.  Avoid foods with partially hydrogenated oils in them. These contain trans fats. Examples are stick margarine, some tub margarines, cookies, crackers, and other baked goods.  Avoid fried foods. General information  Eat more home-cooked food and less restaurant, buffet, and fast food.  Limit or avoid alcohol.  Limit foods that are high in starch and sugar.  Lose weight if you are overweight. Losing just 5-10% of your body weight can help your overall health and prevent diseases such as diabetes and heart disease.  Monitor your salt (sodium) intake, especially if you have high blood pressure. Talk with your health care provider about your sodium intake.  Try to incorporate more vegetarian meals weekly. What foods can I eat? Fruits All fresh, canned (in natural juice), or frozen fruits. Vegetables Fresh or frozen vegetables (raw, steamed, roasted, or grilled). Green salads. Grains Most grains. Choose whole wheat and whole grains most of the time. Rice and pasta, including brown rice and pastas made with whole wheat. Meats and other proteins Lean, well-trimmed beef, veal, pork, and lamb. Chicken and [place] without skin. All fish and shellfish. Wild duck, rabbit, pheasant, and venison. Egg whites or low-cholesterol egg substitutes. Dried beans, peas, lentils, and tofu. Seeds and most nuts. Dairy Low-fat or nonfat cheeses, including ricotta and mozzarella. Skim or 1% milk (liquid, powdered, or evaporated). Buttermilk  made with low-fat milk. Nonfat or low-fat yogurt. Fats and oils Non-hydrogenated (trans-free) margarines. Vegetable oils, including soybean, sesame, sunflower, olive, peanut, safflower, corn, canola, and cottonseed. Salad  dressings or mayonnaise made with a vegetable oil. Beverages Water (mineral or sparkling). Coffee and tea. Diet carbonated beverages. Sweets and desserts Sherbet, gelatin, and fruit ice. Small amounts of dark chocolate. Limit all sweets and desserts. Seasonings and condiments All seasonings and condiments. The items listed above may not be a complete list of foods and beverages you can eat. Contact a dietitian for more options. What foods are not recommended? Fruits Canned fruit in heavy syrup. Fruit in cream or butter sauce. Fried fruit. Limit coconut. Vegetables Vegetables cooked in cheese, cream, or butter sauce. Fried vegetables. Grains Breads made with saturated or trans fats, oils, or whole milk. Croissants. Sweet rolls. Donuts. High-fat crackers, such as cheese crackers. Meats and other proteins Fatty meats, such as hot dogs, ribs, sausage, bacon, rib-eye roast or steak. High-fat deli meats, such as salami and bologna. Caviar. Domestic duck and goose. Organ meats, such as liver. Dairy Cream, sour cream, cream cheese, and creamed cottage cheese. Whole milk cheeses. Whole or 2% milk (liquid, evaporated, or condensed). Whole buttermilk. Cream sauce or high-fat cheese sauce. Whole-milk yogurt. Fats and oils Meat fat, or shortening. Cocoa butter, hydrogenated oils, palm oil, coconut oil, palm kernel oil. Solid fats and shortenings, including bacon fat, salt pork, lard, and butter. Nondairy cream substitutes. Salad dressings with cheese or sour cream. Beverages Regular sodas and any drinks with added sugar. Sweets and desserts Frosting. Pudding. Cookies. Cakes. Pies. Milk chocolate or white chocolate. Buttered syrups. Full-fat ice cream or ice cream drinks. The items listed above may not be a complete list of foods and beverages to avoid. Contact a dietitian for more information. Summary  Heart-healthy meal planning includes limiting unhealthy fats, increasing healthy fats, and making  other diet and lifestyle changes.  Lose weight if you are overweight. Losing just 5-10% of your body weight can help your overall health and prevent diseases such as diabetes and heart disease.  Focus on eating a balance of foods, including fruits and vegetables, low-fat or nonfat dairy, lean protein, nuts and legumes, whole grains, and heart-healthy oils and fats. This information is not intended to replace advice given to you by your health care provider. Make sure you discuss any questions you have with your health care provider. Document Released: 04/10/2008 Document Revised: 08/09/2017 Document Reviewed: 08/09/2017 Elsevier Patient Education  2020 [employer].

## 2019-03-27 ENCOUNTER — Telehealth: Payer: Self-pay | Admitting: *Deleted

## 2019-03-27 ENCOUNTER — Other Ambulatory Visit: Payer: Self-pay | Admitting: Nurse Practitioner

## 2019-03-27 NOTE — Telephone Encounter (Signed)
Patient dropped off Handicap Placard to be filled out.  Placed in [place] folder to review and sign. Call patient once ready for pick up.

## 2019-03-31 ENCOUNTER — Other Ambulatory Visit: Payer: Self-pay | Admitting: Nurse Practitioner

## 2019-04-03 NOTE — Telephone Encounter (Signed)
Handicap Placard completed.  Patient notified and mailed to patient.  Copy sent for scanning.

## 2019-05-27 ENCOUNTER — Other Ambulatory Visit: Payer: Self-pay | Admitting: Nurse Practitioner

## 2019-06-27 ENCOUNTER — Other Ambulatory Visit: Payer: Self-pay

## 2019-06-27 DIAGNOSIS — Z20822 Contact with and (suspected) exposure to covid-19: Secondary | ICD-10-CM

## 2019-06-28 LAB — NOVEL CORONAVIRUS, NAA: SARS-CoV-2, NAA: NOT DETECTED

## 2019-08-05 ENCOUNTER — Telehealth: Payer: Self-pay

## 2019-08-05 NOTE — Telephone Encounter (Signed)
Patient called and notified.

## 2019-08-05 NOTE — Telephone Encounter (Signed)
Yes recommend that he get this vaccine

## 2019-08-05 NOTE — Telephone Encounter (Signed)
Patient states that he's scheduled to receive Covid Vaccine 08/07/2019. Patient states that he was told to check with his primary care to make sure that it's ok for him to receive vaccine. Please Advise.

## 2019-08-28 ENCOUNTER — Other Ambulatory Visit: Payer: Self-pay

## 2019-08-28 ENCOUNTER — Other Ambulatory Visit: Payer: PPO

## 2019-08-28 DIAGNOSIS — R972 Elevated prostate specific antigen [PSA]: Secondary | ICD-10-CM | POA: Diagnosis not present

## 2019-08-28 DIAGNOSIS — I10 Essential (primary) hypertension: Secondary | ICD-10-CM

## 2019-08-28 DIAGNOSIS — E782 Mixed hyperlipidemia: Secondary | ICD-10-CM | POA: Diagnosis not present

## 2019-08-29 LAB — BASIC METABOLIC PANEL WITH GFR
BUN: 20 mg/dL (ref 7–25)
CO2: 27 mmol/L (ref 20–32)
Calcium: 9.4 mg/dL (ref 8.6–10.3)
Chloride: 105 mmol/L (ref 98–110)
Creat: 0.89 mg/dL (ref 0.70–1.18)
GFR, Est African American: 96 mL/min/{1.73_m2} (ref >=60)
GFR, Est Non African American: 83 mL/min/{1.73_m2} (ref >=60)
Glucose, Bld: 97 mg/dL (ref 65–99)
Potassium: 4.3 mmol/L (ref 3.5–5.3)
Sodium: 140 mmol/L (ref 135–146)

## 2019-08-29 LAB — LIPID PANEL
Cholesterol: 150 mg/dL (ref <200)
HDL: 42 mg/dL (ref >=40)
LDL Cholesterol (Calc): 83 mg/dL (calc)
Non-HDL Cholesterol (Calc): 108 mg/dL (calc) (ref <130)
Total CHOL/HDL Ratio: 3.6 (calc) (ref <5.0)
Triglycerides: 158 mg/dL — ABNORMAL HIGH (ref <150)

## 2019-08-29 LAB — PSA: PSA: 3.4 ng/mL (ref <=4.0)

## 2019-08-31 ENCOUNTER — Ambulatory Visit (INDEPENDENT_AMBULATORY_CARE_PROVIDER_SITE_OTHER): Payer: PPO | Admitting: Nurse Practitioner

## 2019-08-31 ENCOUNTER — Other Ambulatory Visit: Payer: Self-pay

## 2019-08-31 ENCOUNTER — Encounter: Payer: Self-pay | Admitting: Nurse Practitioner

## 2019-08-31 VITALS — BP 124/68 | HR 74 | Temp 97.7°F | Ht 67.0 in | Wt 217.0 lb

## 2019-08-31 DIAGNOSIS — D696 Thrombocytopenia, unspecified: Secondary | ICD-10-CM

## 2019-08-31 DIAGNOSIS — E782 Mixed hyperlipidemia: Secondary | ICD-10-CM

## 2019-08-31 DIAGNOSIS — I480 Paroxysmal atrial fibrillation: Secondary | ICD-10-CM

## 2019-08-31 DIAGNOSIS — I10 Essential (primary) hypertension: Secondary | ICD-10-CM | POA: Diagnosis not present

## 2019-08-31 DIAGNOSIS — R972 Elevated prostate specific antigen [PSA]: Secondary | ICD-10-CM

## 2019-08-31 DIAGNOSIS — I700 Atherosclerosis of aorta: Secondary | ICD-10-CM

## 2019-08-31 DIAGNOSIS — Z902 Acquired absence of lung [part of]: Secondary | ICD-10-CM

## 2019-08-31 NOTE — Patient Instructions (Signed)
Follow up in 6 months- lab work prior to visit   Please change AWV to be with Devlynn Knoff    Encouraging you to quit smoking    DASH Eating Plan DASH stands for "Dietary Approaches to Stop Hypertension." The DASH eating plan is a healthy eating plan that has been shown to reduce high blood pressure (hypertension). It may also reduce your risk for type 2 diabetes, heart disease, and stroke. The DASH eating plan may also help with weight loss. What are tips for following this plan?  General guidelines  Avoid eating more than 2,300 mg (milligrams) of salt (sodium) a day. If you have hypertension, you may need to reduce your sodium intake to 1,500 mg a day.  Limit alcohol intake to no more than 1 drink a day for nonpregnant women and 2 drinks a day for men. One drink equals 12 oz of beer, 5 oz of wine, or 1 oz of hard liquor.  Work with your health care provider to maintain a healthy body weight or to lose weight. Ask what an ideal weight is for you.  Get at least 30 minutes of exercise that causes your heart to beat faster (aerobic exercise) most days of the week. Activities may include walking, swimming, or biking.  Work with your health care provider or diet and nutrition specialist (dietitian) to adjust your eating plan to your individual calorie needs. Reading food labels   Check food labels for the amount of sodium per serving. Choose foods with less than 5 percent of the Daily Value of sodium. Generally, foods with less than 300 mg of sodium per serving fit into this eating plan.  To find whole grains, look for the word "whole" as the first word in the ingredient list. Shopping  Buy products labeled as "low-sodium" or "no salt added."  Buy fresh foods. Avoid canned foods and premade or frozen meals. Cooking  Avoid adding salt when cooking. Use salt-free seasonings or herbs instead of table salt or sea salt. Check with your health care provider or pharmacist before using salt  substitutes.  Do not fry foods. Cook foods using healthy methods such as baking, boiling, grilling, and broiling instead.  Cook with heart-healthy oils, such as olive, canola, soybean, or sunflower oil. Meal planning  Eat a balanced diet that includes: ? 5 or more servings of fruits and vegetables each day. At each meal, try to fill half of your plate with fruits and vegetables. ? Up to 6-8 servings of whole grains each day. ? Less than 6 oz of lean meat, poultry, or fish each day. A 3-oz serving of meat is about the same size as a deck of cards. One egg equals 1 oz. ? 2 servings of low-fat dairy each day. ? A serving of nuts, seeds, or beans 5 times each week. ? Heart-healthy fats. Healthy fats called Omega-3 fatty acids are found in foods such as flaxseeds and coldwater fish, like sardines, salmon, and mackerel.  Limit how much you eat of the following: ? Canned or prepackaged foods. ? Food that is high in trans fat, such as fried foods. ? Food that is high in saturated fat, such as fatty meat. ? Sweets, desserts, sugary drinks, and other foods with added sugar. ? Full-fat dairy products.  Do not salt foods before eating.  Try to eat at least 2 vegetarian meals each week.  Eat more home-cooked food and less restaurant, buffet, and fast food.  When eating at a restaurant, ask that  your food be prepared with less salt or no salt, if possible. What foods are recommended? The items listed may not be a complete list. Talk with your dietitian about what dietary choices are best for you. Grains Whole-grain or whole-wheat bread. Whole-grain or whole-wheat pasta. Brown rice. [place]. Bulgur. Whole-grain and low-sodium cereals. Pita bread. Low-fat, low-sodium crackers. Whole-wheat flour tortillas. Vegetables Fresh or frozen vegetables (raw, steamed, roasted, or grilled). Low-sodium or reduced-sodium tomato and vegetable juice. Low-sodium or reduced-sodium tomato sauce and tomato  paste. Low-sodium or reduced-sodium canned vegetables. Fruits All fresh, dried, or frozen fruit. Canned fruit in natural juice (without added sugar). Meat and other protein foods Skinless chicken or [place]. Ground chicken or [place]. Pork with fat trimmed off. Fish and seafood. Egg whites. Dried beans, peas, or lentils. Unsalted nuts, nut butters, and seeds. Unsalted canned beans. Lean cuts of beef with fat trimmed off. Low-sodium, lean deli meat. Dairy Low-fat (1%) or fat-free (skim) milk. Fat-free, low-fat, or reduced-fat cheeses. Nonfat, low-sodium ricotta or cottage cheese. Low-fat or nonfat yogurt. Low-fat, low-sodium cheese. Fats and oils Soft margarine without trans fats. Vegetable oil. Low-fat, reduced-fat, or light mayonnaise and salad dressings (reduced-sodium). Canola, safflower, olive, soybean, and sunflower oils. Avocado. Seasoning and other foods Herbs. Spices. Seasoning mixes without salt. Unsalted popcorn and pretzels. Fat-free sweets. What foods are not recommended? The items listed may not be a complete list. Talk with your dietitian about what dietary choices are best for you. Grains Baked goods made with fat, such as croissants, muffins, or some breads. Dry pasta or rice meal packs. Vegetables Creamed or fried vegetables. Vegetables in a cheese sauce. Regular canned vegetables (not low-sodium or reduced-sodium). Regular canned tomato sauce and paste (not low-sodium or reduced-sodium). Regular tomato and vegetable juice (not low-sodium or reduced-sodium). Angie Fava. Olives. Fruits Canned fruit in a light or heavy syrup. Fried fruit. Fruit in cream or butter sauce. Meat and other protein foods Fatty cuts of meat. Ribs. Fried meat. Berniece Salines. Sausage. Bologna and other processed lunch meats. Salami. Fatback. Hotdogs. Bratwurst. Salted nuts and seeds. Canned beans with added salt. Canned or smoked fish. Whole eggs or egg yolks. Chicken or [place] with skin. Dairy Whole or 2% milk,  cream, and half-and-half. Whole or full-fat cream cheese. Whole-fat or sweetened yogurt. Full-fat cheese. Nondairy creamers. Whipped toppings. Processed cheese and cheese spreads. Fats and oils Butter. Stick margarine. Lard. Shortening. Ghee. Bacon fat. Tropical oils, such as coconut, palm kernel, or palm oil. Seasoning and other foods Salted popcorn and pretzels. Onion salt, garlic salt, seasoned salt, table salt, and sea salt. Worcestershire sauce. Tartar sauce. Barbecue sauce. Teriyaki sauce. Soy sauce, including reduced-sodium. Steak sauce. Canned and packaged gravies. Fish sauce. Oyster sauce. Cocktail sauce. Horseradish that you find on the shelf. Ketchup. Mustard. Meat flavorings and tenderizers. Bouillon cubes. Hot sauce and Tabasco sauce. Premade or packaged marinades. Premade or packaged taco seasonings. Relishes. Regular salad dressings. Where to find more information:  National Heart, Lung, and [place]: https://wilson-eaton.com/  American Heart Association: www.heart.org Summary  The DASH eating plan is a healthy eating plan that has been shown to reduce high blood pressure (hypertension). It may also reduce your risk for type 2 diabetes, heart disease, and stroke.  With the DASH eating plan, you should limit salt (sodium) intake to 2,300 mg a day. If you have hypertension, you may need to reduce your sodium intake to 1,500 mg a day.  When on the DASH eating plan, aim to eat more fresh fruits and  vegetables, whole grains, lean proteins, low-fat dairy, and heart-healthy fats.  Work with your health care provider or diet and nutrition specialist (dietitian) to adjust your eating plan to your individual calorie needs. This information is not intended to replace advice given to you by your health care provider. Make sure you discuss any questions you have with your health care provider. Document Revised: 06/14/2017 Document Reviewed: 06/25/2016 Elsevier Patient Education  2020 [employer].

## 2019-08-31 NOTE — Progress Notes (Signed)
Careteam: Patient Care Team: [name], NP as PCP - General (Nurse Practitioner) Druscilla Brownie, MD as Consulting Physician (Dermatology)  Advanced Directive information    Allergies  Allergen Reactions  . Asa [Aspirin] Anaphylaxis and Swelling    Angioedema/Eyes and lip swelling  . Nsaids Hives  . Tolmetin Hives  . Naproxen Hives    Chief Complaint  Patient presents with  . Medical Management of Chronic Issues    6 month follow-up and discuss labs   . Advanced Directive    Advance Care Planning     HPI: Patient is a 79 y.o. male for routine follow up.   Elevated PSA- routine monitoring of PSA, currently  3.4 down from 4.2 1 year ago.   Hyperglycemia- fasting blood sugar 97 on recent labs, A1c at goal.  Smoker- continues to smoke, maybe smoking a little less, smokes a little less than a ppd. No motivation to quit.   Hx of melanoma- had surgery to remove. Yearly follow up with dermatologist  Hyperlipidemia- triglycerides elevated at 158, loves cheese.   htn- controlled on hydralazine, losartan, metoprolol.   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 No signs of recurrence. Continues to smoke despite ongoing education.     Review of Systems:  Review of Systems  Constitutional: Negative for chills, fever and weight loss.  HENT: Negative for tinnitus.   Respiratory: Positive for cough and shortness of breath. Negative for sputum production.        Cough and shortness of breath related to smoking, without worsening of symptoms  Cardiovascular: Negative for chest pain, palpitations and leg swelling.  Gastrointestinal: Negative for abdominal pain, constipation, diarrhea and heartburn.  Genitourinary: Negative for dysuria, frequency and urgency.  Musculoskeletal: Negative for back pain, falls, joint pain and myalgias.  Skin: Negative.   Neurological: Negative for dizziness and headaches.    Psychiatric/Behavioral: Negative for depression and memory loss. The patient does not have insomnia.     Past Medical History:  Diagnosis Date  . A-fib ([place])   . Acute appendicitis with peritoneal abscess 01/28/2013  . Anxiety   . Arthritis   . Bacteremia September 2015  . Elevated PSA   . Essential hypertension 04/16/2014  . Heart murmur    childhood  . Ileus, postoperative ([place]) 01/28/2013  . Lung cancer ([place])   . Obesity, morbid ([place]) 01/28/2013  . Shortness of breath   . Tobacco use disorder 01/28/2013   Past Surgical History:  Procedure Laterality Date  . APPENDECTOMY    . [place], 2003   Dr.Apleton   . CARPAL TUNNEL RELEASE    . COLONOSCOPY     x 3  . EYE SURGERY Left    muscle of the eye   . KNEE SURGERY Bilateral   . LAPAROSCOPIC APPENDECTOMY N/A 01/25/2013   Procedure: APPENDECTOMY LAPAROSCOPIC;  Surgeon: Ralene Ok, MD;  Location: [place];  Service: General;  Laterality: N/A;  . LYMPH NODE DISSECTION Left 04/26/2015   Procedure: LYMPH NODE DISSECTION;  Surgeon: Grace Isaac, MD;  Location: [place];  Service: Thoracic;  Laterality: Left;  . SHOULDER SURGERY Right 2014   Dr.Whitfield rotator cuff surgery  . SKIN SURGERY Right 01/2019   Right Leg, cancerous area removed  . TEE WITHOUT CARDIOVERSION N/A 04/07/2014   Procedure: TRANSESOPHAGEAL ECHOCARDIOGRAM (TEE);  Surgeon: Candee Furbish, MD;  Location: [facility] ENDOSCOPY;  Service: Cardiovascular;  Laterality: N/A;  . VIDEO ASSISTED THORACOSCOPY (VATS)/WEDGE  RESECTION Left 04/26/2015   Procedure: LEFT VIDEO ASSISTED THORACOSCOPY (VATS) WITH LEFT LOWER LOBECTOMY;  Surgeon: Grace Isaac, MD;  Location: [place];  Service: Thoracic;  Laterality: Left;  Marland Kitchen VIDEO BRONCHOSCOPY N/A 04/26/2015   Procedure: VIDEO BRONCHOSCOPY;  Surgeon: Grace Isaac, MD;  Location: [place];  Service: Thoracic;  Laterality: N/A;   Social History:   reports that he has been smoking cigarettes and cigars. He started smoking about 61  years ago. He has a 30.00 pack-year smoking history. He quit smokeless tobacco use about 16 years ago.  His smokeless tobacco use included chew. He reports that he does not drink alcohol or use drugs.  Family History  Problem Relation Age of Onset  . Irritable bowel syndrome Mother   . Cancer Neg Hx   . Lung disease Neg Hx   . Colon cancer Neg Hx   . Colon polyps Neg Hx     Medications: Patient's Medications  New Prescriptions   No medications on file  Previous Medications   HYDRALAZINE (APRESOLINE) 25 MG TABLET    TAKE 1 TABLET BY MOUTH THREE TIMES DAILY FOR BLOOD PRESSURE   LOSARTAN (COZAAR) 50 MG TABLET    TAKE 1 TABLET BY MOUTH EVERY DAY FOR BLOOD PRESSURE   METOPROLOL TARTRATE (LOPRESSOR) 25 MG TABLET    TAKE 1/2 TABLET BY MOUTH TWICE DAILY   MULTIPLE VITAMIN (MULTIVITAMIN) TABLET    Take 1 tablet by mouth daily.  Modified Medications   No medications on file  Discontinued Medications   NUTRITIONAL SUPPLEMENTS (ADULT NUTRITIONAL SUPPLEMENT + PO)    Taking Total Restore 1 capsule by mouth three times daily patient states started 3 - 4 weeks ago    Physical Exam:  Vitals:   08/31/19 0936  BP: 124/68  Pulse: 74  Temp: 97.7 F (36.5 C)  TempSrc: Temporal  SpO2: 97%  Weight: 217 lb (98.4 kg)  Height: 5\' 7"  (1.702 m)   Body mass index is 33.99 kg/m. Wt Readings from Last 3 Encounters:  08/31/19 217 lb (98.4 kg)  03/02/19 210 lb (95.3 kg)  02/05/19 206 lb (93.4 kg)    Physical Exam Constitutional:      General: He is not in acute distress.    Appearance: He is well-developed. He is not diaphoretic.  HENT:     Head: Normocephalic and atraumatic.     Mouth/Throat:     Pharynx: No oropharyngeal exudate.  Eyes:     Conjunctiva/sclera: Conjunctivae normal.     Pupils: Pupils are equal, round, and reactive to light.  Cardiovascular:     Rate and Rhythm: Normal rate and regular rhythm.     Heart sounds: Normal heart sounds.  Pulmonary:     Effort: Pulmonary effort  is normal.     Comments: Diminished breath sounds throughout Abdominal:     General: Bowel sounds are normal.     Palpations: Abdomen is soft.  Musculoskeletal:        General: No tenderness.     Cervical back: Normal range of motion and neck supple.  Skin:    General: Skin is warm and dry.  Neurological:     Mental Status: He is alert and oriented to person, place, and time.     Labs reviewed: Basic Metabolic Panel: Recent Labs    02/26/19 0803 08/28/19 0826  NA 142 140  K 4.9 4.3  CL 107 105  CO2 29 27  GLUCOSE 95 97  BUN 21 20  CREATININE 0.86  0.89  CALCIUM 9.4 9.4   Liver Function Tests: Recent Labs    02/26/19 0803  AST 21  ALT 16  BILITOT 0.6  PROT 7.1   No results for input(s): LIPASE, AMYLASE in the last 8760 hours. No results for input(s): AMMONIA in the last 8760 hours. CBC: Recent Labs    02/26/19 0803  WBC 7.7  NEUTROABS 5,221  HGB 17.1  HCT 50.6*  MCV 96.2  PLT 235   Lipid Panel: Recent Labs    02/26/19 0803 08/28/19 0826  CHOL 156 150  HDL 39* 42  LDLCALC 91 83  TRIG 154* 158*  CHOLHDL 4.0 3.6   TSH: No results for input(s): TSH in the last 8760 hours. A1C: Lab Results  Component Value Date   HGBA1C 5.2 02/26/2019     Assessment/Plan 1. Paroxysmal atrial fibrillation (HCC) -remains in SR, no signs of recurrence, thought to be a stress response and therefore not on anticoagulation.   2. Obesity, morbid ([place]) Ongoing weight gain, encouraged diet modification with increase in physical activity.   3. Thrombocytopenia, unspecified (HCC) plts in normal range and stable at this time, continue to monitor  4. Essential hypertension -stable on metoprolol tartrate, losartan, and hydralazine  5. S/P lobectomy of lung -due to lung cancer, continues to follow up with cardiothoracic surgery yearly  6. Elevated PSA PSA stable at this time. Continue to monitor. No changes in urinary frequency or flow.   7. Aortic atherosclerosis  ([place]) Noted on imagining, discussed with pt today, he is allergic to ASA.   8. Mixed hyperlipidemia Diet controlled, continues to work on diet.   Next appt: 6 months. Carlos American. [place], [facility] [phone]

## 2019-09-03 ENCOUNTER — Ambulatory Visit: Payer: PPO | Admitting: Nurse Practitioner

## 2019-09-04 ENCOUNTER — Ambulatory Visit: Payer: PPO | Admitting: Nurse Practitioner

## 2019-09-14 DIAGNOSIS — H2513 Age-related nuclear cataract, bilateral: Secondary | ICD-10-CM | POA: Diagnosis not present

## 2019-09-14 DIAGNOSIS — H0102B Squamous blepharitis left eye, upper and lower eyelids: Secondary | ICD-10-CM | POA: Diagnosis not present

## 2019-09-14 DIAGNOSIS — H0102A Squamous blepharitis right eye, upper and lower eyelids: Secondary | ICD-10-CM | POA: Diagnosis not present

## 2019-09-21 ENCOUNTER — Other Ambulatory Visit: Payer: Self-pay | Admitting: Nurse Practitioner

## 2019-09-27 ENCOUNTER — Other Ambulatory Visit: Payer: Self-pay | Admitting: Nurse Practitioner

## 2019-11-20 ENCOUNTER — Other Ambulatory Visit: Payer: Self-pay | Admitting: Nurse Practitioner

## 2019-12-24 ENCOUNTER — Other Ambulatory Visit: Payer: Self-pay | Admitting: *Deleted

## 2019-12-24 DIAGNOSIS — Z85118 Personal history of other malignant neoplasm of bronchus and lung: Secondary | ICD-10-CM

## 2019-12-25 ENCOUNTER — Encounter: Payer: PPO | Admitting: Nurse Practitioner

## 2019-12-25 ENCOUNTER — Encounter: Payer: PPO | Admitting: Family

## 2019-12-28 ENCOUNTER — Ambulatory Visit (INDEPENDENT_AMBULATORY_CARE_PROVIDER_SITE_OTHER): Payer: PPO | Admitting: Nurse Practitioner

## 2019-12-28 ENCOUNTER — Other Ambulatory Visit: Payer: Self-pay | Admitting: Nurse Practitioner

## 2019-12-28 ENCOUNTER — Encounter: Payer: PPO | Admitting: Nurse Practitioner

## 2019-12-28 ENCOUNTER — Encounter: Payer: Self-pay | Admitting: Nurse Practitioner

## 2019-12-28 ENCOUNTER — Other Ambulatory Visit: Payer: Self-pay

## 2019-12-28 VITALS — BP 130/74 | HR 72 | Temp 96.8°F | Ht 67.0 in | Wt 217.0 lb

## 2019-12-28 DIAGNOSIS — I10 Essential (primary) hypertension: Secondary | ICD-10-CM

## 2019-12-28 DIAGNOSIS — J302 Other seasonal allergic rhinitis: Secondary | ICD-10-CM | POA: Diagnosis not present

## 2019-12-28 DIAGNOSIS — R600 Localized edema: Secondary | ICD-10-CM

## 2019-12-28 MED ORDER — HYDROCHLOROTHIAZIDE 25 MG PO TABS: 25.0000 mg | ORAL_TABLET | Freq: Every day | ORAL | 1 refills | Status: DC

## 2019-12-28 NOTE — Patient Instructions (Signed)
To start hydrochlorothiazide 25 mg daily to help reduce swelling Continue to elevate legs when sitting Would recommend laying down for 45 mins to 1 hour in the afternoon to help reduce To wear compression hose daily- on in am, off at bedtime Reduce overall sodium intake.   Follow up in 2 weeks to follow up blood pressure and labs    DASH Eating Plan DASH stands for "Dietary Approaches to Stop Hypertension." The DASH eating plan is a healthy eating plan that has been shown to reduce high blood pressure (hypertension). It may also reduce your risk for type 2 diabetes, heart disease, and stroke. The DASH eating plan may also help with weight loss. What are tips for following this plan?  General guidelines  Avoid eating more than 2,300 mg (milligrams) of salt (sodium) a day. If you have hypertension, you may need to reduce your sodium intake to 1,500 mg a day.  Limit alcohol intake to no more than 1 drink a day for nonpregnant women and 2 drinks a day for men. One drink equals 12 oz of beer, 5 oz of wine, or 1 oz of hard liquor.  Work with your health care provider to maintain a healthy body weight or to lose weight. Ask what an ideal weight is for you.  Get at least 30 minutes of exercise that causes your heart to beat faster (aerobic exercise) most days of the week. Activities may include walking, swimming, or biking.  Work with your health care provider or diet and nutrition specialist (dietitian) to adjust your eating plan to your individual calorie needs. Reading food labels   Check food labels for the amount of sodium per serving. Choose foods with less than 5 percent of the Daily Value of sodium. Generally, foods with less than 300 mg of sodium per serving fit into this eating plan.  To find whole grains, look for the word "whole" as the first word in the ingredient list. Shopping  Buy products labeled as "low-sodium" or "no salt added."  Buy fresh foods. Avoid canned foods and  premade or frozen meals. Cooking  Avoid adding salt when cooking. Use salt-free seasonings or herbs instead of table salt or sea salt. Check with your health care provider or pharmacist before using salt substitutes.  Do not fry foods. Cook foods using healthy methods such as baking, boiling, grilling, and broiling instead.  Cook with heart-healthy oils, such as olive, canola, soybean, or sunflower oil. Meal planning  Eat a balanced diet that includes: ? 5 or more servings of fruits and vegetables each day. At each meal, try to fill half of your plate with fruits and vegetables. ? Up to 6-8 servings of whole grains each day. ? Less than 6 oz of lean meat, poultry, or fish each day. A 3-oz serving of meat is about the same size as a deck of cards. One egg equals 1 oz. ? 2 servings of low-fat dairy each day. ? A serving of nuts, seeds, or beans 5 times each week. ? Heart-healthy fats. Healthy fats called Omega-3 fatty acids are found in foods such as flaxseeds and coldwater fish, like sardines, salmon, and mackerel.  Limit how much you eat of the following: ? Canned or prepackaged foods. ? Food that is high in trans fat, such as fried foods. ? Food that is high in saturated fat, such as fatty meat. ? Sweets, desserts, sugary drinks, and other foods with added sugar. ? Full-fat dairy products.  Do not salt  foods before eating.  Try to eat at least 2 vegetarian meals each week.  Eat more home-cooked food and less restaurant, buffet, and fast food.  When eating at a restaurant, ask that your food be prepared with less salt or no salt, if possible. What foods are recommended? The items listed may not be a complete list. Talk with your dietitian about what dietary choices are best for you. Grains Whole-grain or whole-wheat bread. Whole-grain or whole-wheat pasta. Brown rice. [place]ena Morrow. Bulgur. Whole-grain and low-sodium cereals. Pita bread. Low-fat, low-sodium crackers. Whole-wheat  flour tortillas. Vegetables Fresh or frozen vegetables (raw, steamed, roasted, or grilled). Low-sodium or reduced-sodium tomato and vegetable juice. Low-sodium or reduced-sodium tomato sauce and tomato paste. Low-sodium or reduced-sodium canned vegetables. Fruits All fresh, dried, or frozen fruit. Canned fruit in natural juice (without added sugar). Meat and other protein foods Skinless chicken or [place]. Ground chicken or [place]. Pork with fat trimmed off. Fish and seafood. Egg whites. Dried beans, peas, or lentils. Unsalted nuts, nut butters, and seeds. Unsalted canned beans. Lean cuts of beef with fat trimmed off. Low-sodium, lean deli meat. Dairy Low-fat (1%) or fat-free (skim) milk. Fat-free, low-fat, or reduced-fat cheeses. Nonfat, low-sodium ricotta or cottage cheese. Low-fat or nonfat yogurt. Low-fat, low-sodium cheese. Fats and oils Soft margarine without trans fats. Vegetable oil. Low-fat, reduced-fat, or light mayonnaise and salad dressings (reduced-sodium). Canola, safflower, olive, soybean, and sunflower oils. Avocado. Seasoning and other foods Herbs. Spices. Seasoning mixes without salt. Unsalted popcorn and pretzels. Fat-free sweets. What foods are not recommended? The items listed may not be a complete list. Talk with your dietitian about what dietary choices are best for you. Grains Baked goods made with fat, such as croissants, muffins, or some breads. Dry pasta or rice meal packs. Vegetables Creamed or fried vegetables. Vegetables in a cheese sauce. Regular canned vegetables (not low-sodium or reduced-sodium). Regular canned tomato sauce and paste (not low-sodium or reduced-sodium). Regular tomato and vegetable juice (not low-sodium or reduced-sodium). Angie Fava. Olives. Fruits Canned fruit in a light or heavy syrup. Fried fruit. Fruit in cream or butter sauce. Meat and other protein foods Fatty cuts of meat. Ribs. Fried meat. Berniece Salines. Sausage. Bologna and other processed lunch  meats. Salami. Fatback. Hotdogs. Bratwurst. Salted nuts and seeds. Canned beans with added salt. Canned or smoked fish. Whole eggs or egg yolks. Chicken or [place] with skin. Dairy Whole or 2% milk, cream, and half-and-half. Whole or full-fat cream cheese. Whole-fat or sweetened yogurt. Full-fat cheese. Nondairy creamers. Whipped toppings. Processed cheese and cheese spreads. Fats and oils Butter. Stick margarine. Lard. Shortening. Ghee. Bacon fat. Tropical oils, such as coconut, palm kernel, or palm oil. Seasoning and other foods Salted popcorn and pretzels. Onion salt, garlic salt, seasoned salt, table salt, and sea salt. Worcestershire sauce. Tartar sauce. Barbecue sauce. Teriyaki sauce. Soy sauce, including reduced-sodium. Steak sauce. Canned and packaged gravies. Fish sauce. Oyster sauce. Cocktail sauce. Horseradish that you find on the shelf. Ketchup. Mustard. Meat flavorings and tenderizers. Bouillon cubes. Hot sauce and Tabasco sauce. Premade or packaged marinades. Premade or packaged taco seasonings. Relishes. Regular salad dressings. Where to find more information:  National Heart, Lung, and [place]: https://wilson-eaton.com/  American Heart Association: www.heart.org Summary  The DASH eating plan is a healthy eating plan that has been shown to reduce high blood pressure (hypertension). It may also reduce your risk for type 2 diabetes, heart disease, and stroke.  With the DASH eating plan, you should limit salt (sodium) intake to 2,300  mg a day. If you have hypertension, you may need to reduce your sodium intake to 1,500 mg a day.  When on the DASH eating plan, aim to eat more fresh fruits and vegetables, whole grains, lean proteins, low-fat dairy, and heart-healthy fats.  Work with your health care provider or diet and nutrition specialist (dietitian) to adjust your eating plan to your individual calorie needs. This information is not intended to replace advice given to you by your  health care provider. Make sure you discuss any questions you have with your health care provider. Document Revised: 06/14/2017 Document Reviewed: 06/25/2016 Elsevier Patient Education  2020 [employer].

## 2019-12-28 NOTE — Progress Notes (Signed)
Careteam: Patient Care Team: [name], NP as PCP - General (Nurse Practitioner) Druscilla Brownie, MD as Consulting Physician (Dermatology)  PLACE OF SERVICE:  [place]  Advanced Directive information    Allergies  Allergen Reactions   Asa [Aspirin] Anaphylaxis and Swelling    Angioedema/Eyes and lip swelling   Nsaids Hives   Tolmetin Hives   Naproxen Hives    Chief Complaint  Patient presents with   Acute Visit    Left foot and ankle swelling   Ear Problem    Examine ear for ear fullness      HPI: Patient is a 79 y.o. male for left ankle swelling. Reports off and on thing but for the last month or 2 has been staying.  Reports at night it goes down (never leaves) but then when he gets up in the morning it gets worse.  No pain or redness  Swollen but not bad today.  Got a recliner 3 weeks ago, now sitting in this routinely and feels like area getting better.  [employer] and corn chips   Chronic back pain- followed with neurosurgery and recommended extensive surgery that would require 2 days in the OR and pt did not wish to peruse this. Now having some numbness and tingling in toes. Reports back does not hurt that much but is weak.   Reports funny sensation to left ear. Pain that comes and goes. No pain at this time.  Reports itchy eyes and runny.  Review of Systems:  Review of Systems  Constitutional: Negative for chills and fever.  Respiratory: Negative for cough, sputum production and shortness of breath.   Cardiovascular: Positive for leg swelling. Negative for chest pain.  Genitourinary: Negative for dysuria and frequency.  Musculoskeletal: Positive for back pain. Negative for falls, joint pain and myalgias.  Skin: Negative for itching and rash.  Neurological: Positive for tingling. Negative for dizziness.    Past Medical History:  Diagnosis Date   A-fib [facility])    Acute appendicitis with peritoneal abscess 01/28/2013   Anxiety     Arthritis    Bacteremia September 2015   Elevated PSA    Essential hypertension 04/16/2014   Heart murmur    childhood   Ileus, postoperative ([place]) 01/28/2013   Lung cancer ([place])    Obesity, morbid ([place]) 01/28/2013   Shortness of breath    Tobacco use disorder 01/28/2013   Past Surgical History:  Procedure Laterality Date   APPENDECTOMY     BACK SURGERY  1997, 2003   Dr.Apleton    CARPAL TUNNEL RELEASE     COLONOSCOPY     x 3   EYE SURGERY Left    muscle of the eye    KNEE SURGERY Bilateral    LAPAROSCOPIC APPENDECTOMY N/A 01/25/2013   Procedure: APPENDECTOMY LAPAROSCOPIC;  Surgeon: Ralene Ok, MD;  Location: [place];  Service: General;  Laterality: N/A;   LYMPH NODE DISSECTION Left 04/26/2015   Procedure: LYMPH NODE DISSECTION;  Surgeon: Grace Isaac, MD;  Location: [place];  Service: Thoracic;  Laterality: Left;   SHOULDER SURGERY Right 2014   Dr.Whitfield rotator cuff surgery   SKIN SURGERY Right 01/2019   Right Leg, cancerous area removed   TEE WITHOUT CARDIOVERSION N/A 04/07/2014   Procedure: TRANSESOPHAGEAL ECHOCARDIOGRAM (TEE);  Surgeon: Candee Furbish, MD;  [facility] ENDOSCOPY;  Service: Cardiovascular;  Laterality: N/A;   VIDEO ASSISTED THORACOSCOPY (VATS)/WEDGE RESECTION Left 04/26/2015   Procedure: LEFT VIDEO ASSISTED THORACOSCOPY (VATS) WITH LEFT  LOWER LOBECTOMY;  Surgeon: Grace Isaac, MD;  Location: [place];  Service: Thoracic;  Laterality: Left;   VIDEO BRONCHOSCOPY N/A 04/26/2015   Procedure: VIDEO BRONCHOSCOPY;  Surgeon: Grace Isaac, MD;  Location: [place];  Service: Thoracic;  Laterality: N/A;   Social History:   reports that he has been smoking cigarettes and cigars. He started smoking about 61 years ago. He has a 30.00 pack-year smoking history. He quit smokeless tobacco use about 16 years ago.  His smokeless tobacco use included chew. He reports that he does not drink alcohol and does not use drugs.  Family History  Problem  Relation Age of Onset   Irritable bowel syndrome Mother    Cancer Neg Hx    Lung disease Neg Hx    Colon cancer Neg Hx    Colon polyps Neg Hx     Medications: Patient's Medications  New Prescriptions   No medications on file  Previous Medications   HYDRALAZINE (APRESOLINE) 25 MG TABLET    TAKE 1 TABLET BY MOUTH THREE TIMES DAILY FOR BLOOD PRESSURE   LOSARTAN (COZAAR) 50 MG TABLET    TAKE 1 TABLET BY MOUTH EVERY DAY FOR BLOOD PRESSURE   METOPROLOL TARTRATE (LOPRESSOR) 25 MG TABLET    TAKE 1/2 TABLET BY MOUTH TWICE DAILY   MULTIPLE VITAMIN (MULTIVITAMIN) TABLET    Take 1 tablet by mouth daily.  Modified Medications   No medications on file  Discontinued Medications   No medications on file    Physical Exam:  Vitals:   12/28/19 1420  BP: 130/74  Pulse: 72  Temp: (!) 96.8 F (36 C)  TempSrc: Temporal  SpO2: 96%  Weight: 217 lb (98.4 kg)  Height: 5\' 7"  (1.702 m)   Body mass index is 33.99 kg/m. Wt Readings from Last 3 Encounters:  12/28/19 217 lb (98.4 kg)  08/31/19 217 lb (98.4 kg)  03/02/19 210 lb (95.3 kg)    Physical Exam Constitutional:      Appearance: Normal appearance.  HENT:     Right Ear: Tympanic membrane, ear canal and external ear normal. There is no impacted cerumen.     Left Ear: Tympanic membrane, ear canal and external ear normal. There is no impacted cerumen.  Cardiovascular:     Rate and Rhythm: Normal rate and regular rhythm.  Pulmonary:     Effort: Pulmonary effort is normal.     Breath sounds: Normal breath sounds.  Musculoskeletal:     Right lower leg: No edema.     Left lower leg: 1+ Pitting Edema present.  Skin:    General: Skin is warm and dry.     Findings: No erythema.  Neurological:     Mental Status: He is alert and oriented to person, place, and time. Mental status is at baseline.     Labs reviewed: Basic Metabolic Panel: Recent Labs    02/26/19 0803 08/28/19 0826  NA 142 140  K 4.9 4.3  CL 107 105  CO2 29 27    GLUCOSE 95 97  BUN 21 20  CREATININE 0.86 0.89  CALCIUM 9.4 9.4   Liver Function Tests: Recent Labs    02/26/19 0803  AST 21  ALT 16  BILITOT 0.6  PROT 7.1   No results for input(s): LIPASE, AMYLASE in the last 8760 hours. No results for input(s): AMMONIA in the last 8760 hours. CBC: Recent Labs    02/26/19 0803  WBC 7.7  NEUTROABS 5,221  HGB 17.1  HCT  50.6*  MCV 96.2  PLT 235   Lipid Panel: Recent Labs    02/26/19 0803 08/28/19 0826  CHOL 156 150  HDL 39* 42  LDLCALC 91 83  TRIG 154* 158*  CHOLHDL 4.0 3.6   TSH: No results for input(s): TSH in the last 8760 hours. A1C: Lab Results  Component Value Date   HGBA1C 5.2 02/26/2019     Assessment/Plan 1. Lower extremity edema due to venous insufficiency  -chronic but now more persist to left lower leg. Better in am but worse as the day progresses, no pain or tenderness -discussed low sodium diet -elevate legs above level of heart -compression hose during the day -will add hctz daily at this time.  - hydrochlorothiazide (HYDRODIURIL) 25 MG tablet; Take 1 tablet (25 mg total) by mouth daily.  Dispense: 30 tablet; Refill: 1  2. Essential hypertension -stable at this time, adding hctz for LE edema but will benefit bp as well. Will recheck bp in 2 weeks at follow up  3. Seasonal allergies -to start OTC Claritin, zyrtec or allegra daily   Next appt: To follow up in 2 weeks on swelling, htn and lab Mylin Hirano K. [place], [place] [phone]

## 2019-12-29 ENCOUNTER — Encounter: Payer: Self-pay | Admitting: Nurse Practitioner

## 2019-12-29 ENCOUNTER — Telehealth: Payer: Self-pay

## 2019-12-29 ENCOUNTER — Other Ambulatory Visit: Payer: Self-pay

## 2019-12-29 ENCOUNTER — Ambulatory Visit (INDEPENDENT_AMBULATORY_CARE_PROVIDER_SITE_OTHER): Payer: PPO | Admitting: Nurse Practitioner

## 2019-12-29 DIAGNOSIS — Z0000 Encounter for general adult medical examination without abnormal findings: Secondary | ICD-10-CM

## 2019-12-29 NOTE — Progress Notes (Signed)
This service is provided via telemedicine  No vital signs collected/recorded due to the encounter was a telemedicine visit.   Location of patient (ex: home, work):  Home  Patient consents to a telephone visit:  Yes, see telephone encounter dated 12/29/2019  Location of the provider (ex: office, home):  [place]  Name of any referring provider:  N/A  Names of all persons participating in the telemedicine service and their role in the encounter:  Sherrie Mustache, Nurse Practitioner, Carroll Kinds, CMA, and patient.   Time spent on call:  5 Minutes with medical assistant

## 2019-12-29 NOTE — Patient Instructions (Signed)
[name] , Thank you for taking time to come for your Medicare Wellness Visit. I appreciate your ongoing commitment to your health goals. Please review the following plan we discussed and let me know if I can assist you in the future.   Screening recommendations/referrals: Colonoscopy up to date, due 2022 Recommended yearly ophthalmology/optometry visit for glaucoma screening and checkup Recommended yearly dental visit for hygiene and checkup  Vaccinations: Influenza vaccine Due sept 2021 Pneumococcal vaccine-up to date Tdap vaccine -up to ate Shingles vaccine up to date    Advanced directives: recommended to complete, can place MOST form on file at next appt  Conditions/risks identified: cardiovascular disease, obesity, sedentary lifestyle, smoker.   Next appointment: 1 year.   Preventive Care 44 Years and Older, Male Preventive care refers to lifestyle choices and visits with your health care provider that can promote health and wellness. What does preventive care include?  A yearly physical exam. This is also called an annual well check.  Dental exams once or twice a year.  Routine eye exams. Ask your health care provider how often you should have your eyes checked.  Personal lifestyle choices, including:  Daily care of your teeth and gums.  Regular physical activity.  Eating a healthy diet.  Avoiding tobacco and drug use.  Limiting alcohol use.  Practicing safe sex.  Taking low doses of aspirin every day.  Taking vitamin and mineral supplements as recommended by your health care provider. What happens during an annual well check? The services and screenings done by your health care provider during your annual well check will depend on your age, overall health, lifestyle risk factors, and family history of disease. Counseling  Your health care provider may ask you questions about your:  Alcohol use.  Tobacco use.  Drug use.  Emotional well-being.  Home  and relationship well-being.  Sexual activity.  Eating habits.  History of falls.  Memory and ability to understand (cognition).  Work and work Statistician. Screening  You may have the following tests or measurements:  Height, weight, and BMI.  Blood pressure.  Lipid and cholesterol levels. These may be checked every 5 years, or more frequently if you are over 4 years old.  Skin check.  Lung cancer screening. You may have this screening every year starting at age 17 if you have a 30-pack-year history of smoking and currently smoke or have quit within the past 15 years.  Fecal occult blood test (FOBT) of the stool. You may have this test every year starting at age 48.  Flexible sigmoidoscopy or colonoscopy. You may have a sigmoidoscopy every 5 years or a colonoscopy every 10 years starting at age 30.  Prostate cancer screening. Recommendations will vary depending on your family history and other risks.  Hepatitis C blood test.  Hepatitis B blood test.  Sexually transmitted disease (STD) testing.  Diabetes screening. This is done by checking your blood sugar (glucose) after you have not eaten for a while (fasting). You may have this done every 1-3 years.  Abdominal aortic aneurysm (AAA) screening. You may need this if you are a current or former smoker.  Osteoporosis. You may be screened starting at age 24 if you are at high risk. Talk with your health care provider about your test results, treatment options, and if necessary, the need for more tests. Vaccines  Your health care provider may recommend certain vaccines, such as:  Influenza vaccine. This is recommended every year.  Tetanus, diphtheria, and acellular pertussis (  Tdap, Td) vaccine. You may need a Td booster every 10 years.  Zoster vaccine. You may need this after age 64.  Pneumococcal 13-valent conjugate (PCV13) vaccine. One dose is recommended after age 36.  Pneumococcal polysaccharide (PPSV23) vaccine.  One dose is recommended after age 29. Talk to your health care provider about which screenings and vaccines you need and how often you need them. This information is not intended to replace advice given to you by your health care provider. Make sure you discuss any questions you have with your health care provider. Document Released: 07/29/2015 Document Revised: 03/21/2016 Document Reviewed: 05/03/2015 Elsevier Interactive Patient Education  2017 [employer] Prevention in the Home Falls can cause injuries. They can happen to people of all ages. There are many things you can do to make your home safe and to help prevent falls. What can I do on the outside of my home?  Regularly fix the edges of walkways and driveways and fix any cracks.  Remove anything that might make you trip as you walk through a door, such as a raised step or threshold.  Trim any bushes or trees on the path to your home.  Use bright outdoor lighting.  Clear any walking paths of anything that might make someone trip, such as rocks or tools.  Regularly check to see if handrails are loose or broken. Make sure that both sides of any steps have handrails.  Any raised decks and porches should have guardrails on the edges.  Have any leaves, snow, or ice cleared regularly.  Use sand or salt on walking paths during winter.  Clean up any spills in your garage right away. This includes oil or grease spills. What can I do in the bathroom?  Use night lights.  Install grab bars by the toilet and in the tub and shower. Do not use towel bars as grab bars.  Use non-skid mats or decals in the tub or shower.  If you need to sit down in the shower, use a plastic, non-slip stool.  Keep the floor dry. Clean up any water that spills on the floor as soon as it happens.  Remove soap buildup in the tub or shower regularly.  Attach bath mats securely with double-sided non-slip rug tape.  Do not have throw rugs and other  things on the floor that can make you trip. What can I do in the bedroom?  Use night lights.  Make sure that you have a light by your bed that is easy to reach.  Do not use any sheets or blankets that are too big for your bed. They should not hang down onto the floor.  Have a firm chair that has side arms. You can use this for support while you get dressed.  Do not have throw rugs and other things on the floor that can make you trip. What can I do in the kitchen?  Clean up any spills right away.  Avoid walking on wet floors.  Keep items that you use a lot in easy-to-reach places.  If you need to reach something above you, use a strong step stool that has a grab bar.  Keep electrical cords out of the way.  Do not use floor polish or wax that makes floors slippery. If you must use wax, use non-skid floor wax.  Do not have throw rugs and other things on the floor that can make you trip. What can I do with my stairs?  Do  not leave any items on the stairs.  Make sure that there are handrails on both sides of the stairs and use them. Fix handrails that are broken or loose. Make sure that handrails are as long as the stairways.  Check any carpeting to make sure that it is firmly attached to the stairs. Fix any carpet that is loose or worn.  Avoid having throw rugs at the top or bottom of the stairs. If you do have throw rugs, attach them to the floor with carpet tape.  Make sure that you have a light switch at the top of the stairs and the bottom of the stairs. If you do not have them, ask someone to add them for you. What else can I do to help prevent falls?  Wear shoes that:  Do not have high heels.  Have rubber bottoms.  Are comfortable and fit you well.  Are closed at the toe. Do not wear sandals.  If you use a stepladder:  Make sure that it is fully opened. Do not climb a closed stepladder.  Make sure that both sides of the stepladder are locked into place.  Ask  someone to hold it for you, if possible.  Clearly mark and make sure that you can see:  Any grab bars or handrails.  First and last steps.  Where the edge of each step is.  Use tools that help you move around (mobility aids) if they are needed. These include:  Canes.  Walkers.  Scooters.  Crutches.  Turn on the lights when you go into a dark area. Replace any light bulbs as soon as they burn out.  Set up your furniture so you have a clear path. Avoid moving your furniture around.  If any of your floors are uneven, fix them.  If there are any pets around you, be aware of where they are.  Review your medicines with your doctor. Some medicines can make you feel dizzy. This can increase your chance of falling. Ask your doctor what other things that you can do to help prevent falls. This information is not intended to replace advice given to you by your health care provider. Make sure you discuss any questions you have with your health care provider. Document Released: 04/28/2009 Document Revised: 12/08/2015 Document Reviewed: 08/06/2014 Elsevier Interactive Patient Education  2017 [employer].

## 2019-12-29 NOTE — Progress Notes (Signed)
Subjective:   [name] is a 79 y.o. male who presents for Medicare Annual/Subsequent preventive examination.  Review of Systems:   Cardiac Risk Factors include: advanced age (>17men, >38 women);sedentary lifestyle;obesity (BMI >30kg/m2);smoking/ tobacco exposure;hypertension;dyslipidemia;male gender     Objective:    Vitals: There were no vitals taken for this visit.  There is no height or weight on file to calculate BMI.  Advanced Directives 12/29/2019 12/23/2018 08/28/2018 04/24/2018 12/18/2017 11/18/2017 07/24/2017  Does Patient Have a Medical Advance Directive? No No No No No No No  Would patient like information on creating a medical advance directive? - No - Patient declined No - Patient declined - Yes (MAU/Ambulatory/Procedural Areas - Information given) No - Patient declined -  Pre-existing out of facility DNR order (yellow form or pink MOST form) - - - - - - -    Tobacco Social History   Tobacco Use  Smoking Status Current Every Day Smoker  . Packs/day: 0.50  . Years: 60.00  . Pack years: 30.00  . Types: Cigarettes, Cigars  . Start date: 07/16/1958  Smokeless Tobacco Former Systems developer  . Types: Chew  . Quit date: 07/17/2003     Ready to quit: Not Answered Counseling given: Not Answered   Clinical Intake:  Pre-visit preparation completed: Yes  Pain : No/denies pain     BMI - recorded: 33.99 Nutritional Risks: None Diabetes: No  How often do you need to have someone help you when you read instructions, pamphlets, or other written materials from your doctor or pharmacy?: 1 - Never        Past Medical History:  Diagnosis Date  . A-fib ([place])   . Acute appendicitis with peritoneal abscess 01/28/2013  . Anxiety   . Arthritis   . Bacteremia September 2015  . Elevated PSA   . Essential hypertension 04/16/2014  . Heart murmur    childhood  . Ileus, postoperative ([place]) 01/28/2013  . Lung cancer ([place])   . Obesity, morbid ([place]) 01/28/2013  . Shortness of breath   .  Tobacco use disorder 01/28/2013   Past Surgical History:  Procedure Laterality Date  . APPENDECTOMY    . [place], 2003   Dr.Apleton   . CARPAL TUNNEL RELEASE    . COLONOSCOPY     x 3  . EYE SURGERY Left    muscle of the eye   . KNEE SURGERY Bilateral   . LAPAROSCOPIC APPENDECTOMY N/A 01/25/2013   Procedure: APPENDECTOMY LAPAROSCOPIC;  Surgeon: Ralene Ok, MD;  Location: [facility];  Service: General;  Laterality: N/A;  . LYMPH NODE DISSECTION Left 04/26/2015   Procedure: LYMPH NODE DISSECTION;  Surgeon: Grace Isaac, MD;  Location: [facility];  Service: Thoracic;  Laterality: Left;  . SHOULDER SURGERY Right 2014   Dr.Whitfield rotator cuff surgery  . SKIN SURGERY Right 01/2019   Right Leg, cancerous area removed  . TEE WITHOUT CARDIOVERSION N/A 04/07/2014   Procedure: TRANSESOPHAGEAL ECHOCARDIOGRAM (TEE);  Surgeon: Candee Furbish, MD;  Location: [facility] ENDOSCOPY;  Service: Cardiovascular;  Laterality: N/A;  . VIDEO ASSISTED THORACOSCOPY (VATS)/WEDGE RESECTION Left 04/26/2015   Procedure: LEFT VIDEO ASSISTED THORACOSCOPY (VATS) WITH LEFT LOWER LOBECTOMY;  Surgeon: Grace Isaac, MD;  Location: [place];  Service: Thoracic;  Laterality: Left;  Marland Kitchen VIDEO BRONCHOSCOPY N/A 04/26/2015   Procedure: VIDEO BRONCHOSCOPY;  Surgeon: Grace Isaac, MD;  Location: [facility] OR;  Service: Thoracic;  Laterality: N/A;   Family History  Problem Relation Age of Onset  .  Irritable bowel syndrome Mother   . Cancer Neg Hx   . Lung disease Neg Hx   . Colon cancer Neg Hx   . Colon polyps Neg Hx    Social History   Socioeconomic History  . Marital status: Divorced    Spouse name: Not on file  . Number of children: 0  . Years of education: Not on file  . Highest education level: Not on file  Occupational History  . Occupation: Retired  Tobacco Use  . Smoking status: Current Every Day Smoker    Packs/day: 0.50    Years: 60.00    Pack years: 30.00    Types: Cigarettes, Cigars    Start date:  07/16/1958  . Smokeless tobacco: Former Systems developer    Types: [place] date: 07/17/2003  Vaping Use  . Vaping Use: Former  . Quit date: 07/16/2008  Substance and Sexual Activity  . Alcohol use: No    Alcohol/week: 0.0 standard drinks  . Drug use: No  . Sexual activity: Not Currently  Other Topics Concern  . Not on file  Social History Narrative   No diet   Yes, eats/drinks things with caffeine    Divorced, married 1979   Lives in a house, one stories , one person, no pets   Current/past profession- [employer]   Patient exercises, golf 3 days weekly        Originally from [place]. Previously lived in [facility] for [place] years and moved back to [place] in 1968. No international travel. He has traveled through multiple states traveling to [place]. 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 Unsure if he was exposed to any asbestos. No mold exposure. No pets currently. Previously has owned dogs. Parakeet as a child. No hot tub exposure. Enjoys golfing.   Social Determinants of Health   Financial Resource Strain:   . Difficulty of Paying Living Expenses:   Food Insecurity:   . Worried About Charity fundraiser in the Last Year:   . Arboriculturist in the Last Year:   Transportation Needs:   . Film/video editor (Medical):   Marland Kitchen Lack of Transportation (Non-Medical):   Physical Activity:   . Days of Exercise per Week:   . Minutes of Exercise per Session:   Stress:   . Feeling of Stress :   Social Connections:   . Frequency of Communication with Friends and Family:   . Frequency of Social Gatherings with Friends and Family:   . Attends Religious Services:   . Active Member of Clubs or Organizations:   . Attends Archivist Meetings:   Marland Kitchen Marital Status:     Outpatient Encounter Medications as of 12/29/2019  Medication Sig  . hydrALAZINE (APRESOLINE) 25 MG tablet TAKE 1 TABLET BY MOUTH THREE TIMES DAILY FOR BLOOD PRESSURE    . hydrochlorothiazide (HYDRODIURIL) 25 MG tablet Take 1 tablet (25 mg total) by mouth daily.  Marland Kitchen losartan (COZAAR) 50 MG tablet TAKE 1 TABLET BY MOUTH EVERY DAY FOR BLOOD PRESSURE  . metoprolol tartrate (LOPRESSOR) 25 MG tablet TAKE 1/2 TABLET BY MOUTH TWICE DAILY  . Multiple Vitamin (MULTIVITAMIN) tablet Take 1 tablet by mouth daily.   No facility-administered encounter medications on file as of 12/29/2019.    Activities of Daily Living In your present state of health, do you have any difficulty performing the following activities: 12/29/2019  Hearing? Y  Vision? N  Difficulty concentrating or making decisions? N  Walking or climbing stairs? Y  Comment due to weakness in back  Dressing or bathing? N  Doing errands, shopping? N  Preparing Food and eating ? N  Using the Toilet? N  In the past six months, have you accidently leaked urine? N  Do you have problems with loss of bowel control? N  Managing your Medications? N  Managing your Finances? N  Housekeeping or managing your Housekeeping? N  Some recent data might be hidden    Patient Care Team: [name], NP as PCP - General (Nurse Practitioner) Druscilla Brownie, MD as Consulting Physician (Dermatology)   Assessment:   This is a routine wellness examination for [facility].  Exercise Activities and Dietary recommendations Current Exercise Habits: Home exercise routine, Type of exercise: calisthenics;Other - see comments, Time (Minutes): 30, Frequency (Times/Week): 5, Weekly Exercise (Minutes/Week): 150  Goals    .  Quit smoking / using tobacco    .  Reduce portion size      Starting 11/23/2016 I will reduce portion size and increase water intake to 3-4 cups of water a day.    .  Weight (lb) < 200 lb (90.7 kg) (pt-stated)      Through diet modifications.        Fall Risk Fall Risk  12/29/2019 08/31/2019 03/02/2019 12/23/2018 09/02/2018  Falls in the past year? 0 0 0 0 0  Number falls in past yr: 0 0 0 0 0  Comment - -  - - -  Injury with Fall? 0 0 0 0 0  Comment - - - - -   Is the patient's home free of loose throw rugs in walkways, pet beds, electrical cords, etc?   yes      Grab bars in the bathroom? yes      Handrails on the stairs?   no stairs      Adequate lighting?   yes  Timed Get Up and Go Performed: na  Depression Screen PHQ 2/9 Scores 12/29/2019 12/23/2018 12/18/2017 11/23/2016  PHQ - 2 Score 0 0 0 0    Cognitive Function MMSE - Mini Mental State Exam 12/23/2018 11/23/2016 02/14/2016 07/29/2014  Orientation to time 5 5 5 5   Orientation to Place 5 5 5 5   Registration 3 3 3 3   Attention/ Calculation 5 4 5 5   Recall 3 2 3 3   Language- name 2 objects 2 2 2 2   Language- repeat 1 1 1 1   Language- follow 3 step command 3 3 3 3   Language- read & follow direction 1 1 1 1   Write a sentence 1 1 0 1  Copy design 1 1 1 1   Total score 30 28 29 30      6CIT Screen 12/29/2019  What Year? 0 points  What month? 0 points  What time? 0 points  Count back from 20 0 points  Months in reverse 2 points  Repeat phrase 6 points  Total Score 8    Immunization History  Administered Date(s) Administered  . Influenza, High Dose Seasonal PF 03/25/2017, 03/25/2018  . Influenza,inj,quad, With Preservative 03/31/2016  . Influenza-Unspecified 04/29/2014, 03/02/2019  . PFIZER SARS-COV-2 Vaccination 08/07/2019, 08/28/2019  . Pneumococcal Conjugate-13 02/14/2016  . Pneumococcal-Unspecified 04/29/2014  . Zoster Recombinat (Shingrix) 12/18/2017, 03/01/2018    Qualifies for Shingles Vaccine? completed  Screening Tests Health Maintenance  Topic Date Due  . INFLUENZA VACCINE  02/14/2020  . COLONOSCOPY  10/18/2020  . TETANUS/TDAP  07/17/2023  .  COVID-19 Vaccine  Completed  . Hepatitis C Screening  Completed  . PNA vac Low Risk Adult  Completed   Cancer Screenings: Lung: Low Dose CT Chest recommended if Age 2-80 years, 30 pack-year currently smoking OR have quit w/in 15years. Patient does qualify. Yearly CT  done Colorectal: up to date, due 10/18/2020  Additional Screenings:  Hepatitis C Screening:done      Plan:     I have personally reviewed and noted the following in the patient's chart:   . Medical and social history . Use of alcohol, tobacco or illicit drugs  . Current medications and supplements . Functional ability and status . Nutritional status . Physical activity . Advanced directives . List of other physicians . Hospitalizations, surgeries, and ER visits in previous 12 months . Vitals . Screenings to include cognitive, depression, and falls . Referrals and appointments  In addition, I have reviewed and discussed with patient certain preventive protocols, quality metrics, and best practice recommendations. A written personalized care plan for preventive services as well as general preventive health recommendations were provided to patient.     [name], NP  12/29/2019

## 2019-12-29 NOTE — Telephone Encounter (Signed)
Mr. [name], persing are scheduled for a virtual visit with your provider today.    Just as we do with appointments in the office, we must obtain your consent to participate.  Your consent will be active for this visit and any virtual visit you may have with one of our providers in the next 365 days.    If you have a MyChart account, I can also send a copy of this consent to you electronically.  All virtual visits are billed to your insurance company just like a traditional visit in the office.  As this is a virtual visit, video technology does not allow for your provider to perform a traditional examination.  This may limit your provider's ability to fully assess your condition.  If your provider identifies any concerns that need to be evaluated in person or the need to arrange testing such as labs, EKG, etc, we will make arrangements to do so.    Although advances in technology are sophisticated, we cannot ensure that it will always work on either your end or our end.  If the connection with a video visit is poor, we may have to switch to a telephone visit.  With either a video or telephone visit, we are not always able to ensure that we have a secure connection.   I need to obtain your verbal consent now.   Are you willing to proceed with your visit today?   TREVEN HOLTMAN has provided verbal consent on 12/29/2019 for a virtual visit (video or telephone).   Carroll Kinds, CMA 12/29/2019  8:41 AM

## 2020-01-11 ENCOUNTER — Ambulatory Visit (INDEPENDENT_AMBULATORY_CARE_PROVIDER_SITE_OTHER): Payer: PPO | Admitting: Nurse Practitioner

## 2020-01-11 ENCOUNTER — Encounter: Payer: Self-pay | Admitting: Nurse Practitioner

## 2020-01-11 ENCOUNTER — Other Ambulatory Visit: Payer: Self-pay

## 2020-01-11 VITALS — BP 122/70 | HR 63 | Temp 97.5°F | Ht 67.0 in | Wt 214.0 lb

## 2020-01-11 DIAGNOSIS — I10 Essential (primary) hypertension: Secondary | ICD-10-CM

## 2020-01-11 DIAGNOSIS — R600 Localized edema: Secondary | ICD-10-CM | POA: Diagnosis not present

## 2020-01-11 DIAGNOSIS — Z7189 Other specified counseling: Secondary | ICD-10-CM

## 2020-01-11 LAB — BASIC METABOLIC PANEL WITH GFR
BUN: 23 mg/dL (ref 7–25)
CO2: 29 mmol/L (ref 20–32)
Calcium: 9.2 mg/dL (ref 8.6–10.3)
Chloride: 102 mmol/L (ref 98–110)
Creat: 0.86 mg/dL (ref 0.70–1.18)
GFR, Est African American: 98 mL/min/{1.73_m2} (ref >=60)
GFR, Est Non African American: 84 mL/min/{1.73_m2} (ref >=60)
Glucose, Bld: 105 mg/dL — ABNORMAL HIGH (ref 65–99)
Potassium: 4.1 mmol/L (ref 3.5–5.3)
Sodium: 139 mmol/L (ref 135–146)

## 2020-01-11 NOTE — Progress Notes (Signed)
Careteam: Patient Care Team: [name], NP as PCP - General (Nurse Practitioner) Druscilla Brownie, MD as Consulting Physician (Dermatology)  PLACE OF SERVICE:  [place] Directive information Does Patient Have a Medical Advance Directive?: No, Would patient like information on creating a medical advance directive?: No - Patient declined  Allergies  Allergen Reactions  . Asa [Aspirin] Anaphylaxis and Swelling    Angioedema/Eyes and lip swelling  . Nsaids Hives  . Tolmetin Hives  . Naproxen Hives    Chief Complaint  Patient presents with  . Medical Management of Chronic Issues    2 week follow up on BP     HPI: Patient is a 79 y.o. male for follow up on swelling to LE and htn. He was placed on HCTZ 2 weeks ago due to swelling to LE. He states swelling is MUCH better.  Tolerating medication without side effects.   To review MOST form and complete today  Review of Systems:  Review of Systems  Constitutional: Negative for chills, fever and weight loss.  Respiratory: Negative for cough, sputum production and shortness of breath.   Cardiovascular: Negative for chest pain, palpitations and leg swelling.  Gastrointestinal: Negative for heartburn.  Musculoskeletal: Negative for back pain, falls, joint pain and myalgias.  Skin: Negative.   Neurological: Negative for dizziness and headaches.  Psychiatric/Behavioral: Negative for depression and memory loss. The patient does not have insomnia.     Past Medical History:  Diagnosis Date  . A-fib ([place])   . Acute appendicitis with peritoneal abscess 01/28/2013  . Anxiety   . Arthritis   . Bacteremia September 2015  . Elevated PSA   . Essential hypertension 04/16/2014  . Heart murmur    childhood  . Ileus, postoperative ([place]) 01/28/2013  . Lung cancer ([place])   . Obesity, morbid ([place]) 01/28/2013  . Shortness of breath   . Tobacco use disorder 01/28/2013   Past Surgical History:  Procedure Laterality Date    . APPENDECTOMY    . [place], 2003   Dr.Apleton   . CARPAL TUNNEL RELEASE    . COLONOSCOPY     x 3  . EYE SURGERY Left    muscle of the eye   . KNEE SURGERY Bilateral   . LAPAROSCOPIC APPENDECTOMY N/A 01/25/2013   Procedure: APPENDECTOMY LAPAROSCOPIC;  Surgeon: Ralene Ok, MD;  Location: [facility];  Service: General;  Laterality: N/A;  . LYMPH NODE DISSECTION Left 04/26/2015   Procedure: LYMPH NODE DISSECTION;  Surgeon: Grace Isaac, MD;  Location: [facility];  Service: Thoracic;  Laterality: Left;  . SHOULDER SURGERY Right 2014   Dr.Whitfield rotator cuff surgery  . SKIN SURGERY Right 01/2019   Right Leg, cancerous area removed  . TEE WITHOUT CARDIOVERSION N/A 04/07/2014   Procedure: TRANSESOPHAGEAL ECHOCARDIOGRAM (TEE);  Surgeon: Candee Furbish, MD;  Location[facility];  Service: Cardiovascular;  Laterality: N/A;  . VIDEO ASSISTED THORACOSCOPY (VATS)/WEDGE RESECTION Left 04/26/2015   Procedure: LEFT VIDEO ASSISTED THORACOSCOPY (VATS) WITH LEFT LOWER LOBECTOMY;  Surgeon: Grace Isaac, MD;  Location: [facility];  Service: Thoracic;  Laterality: Left;  Marland Kitchen VIDEO BRONCHOSCOPY N/A 04/26/2015   Procedure: VIDEO BRONCHOSCOPY;  Surgeon: Grace Isaac, MD;  Location: [facility];  Service: Thoracic;  Laterality: N/A;   Social History:   reports that he has been smoking cigarettes and cigars. He started smoking about 61 years ago. He has a 30.00 pack-year smoking history. He quit smokeless tobacco use about 16  years ago.  His smokeless tobacco use included chew. He reports that he does not drink alcohol and does not use drugs.  Family History  Problem Relation Age of Onset  . Irritable bowel syndrome Mother   . Cancer Neg Hx   . Lung disease Neg Hx   . Colon cancer Neg Hx   . Colon polyps Neg Hx     Medications: Patient's Medications  New Prescriptions   No medications on file  Previous Medications   HYDRALAZINE (APRESOLINE) 25 MG TABLET    TAKE 1 TABLET BY MOUTH THREE TIMES  DAILY FOR BLOOD PRESSURE   HYDROCHLOROTHIAZIDE (HYDRODIURIL) 25 MG TABLET    Take 1 tablet (25 mg total) by mouth daily.   LOSARTAN (COZAAR) 50 MG TABLET    TAKE 1 TABLET BY MOUTH EVERY DAY FOR BLOOD PRESSURE   METOPROLOL TARTRATE (LOPRESSOR) 25 MG TABLET    TAKE 1/2 TABLET BY MOUTH TWICE DAILY   MULTIPLE VITAMIN (MULTIVITAMIN) TABLET    Take 1 tablet by mouth daily.  Modified Medications   No medications on file  Discontinued Medications   No medications on file    Physical Exam:  Vitals:   01/11/20 0829  BP: 122/70  Pulse: 63  Temp: (!) 97.5 F (36.4 C)  TempSrc: Temporal  SpO2: 97%  Weight: 214 lb (97.1 kg)  Height: 5\' 7"  (1.702 m)   Body mass index is 33.52 kg/m. Wt Readings from Last 3 Encounters:  01/11/20 214 lb (97.1 kg)  12/28/19 217 lb (98.4 kg)  08/31/19 217 lb (98.4 kg)    Physical Exam Constitutional:      General: He is not in acute distress.    Appearance: He is well-developed. He is not diaphoretic.  HENT:     Head: Normocephalic and atraumatic.  Cardiovascular:     Rate and Rhythm: Normal rate and regular rhythm.     Heart sounds: Normal heart sounds.  Pulmonary:     Effort: Pulmonary effort is normal.     Breath sounds: Normal breath sounds.  Musculoskeletal:     Right lower leg: No edema.     Left lower leg: No edema.  Skin:    General: Skin is warm and dry.  Neurological:     Mental Status: He is alert and oriented to person, place, and time.     Labs reviewed: Basic Metabolic Panel: Recent Labs    02/26/19 0803 08/28/19 0826  NA 142 140  K 4.9 4.3  CL 107 105  CO2 29 27  GLUCOSE 95 97  BUN 21 20  CREATININE 0.86 0.89  CALCIUM 9.4 9.4   Liver Function Tests: Recent Labs    02/26/19 0803  AST 21  ALT 16  BILITOT 0.6  PROT 7.1   No results for input(s): LIPASE, AMYLASE in the last 8760 hours. No results for input(s): AMMONIA in the last 8760 hours. CBC: Recent Labs    02/26/19 0803  WBC 7.7  NEUTROABS 5,221  HGB  17.1  HCT 50.6*  MCV 96.2  PLT 235   Lipid Panel: Recent Labs    02/26/19 0803 08/28/19 0826  CHOL 156 150  HDL 39* 42  LDLCALC 91 83  TRIG 154* 158*  CHOLHDL 4.0 3.6   TSH: No results for input(s): TSH in the last 8760 hours. A1C: Lab Results  Component Value Date   HGBA1C 5.2 02/26/2019     Assessment/Plan 1. Lower extremity edema Improvement with changes in diet and adding hctz. Will  continue at this time. - BASIC METABOLIC PANEL WITH GFR  2. Essential hypertension Stable on current regimen, to continue low sodium diet with medication.  - BASIC METABOLIC PANEL WITH GFR  3. Advance care planning -reviewed MOST form and completed with pt, all questions answered  - DNR (Do Not Resuscitate)  Next appt: 8/13/2021as scheduled.  Carlos American. [place], [facility] [phone]

## 2020-01-19 ENCOUNTER — Telehealth: Payer: Self-pay

## 2020-01-19 NOTE — Telephone Encounter (Signed)
The patient states with the HCTZ his swelling has gone down, but that he has to urinate so often that he feels like it is keeping him home and is affecting his quality of life. He wanted to know if this would be a long-term medication. I asked him what time of day he was taking the medication and he states he takes it "midday" and really likes to keep the same schedule.   He really wanted to talk to you and I explained the process we usually follow. He stated he understood, but still said he wanted you to call him. I again told him the process and that you answered messages quickly and that someone should respond by tomorrow a.m., if not earlier.

## 2020-01-19 NOTE — Telephone Encounter (Signed)
Generally yes it could be long term but if he would like to stop medication and only take for a few days if swelling gets worse I am okay with him doing that as well.

## 2020-01-19 NOTE — Telephone Encounter (Signed)
I called patient with the information provided by Janett Billow and he was very pleased that he didn't have to take long-term.

## 2020-01-24 ENCOUNTER — Other Ambulatory Visit: Payer: Self-pay | Admitting: Nurse Practitioner

## 2020-01-24 DIAGNOSIS — R600 Localized edema: Secondary | ICD-10-CM

## 2020-01-28 ENCOUNTER — Ambulatory Visit: Payer: PPO | Admitting: Cardiothoracic Surgery

## 2020-02-04 ENCOUNTER — Ambulatory Visit: Payer: PPO | Admitting: Cardiothoracic Surgery

## 2020-02-04 ENCOUNTER — Other Ambulatory Visit: Payer: Self-pay

## 2020-02-04 ENCOUNTER — Ambulatory Visit
Admission: RE | Admit: 2020-02-04 | Discharge: 2020-02-04 | Disposition: A | Discharge status: Home / Self Care | Payer: PPO | Source: Ambulatory Visit | Attending: Cardiothoracic Surgery | Admitting: Cardiothoracic Surgery

## 2020-02-04 VITALS — BP 130/85 | HR 70 | Temp 97.9°F | Resp 20 | Ht 67.0 in | Wt 219.0 lb

## 2020-02-04 DIAGNOSIS — Z85118 Personal history of other malignant neoplasm of bronchus and lung: Secondary | ICD-10-CM

## 2020-02-04 DIAGNOSIS — C3490 Malignant neoplasm of unspecified part of unspecified bronchus or lung: Secondary | ICD-10-CM | POA: Diagnosis not present

## 2020-02-04 DIAGNOSIS — I2510 Atherosclerotic heart disease of native coronary artery without angina pectoris: Secondary | ICD-10-CM | POA: Diagnosis not present

## 2020-02-04 DIAGNOSIS — J90 Pleural effusion, not elsewhere classified: Secondary | ICD-10-CM | POA: Diagnosis not present

## 2020-02-04 DIAGNOSIS — I700 Atherosclerosis of aorta: Secondary | ICD-10-CM | POA: Diagnosis not present

## 2020-02-04 NOTE — Progress Notes (Signed)
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 PROCEDURE: Left lower lobectomy and lymph node dissection, video bronchoscopy, left video-assisted thoracoscopy, and placement of On-Q. 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  The lesion was a rapidly growing process from a negative she CT scan in January 2016.    Patient denies any significant cough or hemoptysis.  He has gotten 2 Covid vaccinations    Past Medical History:  Diagnosis Date  . A-fib ([place])   . Acute appendicitis with peritoneal abscess 01/28/2013  . Anxiety   . Arthritis   . Bacteremia September 2015  . Elevated PSA   . Essential hypertension 04/16/2014  . Heart murmur    childhood  . Ileus, postoperative ([place]) 01/28/2013  . Lung cancer ([place])   . Obesity, morbid ([place]) 01/28/2013  . Shortness of breath   . Tobacco use disorder 01/28/2013     Social History   Tobacco Use  Smoking Status Current Every Day Smoker  . Packs/day: 0.50  . Years: 60.00  . Pack years: 30.00  . Types: Cigarettes, Cigars  . Start date: 07/16/1958  Smokeless Tobacco Former Systems developer  . Types: Chew  . Quit date: 07/17/2003    Social History   Substance and Sexual Activity  Alcohol Use No  .  Alcohol/week: 0.0 standard drinks     Allergies  Allergen Reactions  . Asa [Aspirin] Anaphylaxis and Swelling    Angioedema/Eyes and lip swelling  . Nsaids Hives  . Tolmetin Hives  . Naproxen Hives    Current Outpatient Medications  Medication Sig Dispense Refill  . hydrochlorothiazide (HYDRODIURIL) 25 MG tablet TAKE 1 TABLET(25 MG) BY MOUTH DAILY 90 tablet 1  . hydrALAZINE (APRESOLINE) 25 MG tablet TAKE 1 TABLET BY MOUTH THREE TIMES DAILY FOR BLOOD PRESSURE 270 tablet 1  . losartan (COZAAR) 50 MG tablet TAKE 1 TABLET BY MOUTH EVERY DAY FOR BLOOD PRESSURE 90 tablet 1  . metoprolol tartrate (LOPRESSOR) 25 MG tablet TAKE 1/2 TABLET BY MOUTH TWICE DAILY 90 tablet 1  . Multiple Vitamin (MULTIVITAMIN) tablet Take 1 tablet by mouth daily.     No current facility-administered medications for this visit.       Physical Exam: BP (!) 130/85   Pulse 70   Temp 97.9 F (36.6 C) (Skin)   Resp 20   Ht 5\' 7"  (1.702 m)   Wt (!) 219 lb (99.3 kg)   SpO2 95% Comment: RA  BMI 34.30 kg/m  General appearance: alert, cooperative and no distress Neck: no adenopathy, no carotid bruit, no JVD, supple, symmetrical, trachea midline and thyroid not enlarged, symmetric, no tenderness/mass/nodules Lymph nodes: Cervical, supraclavicular, and axillary nodes normal. Resp:  clear to auscultation bilaterally Cardio: regular rate and rhythm, S1, S2 normal, no murmur, click, rub or gallop GI: soft, non-tender; bowel sounds normal; no masses,  no organomegaly Extremities: extremities normal, atraumatic, no cyanosis or edema and Homans sign is negative, no sign of DVT Neurologic: Grossly normal  Diagnostic Studies & Laboratory data:     Recent Radiology Findings:  CT CHEST WO CONTRAST  Result Date: 02/04/2020 CLINICAL DATA:  Follow-up lung cancer, status post left lower lobectomy EXAM: CT CHEST WITHOUT CONTRAST TECHNIQUE: Multidetector CT imaging of the chest was performed following the standard protocol  without IV contrast. COMPARISON:  02/05/2019, 01/09/2018, 04/18/2017 FINDINGS: Cardiovascular: Aortic atherosclerosis. Normal heart size. Three-vessel coronary artery calcifications. No pericardial effusion. Mediastinum/Nodes: No enlarged mediastinal, hilar, or axillary lymph nodes. Thyroid gland, trachea, and esophagus demonstrate no significant findings. Lungs/Pleura: Redemonstrated postoperative findings of left lower lobectomy. Unchanged small, chronic loculated left pleural effusion. Diffuse bilateral bronchial wall thickening. Innumerable tiny centrilobular pulmonary nodules. Upper Abdomen: No acute abnormality. Musculoskeletal: No chest wall mass or suspicious bone lesions identified. Subcutaneous inclusion cysts of the posterior right chest wall (series 2, image 54). IMPRESSION: 1. Redemonstrated postoperative findings of left lower lobectomy. Unchanged small, chronic loculated left pleural effusion. No evidence of malignant recurrence. 2. Diffuse bilateral bronchial wall thickening and innumerable tiny centrilobular pulmonary nodules, most consistent with smoking-related respiratory bronchiolitis and bronchitis. 3. Coronary artery disease.  Aortic Atherosclerosis (ICD10-I70.0). Electronically Signed   By: Eddie Candle M.D.   On: 02/04/2020 10:31     I have independently reviewed the above radiology studies  and reviewed the findings with the patient.  Ct Chest Wo Contrast  Result Date: 01/09/2018 CLINICAL DATA:  Status post left lower lobectomy 04/26/2015. wedge resection for non-small-cell lung cancer. Current smoker. Occasional shortness of breath. EXAM: CT CHEST WITHOUT CONTRAST TECHNIQUE: Multidetector CT imaging of the chest was performed following the standard protocol without IV contrast. COMPARISON:  04/18/2017 FINDINGS: Cardiovascular: Aortic and branch vessel atherosclerosis. Tortuous thoracic aorta. Normal heart size, without pericardial effusion. Multivessel coronary artery  atherosclerosis. Pulmonary artery enlargement, outflow tract 3.3 cm. Mediastinum/Nodes: No supraclavicular adenopathy. No mediastinal or definite hilar adenopathy, given limitations of unenhanced CT. Lungs/Pleura: Left-sided pleural thickening and trace inferior left pleural fluid are similar. Status post left lower lobectomy. Moderate bullous type emphysema. Secondary left lung base volume loss and scarring. A likely calcified lateral left lung base 2 mm nodule on image 87/8 is not significantly changed. Upper Abdomen: Left hepatic lobe cyst. Normal imaged portions of the spleen, stomach, pancreas, adrenal glands, left kidney. Abdominal aortic atherosclerosis. Musculoskeletal: Probable sebaceous cysts about the right posterior chest wall. The largest lesion has likely undergone interval draining, including on image 72/2. moderate thoracic spondylosis with accentuation of expected thoracic kyphosis. IMPRESSION: 1. Left lower lobectomy, without findings of recurrent or metastatic disease. 2. Aortic atherosclerosis (ICD10-I70.0), coronary artery atherosclerosis and emphysema (ICD10-J43.9). 3. Pulmonary artery enlargement suggests pulmonary arterial hypertension. Electronically Signed   By: Abigail Miyamoto M.D.   On: 01/09/2018 13:59   Ct Chest Wo Contrast  Result Date: 04/18/2017 CLINICAL DATA:  Restaging small cell lung cancer. EXAM: CT CHEST WITHOUT CONTRAST TECHNIQUE: Multidetector CT imaging of the chest was performed following the standard protocol without IV contrast. COMPARISON:  CT scan 09/13/2016 FINDINGS: Cardiovascular: The heart is normal in size. No pericardial effusion. Mild tortuosity and calcification of the thoracic aorta is stable. Stable coronary artery calcifications. Mediastinum/Nodes: No mediastinal or hilar mass or lymphadenopathy. Small scattered lymph nodes are stable. The  esophagus is grossly normal. Lungs/Pleura: Stable surgical changes from a left lower lobe lobectomy. No findings  suspicious for recurrent tumor. No metastatic pulmonary nodules or acute pulmonary findings. Chronic left basilar pleural effusion with overlying scarring changes. Upper Abdomen: No significant upper abdominal findings. Stable hepatic cysts. No adrenal gland lesions. Musculoskeletal: Stable subcutaneous nodules involving the posterior chest wall. No supraclavicular or axillary adenopathy. No significant bony findings. IMPRESSION: 1. Stable CT appearance of the chest. Postsurgical changes noted from a left lower lobe lobectomy without CT findings for recurrent tumor, mediastinal/hilar adenopathy or metastatic pulmonary nodules. 2. No findings to suggest metastatic disease involving upper abdomen or bony structures. 3. Stable atherosclerotic calcifications involving the aorta and coronary arteries. Aortic Atherosclerosis (ICD10-I70.0) and Emphysema (ICD10-J43.9). Electronically Signed   By: Marijo Sanes M.D.   On: 04/18/2017 16:20   Ct Chest Wo Contrast  Result Date: 09/13/2016 CLINICAL DATA:  Status post left lower lobectomy 04/26/2015 for sarcomatoid lung carcinoma, presenting for restaging. Current smoker. EXAM: CT CHEST WITHOUT CONTRAST TECHNIQUE: Multidetector CT imaging of the chest was performed following the standard protocol without IV contrast. COMPARISON:  02/23/2016 chest CT. FINDINGS: Cardiovascular: Normal heart size. No significant pericardial fluid/thickening. Left anterior descending, left circumflex and right coronary atherosclerosis. Atherosclerotic nonaneurysmal thoracic aorta. Stable top-normal caliber main pulmonary artery (3.1 cm diameter). Mediastinum/Nodes: No discrete thyroid nodules. Unremarkable esophagus. No pathologically enlarged axillary, mediastinal or gross hilar lymph nodes, noting limited sensitivity for the detection of hilar adenopathy on this noncontrast study. Lungs/Pleura: No pneumothorax. No right pleural effusion. Status post left lower lobectomy. Tiny loculated dependent  basilar left pleural effusion is decreased. Tiny peripheral right upper lobe 3 mm pulmonary nodule (series 4/ image 38) is stable back to 09/26/2015 and considered benign. Stable small parenchymal bands at the left lung base compatible with mild postinfectious scarring. Stable 2 mm calcified granuloma in the peripheral left upper lobe. No acute consolidative airspace disease, lung masses or new significant pulmonary nodules. Upper abdomen: Simple 1.9 cm left liver lobe cyst. Musculoskeletal: No aggressive appearing focal osseous lesions. Moderate thoracic spondylosis. No appreciable change and circumscribed superficial subcutaneous nodules in the bilateral back of variable density, largest 4.0 cm in the right back, most compatible with sebaceous cysts. IMPRESSION: 1. No evidence of local tumor recurrence in the left lung status post left lower lobectomy. 2. No evidence of metastatic disease in the chest. 3. Continued reduction in size of tiny loculated basilar left pleural effusion. 4. Aortic atherosclerosis.  Three-vessel coronary atherosclerosis. Electronically Signed   By: Ilona Sorrel M.D.   On: 09/13/2016 15:30   Ct Chest Wo Contrast  Result Date: 02/23/2016 CLINICAL DATA:  History of lung cancer status post left lower lobe wedge resection 04/26/2015. EXAM: CT CHEST WITHOUT CONTRAST TECHNIQUE: Multidetector CT imaging of the chest was performed following the standard protocol without IV contrast. COMPARISON:  Chest CT 09/26/2015 and PET CT 03/07/2015. FINDINGS: Cardiovascular: Atherosclerosis of aorta, great vessels and coronary arteries again noted. No acute vascular findings are demonstrated on noncontrast imaging. The heart size is normal. There is no pericardial effusion. Mediastinum/Nodes: Small mediastinal lymph nodes are unchanged. Hilar assessment is limited by the lack of intravenous contrast, although the hilar contours appear unchanged. The thyroid gland, trachea and esophagus demonstrate no  significant findings. Lungs/Pleura: Small left pleural effusion has decreased in volume. There is no significant pleural fluid on the right. Stable postsurgical changes status post left lower lobe resection. There is a stable calcified lingular granuloma. There is a stable  small perifissural nodule along the minor fissure on image 63. There is a stable tiny right upper lobe nodule on image 34. No suspicious pulmonary nodules. Upper abdomen: The visualized upper abdomen appears stable without suspicious findings. Low-density hepatic lesion is unchanged, likely a cyst. Musculoskeletal/Chest wall: There is no chest wall mass or suspicious osseous finding. Thoracotomy defects noted on the left. Several probable epidermal inclusion (sebaceous) cysts again noted posteriorly in the right chest wall. IMPRESSION: 1. Interval improvement in small loculated left pleural effusion following left lower lobe resection. 2. No evidence of local recurrence or metastatic disease. 3. Atherosclerosis, as before. Electronically Signed   By: Richardean Sale M.D.   On: 02/23/2016 11:52     Ct Chest Wo Contrast  09/26/2015  CLINICAL DATA:  Left lower lobe lung cancer and resection. Weight loss. EXAM: CT CHEST WITHOUT CONTRAST TECHNIQUE: Multidetector CT imaging of the chest was performed following the standard protocol without IV contrast. COMPARISON:  PET 03/07/2015 and CT chest 08/05/2014. FINDINGS: Mediastinum/Nodes: Mediastinal lymph nodes are not enlarged by CT size criteria. Hilar regions are difficult to definitively evaluate without IV contrast. No axillary adenopathy. Atherosclerotic calcification of the arterial vasculature, including three-vessel involvement of the coronary arteries. Heart size normal. No pericardial effusion. Lungs/Pleura: Postoperative changes of left lower lobectomy with a small left pleural effusion. Minimal linear volume loss in the left upper lobe. Calcified granuloma in the lingula. 4 mm right upper  lobe nodule (4/19), unchanged. Airway is otherwise unremarkable. Upper abdomen: Low-attenuation lesion in the dome of the liver measures 1.8 cm, as before. Visualized portions of the liver, gallbladder, adrenal glands, kidneys, spleen, pancreas, stomach and bowel are otherwise grossly unremarkable. No upper abdominal adenopathy. Musculoskeletal: No worrisome lytic or sclerotic lesions. Degenerative changes are seen in the spine. Thoracotomy changes on the left. Probable subcutaneous sebaceous cysts overlying the right scapula and posterior right ribs. IMPRESSION: 1. Postoperative changes of left lower lobectomy without evidence of recurrent or metastatic disease. 2. Small left pleural effusion. 3. Three-vessel coronary artery calcification. Electronically Signed   By: Lorin Picket M.D.   On: 09/26/2015 14:18      Recent Lab Findings: Lab Results  Component Value Date   WBC 7.7 02/26/2019   HGB 17.1 02/26/2019   HCT 50.6 (H) 02/26/2019   PLT 235 02/26/2019   GLUCOSE 105 (H) 01/11/2020   CHOL 150 08/28/2019   TRIG 158 (H) 08/28/2019   HDL 42 08/28/2019   LDLCALC 83 08/28/2019   ALT 16 02/26/2019   AST 21 02/26/2019   NA 139 01/11/2020   K 4.1 01/11/2020   CL 102 01/11/2020   CREATININE 0.86 01/11/2020   BUN 23 01/11/2020   CO2 29 01/11/2020   TSH 1.01 02/08/2016   INR 1.00 04/20/2015   HGBA1C 5.2 02/26/2019   Cancer Staging No matching staging information was found for the patient.     Assessment / Plan:   #1 patient is now  49 months years following resection of left lower lobe for sarcomatoid carcinoma of the lung-CT scan done today shows no evidence of recurrence  #2 patient status post Covid vaccination  Plan to see him back in 1 year for a final CT of the chest for follow-up after resection of a sarcomatous carcinoma of the lung     Grace Isaac MD      [address] [place] [address] Office [phone]   [place] [phone]  02/04/2020  11:15 AM

## 2020-02-22 ENCOUNTER — Other Ambulatory Visit: Payer: Self-pay

## 2020-02-22 DIAGNOSIS — D696 Thrombocytopenia, unspecified: Secondary | ICD-10-CM

## 2020-02-22 DIAGNOSIS — I480 Paroxysmal atrial fibrillation: Secondary | ICD-10-CM

## 2020-02-22 DIAGNOSIS — E782 Mixed hyperlipidemia: Secondary | ICD-10-CM

## 2020-02-26 ENCOUNTER — Other Ambulatory Visit: Payer: PPO

## 2020-02-26 DIAGNOSIS — E782 Mixed hyperlipidemia: Secondary | ICD-10-CM | POA: Diagnosis not present

## 2020-02-26 DIAGNOSIS — I480 Paroxysmal atrial fibrillation: Secondary | ICD-10-CM | POA: Diagnosis not present

## 2020-02-26 DIAGNOSIS — D696 Thrombocytopenia, unspecified: Secondary | ICD-10-CM | POA: Diagnosis not present

## 2020-02-26 LAB — COMPLETE METABOLIC PANEL WITH GFR
AG Ratio: 1.5 (calc) (ref 1.0–2.5)
ALT: 17 U/L (ref 9–46)
AST: 20 U/L (ref 10–35)
Albumin: 4.3 g/dL (ref 3.6–5.1)
Alkaline phosphatase (APISO): 66 U/L (ref 35–144)
BUN: 21 mg/dL (ref 7–25)
CO2: 27 mmol/L (ref 20–32)
Calcium: 9.3 mg/dL (ref 8.6–10.3)
Chloride: 104 mmol/L (ref 98–110)
Creat: 0.94 mg/dL (ref 0.70–1.18)
GFR, Est African American: 91 mL/min/{1.73_m2} (ref >=60)
GFR, Est Non African American: 78 mL/min/{1.73_m2} (ref >=60)
Globulin: 2.8 g/dL (calc) (ref 1.9–3.7)
Glucose, Bld: 92 mg/dL (ref 65–99)
Potassium: 4.3 mmol/L (ref 3.5–5.3)
Sodium: 141 mmol/L (ref 135–146)
Total Bilirubin: 0.8 mg/dL (ref 0.2–1.2)
Total Protein: 7.1 g/dL (ref 6.1–8.1)

## 2020-02-26 LAB — LIPID PANEL
Cholesterol: 155 mg/dL (ref <200)
HDL: 42 mg/dL (ref >=40)
LDL Cholesterol (Calc): 86 mg/dL (calc)
Non-HDL Cholesterol (Calc): 113 mg/dL (calc) (ref <130)
Total CHOL/HDL Ratio: 3.7 (calc) (ref <5.0)
Triglycerides: 173 mg/dL — ABNORMAL HIGH (ref <150)

## 2020-02-26 LAB — CBC WITH DIFFERENTIAL/PLATELET
Absolute Monocytes: 636 cells/uL (ref 200–950)
Basophils Absolute: 26 cells/uL (ref 0–200)
Basophils Relative: 0.3 %
Eosinophils Absolute: 138 cells/uL (ref 15–500)
Eosinophils Relative: 1.6 %
HCT: 52 % — ABNORMAL HIGH (ref 38.5–50.0)
Hemoglobin: 17.5 g/dL — ABNORMAL HIGH (ref 13.2–17.1)
Lymphs Abs: 1789 cells/uL (ref 850–3900)
MCH: 33 pg (ref 27.0–33.0)
MCHC: 33.7 g/dL (ref 32.0–36.0)
MCV: 97.9 fL (ref 80.0–100.0)
MPV: 10.7 fL (ref 7.5–12.5)
Monocytes Relative: 7.4 %
Neutro Abs: 6011 cells/uL (ref 1500–7800)
Neutrophils Relative %: 69.9 %
Platelets: 232 10*3/uL (ref 140–400)
RBC: 5.31 10*6/uL (ref 4.20–5.80)
RDW: 13.4 % (ref 11.0–15.0)
Total Lymphocyte: 20.8 %
WBC: 8.6 10*3/uL (ref 3.8–10.8)

## 2020-02-29 ENCOUNTER — Encounter: Payer: Self-pay | Admitting: Nurse Practitioner

## 2020-02-29 ENCOUNTER — Ambulatory Visit (INDEPENDENT_AMBULATORY_CARE_PROVIDER_SITE_OTHER): Payer: PPO | Admitting: Nurse Practitioner

## 2020-02-29 ENCOUNTER — Other Ambulatory Visit: Payer: Self-pay

## 2020-02-29 VITALS — BP 138/70 | HR 68 | Temp 96.9°F | Ht 67.0 in | Wt 221.0 lb

## 2020-02-29 DIAGNOSIS — I10 Essential (primary) hypertension: Secondary | ICD-10-CM

## 2020-02-29 DIAGNOSIS — E782 Mixed hyperlipidemia: Secondary | ICD-10-CM | POA: Diagnosis not present

## 2020-02-29 DIAGNOSIS — R972 Elevated prostate specific antigen [PSA]: Secondary | ICD-10-CM | POA: Diagnosis not present

## 2020-02-29 DIAGNOSIS — R600 Localized edema: Secondary | ICD-10-CM | POA: Diagnosis not present

## 2020-02-29 DIAGNOSIS — D696 Thrombocytopenia, unspecified: Secondary | ICD-10-CM | POA: Diagnosis not present

## 2020-02-29 DIAGNOSIS — F17200 Nicotine dependence, unspecified, uncomplicated: Secondary | ICD-10-CM | POA: Diagnosis not present

## 2020-02-29 NOTE — Progress Notes (Signed)
Careteam: Patient Care Team: [name], NP as PCP - General (Nurse Practitioner) Druscilla Brownie, MD as Consulting Physician (Dermatology)  PLACE OF SERVICE:  [place] Directive information Does Patient Have a Medical Advance Directive?: Yes, Type of Advance Directive: Out of facility DNR (pink MOST or yellow form), Pre-existing out of facility DNR order (yellow form or pink MOST form): Yellow form placed in chart (order not valid for inpatient use);Pink MOST form placed in chart (order not valid for inpatient use), Does patient want to make changes to medical advance directive?: No - Patient declined  Allergies  Allergen Reactions  . Asa [Aspirin] Anaphylaxis and Swelling    Angioedema/Eyes and lip swelling  . Nsaids Hives  . Tolmetin Hives  . Naproxen Hives    Chief Complaint  Patient presents with  . Medical Management of Chronic Issues    6 month follow-up and discuss labs (copy pribnted)   . Immunizations    Flu vacccine not in stock, patient aware he can schedule a nurse visit or get a local pharmacy   . Advanced Directive    Review DNR and MOST form, patient would like to clarify wishes   . Covid 19 Vaccine    Discuss if patient qualifies for 3rd dose of covid 19 vaccine      HPI: Patient is a 79 y.o. male for routine follow up  Would like to keep MOST and DNR on file as is  Elevated triglycerides on labs- went and got ice cream prior to test. Loves cheese. But back on cheese and icecream   LE edema- stopped HCTZ due to increase in urination, swelling improved and has not gotten back since  htn- controlled.   Continues to smoke-no worsening shortness of breath, cough or congestion.  Recently saw cardiothoracic surgery due to hx of lung cancer- 5 years this October- wants to follow up 1 more time in a year.    Review of Systems:  Review of Systems  Constitutional: Negative for chills, fever and weight loss.  HENT: Negative for  tinnitus.   Respiratory: Negative for cough, sputum production and shortness of breath.   Cardiovascular: Negative for chest pain, palpitations and leg swelling.  Gastrointestinal: Negative for abdominal pain, constipation, diarrhea and heartburn.       Occasionally will have bout with diarrhea, does not last longer than a day and does not happen often  Genitourinary: Negative for dysuria, frequency and urgency.  Musculoskeletal: Negative for back pain, falls, joint pain and myalgias.  Skin: Negative.   Neurological: Negative for dizziness and headaches.  Psychiatric/Behavioral: Negative for depression and memory loss. The patient does not have insomnia.     Past Medical History:  Diagnosis Date  . A-fib ([place])   . Acute appendicitis with peritoneal abscess 01/28/2013  . Anxiety   . Arthritis   . Bacteremia September 2015  . Elevated PSA   . Essential hypertension 04/16/2014  . Heart murmur    childhood  . Ileus, postoperative ([place]) 01/28/2013  . Lung cancer ([place])   . Obesity, morbid ([place]) 01/28/2013  . Shortness of breath   . Tobacco use disorder 01/28/2013   Past Surgical History:  Procedure Laterality Date  . APPENDECTOMY    . [place], 2003   Dr.Apleton   . CARPAL TUNNEL RELEASE    . COLONOSCOPY     x 3  . EYE SURGERY Left    muscle of the eye   . KNEE  SURGERY Bilateral   . LAPAROSCOPIC APPENDECTOMY N/A 01/25/2013   Procedure: APPENDECTOMY LAPAROSCOPIC;  Surgeon: Ralene Ok, MD;  Location: [place];  Service: General;  Laterality: N/A;  . LYMPH NODE DISSECTION Left 04/26/2015   Procedure: LYMPH NODE DISSECTION;  Surgeon: Grace Isaac, MD;  Location: [facility];  Service: Thoracic;  Laterality: Left;  . SHOULDER SURGERY Right 2014   Dr.Whitfield rotator cuff surgery  . SKIN SURGERY Right 01/2019   Right Leg, cancerous area removed  . TEE WITHOUT CARDIOVERSION N/A 04/07/2014   Procedure: TRANSESOPHAGEAL ECHOCARDIOGRAM (TEE);  Surgeon: Candee Furbish, MD;  Location:  [facility] ENDOSCOPY;  Service: Cardiovascular;  Laterality: N/A;  . VIDEO ASSISTED THORACOSCOPY (VATS)/WEDGE RESECTION Left 04/26/2015   Procedure: LEFT VIDEO ASSISTED THORACOSCOPY (VATS) WITH LEFT LOWER LOBECTOMY;  Surgeon: Grace Isaac, MD;  Location: [place];  Service: Thoracic;  Laterality: Left;  Marland Kitchen VIDEO BRONCHOSCOPY N/A 04/26/2015   Procedure: VIDEO BRONCHOSCOPY;  Surgeon: Grace Isaac, MD;  Location: [place];  Service: Thoracic;  Laterality: N/A;   Social History:   reports that he has been smoking cigarettes and cigars. He started smoking about 61 years ago. He has a 30.00 pack-year smoking history. He quit smokeless tobacco use about 16 years ago.  His smokeless tobacco use included chew. He reports that he does not drink alcohol and does not use drugs.  Family History  Problem Relation Age of Onset  . Irritable bowel syndrome Mother   . Cancer Neg Hx   . Lung disease Neg Hx   . Colon cancer Neg Hx   . Colon polyps Neg Hx     Medications: Patient's Medications  New Prescriptions   No medications on file  Previous Medications   HYDRALAZINE (APRESOLINE) 25 MG TABLET    TAKE 1 TABLET BY MOUTH THREE TIMES DAILY FOR BLOOD PRESSURE   LOSARTAN (COZAAR) 50 MG TABLET    TAKE 1 TABLET BY MOUTH EVERY DAY FOR BLOOD PRESSURE   METOPROLOL TARTRATE (LOPRESSOR) 25 MG TABLET    TAKE 1/2 TABLET BY MOUTH TWICE DAILY   MULTIPLE VITAMIN (MULTIVITAMIN) TABLET    Take 1 tablet by mouth daily.  Modified Medications   No medications on file  Discontinued Medications   HYDROCHLOROTHIAZIDE (HYDRODIURIL) 25 MG TABLET    TAKE 1 TABLET(25 MG) BY MOUTH DAILY    Physical Exam:  Vitals:   02/29/20 1001  BP: 138/70  Pulse: 68  Temp: (!) 96.9 F (36.1 C)  TempSrc: Temporal  SpO2: 96%  Weight: 221 lb (100.2 kg)  Height: 5\' 7"  (1.702 m)   Body mass index is 34.61 kg/m. Wt Readings from Last 3 Encounters:  02/29/20 221 lb (100.2 kg)  02/04/20 (!) 219 lb (99.3 kg)  01/11/20 214 lb (97.1 kg)     Physical Exam Constitutional:      General: He is not in acute distress.    Appearance: He is well-developed. He is not diaphoretic.  HENT:     Head: Normocephalic and atraumatic.     Mouth/Throat:     Pharynx: No oropharyngeal exudate.  Eyes:     Conjunctiva/sclera: Conjunctivae normal.     Pupils: Pupils are equal, round, and reactive to light.  Cardiovascular:     Rate and Rhythm: Normal rate and regular rhythm.     Heart sounds: Normal heart sounds.  Pulmonary:     Effort: Pulmonary effort is normal.     Breath sounds: Normal breath sounds.  Abdominal:     General: Bowel sounds  are normal.     Palpations: Abdomen is soft.  Musculoskeletal:        General: No tenderness.     Cervical back: Normal range of motion and neck supple.  Skin:    General: Skin is warm and dry.  Neurological:     Mental Status: He is alert and oriented to person, place, and time.    Labs reviewed: Basic Metabolic Panel: Recent Labs    08/28/19 0826 01/11/20 0852 02/26/20 0944  NA 140 139 141  K 4.3 4.1 4.3  CL 105 102 104  CO2 27 29 27   GLUCOSE 97 105* 92  BUN 20 23 21   CREATININE 0.89 0.86 0.94  CALCIUM 9.4 9.2 9.3   Liver Function Tests: Recent Labs    02/26/20 0944  AST 20  ALT 17  BILITOT 0.8  PROT 7.1   No results for input(s): LIPASE, AMYLASE in the last 8760 hours. No results for input(s): AMMONIA in the last 8760 hours. CBC: Recent Labs    02/26/20 0944  WBC 8.6  NEUTROABS 6,011  HGB 17.5*  HCT 52.0*  MCV 97.9  PLT 232   Lipid Panel: Recent Labs    08/28/19 0826 02/26/20 0944  CHOL 150 155  HDL 42 42  LDLCALC 83 86  TRIG 158* 173*  CHOLHDL 3.6 3.7   TSH: No results for input(s): TSH in the last 8760 hours. A1C: Lab Results  Component Value Date   HGBA1C 5.2 02/26/2019     Assessment/Plan 1. Essential hypertension -stable on metoprolol BID, losartan and hydralazine 25 mg TID  - CBC with Differential/Platelet; Future - BASIC METABOLIC  PANEL WITH GFR; Future  2. Tobacco use disorder Encouraged smoking cessation.  3. Thrombocytopenia, unspecified ([place]) Stable on recent labs.  - CBC with Differential/Platelet; Future  4. Lower extremity edema -improved when on HCTZ, stopped medication and has remained stable.   5. Elevated PSA No changes in urinary frequency or flow.  - PSA; Future  6. Elevated triglycerides with high cholesterol -discussed dietary modifications. - Lipid Panel; Future - BASIC METABOLIC PANEL WITH GFR; Future  Next appt: 6 months with labs prior.  Carlos American. [place], [facility] [phone]

## 2020-02-29 NOTE — Patient Instructions (Addendum)
Quit smoking  Get flu vaccine at pharmacy or come back to office in 2 weeks (call to make appt)   High Triglycerides Eating Plan Triglycerides are a type of fat in the blood. High levels of triglycerides can increase your risk of heart disease and stroke. If your triglyceride levels are high, choosing the right foods can help lower your triglycerides and keep your heart healthy. Work with your health care provider or a diet and nutrition specialist (dietitian) to develop an eating plan that is right for you. What are tips for following this plan? General guidelines   Lose weight, if you are overweight. For most people, losing 5-10 lbs (2-5 kg) helps lower triglyceride levels. A weight-loss plan may include. ? 30 minutes of exercise at least 5 days a week. ? Reducing the amount of calories, sugar, and fat you eat.  Eat a wide variety of fresh fruits, vegetables, and whole grains. These foods are high in fiber.  Eat foods that contain healthy fats, such as fatty fish, nuts, seeds, and olive oil.  Avoid foods that are high in added sugar, added salt (sodium), saturated fat, and trans fat.  Avoid low-fiber, refined carbohydrates such as white bread, crackers, noodles, and white rice.  Avoid foods with partially hydrogenated oils (trans fats), such as fried foods or stick margarine.  Limit alcohol intake to no more than 1 drink a day for nonpregnant women and 2 drinks a day for men. One drink equals 12 oz of beer, 5 oz of wine, or 1 oz of hard liquor. Your health care provider may recommend that you drink less depending on your overall health. Reading food labels  Check food labels for the amount of saturated fat. Choose foods with no or very little saturated fat.  Check food labels for the amount of trans fat. Choose foods with no trans fat.  Check food labels for the amount of cholesterol. Choose foods low in cholesterol. Ask your dietitian how much cholesterol you should have each  day.  Check food labels for the amount of sodium. Choose foods with less than 140 milligrams (mg) per serving. Shopping  Buy dairy products labeled as nonfat (skim) or low-fat (1%).  Avoid buying processed or prepackaged foods. These are often high in added sugar, sodium, and fat. Cooking  Choose healthy fats when cooking, such as olive oil or canola oil.  Cook foods using lower fat methods, such as baking, broiling, boiling, or grilling.  Make your own sauces, dressings, and marinades when possible, instead of buying them. Store-bought sauces, dressings, and marinades are often high in sodium and sugar. Meal planning  Eat more home-cooked food and less restaurant, buffet, and fast food.  Eat fatty fish at least 2 times each week. Examples of fatty fish include salmon, trout, mackerel, tuna, and herring.  If you eat whole eggs, do not eat more than 3 egg yolks per week. What foods are recommended? The items listed may not be a complete list. Talk with your dietitian about what dietary choices are best for you. Grains Whole wheat or whole grain breads, crackers, cereals, and pasta. Unsweetened oatmeal. Bulgur. Barley. Quinoa. Brown rice. Whole wheat flour tortillas. Vegetables Fresh or frozen vegetables. Low-sodium canned vegetables. Fruits All fresh, canned (in natural juice), or frozen fruits. Meats and other protein foods Skinless chicken or [place]. Ground chicken or [place]. Lean cuts of pork, trimmed of fat. Fish and seafood, especially salmon, trout, and herring. Egg whites. Dried beans, peas, or lentils. Unsalted  nuts or seeds. Unsalted canned beans. Natural peanut or almond butter. Dairy Low-fat dairy products. Skim or low-fat (1%) milk. Reduced fat (2%) and low-sodium cheese. Low-fat ricotta cheese. Low-fat cottage cheese. Plain, low-fat yogurt. Fats and oils Tub margarine without trans fats. Light or reduced-fat mayonnaise. Light or reduced-fat salad dressings. Avocado.  Safflower, olive, sunflower, soybean, and canola oils. What foods are not recommended? The items listed may not be a complete list. Talk with your dietitian about what dietary choices are best for you. Grains White bread. White (regular) pasta. White rice. Cornbread. Bagels. Pastries. Crackers that contain trans fat. Vegetables Creamed or fried vegetables. Vegetables in a cheese sauce. Fruits Sweetened dried fruit. Canned fruit in syrup. Fruit juice. Meats and other protein foods Fatty cuts of meat. Ribs. Chicken wings. Berniece Salines. Sausage. Bologna. Salami. Chitterlings. Fatback. Hot dogs. Bratwurst. Packaged lunch meats. Dairy Whole or reduced-fat (2%) milk. Half-and-half. Cream cheese. Full-fat or sweetened yogurt. Full-fat cheese. Nondairy creamers. Whipped toppings. Processed cheese or cheese spreads. Cheese curds. Beverages Alcohol. Sweetened drinks, such as soda, lemonade, fruit drinks, or punches. Fats and oils Butter. Stick margarine. Lard. Shortening. Ghee. Bacon fat. Tropical oils, such as coconut, palm kernel, or palm oils. Sweets and desserts Corn syrup. Sugars. Honey. Molasses. Candy. Jam and jelly. Syrup. Sweetened cereals. Cookies. Pies. Cakes. Donuts. Muffins. Ice cream. Condiments Store-bought sauces, dressings, and marinades that are high in sugar, such as ketchup and barbecue sauce. Summary  High levels of triglycerides can increase the risk of heart disease and stroke. Choosing the right foods can help lower your triglycerides.  Eat plenty of fresh fruits, vegetables, and whole grains. Choose low-fat dairy and lean meats. Eat fatty fish at least twice a week.  Avoid processed and prepackaged foods with added sugar, sodium, saturated fat, and trans fat.  If you need suggestions or have questions about what types of food are good for you, talk with your health care provider or a dietitian. This information is not intended to replace advice given to you by your health care  provider. Make sure you discuss any questions you have with your health care provider. Document Revised: 06/14/2017 Document Reviewed: 09/04/2016 Elsevier Patient Education  2020 [employer].

## 2020-03-17 ENCOUNTER — Other Ambulatory Visit: Payer: Self-pay | Admitting: Nurse Practitioner

## 2020-03-29 ENCOUNTER — Other Ambulatory Visit: Payer: Self-pay | Admitting: Nurse Practitioner

## 2020-06-03 ENCOUNTER — Other Ambulatory Visit: Payer: Self-pay | Admitting: Nurse Practitioner

## 2020-09-01 ENCOUNTER — Other Ambulatory Visit: Payer: Self-pay

## 2020-09-01 ENCOUNTER — Other Ambulatory Visit: Payer: PPO

## 2020-09-01 DIAGNOSIS — I10 Essential (primary) hypertension: Secondary | ICD-10-CM

## 2020-09-01 DIAGNOSIS — R972 Elevated prostate specific antigen [PSA]: Secondary | ICD-10-CM

## 2020-09-01 DIAGNOSIS — D696 Thrombocytopenia, unspecified: Secondary | ICD-10-CM

## 2020-09-01 DIAGNOSIS — E782 Mixed hyperlipidemia: Secondary | ICD-10-CM

## 2020-09-01 DIAGNOSIS — R7309 Other abnormal glucose: Secondary | ICD-10-CM | POA: Diagnosis not present

## 2020-09-05 ENCOUNTER — Other Ambulatory Visit: Payer: Self-pay

## 2020-09-05 ENCOUNTER — Ambulatory Visit (INDEPENDENT_AMBULATORY_CARE_PROVIDER_SITE_OTHER): Payer: PPO | Admitting: Nurse Practitioner

## 2020-09-05 ENCOUNTER — Encounter: Payer: Self-pay | Admitting: Nurse Practitioner

## 2020-09-05 VITALS — BP 122/64 | HR 71 | Temp 96.9°F | Ht 67.0 in | Wt 226.0 lb

## 2020-09-05 DIAGNOSIS — I10 Essential (primary) hypertension: Secondary | ICD-10-CM

## 2020-09-05 DIAGNOSIS — R600 Localized edema: Secondary | ICD-10-CM

## 2020-09-05 DIAGNOSIS — R739 Hyperglycemia, unspecified: Secondary | ICD-10-CM

## 2020-09-05 DIAGNOSIS — F17200 Nicotine dependence, unspecified, uncomplicated: Secondary | ICD-10-CM | POA: Diagnosis not present

## 2020-09-05 DIAGNOSIS — E782 Mixed hyperlipidemia: Secondary | ICD-10-CM | POA: Diagnosis not present

## 2020-09-05 DIAGNOSIS — Z902 Acquired absence of lung [part of]: Secondary | ICD-10-CM

## 2020-09-05 DIAGNOSIS — D696 Thrombocytopenia, unspecified: Secondary | ICD-10-CM | POA: Diagnosis not present

## 2020-09-05 MED ORDER — HYDRALAZINE HCL 25 MG PO TABS: ORAL_TABLET | ORAL | 1 refills | Status: DC

## 2020-09-05 MED ORDER — LOSARTAN POTASSIUM 50 MG PO TABS: ORAL_TABLET | ORAL | 1 refills | Status: DC

## 2020-09-05 MED ORDER — METOPROLOL TARTRATE 25 MG PO TABS: 12.5000 mg | ORAL_TABLET | Freq: Two times a day (BID) | ORAL | 1 refills | Status: DC

## 2020-09-05 NOTE — Progress Notes (Signed)
Careteam: Patient Care Team: [name], NP as PCP - General (Nurse Practitioner) Druscilla Brownie, MD as Consulting Physician (Dermatology)  PLACE OF SERVICE:  [place] Directive information Does Patient Have a Medical Advance Directive?: Yes, Type of Advance Directive: Out of facility DNR (pink MOST or yellow form), Pre-existing out of facility DNR order (yellow form or pink MOST form): Yellow form placed in chart (order not valid for inpatient use);Pink MOST form placed in chart (order not valid for inpatient use), Does patient want to make changes to medical advance directive?: No - Patient declined  Allergies  Allergen Reactions  . Asa [Aspirin] Anaphylaxis and Swelling    Angioedema/Eyes and lip swelling  . Nsaids Hives  . Tolmetin Hives  . Naproxen Hives    Chief Complaint  Patient presents with  . Medical Management of Chronic Issues    6 month follow-up and discuss labs      HPI: Patient is a 80 y.o. male for routine follow up.   Continues to smoke, less than a pack per day. Depends on activities, no worsening shortness of breath, cough or congestion.   htn- controlled on metoprolol, losartan, hydralazine.   GI screening- non-cancer growth noted in colon from colonoscopy. Pt feels like GI told him he did not need any additional screening. Follow up in chart reveals he needs a 5 year screening which is due in April.   Review of Systems:  Review of Systems  Constitutional: Negative for chills, fever and weight loss.  HENT: Negative for tinnitus.   Respiratory: Negative for cough, sputum production and shortness of breath.   Cardiovascular: Negative for chest pain, palpitations and leg swelling.  Gastrointestinal: Negative for abdominal pain, constipation, diarrhea and heartburn.  Genitourinary: Positive for frequency (unchanged). Negative for dysuria and urgency.  Musculoskeletal: Negative for back pain, falls, joint pain and myalgias.   Skin: Negative.   Neurological: Negative for dizziness and headaches.  Psychiatric/Behavioral: Negative for depression and memory loss. The patient does not have insomnia.     Past Medical History:  Diagnosis Date  . A-fib ([place])   . Acute appendicitis with peritoneal abscess 01/28/2013  . Anxiety   . Arthritis   . Bacteremia September 2015  . Elevated PSA   . Essential hypertension 04/16/2014  . Heart murmur    childhood  . Ileus, postoperative ([place]) 01/28/2013  . Lung cancer ([place])   . Obesity, morbid ([place]) 01/28/2013  . Shortness of breath   . Tobacco use disorder 01/28/2013   Past Surgical History:  Procedure Laterality Date  . APPENDECTOMY    . [place], 2003   Dr.Apleton   . CARPAL TUNNEL RELEASE    . COLONOSCOPY     x 3  . EYE SURGERY Left    muscle of the eye   . KNEE SURGERY Bilateral   . LAPAROSCOPIC APPENDECTOMY N/A 01/25/2013   Procedure: APPENDECTOMY LAPAROSCOPIC;  Surgeon: Ralene Ok, MD;  Location: [place];  Service: General;  Laterality: N/A;  . LYMPH NODE DISSECTION Left 04/26/2015   Procedure: LYMPH NODE DISSECTION;  Surgeon: Grace Isaac, MD;  Location: [place];  Service: Thoracic;  Laterality: Left;  . SHOULDER SURGERY Right 2014   Dr.Whitfield rotator cuff surgery  . SKIN SURGERY Right 01/2019   Right Leg, cancerous area removed  . TEE WITHOUT CARDIOVERSION N/A 04/07/2014   Procedure: TRANSESOPHAGEAL ECHOCARDIOGRAM (TEE);  Surgeon: Candee Furbish, MD;  Location: [facility];  Service: Cardiovascular;  Laterality:  N/A;  . VIDEO ASSISTED THORACOSCOPY (VATS)/WEDGE RESECTION Left 04/26/2015   Procedure: LEFT VIDEO ASSISTED THORACOSCOPY (VATS) WITH LEFT LOWER LOBECTOMY;  Surgeon: Grace Isaac, MD;  Location: [facility];  Service: Thoracic;  Laterality: Left;  Marland Kitchen VIDEO BRONCHOSCOPY N/A 04/26/2015   Procedure: VIDEO BRONCHOSCOPY;  Surgeon: Grace Isaac, MD;  Location: [place];  Service: Thoracic;  Laterality: N/A;   Social History:   reports  that he has been smoking cigarettes and cigars. He started smoking about 62 years ago. He has a 30.00 pack-year smoking history. He quit smokeless tobacco use about 17 years ago.  His smokeless tobacco use included chew. He reports that he does not drink alcohol and does not use drugs.  Family History  Problem Relation Age of Onset  . Irritable bowel syndrome Mother   . Cancer Neg Hx   . Lung disease Neg Hx   . Colon cancer Neg Hx   . Colon polyps Neg Hx     Medications: Patient's Medications  New Prescriptions   No medications on file  Previous Medications   HYDRALAZINE (APRESOLINE) 25 MG TABLET    TAKE 1 TABLET BY MOUTH THREE TIMES DAILY FOR BLOOD PRESSURE   LOSARTAN (COZAAR) 50 MG TABLET    TAKE 1 TABLET BY MOUTH EVERY DAY FOR BLOOD PRESSURE   METOPROLOL TARTRATE (LOPRESSOR) 25 MG TABLET    TAKE 1/2 TABLET BY MOUTH TWICE DAILY   MULTIPLE VITAMIN (MULTIVITAMIN) TABLET    Take 1 tablet by mouth daily.  Modified Medications   No medications on file  Discontinued Medications   No medications on file    Physical Exam:  Vitals:   09/05/20 0939  BP: 122/64  Pulse: 71  Temp: (!) 96.9 F (36.1 C)  TempSrc: Temporal  SpO2: 95%  Weight: 226 lb (102.5 kg)  Height: 5\' 7"  (1.702 m)   Body mass index is 35.4 kg/m. Wt Readings from Last 3 Encounters:  09/05/20 226 lb (102.5 kg)  02/29/20 221 lb (100.2 kg)  02/04/20 (!) 219 lb (99.3 kg)    Physical Exam Constitutional:      General: He is not in acute distress.    Appearance: He is well-developed and well-nourished. He is not diaphoretic.  HENT:     Head: Normocephalic and atraumatic.     Mouth/Throat:     Mouth: Oropharynx is clear and moist.     Pharynx: No oropharyngeal exudate.  Eyes:     Extraocular Movements: EOM normal.     Conjunctiva/sclera: Conjunctivae normal.     Pupils: Pupils are equal, round, and reactive to light.  Cardiovascular:     Rate and Rhythm: Normal rate and regular rhythm.     Heart sounds:  Normal heart sounds.  Pulmonary:     Effort: Pulmonary effort is normal.     Breath sounds: Normal breath sounds.  Abdominal:     General: Bowel sounds are normal.     Palpations: Abdomen is soft.  Musculoskeletal:        General: No tenderness or edema.     Cervical back: Normal range of motion and neck supple.  Skin:    General: Skin is warm and dry.  Neurological:     Mental Status: He is alert and oriented to person, place, and time.  Psychiatric:        Mood and Affect: Mood and affect normal.    Labs reviewed: Basic Metabolic Panel: Recent Labs    01/11/20 0852 02/26/20 0944 09/01/20 0810  NA 139 141 144  K 4.1 4.3 5.0  CL 102 104 107  CO2 29 27 30   GLUCOSE 105* 92 109*  BUN 23 21 21   CREATININE 0.86 0.94 0.94  CALCIUM 9.2 9.3 9.6   Liver Function Tests: Recent Labs    02/26/20 0944  AST 20  ALT 17  BILITOT 0.8  PROT 7.1   No results for input(s): LIPASE, AMYLASE in the last 8760 hours. No results for input(s): AMMONIA in the last 8760 hours. CBC: Recent Labs    02/26/20 0944 09/01/20 0810  WBC 8.6 7.7  NEUTROABS 6,011 5,336  HGB 17.5* 17.5*  HCT 52.0* 52.6*  MCV 97.9 99.1  PLT 232 212   Lipid Panel: Recent Labs    02/26/20 0944 09/01/20 0810  CHOL 155 153  HDL 42 39*  LDLCALC 86 86  TRIG 173* 180*  CHOLHDL 3.7 3.9   TSH: No results for input(s): TSH in the last 8760 hours. A1C: Lab Results  Component Value Date   HGBA1C 5.2 02/26/2019     Assessment/Plan 1. Essential hypertension - controlled on current regimen, recommended lifestyle modifications as well.  - hydrALAZINE (APRESOLINE) 25 MG tablet; TAKE 1 TABLET BY MOUTH THREE TIMES DAILY FOR BLOOD PRESSURE  Dispense: 270 tablet; Refill: 1 - losartan (COZAAR) 50 MG tablet; TAKE 1 TABLET BY MOUTH EVERY DAY FOR BLOOD PRESSURE  Dispense: 90 tablet; Refill: 1 - metoprolol tartrate (LOPRESSOR) 25 MG tablet; Take 0.5 tablets (12.5 mg total) by mouth 2 (two) times daily.  Dispense: 90  tablet; Refill: 1  2. Tobacco use disorder Strongly encouraged smoking cessation however he does not wish to quit despite education.   3. Mixed hyperlipidemia -recommended and education provided on dietary modifications.   4. Thrombocytopenia, unspecified ([place]) Stable at this time.   5. Lower extremity edema Well controlled at this time.   6. 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   7. Obesity, morbid ([place]) -BMI of 35 with hyperlipidemia and hypertension. Encouraged weight loss through dietary modifications and increase physical activity.   8. Hyperglycemia Will add on A1c, low sugar, low carbohydrate diet recommended.    Next appt: 6 months.  Carlos American. [place], [facility] [phone]

## 2020-09-05 NOTE — Patient Instructions (Signed)
Recommend routine dental care.   [facility] in [place], [employer] Address: [address] [place], [place] [address] Phone: [phone]  Dr Leontine Locket [place]  [phone]

## 2020-09-06 ENCOUNTER — Encounter: Payer: Self-pay | Admitting: Gastroenterology

## 2020-09-06 LAB — PSA: PSA: 2.69 ng/mL (ref <=4.0)

## 2020-09-06 LAB — LIPID PANEL
Cholesterol: 153 mg/dL (ref <200)
HDL: 39 mg/dL — ABNORMAL LOW (ref >=40)
LDL Cholesterol (Calc): 86 mg/dL (calc)
Non-HDL Cholesterol (Calc): 114 mg/dL (calc) (ref <130)
Total CHOL/HDL Ratio: 3.9 (calc) (ref <5.0)
Triglycerides: 180 mg/dL — ABNORMAL HIGH (ref <150)

## 2020-09-06 LAB — CBC WITH DIFFERENTIAL/PLATELET
Absolute Monocytes: 554 cells/uL (ref 200–950)
Basophils Absolute: 23 cells/uL (ref 0–200)
Basophils Relative: 0.3 %
Eosinophils Absolute: 77 cells/uL (ref 15–500)
Eosinophils Relative: 1 %
HCT: 52.6 % — ABNORMAL HIGH (ref 38.5–50.0)
Hemoglobin: 17.5 g/dL — ABNORMAL HIGH (ref 13.2–17.1)
Lymphs Abs: 1709 cells/uL (ref 850–3900)
MCH: 33 pg (ref 27.0–33.0)
MCHC: 33.3 g/dL (ref 32.0–36.0)
MCV: 99.1 fL (ref 80.0–100.0)
MPV: 11.3 fL (ref 7.5–12.5)
Monocytes Relative: 7.2 %
Neutro Abs: 5336 cells/uL (ref 1500–7800)
Neutrophils Relative %: 69.3 %
Platelets: 212 10*3/uL (ref 140–400)
RBC: 5.31 10*6/uL (ref 4.20–5.80)
RDW: 13.2 % (ref 11.0–15.0)
Total Lymphocyte: 22.2 %
WBC: 7.7 10*3/uL (ref 3.8–10.8)

## 2020-09-06 LAB — TEST AUTHORIZATION

## 2020-09-06 LAB — BASIC METABOLIC PANEL WITH GFR
BUN: 21 mg/dL (ref 7–25)
CO2: 30 mmol/L (ref 20–32)
Calcium: 9.6 mg/dL (ref 8.6–10.3)
Chloride: 107 mmol/L (ref 98–110)
Creat: 0.94 mg/dL (ref 0.70–1.18)
GFR, Est African American: 90 mL/min/{1.73_m2} (ref >=60)
GFR, Est Non African American: 78 mL/min/{1.73_m2} (ref >=60)
Glucose, Bld: 109 mg/dL — ABNORMAL HIGH (ref 65–99)
Potassium: 5 mmol/L (ref 3.5–5.3)
Sodium: 144 mmol/L (ref 135–146)

## 2020-09-06 LAB — HEMOGLOBIN A1C
Hgb A1c MFr Bld: 5.1 % of total Hgb (ref <5.7)
Mean Plasma Glucose: 100 mg/dL
eAG (mmol/L): 5.5 mmol/L

## 2020-09-14 DIAGNOSIS — H2513 Age-related nuclear cataract, bilateral: Secondary | ICD-10-CM | POA: Diagnosis not present

## 2020-09-14 DIAGNOSIS — H0102A Squamous blepharitis right eye, upper and lower eyelids: Secondary | ICD-10-CM | POA: Diagnosis not present

## 2020-09-14 DIAGNOSIS — H0102B Squamous blepharitis left eye, upper and lower eyelids: Secondary | ICD-10-CM | POA: Diagnosis not present

## 2020-09-20 DIAGNOSIS — H903 Sensorineural hearing loss, bilateral: Secondary | ICD-10-CM | POA: Diagnosis not present

## 2020-10-24 ENCOUNTER — Other Ambulatory Visit: Payer: Self-pay

## 2020-10-24 ENCOUNTER — Ambulatory Visit (AMBULATORY_SURGERY_CENTER): Payer: Self-pay | Admitting: *Deleted

## 2020-10-24 VITALS — Ht 67.0 in | Wt 228.0 lb

## 2020-10-24 DIAGNOSIS — Z86010 Personal history of colonic polyps: Secondary | ICD-10-CM

## 2020-10-24 MED ORDER — PEG 3350-KCL-NA BICARB-NACL 420 G PO SOLR: 4000.0000 mL | Freq: Once | ORAL | 0 refills | Status: AC

## 2020-10-24 NOTE — Progress Notes (Signed)
No egg or soy allergy known to patient  No issues with past sedation with any surgeries or procedures Patient denies ever being told they had issues or difficulty with intubation  No FH of Malignant Hyperthermia No diet pills per patient No home 02 use per patient  No blood thinners per patient  Pt denies issues with constipation  No A fib or A flutter  EMMI video to pt or via MyChart  COVID 19 guidelines implemented in [facility] today with Pt and RN  Pt is fully vaccinated  for Covid + booster  Recently with eating noticed Occ eats and foods wants to come back up- will go 1-2 months between episodes   Pt given the option in PV today for Golytely prep verses  alternative prep  ( Suprep/Plenvu)-  Pt is aware the Golytely has more volume but is more cost effective and the Suprep/Plenvu is less volume but may cost $45-150.  Pt voiced understanding of this and choose Golytely  Prep.    Due to the COVID-19 pandemic we are asking patients to follow certain guidelines.  Pt aware of COVID protocols and [facility] guidelines

## 2020-10-25 ENCOUNTER — Encounter: Payer: Self-pay | Admitting: Gastroenterology

## 2020-11-07 ENCOUNTER — Encounter: Payer: Self-pay | Admitting: Gastroenterology

## 2020-11-07 ENCOUNTER — Other Ambulatory Visit: Payer: Self-pay

## 2020-11-07 ENCOUNTER — Ambulatory Visit (AMBULATORY_SURGERY_CENTER): Payer: PPO | Admitting: Gastroenterology

## 2020-11-07 VITALS — BP 97/49 | HR 71 | Temp 98.7°F | Resp 15 | Ht 67.0 in | Wt 228.0 lb

## 2020-11-07 DIAGNOSIS — Z860100 Personal history of colon polyps, unspecified: Secondary | ICD-10-CM

## 2020-11-07 DIAGNOSIS — K635 Polyp of colon: Secondary | ICD-10-CM | POA: Diagnosis not present

## 2020-11-07 DIAGNOSIS — D123 Benign neoplasm of transverse colon: Secondary | ICD-10-CM

## 2020-11-07 DIAGNOSIS — Z1211 Encounter for screening for malignant neoplasm of colon: Secondary | ICD-10-CM | POA: Diagnosis not present

## 2020-11-07 DIAGNOSIS — D122 Benign neoplasm of ascending colon: Secondary | ICD-10-CM

## 2020-11-07 DIAGNOSIS — D120 Benign neoplasm of cecum: Secondary | ICD-10-CM

## 2020-11-07 MED ORDER — SODIUM CHLORIDE 0.9 % IV SOLN: 500.0000 mL | Freq: Once | INTRAVENOUS | Status: DC

## 2020-11-07 NOTE — Progress Notes (Signed)
pt tolerated well. VSS. awake and to recovery. Report given to RN.  

## 2020-11-07 NOTE — Op Note (Signed)
[facility] Patient Name: Tylan Kinn Procedure Date: 11/07/2020 10:45 AM MRN: [id number] Endoscopist: Milus Banister , MD Age: 80 Referring MD:  Date of Birth: 11/22/1940 Gender: Male Account #: [id number] Procedure:                Colonoscopy Indications:              High risk colon cancer surveillance: Personal                            history of colonic polyps; Colonoscop 2017 single                            subCM adenoma removed Medicines:                Monitored Anesthesia Care Procedure:                Pre-Anesthesia Assessment:                           - Prior to the procedure, a History and Physical                            was performed, and patient medications and                            allergies were reviewed. The patient's tolerance of                            previous anesthesia was also reviewed. The risks                            and benefits of the procedure and the sedation                            options and risks were discussed with the patient.                            All questions were answered, and informed consent                            was obtained. Prior Anticoagulants: The patient has                            taken no previous anticoagulant or antiplatelet                            agents. ASA Grade Assessment: III - A patient with                            severe systemic disease. After reviewing the risks                            and benefits, the patient was deemed in  satisfactory condition to undergo the procedure.                           After obtaining informed consent, the colonoscope                            was passed under direct vision. Throughout the                            procedure, the patient's blood pressure, pulse, and                            oxygen saturations were monitored continuously. The                            Olympus CF-HQ190L [id number])  Colonoscope was                            introduced through the anus and advanced to the the                            cecum, identified by appendiceal orifice and                            ileocecal valve. The colonoscopy was performed                            without difficulty. The patient tolerated the                            procedure well. The quality of the bowel                            preparation was good. The ileocecal valve,                            appendiceal orifice, and rectum were photographed. Scope In: 10:50:06 AM Scope Out: 11:12:22 AM Scope Withdrawal Time: 0 hours 20 minutes 18 seconds  Total Procedure Duration: 0 hours 22 minutes 16 seconds  Findings:                 Two sessile polyps were found in the transverse                            colon and cecum. The polyps were 4 to 7 mm in size.                            These polyps were removed with a cold snare.                            Resection and retrieval were complete. jar 1                           A 25 mm polyp was found  in the proximal ascending                            colon. The polyp was sessile. The polyp was removed                            with a saline injection-lift technique using a cold                            snare,piecemeal resection. Resection and retrieval                            were apparently complete. jar 2                           The exam was otherwise without abnormality on                            direct and retroflexion views. Complications:            No immediate complications. Estimated blood loss:                            None. Estimated Blood Loss:     Estimated blood loss: none. Impression:               - Two 4 to 7 mm polyps in the transverse colon and                            in the cecum, removed with a cold snare. Resected                            and retrieved. jar 1                           - One 25 mm polyp in the proximal ascending  colon,                            removed piecemeal using injection-lift and a cold                            snare. Resected and retrieved. jar 2                           - The examination was otherwise normal on direct                            and retroflexion views. Recommendation:           - Patient has a contact number available for                            emergencies. The signs and symptoms of potential  delayed complications were discussed with the                            patient. Return to normal activities tomorrow.                            Written discharge instructions were provided to the                            patient.                           - Resume previous diet.                           - Continue present medications.                           - Await pathology results. Milus Banister, MD 11/07/2020 11:16:15 AM This report has been signed electronically.

## 2020-11-07 NOTE — Patient Instructions (Signed)
Handout given:  Polyps Resume previous diet Continue current medications Await pathology results  YOU HAD AN ENDOSCOPIC PROCEDURE TODAY AT [facility]:   Refer to the procedure report that was given to you for any specific questions about what was found during the examination.  If the procedure report does not answer your questions, please call your gastroenterologist to clarify.  If you requested that your care partner not be given the details of your procedure findings, then the procedure report has been included in a sealed envelope for you to review at your convenience later.  YOU SHOULD EXPECT: Some feelings of bloating in the abdomen. Passage of more gas than usual.  Walking can help get rid of the air that was put into your GI tract during the procedure and reduce the bloating. If you had a lower endoscopy (such as a colonoscopy or flexible sigmoidoscopy) you may notice spotting of blood in your stool or on the toilet paper. If you underwent a bowel prep for your procedure, you may not have a normal bowel movement for a few days.  Please Note:  You might notice some irritation and congestion in your nose or some drainage.  This is from the oxygen used during your procedure.  There is no need for concern and it should clear up in a day or so.  SYMPTOMS TO REPORT IMMEDIATELY:   Following lower endoscopy (colonoscopy or flexible sigmoidoscopy):  Excessive amounts of blood in the stool  Significant tenderness or worsening of abdominal pains  Swelling of the abdomen that is new, acute  Fever of 100F or higher  For urgent or emergent issues, a gastroenterologist can be reached at any hour by calling [phone]. Do not use MyChart messaging for urgent concerns.    DIET:  We do recommend a small meal at first, but then you may proceed to your regular diet.  Drink plenty of fluids but you should avoid alcoholic beverages for 24 hours.  ACTIVITY:  You should plan to take  it easy for the rest of today and you should NOT DRIVE or use heavy machinery until tomorrow (because of the sedation medicines used during the test).    FOLLOW UP: Our staff will call the number listed on your records 48-72 hours following your procedure to check on you and address any questions or concerns that you may have regarding the information given to you following your procedure. If we do not reach you, we will leave a message.  We will attempt to reach you two times.  During this call, we will ask if you have developed any symptoms of COVID 19. If you develop any symptoms (ie: fever, flu-like symptoms, shortness of breath, cough etc.) before then, please call [phone].  If you test positive for Covid 19 in the 2 weeks post procedure, please call and report this information to [place].    If any biopsies were taken you will be contacted by phone or by letter within the next 1-3 weeks.  Please call us at [phone] if you have not heard about the biopsies in 3 weeks.   SIGNATURES/CONFIDENTIALITY: You and/or your care partner have signed paperwork which will be entered into your electronic medical record.  These signatures attest to the fact that that the information above on your After Visit Summary has been reviewed and is understood.  Full responsibility of the confidentiality of this discharge information lies with you and/or your care-partner.

## 2020-11-07 NOTE — Progress Notes (Signed)
Pt's states no medical or surgical changes since previsit or office visit. 

## 2020-11-09 ENCOUNTER — Telehealth: Payer: Self-pay

## 2020-11-09 NOTE — Telephone Encounter (Signed)
  Follow up Call-  Call back number 11/07/2020  Post procedure Call Back phone  # [phone]  Permission to leave phone message Yes  Some recent data might be hidden     Patient questions:  Do you have a fever, pain , or abdominal swelling? No. Pain Score  0 *  Have you tolerated food without any problems? Yes.    Have you been able to return to your normal activities? Yes.    Do you have any questions about your discharge instructions: Diet   No. Medications  No. Follow up visit  No.  Do you have questions or concerns about your Care? No.  Actions: * If pain score is 4 or above: No action needed, pain <4.  1. Have you developed a fever since your procedure? no  2.   Have you had an respiratory symptoms (SOB or cough) since your procedure? No   3.   Have you tested positive for COVID 19 since your procedure no  4.   Have you had any family members/close contacts diagnosed with the COVID 19 since your procedure?  no   If yes to any of these questions please route to Joylene John, RN and Joella Prince, RN

## 2020-11-15 ENCOUNTER — Encounter: Payer: Self-pay | Admitting: Gastroenterology

## 2020-12-02 ENCOUNTER — Other Ambulatory Visit: Payer: Self-pay | Admitting: Nurse Practitioner

## 2020-12-02 DIAGNOSIS — I10 Essential (primary) hypertension: Secondary | ICD-10-CM

## 2020-12-26 ENCOUNTER — Other Ambulatory Visit: Payer: Self-pay | Admitting: *Deleted

## 2020-12-26 DIAGNOSIS — Z85118 Personal history of other malignant neoplasm of bronchus and lung: Secondary | ICD-10-CM

## 2021-01-03 ENCOUNTER — Ambulatory Visit (INDEPENDENT_AMBULATORY_CARE_PROVIDER_SITE_OTHER): Payer: PPO | Admitting: Nurse Practitioner

## 2021-01-03 ENCOUNTER — Telehealth: Payer: Self-pay

## 2021-01-03 ENCOUNTER — Encounter: Payer: Self-pay | Admitting: Nurse Practitioner

## 2021-01-03 ENCOUNTER — Other Ambulatory Visit: Payer: Self-pay

## 2021-01-03 DIAGNOSIS — Z0000 Encounter for general adult medical examination without abnormal findings: Secondary | ICD-10-CM | POA: Diagnosis not present

## 2021-01-03 NOTE — Telephone Encounter (Signed)
Mr. [name], lever are scheduled for a virtual visit with your provider today.    Just as we do with appointments in the office, we must obtain your consent to participate.  Your consent will be active for this visit and any virtual visit you may have with one of our providers in the next 365 days.    If you have a MyChart account, I can also send a copy of this consent to you electronically.  All virtual visits are billed to your insurance company just like a traditional visit in the office.  As this is a virtual visit, video technology does not allow for your provider to perform a traditional examination.  This may limit your provider's ability to fully assess your condition.  If your provider identifies any concerns that need to be evaluated in person or the need to arrange testing such as labs, EKG, etc, we will make arrangements to do so.    Although advances in technology are sophisticated, we cannot ensure that it will always work on either your end or our end.  If the connection with a video visit is poor, we may have to switch to a telephone visit.  With either a video or telephone visit, we are not always able to ensure that we have a secure connection.   I need to obtain your verbal consent now.   Are you willing to proceed with your visit today?   [name] has provided verbal consent on 01/03/2021 for a virtual visit (video or telephone).   [name], [facility] 01/03/2021  8:34 AM

## 2021-01-03 NOTE — Progress Notes (Signed)
Subjective:   [name] is a 80 y.o. male who presents for Medicare Annual/Subsequent preventive examination.  Review of Systems           Objective:    There were no vitals filed for this visit. There is no height or weight on file to calculate BMI.  Advanced Directives 01/03/2021 09/05/2020 02/29/2020 01/11/2020 12/29/2019 12/23/2018 08/28/2018  Does Patient Have a Medical Advance Directive? Yes Yes Yes No No No No  Type of Advance Directive Out of facility DNR (pink MOST or yellow form) Out of facility DNR (pink MOST or yellow form) Out of facility DNR (pink MOST or yellow form) - - - -  Does patient want to make changes to medical advance directive? No - Patient declined No - Patient declined No - Patient declined - - - -  Would patient like information on creating a medical advance directive? - - - No - Patient declined - No - Patient declined No - Patient declined  Pre-existing out of facility DNR order (yellow form or pink MOST form) Yellow form placed in chart (order not valid for inpatient use);Pink MOST form placed in chart (order not valid for inpatient use) Yellow form placed in chart (order not valid for inpatient use);Pink MOST form placed in chart (order not valid for inpatient use) Yellow form placed in chart (order not valid for inpatient use);Pink MOST form placed in chart (order not valid for inpatient use) - - - -    Current Medications (verified) Outpatient Encounter Medications as of 01/03/2021  Medication Sig   hydrALAZINE (APRESOLINE) 25 MG tablet TAKE 1 TABLET BY MOUTH THREE TIMES DAILY FOR BLOOD PRESSURE   losartan (COZAAR) 50 MG tablet TAKE 1 TABLET BY MOUTH EVERY DAY FOR BLOOD PRESSURE   metoprolol tartrate (LOPRESSOR) 25 MG tablet TAKE 1/2 TABLET BY MOUTH TWICE DAILY   [DISCONTINUED] Multiple Vitamin (MULTIVITAMIN) tablet Take 1 tablet by mouth daily. (Patient not taking: Reported on 11/07/2020)   No facility-administered encounter medications on file as of  01/03/2021.    Allergies (verified) Asa [aspirin], Nsaids, Tolmetin, and Naproxen   History: Past Medical History:  Diagnosis Date   A-fib ([place])    Acute appendicitis with peritoneal abscess 01/28/2013   Allergy    seasonal    Anxiety    Arthritis    Bacteremia September 2015   Elevated PSA    Essential hypertension 04/16/2014   Heart murmur    childhood   Ileus, postoperative ([place]) 01/28/2013   Lung cancer ([place]) 2016   Obesity, morbid ([place]) 01/28/2013   Shortness of breath    Tobacco use disorder 01/28/2013   Past Surgical History:  Procedure Laterality Date   APPENDECTOMY     BACK SURGERY  1997, 2003   Dr.Apleton    CARPAL TUNNEL RELEASE     COLONOSCOPY     x 3   EYE SURGERY Left    muscle of the eye    KNEE SURGERY Bilateral    LAPAROSCOPIC APPENDECTOMY N/A 01/25/2013   Procedure: APPENDECTOMY LAPAROSCOPIC;  Surgeon: Ralene Ok, MD;  Location: [place];  Service: General;  Laterality: N/A;   LYMPH NODE DISSECTION Left 04/26/2015   Procedure: LYMPH NODE DISSECTION;  Surgeon: Grace Isaac, MD;  Location: [place];  Service: Thoracic;  Laterality: Left;   POLYPECTOMY     SHOULDER SURGERY Right 2014   Dr.Whitfield rotator cuff surgery   SKIN SURGERY Right 01/2019   Right Leg, cancerous area removed   TEE  WITHOUT CARDIOVERSION N/A 04/07/2014   Procedure: TRANSESOPHAGEAL ECHOCARDIOGRAM (TEE);  Surgeon: Candee Furbish, MD;  Location[facility] ENDOSCOPY;  Service: Cardiovascular;  Laterality: N/A;   VIDEO ASSISTED THORACOSCOPY (VATS)/WEDGE RESECTION Left 04/26/2015   Procedure: LEFT VIDEO ASSISTED THORACOSCOPY (VATS) WITH LEFT LOWER LOBECTOMY;  Surgeon: Grace Isaac, MD;  Location: [place];  Service: Thoracic;  Laterality: Left;   VIDEO BRONCHOSCOPY N/A 04/26/2015   Procedure: VIDEO BRONCHOSCOPY;  Surgeon: Grace Isaac, MD;  Location: [facility] OR;  Service: Thoracic;  Laterality: N/A;   Family History  Problem Relation Age of Onset   Irritable bowel syndrome Mother    Cancer  Neg Hx    Lung disease Neg Hx    Colon cancer Neg Hx    Colon polyps Neg Hx    Esophageal cancer Neg Hx    Rectal cancer Neg Hx    Stomach cancer Neg Hx    Social History   Socioeconomic History   Marital status: Divorced    Spouse name: Not on file   Number of children: 0   Years of education: Not on file   Highest education level: Not on file  Occupational History   Occupation: Retired  Tobacco Use   Smoking status: Every Day    Packs/day: 0.50    Years: 60.00    Pack years: 30.00    Types: Cigarettes, Cigars    Start date: 07/16/1958   Smokeless tobacco: Former    Types: Chew    Quit date: 07/17/2003   Tobacco comments:    Quit smoking cigarettes about age 59   Vaping Use   Vaping Use: Former   Quit date: 07/16/2008  Substance and Sexual Activity   Alcohol use: No    Alcohol/week: 0.0 standard drinks   Drug use: No   Sexual activity: Not Currently  Other Topics Concern   Not on file  Social History Narrative   No diet   Yes, eats/drinks things with caffeine    Divorced, married 1979   Lives in a house, one stories , one person, no pets   Current/past profession- [employer]   Patient exercises, golf 3 days weekly        Originally from [place]. Previously lived in [place] [facility] [place] years and moved back to [place] in 1968. No international travel. He has traveled through multiple states traveling to [place]. 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 Unsure if he was exposed to any asbestos. No mold exposure. No pets currently. Previously has owned dogs. Parakeet as a child. No hot tub exposure. Enjoys golfing.   Social Determinants of Health   Financial Resource Strain: Not on file  Food Insecurity: Not on file  Transportation Needs: Not on file  Physical Activity: Not on file  Stress: Not on file  Social Connections: Not on file    Tobacco Counseling Ready to quit: Not Answered Counseling given: Not  Answered Tobacco comments: Quit smoking cigarettes about age 37    Clinical Intake:                 Diabetic?         Activities of Daily Living No flowsheet data found.  Patient Care Team: [name], NP as PCP - General (Nurse Practitioner) Druscilla Brownie, MD as Consulting Physician (Dermatology) Milus Banister, MD as Attending Physician (Gastroenterology)  Indicate any recent Medical Services you may have received from other than [facility] providers in  the past year (date may be approximate).     Assessment:   This is a routine wellness examination for [facility].  Hearing/Vision screen Hearing Screening - Comments:: Patient wears hearing aids. Vision Screening - Comments:: Patient has no vision problems. Patient had last eye exam Feb. 2022. Patient sees Dr. Katy Fitch.  Dietary issues and exercise activities discussed:     Goals Addressed   None   Depression Screen PHQ 2/9 Scores 01/03/2021 09/05/2020 01/11/2020 12/29/2019 12/23/2018 12/18/2017 11/23/2016  PHQ - 2 Score 0 0 0 0 0 0 0    Fall Risk Fall Risk  01/03/2021 09/05/2020 01/11/2020 12/29/2019 08/31/2019  Falls in the past year? 0 0 0 0 0  Number falls in past yr: 0 0 0 0 0  Comment - - - - -  Injury with Fall? 0 0 - 0 0  Comment - - - - -    FALL RISK PREVENTION PERTAINING TO THE HOME:  Any stairs in or around the home? Yes  If so, are there any without handrails? Yes  Home free of loose throw rugs in walkways, pet beds, electrical cords, etc? Yes  Adequate lighting in your home to reduce risk of falls? Yes   ASSISTIVE DEVICES UTILIZED TO PREVENT FALLS:  Life alert? Yes  Use of a cane, walker or w/c? No  Grab bars in the bathroom? Yes  Shower chair or bench in shower? Yes  Elevated toilet seat or a handicapped toilet? Yes   TIMED UP AND GO:  Was the test performed? No .    Cognitive Function: MMSE - Mini Mental State Exam 12/23/2018 11/23/2016 02/14/2016 07/29/2014  Orientation to time 5 5 5 5    Orientation to Place 5 5 5 5   Registration 3 3 3 3   Attention/ Calculation 5 4 5 5   Recall 3 2 3 3   Language- name 2 objects 2 2 2 2   Language- repeat 1 1 1 1   Language- follow 3 step command 3 3 3 3   Language- read & follow direction 1 1 1 1   Write a sentence 1 1 0 1  Copy design 1 1 1 1   Total score 30 28 29 30      6CIT Screen 01/03/2021 12/29/2019  What Year? 0 points 0 points  What month? 0 points 0 points  What time? 0 points 0 points  Count back from 20 0 points 0 points  Months in reverse 2 points 2 points  Repeat phrase 0 points 6 points  Total Score 2 8    Immunizations Immunization History  Administered Date(s) Administered   Influenza, High Dose Seasonal PF 03/25/2017, 03/25/2018   Influenza,inj,quad, With Preservative 03/31/2016   Influenza-Unspecified 04/29/2014, 03/02/2019, 03/08/2020   PFIZER(Purple Top)SARS-COV-2 Vaccination 08/07/2019, 08/28/2019, 04/21/2020   Pneumococcal Conjugate-13 02/14/2016   Pneumococcal-Unspecified 04/29/2014   Zoster Recombinat (Shingrix) 12/18/2017, 03/01/2018    TDAP status: Due, Education has been provided regarding the importance of this vaccine. Advised may receive this vaccine at local pharmacy or Health Dept. Aware to provide a copy of the vaccination record if obtained from local pharmacy or Health Dept. Verbalized acceptance and understanding.  Flu Vaccine status: Up to date  Pneumococcal vaccine status: Up to date  Covid-19 vaccine status: Completed vaccines  Qualifies for Shingles Vaccine? Yes   Zostavax completed No   Shingrix Completed?: Yes  Screening Tests Health Maintenance  Topic Date Due   COVID-19 Vaccine (4 - Booster for Pfizer series) 07/22/2020   INFLUENZA VACCINE  02/13/2021  TETANUS/TDAP  07/17/2023   COLONOSCOPY (Pts 45-21yrs Insurance coverage will need to be confirmed)  11/07/2025   Hepatitis C Screening  Completed   PNA vac Low Risk Adult  Completed   Zoster Vaccines- Shingrix  Completed    HPV VACCINES  Aged Out    Health Maintenance  Health Maintenance Due  Topic Date Due   COVID-19 Vaccine (4 - Booster for Pfizer series) 07/22/2020    Colorectal cancer screening: Type of screening: Colonoscopy. Completed 11/07/2020. Repeat every 5 years  Lung Cancer Screening: (Low Dose CT Chest recommended if Age 15-80 years, 30 pack-year currently smoking OR have quit w/in 15years.) does qualify.   Lung Cancer Screening Referral  Additional Screening:  Hepatitis C Screening: does qualify; Completed   Vision Screening: Recommended annual ophthalmology exams for early detection of glaucoma and other disorders of the eye. Is the patient up to date with their annual eye exam?  Yes  Who is the provider or what is the name of the office in which the patient attends annual eye exams? [facility] If pt is not established with a provider, would they like to be referred to a provider to establish care? No .   Dental Screening: Recommended annual dental exams for proper oral hygiene  Community Resource Referral / Chronic Care Management: CRR required this visit?  No   CCM required this visit?  No      Plan:     I have personally reviewed and noted the following in the patient's chart:   Medical and social history Use of alcohol, tobacco or illicit drugs  Current medications and supplements including opioid prescriptions. Patient is not currently taking opioid prescriptions. Functional ability and status Nutritional status Physical activity Advanced directives List of other physicians Hospitalizations, surgeries, and ER visits in previous 12 months Vitals Screenings to include cognitive, depression, and falls Referrals and appointments  In addition, I have reviewed and discussed with patient certain preventive protocols, quality metrics, and best practice recommendations. A written personalized care plan for preventive services as well as general preventive health recommendations  were provided to patient.     [name], NP   01/03/2021   Nurse Notes:  Virtual Visit via Telephone Note  I connected withNAME@ on 01/03/21 at  8:30 AM EDT by telephone and verified that I am speaking with the correct person using two identifiers.  Location: Patient: home Provider: [facility].    I discussed the limitations, risks, security and privacy concerns of performing an evaluation and management service by telephone and the availability of in person appointments. I also discussed with the patient that there may be a patient responsible charge related to this service. The patient expressed understanding and agreed to proceed.   I discussed the assessment and treatment plan with the patient. The patient was provided an opportunity to ask questions and all were answered. The patient agreed with the plan and demonstrated an understanding of the instructions.   The patient was advised to call back or seek an in-person evaluation if the symptoms worsen or if the condition fails to improve as anticipated.  I provided 15 minutes of non-face-to-face time during this encounter.  [employer]. Harle Battiest Avs printed and mailed

## 2021-01-03 NOTE — Progress Notes (Signed)
This service is provided via telemedicine  No vital signs collected/recorded due to the encounter was a telemedicine visit.   Location of patient (ex: home, work):  Home  Patient consents to a telephone visit:  Yes, see encounter dated 01/03/2021  Location of the provider (ex: office, home):  [place]  Name of any referring provider:  N/A  Names of all persons participating in the telemedicine service and their role in the encounter:  Sherrie Mustache, Nurse Practitioner, Carroll Kinds, CMA, and patient.   Time spent on call:  10 minutes with medical assistant

## 2021-01-03 NOTE — Patient Instructions (Signed)
[name] , Thank you for taking time to come for your Medicare Wellness Visit. I appreciate your ongoing commitment to your health goals. Please review the following plan we discussed and let me know if I can assist you in the future.   Screening recommendations/referrals: Colonoscopy up to date Recommended yearly ophthalmology/optometry visit for glaucoma screening and checkup Recommended yearly dental visit for hygiene and checkup  Vaccinations: Influenza vaccine up to date Pneumococcal vaccine up to date Tdap vaccine RECOMMENDED- to get through your local pharmacy Shingles vaccine -up to date  Advanced directives: on file.   Conditions/risks identified: complications related to smoking, obesity, advance age.   Next appointment: 1 year for AWV  Preventive Care 78 Years and Older, Male Preventive care refers to lifestyle choices and visits with your health care provider that can promote health and wellness. What does preventive care include? A yearly physical exam. This is also called an annual well check. Dental exams once or twice a year. Routine eye exams. Ask your health care provider how often you should have your eyes checked. Personal lifestyle choices, including: Daily care of your teeth and gums. Regular physical activity. Eating a healthy diet. Avoiding tobacco and drug use. Limiting alcohol use. Practicing safe sex. Taking low doses of aspirin every day. Taking vitamin and mineral supplements as recommended by your health care provider. What happens during an annual well check? The services and screenings done by your health care provider during your annual well check will depend on your age, overall health, lifestyle risk factors, and family history of disease. Counseling  Your health care provider may ask you questions about your: Alcohol use. Tobacco use. Drug use. Emotional well-being. Home and relationship well-being. Sexual activity. Eating  habits. History of falls. Memory and ability to understand (cognition). Work and work Statistician. Screening  You may have the following tests or measurements: Height, weight, and BMI. Blood pressure. Lipid and cholesterol levels. These may be checked every 5 years, or more frequently if you are over 83 years old. Skin check. Lung cancer screening. You may have this screening every year starting at age 89 if you have a 30-pack-year history of smoking and currently smoke or have quit within the past 15 years. Fecal occult blood test (FOBT) of the stool. You may have this test every year starting at age 58. Flexible sigmoidoscopy or colonoscopy. You may have a sigmoidoscopy every 5 years or a colonoscopy every 10 years starting at age 36. Prostate cancer screening. Recommendations will vary depending on your family history and other risks. Hepatitis C blood test. Hepatitis B blood test. Sexually transmitted disease (STD) testing. Diabetes screening. This is done by checking your blood sugar (glucose) after you have not eaten for a while (fasting). You may have this done every 1-3 years. Abdominal aortic aneurysm (AAA) screening. You may need this if you are a current or former smoker. Osteoporosis. You may be screened starting at age 56 if you are at high risk. Talk with your health care provider about your test results, treatment options, and if necessary, the need for more tests. Vaccines  Your health care provider may recommend certain vaccines, such as: Influenza vaccine. This is recommended every year. Tetanus, diphtheria, and acellular pertussis (Tdap, Td) vaccine. You may need a Td booster every 10 years. Zoster vaccine. You may need this after age 51. Pneumococcal 13-valent conjugate (PCV13) vaccine. One dose is recommended after age 4. Pneumococcal polysaccharide (PPSV23) vaccine. One dose is recommended after age 67.  Talk to your health care provider about which screenings and  vaccines you need and how often you need them. This information is not intended to replace advice given to you by your health care provider. Make sure you discuss any questions you have with your health care provider. Document Released: 07/29/2015 Document Revised: 03/21/2016 Document Reviewed: 05/03/2015 Elsevier Interactive Patient Education  2017 [place] Prevention in the Home Falls can cause injuries. They can happen to people of all ages. There are many things you can do to make your home safe and to help prevent falls. What can I do on the outside of my home? Regularly fix the edges of walkways and driveways and fix any cracks. Remove anything that might make you trip as you walk through a door, such as a raised step or threshold. Trim any bushes or trees on the path to your home. Use bright outdoor lighting. Clear any walking paths of anything that might make someone trip, such as rocks or tools. Regularly check to see if handrails are loose or broken. Make sure that both sides of any steps have handrails. Any raised decks and porches should have guardrails on the edges. Have any leaves, snow, or ice cleared regularly. Use sand or salt on walking paths during winter. Clean up any spills in your garage right away. This includes oil or grease spills. What can I do in the bathroom? Use night lights. Install grab bars by the toilet and in the tub and shower. Do not use towel bars as grab bars. Use non-skid mats or decals in the tub or shower. If you need to sit down in the shower, use a plastic, non-slip stool. Keep the floor dry. Clean up any water that spills on the floor as soon as it happens. Remove soap buildup in the tub or shower regularly. Attach bath mats securely with double-sided non-slip rug tape. Do not have throw rugs and other things on the floor that can make you trip. What can I do in the bedroom? Use night lights. Make sure that you have a light by your  bed that is easy to reach. Do not use any sheets or blankets that are too big for your bed. They should not hang down onto the floor. Have a firm chair that has side arms. You can use this for support while you get dressed. Do not have throw rugs and other things on the floor that can make you trip. What can I do in the kitchen? Clean up any spills right away. Avoid walking on wet floors. Keep items that you use a lot in easy-to-reach places. If you need to reach something above you, use a strong step stool that has a grab bar. Keep electrical cords out of the way. Do not use floor polish or wax that makes floors slippery. If you must use wax, use non-skid floor wax. Do not have throw rugs and other things on the floor that can make you trip. What can I do with my stairs? Do not leave any items on the stairs. Make sure that there are handrails on both sides of the stairs and use them. Fix handrails that are broken or loose. Make sure that handrails are as long as the stairways. Check any carpeting to make sure that it is firmly attached to the stairs. Fix any carpet that is loose or worn. Avoid having throw rugs at the top or bottom of the stairs. If you do have throw  rugs, attach them to the floor with carpet tape. Make sure that you have a light switch at the top of the stairs and the bottom of the stairs. If you do not have them, ask someone to add them for you. What else can I do to help prevent falls? Wear shoes that: Do not have high heels. Have rubber bottoms. Are comfortable and fit you well. Are closed at the toe. Do not wear sandals. If you use a stepladder: Make sure that it is fully opened. Do not climb a closed stepladder. Make sure that both sides of the stepladder are locked into place. Ask someone to hold it for you, if possible. Clearly mark and make sure that you can see: Any grab bars or handrails. First and last steps. Where the edge of each step is. Use tools that  help you move around (mobility aids) if they are needed. These include: Canes. Walkers. Scooters. Crutches. Turn on the lights when you go into a dark area. Replace any light bulbs as soon as they burn out. Set up your furniture so you have a clear path. Avoid moving your furniture around. If any of your floors are uneven, fix them. If there are any pets around you, be aware of where they are. Review your medicines with your doctor. Some medicines can make you feel dizzy. This can increase your chance of falling. Ask your doctor what other things that you can do to help prevent falls. This information is not intended to replace advice given to you by your health care provider. Make sure you discuss any questions you have with your health care provider. Document Released: 04/28/2009 Document Revised: 12/08/2015 Document Reviewed: 08/06/2014 Elsevier Interactive Patient Education  2017 [employer].

## 2021-02-08 ENCOUNTER — Ambulatory Visit
Admission: RE | Admit: 2021-02-08 | Discharge: 2021-02-08 | Disposition: A | Discharge status: Home / Self Care | Payer: PPO | Source: Ambulatory Visit | Attending: Thoracic Surgery (Cardiothoracic Vascular Surgery) | Admitting: Thoracic Surgery (Cardiothoracic Vascular Surgery)

## 2021-02-08 DIAGNOSIS — Z85118 Personal history of other malignant neoplasm of bronchus and lung: Secondary | ICD-10-CM

## 2021-02-08 DIAGNOSIS — J439 Emphysema, unspecified: Secondary | ICD-10-CM | POA: Diagnosis not present

## 2021-02-08 DIAGNOSIS — I700 Atherosclerosis of aorta: Secondary | ICD-10-CM | POA: Diagnosis not present

## 2021-02-08 DIAGNOSIS — C3490 Malignant neoplasm of unspecified part of unspecified bronchus or lung: Secondary | ICD-10-CM | POA: Diagnosis not present

## 2021-02-14 ENCOUNTER — Ambulatory Visit: Payer: PPO | Admitting: Thoracic Surgery (Cardiothoracic Vascular Surgery)

## 2021-02-14 ENCOUNTER — Other Ambulatory Visit: Payer: Self-pay | Admitting: Thoracic Surgery (Cardiothoracic Vascular Surgery)

## 2021-02-14 ENCOUNTER — Other Ambulatory Visit: Payer: Self-pay

## 2021-02-14 ENCOUNTER — Encounter: Payer: Self-pay | Admitting: Thoracic Surgery (Cardiothoracic Vascular Surgery)

## 2021-02-14 VITALS — BP 120/63 | HR 76 | Resp 20 | Ht 67.0 in | Wt 223.0 lb

## 2021-02-14 DIAGNOSIS — Z85118 Personal history of other malignant neoplasm of bronchus and lung: Secondary | ICD-10-CM | POA: Diagnosis not present

## 2021-02-14 NOTE — Progress Notes (Signed)
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  Dr. Servando Snare did a left lower lobectomy for a sarcomatoid carcinoma of the lung in 2016.  He did not require adjuvant therapy.  He has been followed since that time.  Recently most of his problems been related to chronic back issues.  He has had multiple back surgeries.  He has some chronic respiratory problems with coughing and wheezing and dyspnea with exertion.  None of that has changed recently.  He is not having any fevers chills or sweats.  Past Medical History:  Diagnosis Date   A-fib [facility])    Acute appendicitis with peritoneal abscess 01/28/2013   Allergy    seasonal    Anxiety    Arthritis    Bacteremia September 2015   Elevated PSA    Essential hypertension 04/16/2014   Heart murmur    childhood   Ileus, postoperative ([place]) 01/28/2013   Lung cancer ([place]) 2016   Obesity, morbid ([place]) 01/28/2013   Shortness of breath    Tobacco use disorder 01/28/2013    Current Outpatient Medications  Medication Sig Dispense Refill   hydrALAZINE (APRESOLINE) 25 MG tablet TAKE 1 TABLET BY MOUTH THREE TIMES DAILY FOR BLOOD PRESSURE 270 tablet 1   losartan (COZAAR) 50 MG tablet TAKE 1 TABLET BY MOUTH EVERY DAY FOR BLOOD PRESSURE 90 tablet 1   metoprolol tartrate (LOPRESSOR) 25 MG tablet TAKE 1/2 TABLET BY MOUTH TWICE DAILY 90 tablet 1   No current facility-administered medications for this visit.    Physical Exam BP 120/63   Pulse 76   Resp 20   Ht 5\' 7"  (1.702 m)   Wt 223 lb (101.2 kg)   SpO2 90% Comment: RA  BMI 34.93 kg/m  Obese 80 year old [name] in no acute  distress Alert and oriented x3 with no focal deficits No carotid bruits Cardiac regular rate and rhythm with 2/6 systolic murmur Lungs with faint wheezes bilaterally, diminished at left base No cervical or supraclavicular adenopathy  Diagnostic Tests: CT CHEST WITHOUT CONTRAST   TECHNIQUE: Multidetector CT imaging of the chest was performed following the standard protocol without IV contrast.   COMPARISON:  02/04/2020.   FINDINGS: Cardiovascular: Atherosclerotic calcification of the aorta, aortic valve and coronary arteries. Heart size normal. Pulmonic trunk is enlarged. No pericardial effusion.   Mediastinum/Nodes: AP window lymph nodes are not enlarged by CT size criteria. Hilar regions are difficult to evaluate without IV contrast. No axillary adenopathy. Esophagus is grossly unremarkable.   Lungs/Pleura: Mild centrilobular emphysema. Smoking related respiratory bronchiolitis. Scarring in the right lower lobe. Left lower lobectomy. New or increased peribronchovascular nodularity in the posterior left upper lobe with the largest individual nodule measuring 10 mm (8 x 11 mm, 8/100). Small left fibrothorax. Airway is otherwise unremarkable.   Upper Abdomen: Low-attenuation lesion in the dome of the liver measures 2.6 cm, unchanged and likely a cyst. Visualized portions of the liver, gallbladder and adrenal glands are otherwise unremarkable. 1.4 cm low-attenuation lesion in the upper pole right kidney, incompletely visualized. Visualized portions of the left  kidney, spleen, pancreas, stomach and bowel are unremarkable with the exception of a tiny hiatal hernia. No upper abdominal adenopathy.   Musculoskeletal: Degenerative changes in the spine. Old left rib fractures.   IMPRESSION: 1. New or increased peribronchovascular nodularity in the left lower lobe with the largest individual nodule measuring 10 mm. While findings may be infectious/inflammatory in etiology or due  to aspiration, consider short-term follow-up CT chest without contrast in 4-6 weeks, as clinically indicated, as disease recurrence cannot be excluded. 2. Aortic atherosclerosis (ICD10-I70.0). Coronary artery calcification. 3. Enlarged pulmonic trunk, indicative of pulmonary arterial hypertension. 4.  Emphysema (ICD10-J43.9).     Electronically Signed   By: Lorin Picket M.D.   On: 02/08/2021 15:12 I personally reviewed the CT images.  There is some peribronchovascular nodularity in the lower part of the left upper lobe.  Aortic and coronary atherosclerosis.  Enlarged pulmonary artery.  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  Dr. Servando Snare did a left lower lobectomy for a sarcomatoid carcinoma of the lung in 2016.  Sarcomatous carcinoma left lower lobe.  Status post lobectomy 6 years ago.  No evidence of recurrent disease.  Peribronchovascular nodularity-noted on CT from last week.  Likely infectious/inflammatory in nature.  With a very unusual appearance for malignancy.  We will repeat a scan in 8 weeks to check for clearance.  He asked about empiric antibiotic therapy.  He is not having any fevers, chills, or purulent sputum.  I think observation is her best option.  Plan: Return in 8 weeks with CT chest  I spent over 20 minutes in review of records, images, and in consultation with [name] today. Melrose Nakayama, MD [facility] [phone]

## 2021-02-25 ENCOUNTER — Other Ambulatory Visit: Payer: Self-pay | Admitting: Nurse Practitioner

## 2021-02-25 DIAGNOSIS — I10 Essential (primary) hypertension: Secondary | ICD-10-CM

## 2021-03-02 ENCOUNTER — Other Ambulatory Visit: Payer: Self-pay

## 2021-03-02 ENCOUNTER — Other Ambulatory Visit: Payer: PPO

## 2021-03-02 DIAGNOSIS — I10 Essential (primary) hypertension: Secondary | ICD-10-CM

## 2021-03-02 DIAGNOSIS — E782 Mixed hyperlipidemia: Secondary | ICD-10-CM | POA: Diagnosis not present

## 2021-03-02 DIAGNOSIS — R739 Hyperglycemia, unspecified: Secondary | ICD-10-CM | POA: Diagnosis not present

## 2021-03-03 LAB — COMPLETE METABOLIC PANEL WITH GFR
AG Ratio: 1.6 (calc) (ref 1.0–2.5)
ALT: 13 U/L (ref 9–46)
AST: 15 U/L (ref 10–35)
Albumin: 4.3 g/dL (ref 3.6–5.1)
Alkaline phosphatase (APISO): 69 U/L (ref 35–144)
BUN: 17 mg/dL (ref 7–25)
CO2: 27 mmol/L (ref 20–32)
Calcium: 9.4 mg/dL (ref 8.6–10.3)
Chloride: 105 mmol/L (ref 98–110)
Creat: 0.95 mg/dL (ref 0.70–1.28)
Globulin: 2.7 g/dL (calc) (ref 1.9–3.7)
Glucose, Bld: 100 mg/dL — ABNORMAL HIGH (ref 65–99)
Potassium: 4.5 mmol/L (ref 3.5–5.3)
Sodium: 141 mmol/L (ref 135–146)
Total Bilirubin: 0.5 mg/dL (ref 0.2–1.2)
Total Protein: 7 g/dL (ref 6.1–8.1)
eGFR: 82 mL/min/{1.73_m2} (ref >=60)

## 2021-03-03 LAB — CBC WITH DIFFERENTIAL/PLATELET
Absolute Monocytes: 561 cells/uL (ref 200–950)
Basophils Absolute: 40 cells/uL (ref 0–200)
Basophils Relative: 0.6 %
Eosinophils Absolute: 79 cells/uL (ref 15–500)
Eosinophils Relative: 1.2 %
HCT: 53 % — ABNORMAL HIGH (ref 38.5–50.0)
Hemoglobin: 17.9 g/dL — ABNORMAL HIGH (ref 13.2–17.1)
Lymphs Abs: 1406 cells/uL (ref 850–3900)
MCH: 32.7 pg (ref 27.0–33.0)
MCHC: 33.8 g/dL (ref 32.0–36.0)
MCV: 96.7 fL (ref 80.0–100.0)
MPV: 11.5 fL (ref 7.5–12.5)
Monocytes Relative: 8.5 %
Neutro Abs: 4514 cells/uL (ref 1500–7800)
Neutrophils Relative %: 68.4 %
Platelets: 231 10*3/uL (ref 140–400)
RBC: 5.48 10*6/uL (ref 4.20–5.80)
RDW: 12.8 % (ref 11.0–15.0)
Total Lymphocyte: 21.3 %
WBC: 6.6 10*3/uL (ref 3.8–10.8)

## 2021-03-03 LAB — LIPID PANEL
Cholesterol: 163 mg/dL (ref <200)
HDL: 43 mg/dL (ref >=40)
LDL Cholesterol (Calc): 95 mg/dL (calc)
Non-HDL Cholesterol (Calc): 120 mg/dL (calc) (ref <130)
Total CHOL/HDL Ratio: 3.8 (calc) (ref <5.0)
Triglycerides: 149 mg/dL (ref <150)

## 2021-03-03 LAB — HEMOGLOBIN A1C
Hgb A1c MFr Bld: 5.4 % of total Hgb (ref <5.7)
Mean Plasma Glucose: 108 mg/dL
eAG (mmol/L): 6 mmol/L

## 2021-03-06 ENCOUNTER — Other Ambulatory Visit: Payer: Self-pay

## 2021-03-06 ENCOUNTER — Encounter: Payer: Self-pay | Admitting: Nurse Practitioner

## 2021-03-06 ENCOUNTER — Ambulatory Visit (INDEPENDENT_AMBULATORY_CARE_PROVIDER_SITE_OTHER): Payer: PPO | Admitting: Nurse Practitioner

## 2021-03-06 VITALS — BP 130/70 | HR 64 | Temp 97.2°F | Ht 67.0 in | Wt 227.0 lb

## 2021-03-06 DIAGNOSIS — F17200 Nicotine dependence, unspecified, uncomplicated: Secondary | ICD-10-CM | POA: Diagnosis not present

## 2021-03-06 DIAGNOSIS — E782 Mixed hyperlipidemia: Secondary | ICD-10-CM | POA: Diagnosis not present

## 2021-03-06 DIAGNOSIS — R739 Hyperglycemia, unspecified: Secondary | ICD-10-CM

## 2021-03-06 DIAGNOSIS — D582 Other hemoglobinopathies: Secondary | ICD-10-CM

## 2021-03-06 DIAGNOSIS — R972 Elevated prostate specific antigen [PSA]: Secondary | ICD-10-CM

## 2021-03-06 DIAGNOSIS — I700 Atherosclerosis of aorta: Secondary | ICD-10-CM

## 2021-03-06 DIAGNOSIS — I10 Essential (primary) hypertension: Secondary | ICD-10-CM

## 2021-03-06 DIAGNOSIS — Z902 Acquired absence of lung [part of]: Secondary | ICD-10-CM | POA: Diagnosis not present

## 2021-03-06 DIAGNOSIS — R0602 Shortness of breath: Secondary | ICD-10-CM

## 2021-03-06 MED ORDER — ATORVASTATIN CALCIUM 10 MG PO TABS: 10.0000 mg | ORAL_TABLET | Freq: Every day | ORAL | 3 refills | Status: DC

## 2021-03-06 MED ORDER — BUPROPION HCL ER (XL) 150 MG PO TB24: 150.0000 mg | ORAL_TABLET | Freq: Every day | ORAL | 1 refills | Status: DC

## 2021-03-06 NOTE — Progress Notes (Signed)
Careteam: Patient Care Team: [name], NP as PCP - General (Nurse Practitioner) Druscilla Brownie, MD as Consulting Physician (Dermatology) Milus Banister, MD as Attending Physician (Gastroenterology)  PLACE OF SERVICE:  [place] Directive information Does Patient Have a Medical Advance Directive?: Yes, Type of Advance Directive: Out of facility DNR (pink MOST or yellow form), Pre-existing out of facility DNR order (yellow form or pink MOST form): Pink MOST form placed in chart (order not valid for inpatient use);Yellow form placed in chart (order not valid for inpatient use), Does patient want to make changes to medical advance directive?: No - Patient declined  Allergies  Allergen Reactions   Asa [Aspirin] Anaphylaxis and Swelling    Angioedema/Eyes and lip swelling   Nsaids Hives   Tolmetin Hives   Naproxen Hives    Chief Complaint  Patient presents with   Medical Management of Chronic Issues    6 month follow up. Review labs. Would like to discuss CT results   Health Maintenance    2nd COVID booster, Flu vaccine     HPI: Patient is a 80 y.o. male for routine follow up.   Went to cardiothoracic surgery follow up.  He went for Ct scan and then followed up with Dr Gorden Harms (new doctor because previous surgeon retired) He reports that he was told there was something back in his lungs. Has a follow up CT scheduled in 4-6 weeks with contract to evaluation further and then follow up again with Dr Gorden Harms.   Reports shortness of breath. He continues to smoke. Has shortness of breath on exertion but it does not stop him from doing anything. Ongoing cough and congestion (occasional) no changes in this.  Does not follow up with pulmonary.  Ct notes enlarged pulmonic truck, indicative of pulmonary arterial hypertension.   Reports back is weak- no pain. Hard to do anything for prolong periods due to weakness.   Continues to smoke- has bough kits at the  drug store. Went to a hypnosis in the past. Reports he enjoys smoking so he does not really want to quit. Generally smokes 1/2 to 1 ppd.  Willing to take medication to help at this time.   Review of Systems:  Review of Systems  Constitutional:  Negative for chills, fever and weight loss.  HENT:  Negative for tinnitus.   Respiratory:  Positive for cough, sputum production and shortness of breath.   Cardiovascular:  Negative for chest pain, palpitations and leg swelling.  Gastrointestinal:  Negative for abdominal pain, constipation, diarrhea and heartburn.  Genitourinary:  Negative for dysuria, frequency and urgency.  Musculoskeletal:  Negative for back pain, falls, joint pain and myalgias.  Skin: Negative.   Neurological:  Positive for weakness. Negative for dizziness and headaches.  Psychiatric/Behavioral:  Negative for depression and memory loss. The patient does not have insomnia.    Past Medical History:  Diagnosis Date   A-fib [facility])    Acute appendicitis with peritoneal abscess 01/28/2013   Allergy    seasonal    Anxiety    Arthritis    Bacteremia September 2015   Elevated PSA    Essential hypertension 04/16/2014   Heart murmur    childhood   Ileus, postoperative ([place]) 01/28/2013   Lung cancer ([place]) 2016   Obesity, morbid ([place]) 01/28/2013   Shortness of breath    Tobacco use disorder 01/28/2013   Past Surgical History:  Procedure Laterality Date   APPENDECTOMY     BACK  SURGERY  1997, 2003   Dr.Apleton    CARPAL TUNNEL RELEASE     COLONOSCOPY     x 3   EYE SURGERY Left    muscle of the eye    KNEE SURGERY Bilateral    LAPAROSCOPIC APPENDECTOMY N/A 01/25/2013   Procedure: APPENDECTOMY LAPAROSCOPIC;  Surgeon: Ralene Ok, MD;  Location: [facility];  Service: General;  Laterality: N/A;   LYMPH NODE DISSECTION Left 04/26/2015   Procedure: LYMPH NODE DISSECTION;  Surgeon: Grace Isaac, MD;  Location: [facility];  Service: Thoracic;  Laterality: Left;   POLYPECTOMY      SHOULDER SURGERY Right 2014   Dr.Whitfield rotator cuff surgery   SKIN SURGERY Right 01/2019   Right Leg, cancerous area removed   TEE WITHOUT CARDIOVERSION N/A 04/07/2014   Procedure: TRANSESOPHAGEAL ECHOCARDIOGRAM (TEE);  Surgeon: Candee Furbish, MD;  Location: [facility] ENDOSCOPY;  Service: Cardiovascular;  Laterality: N/A;   VIDEO ASSISTED THORACOSCOPY (VATS)/WEDGE RESECTION Left 04/26/2015   Procedure: LEFT VIDEO ASSISTED THORACOSCOPY (VATS) WITH LEFT LOWER LOBECTOMY;  Surgeon: Grace Isaac, MD;  Location: [facility];  Service: Thoracic;  Laterality: Left;   VIDEO BRONCHOSCOPY N/A 04/26/2015   Procedure: VIDEO BRONCHOSCOPY;  Surgeon: Grace Isaac, MD;  Location: [facility];  Service: Thoracic;  Laterality: N/A;   Social History:   reports that he has been smoking cigarettes and cigars. He started smoking about 62 years ago. He has a 30.00 pack-year smoking history. He quit smokeless tobacco use about 17 years ago.  His smokeless tobacco use included chew. He reports that he does not drink alcohol and does not use drugs.  Family History  Problem Relation Age of Onset   Irritable bowel syndrome Mother    Cancer Neg Hx    Lung disease Neg Hx    Colon cancer Neg Hx    Colon polyps Neg Hx    Esophageal cancer Neg Hx    Rectal cancer Neg Hx    Stomach cancer Neg Hx     Medications: Patient's Medications  New Prescriptions   No medications on file  Previous Medications   HYDRALAZINE (APRESOLINE) 25 MG TABLET    TAKE 1 TABLET BY MOUTH THREE TIMES DAILY FOR BLOOD PRESSURE   LOSARTAN (COZAAR) 50 MG TABLET    TAKE 1 TABLET BY MOUTH EVERY DAY FOR BLOOD PRESSURE   METOPROLOL TARTRATE (LOPRESSOR) 25 MG TABLET    TAKE 1/2 TABLET BY MOUTH TWICE DAILY  Modified Medications   No medications on file  Discontinued Medications   No medications on file    Physical Exam:  Vitals:   03/06/21 0934 03/06/21 1007  BP: 140/82 130/70  Pulse: 64   Temp: (!) 97.2 F (36.2 C)   TempSrc: Temporal   SpO2:  96%   Weight: 227 lb (103 kg)   Height: _0  (1.702 m)    Body mass index is 35.55 kg/m. Wt Readings from Last 3 Encounters:  03/06/21 227 lb (103 kg)  02/14/21 223 lb (101.2 kg)  11/07/20 228 lb (103.4 kg)    Physical Exam Constitutional:      General: He is not in acute distress.    Appearance: He is well-developed. He is not diaphoretic.  HENT:     Head: Normocephalic and atraumatic.     Right Ear: External ear normal.     Left Ear: External ear normal.     Mouth/Throat:     Pharynx: No oropharyngeal exudate.  Eyes:     Conjunctiva/sclera: Conjunctivae  normal.     Pupils: Pupils are equal, round, and reactive to light.  Cardiovascular:     Rate and Rhythm: Normal rate and regular rhythm.     Heart sounds: Normal heart sounds.  Pulmonary:     Effort: Pulmonary effort is normal.     Breath sounds: Wheezing (throughout) present.  Abdominal:     General: Bowel sounds are normal.     Palpations: Abdomen is soft.  Musculoskeletal:        General: No tenderness.     Cervical back: Normal range of motion and neck supple.     Right lower leg: No edema.     Left lower leg: No edema.  Skin:    General: Skin is warm and dry.  Neurological:     Mental Status: He is alert and oriented to person, place, and time.    Labs reviewed: Basic Metabolic Panel: Recent Labs    09/01/20 0810 03/02/21 0804  NA 144 141  K 5.0 4.5  CL 107 105  CO2 30 27  GLUCOSE 109* 100*  BUN 21 17  CREATININE 0.94 0.95  CALCIUM 9.6 9.4   Liver Function Tests: Recent Labs    03/02/21 0804  AST 15  ALT 13  BILITOT 0.5  PROT 7.0   No results for input(s): LIPASE, AMYLASE in the last 8760 hours. No results for input(s): AMMONIA in the last 8760 hours. CBC: Recent Labs    09/01/20 0810 03/02/21 0804  WBC 7.7 6.6  NEUTROABS 5,336 4,514  HGB 17.5* 17.9*  HCT 52.6* 53.0*  MCV 99.1 96.7  PLT 212 231   Lipid Panel: Recent Labs    09/01/20 0810 03/02/21 0804  CHOL 153 163  HDL  39* 43  LDLCALC 86 95  TRIG 180* 149  CHOLHDL 3.9 3.8   TSH: No results for input(s): TSH in the last 8760 hours. A1C: Lab Results  Component Value Date   HGBA1C 5.4 03/02/2021     Assessment/Plan 1. Essential hypertension --stable. Goal bp <140/90. Continue on losartan and hydralazine and metoprolol with low sodium diet.  - CBC with Differential/Platelet; Future - CMP with eGFR([facility]); Future  2. Tobacco use disorder Encouraged cessation. He is willing to try wellbutrin at this time. - buPROPion (WELLBUTRIN XL) 150 MG 24 hr tablet; Take 1 tablet (150 mg total) by mouth daily.  Dispense: 30 tablet; Refill: 1  3. Mixed hyperlipidemia -continues on lipitor. LDL at goal. Continue dietary modifications with medication management.  - atorvastatin (LIPITOR) 10 MG tablet; Take 1 tablet (10 mg total) by mouth daily.  Dispense: 90 tablet; Refill: 3 - CMP with eGFR([facility]); Future - Lipid Panel; Future  4. S/P lobectomy of lung -due to cancer, continues to follow up with surgery, recent finding on last CT, plans to follow up in 4 weeks.  5. Obesity, morbid ([place]) -education provided on healthy weight loss through increase in physical activity and proper nutrition   6. Elevated PSA -recently stable. Continue to follow. - PSA; Future  7. Aortic atherosclerosis ([place]) Continues on statin. Allergy to ASA - Lipid Panel; Future  8. Shortness of breath Ongoing, based on recent CT lung will send to pulmonary for further evaluation.  -smoking cessation encouraged.  - Ambulatory referral to Pulmonology  9. Hyperglycemia -continue dietary modifications. - Hemoglobin A1c; Future  10. Elevated hemoglobin ([facility]) -ongoing, likely secondary to smoking. - CBC with Differential/Platelet; Future   Next appt: 6 weeks for Wellbutrin start/smoking cessation Tynasia Mccaul K. Harle Battiest  [facility] [phone]

## 2021-03-06 NOTE — Patient Instructions (Signed)
To start Wellbutrin today, plan a stop day for smoking and decrease your cigarettes until that date, then stop.  Continue Wellbutrin Follow up in 6 weeks on smoking cessation.   Start lipitor, start 1/2 tablet for 2 weeks then increase to whole tablet- for cholesterol and heart disease.  Continue low fat, heart healthy diet.   Referral to lung doctors made for further evaluation.   Consider physical therapy for back.

## 2021-04-03 ENCOUNTER — Encounter: Payer: Self-pay | Admitting: Nurse Practitioner

## 2021-04-03 ENCOUNTER — Ambulatory Visit (INDEPENDENT_AMBULATORY_CARE_PROVIDER_SITE_OTHER): Payer: PPO | Admitting: Nurse Practitioner

## 2021-04-03 ENCOUNTER — Other Ambulatory Visit: Payer: Self-pay

## 2021-04-03 VITALS — BP 134/70 | HR 69 | Temp 97.5°F | Ht 67.0 in | Wt 228.0 lb

## 2021-04-03 DIAGNOSIS — R0602 Shortness of breath: Secondary | ICD-10-CM

## 2021-04-03 DIAGNOSIS — F17200 Nicotine dependence, unspecified, uncomplicated: Secondary | ICD-10-CM | POA: Diagnosis not present

## 2021-04-03 DIAGNOSIS — I10 Essential (primary) hypertension: Secondary | ICD-10-CM | POA: Diagnosis not present

## 2021-04-03 DIAGNOSIS — E782 Mixed hyperlipidemia: Secondary | ICD-10-CM

## 2021-04-03 DIAGNOSIS — Z23 Encounter for immunization: Secondary | ICD-10-CM | POA: Diagnosis not present

## 2021-04-03 MED ORDER — LOSARTAN POTASSIUM 50 MG PO TABS: ORAL_TABLET | ORAL | 3 refills | Status: DC

## 2021-04-03 MED ORDER — ATORVASTATIN CALCIUM 10 MG PO TABS: 10.0000 mg | ORAL_TABLET | Freq: Every day | ORAL | 3 refills | Status: DC

## 2021-04-03 NOTE — Patient Instructions (Signed)
Decrease wellbutrin to every other day for 1 week then every 3rd day until gone.   Continue lipitor 10 mg daily

## 2021-04-03 NOTE — Progress Notes (Signed)
Careteam: Patient Care Team: [name], NP as PCP - General (Nurse Practitioner) Druscilla Brownie, MD as Consulting Physician (Dermatology) Milus Banister, MD as Attending Physician (Gastroenterology)  PLACE OF SERVICE:  [place] Directive information Does Patient Have a Medical Advance Directive?: Yes, Type of Advance Directive: Out of facility DNR (pink MOST or yellow form), Pre-existing out of facility DNR order (yellow form or pink MOST form): Yellow form placed in chart (order not valid for inpatient use);Pink MOST form placed in chart (order not valid for inpatient use), Does patient want to make changes to medical advance directive?: No - Patient declined  Allergies  Allergen Reactions   Asa [Aspirin] Anaphylaxis and Swelling    Angioedema/Eyes and lip swelling   Nsaids Hives   Tolmetin Hives   Naproxen Hives    Chief Complaint  Patient presents with   Follow-up    6 week follow-up on smoking cessation. Discuss Lipitor and Wellbutrin.      HPI: Patient is a 80 y.o. male for follow up on smoking cessation.   Does not want to continue wellbutrin- he has not smoked in over a month. Quit last month cold [place]. Started wellbutrin and has been on for a month. This time he wanted to quit smoking and he prayed over it to help him quit. Would like to come off medications.   Started lipitor 1/2 tablet and then increase to whole tablet. Tolerating well. No myalgias.   Referral made for pulmonary- he has appt for Oct 5th.  He is doing much better since he quit smoking.  Breathing has improved. No wheezing  Not coughing at this time Food taste better.  Continues to want a cigarette but has held off.   He has eaten today.    Continues with weakness with back- doing exercises which has been helping.  Review of Systems:  Review of Systems  Constitutional:  Negative for chills, fever and weight loss.  HENT:  Negative for tinnitus.   Respiratory:   Negative for cough, sputum production and shortness of breath.   Cardiovascular:  Negative for chest pain, palpitations and leg swelling.  Gastrointestinal:  Negative for abdominal pain, constipation, diarrhea and heartburn.  Genitourinary:  Negative for dysuria, frequency and urgency.  Musculoskeletal:  Negative for back pain, falls, joint pain and myalgias.  Skin: Negative.   Neurological:  Negative for dizziness and headaches.  Psychiatric/Behavioral:  Negative for depression and memory loss. The patient does not have insomnia.    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   Obesity, morbid ([place]) 01/28/2013   Shortness of breath    Tobacco use disorder 01/28/2013   Past Surgical History:  Procedure Laterality Date   APPENDECTOMY     BACK SURGERY  1997, 2003   Dr.Apleton    CARPAL TUNNEL RELEASE     COLONOSCOPY     x 3   EYE SURGERY Left    muscle of the eye    KNEE SURGERY Bilateral    LAPAROSCOPIC APPENDECTOMY N/A 01/25/2013   Procedure: APPENDECTOMY LAPAROSCOPIC;  Surgeon: Ralene Ok, MD;  Location: [place];  Service: General;  Laterality: N/A;   LYMPH NODE DISSECTION Left 04/26/2015   Procedure: LYMPH NODE DISSECTION;  Surgeon: Grace Isaac, MD;  Location: [place];  Service: Thoracic;  Laterality: Left;   POLYPECTOMY     SHOULDER SURGERY Right 2014   Dr.Whitfield rotator cuff surgery   SKIN SURGERY Right 01/2019   Right Leg, cancerous area removed   TEE WITHOUT CARDIOVERSION N/A 04/07/2014   Procedure: TRANSESOPHAGEAL ECHOCARDIOGRAM (TEE);  Surgeon: Candee Furbish, MD;  [facility] END[facility]COPY;  Service: Cardiovascular;  Laterality: N/A;   VIDEO ASSISTED THORACOSCOPY (VATS)/WEDGE  RESECTION Left 04/26/2015   Procedure: LEFT VIDEO ASSISTED THORACOSCOPY (VATS) WITH LEFT LOWER LOBECTOMY;  Surgeon: Grace Isaac, MD;  Location: [place];  Service: Thoracic;  Laterality: Left;   VIDEO BRONCHOSCOPY N/A 04/26/2015   Procedure: VIDEO BRONCHOSCOPY;  Surgeon: Grace Isaac, MD;  Location: [place];  Service: Thoracic;  Laterality: N/A;   Social History:   reports that he quit smoking about 4 weeks ago. His smoking use included cigarettes and cigars. He started smoking about 62 years ago. He has a 30.00 pack-year smoking history. He quit smokeless tobacco use about 17 years ago.  His smokeless tobacco use included chew. He reports that he does not drink alcohol and does not use drugs.  Family History  Problem Relation Age of Onset   Irritable bowel syndrome Mother    Cancer Neg Hx    Lung disease Neg Hx    Colon cancer Neg Hx    Colon polyps Neg Hx    Esophageal cancer Neg Hx    Rectal cancer Neg Hx    Stomach cancer Neg Hx     Medications: Patient's Medications  New Prescriptions   No medications on file  Previous Medications   ATORVASTATIN (LIPITOR) 10 MG TABLET    Take 1 tablet (10 mg total) by mouth daily.   BUPROPION (WELLBUTRIN XL) 150 MG 24 HR TABLET    Take 1 tablet (150 mg total) by mouth daily.   HYDRALAZINE (APRESOLINE) 25 MG TABLET    TAKE 1 TABLET BY MOUTH THREE TIMES DAILY FOR BLOOD PRESSURE   LOSARTAN (COZAAR) 50 MG TABLET    TAKE 1 TABLET BY MOUTH EVERY DAY FOR BLOOD PRESSURE   METOPROLOL TARTRATE (LOPRESSOR) 25 MG TABLET    TAKE 1/2 TABLET BY MOUTH TWICE DAILY  Modified Medications   No medications on file  Discontinued Medications   No medications on file    Physical Exam:  Vitals:   04/03/21 0955  BP: 134/70  Pulse: 69  Temp: (!) 97.5 F (36.4 C)  TempSrc: Temporal  SpO2: 95%  Weight: 228 lb (103.4 kg)  Height: _0  (1.702 m)   Body mass index is 35.71 kg/m. Wt Readings from Last 3 Encounters:  04/03/21 228 lb (103.4 kg)   03/06/21 227 lb (103 kg)  02/14/21 223 lb (101.2 kg)    Physical Exam Constitutional:      General: He is not in acute distress.    Appearance: He is well-developed. He is not diaphoretic.  HENT:     Head: Normocephalic and atraumatic.     Right Ear: External ear normal.     Left Ear: External ear normal.     Mouth/Throat:     Pharynx: No oropharyngeal exudate.  Eyes:     Conjunctiva/sclera: Conjunctivae normal.     Pupils: Pupils are equal, round, and reactive to light.  Cardiovascular:     Rate and Rhythm: Normal rate  and regular rhythm.     Heart sounds: Normal heart sounds.  Pulmonary:     Effort: Pulmonary effort is normal.     Breath sounds: Normal breath sounds.  Abdominal:     General: Bowel sounds are normal.     Palpations: Abdomen is soft.  Musculoskeletal:        General: No tenderness.     Cervical back: Normal range of motion and neck supple.     Right lower leg: No edema.     Left lower leg: No edema.  Skin:    General: Skin is warm and dry.  Neurological:     Mental Status: He is alert and oriented to person, place, and time.  Psychiatric:        Mood and Affect: Mood normal.    Labs reviewed: Basic Metabolic Panel: Recent Labs    09/01/20 0810 03/02/21 0804  NA 144 141  K 5.0 4.5  CL 107 105  CO2 30 27  GLUCOSE 109* 100*  BUN 21 17  CREATININE 0.94 0.95  CALCIUM 9.6 9.4   Liver Function Tests: Recent Labs    03/02/21 0804  AST 15  ALT 13  BILITOT 0.5  PROT 7.0   No results for input(s): LIPASE, AMYLASE in the last 8760 hours. No results for input(s): AMMONIA in the last 8760 hours. CBC: Recent Labs    09/01/20 0810 03/02/21 0804  WBC 7.7 6.6  NEUTROABS 5,336 4,514  HGB 17.5* 17.9*  HCT 52.6* 53.0*  MCV 99.1 96.7  PLT 212 231   Lipid Panel: Recent Labs    09/01/20 0810 03/02/21 0804  CHOL 153 163  HDL 39* 43  LDLCALC 86 95  TRIG 180* 149  CHOLHDL 3.9 3.8   TSH: No results for input(s): TSH in the last 8760  hours. A1C: Lab Results  Component Value Date   HGBA1C 5.4 03/02/2021     Assessment/Plan 1. Essential hypertension -controlled on current regimen.  - losartan (COZAAR) 50 MG tablet; TAKE 1 TABLET BY MOUTH EVERY DAY FOR BLOOD PRESSURE  Dispense: 90 tablet; Refill: 3  2. Need for influenza vaccination - Flu Vaccine QUAD High Dose(Fluad)  3. Mixed hyperlipidemia -tolerating lipitor at this time. Continue low fat diet.  - atorvastatin (LIPITOR) 10 MG tablet; Take 1 tablet (10 mg total) by mouth daily.  Dispense: 90 tablet; Refill: 3 - CMP with eGFR([facility]) - Lipid Panel  4. Tobacco use disorder -he has quit smoking at this time. Congratulated on cessation   5. Shortness of breath -improved since quitting smoking.    Next appt: 6 months, labs prior.  Carlos American. [place], [facility] [phone]

## 2021-04-04 LAB — LIPID PANEL
Cholesterol: 129 mg/dL (ref <200)
HDL: 39 mg/dL — ABNORMAL LOW (ref >=40)
LDL Cholesterol (Calc): 59 mg/dL (calc)
Non-HDL Cholesterol (Calc): 90 mg/dL (calc) (ref <130)
Total CHOL/HDL Ratio: 3.3 (calc) (ref <5.0)
Triglycerides: 246 mg/dL — ABNORMAL HIGH (ref <150)

## 2021-04-04 LAB — COMPLETE METABOLIC PANEL WITH GFR
AG Ratio: 1.5 (calc) (ref 1.0–2.5)
ALT: 16 U/L (ref 9–46)
AST: 17 U/L (ref 10–35)
Albumin: 4.1 g/dL (ref 3.6–5.1)
Alkaline phosphatase (APISO): 76 U/L (ref 35–144)
BUN: 23 mg/dL (ref 7–25)
CO2: 27 mmol/L (ref 20–32)
Calcium: 9.4 mg/dL (ref 8.6–10.3)
Chloride: 105 mmol/L (ref 98–110)
Creat: 1.14 mg/dL (ref 0.70–1.28)
Globulin: 2.7 g/dL (calc) (ref 1.9–3.7)
Glucose, Bld: 120 mg/dL (ref 65–139)
Potassium: 4.8 mmol/L (ref 3.5–5.3)
Sodium: 141 mmol/L (ref 135–146)
Total Bilirubin: 0.5 mg/dL (ref 0.2–1.2)
Total Protein: 6.8 g/dL (ref 6.1–8.1)
eGFR: 66 mL/min/{1.73_m2} (ref >=60)

## 2021-04-11 ENCOUNTER — Ambulatory Visit: Payer: PPO | Admitting: Thoracic Surgery (Cardiothoracic Vascular Surgery)

## 2021-04-11 ENCOUNTER — Ambulatory Visit
Admission: RE | Admit: 2021-04-11 | Discharge: 2021-04-11 | Disposition: A | Discharge status: Home / Self Care | Payer: PPO | Source: Ambulatory Visit | Attending: Thoracic Surgery (Cardiothoracic Vascular Surgery) | Admitting: Thoracic Surgery (Cardiothoracic Vascular Surgery)

## 2021-04-11 ENCOUNTER — Other Ambulatory Visit: Payer: Self-pay

## 2021-04-11 VITALS — BP 155/65 | HR 61 | Resp 20 | Ht 67.0 in | Wt 228.0 lb

## 2021-04-11 DIAGNOSIS — C3490 Malignant neoplasm of unspecified part of unspecified bronchus or lung: Secondary | ICD-10-CM | POA: Diagnosis not present

## 2021-04-11 DIAGNOSIS — J439 Emphysema, unspecified: Secondary | ICD-10-CM | POA: Diagnosis not present

## 2021-04-11 DIAGNOSIS — Z85118 Personal history of other malignant neoplasm of bronchus and lung: Secondary | ICD-10-CM | POA: Diagnosis not present

## 2021-04-11 DIAGNOSIS — I700 Atherosclerosis of aorta: Secondary | ICD-10-CM | POA: Diagnosis not present

## 2021-04-11 NOTE — Progress Notes (Signed)
[address]       [place] [address]             [phone]     HPI: [name] returns for follow-up of pulmonary nodularity seen on recent CT  [name] is a 80 year old man with a past history of tobacco abuse, sarcomatoid carcinoma of the lung, left lower lobectomy, hypertension, atrial fibrillation, arthritis, heart murmur, and anxiety.  He had a left lower lobectomy by Dr. Servando Snare in 2016.  I recently saw him back with a CT of the chest.  It showed peribronchovascular nodularity in the inferior aspect of the left upper lobe.  He now returns with a repeat scan to reassess that area.  In the interim since his last visit he quit smoking.  He feels much better since he did.  He says he noted a difference in his breathing within a few days.  Past Medical History:  Diagnosis Date   A-fib [facility])    Acute appendicitis with peritoneal abscess 01/28/2013   Allergy    seasonal    Anxiety    Arthritis    Bacteremia September 2015   Elevated PSA    Essential hypertension 04/16/2014   Heart murmur    childhood   Ileus, postoperative ([place]) 01/28/2013   Lung cancer ([place]) 2016   Obesity, morbid ([place]) 01/28/2013   Shortness of breath    Tobacco use disorder 01/28/2013    Current Outpatient Medications  Medication Sig Dispense Refill   atorvastatin (LIPITOR) 10 MG tablet Take 1 tablet (10 mg total) by mouth daily. 90 tablet 3   hydrALAZINE (APRESOLINE) 25 MG tablet TAKE 1 TABLET BY MOUTH THREE TIMES DAILY FOR BLOOD PRESSURE 270 tablet 1   losartan (COZAAR) 50 MG tablet TAKE 1 TABLET BY MOUTH EVERY DAY FOR BLOOD PRESSURE 90 tablet 3   metoprolol tartrate (LOPRESSOR) 25 MG tablet TAKE 1/2 TABLET BY MOUTH TWICE DAILY 90 tablet 1   No current facility-administered medications for this visit.    Physical Exam BP (!) 155/65   Pulse 61   Resp 20   Ht _0  (1.702 m)   Wt 228 lb (103.4 kg)   SpO2 93% Comment: RA  BMI 35.64 kg/m  80 year old man in no acute  distress Alert and oriented x3 with no focal deficits Lungs diminished at left base otherwise clear  Diagnostic Tests: CT CHEST WITHOUT CONTRAST   TECHNIQUE: Multidetector CT imaging of the chest was performed following the standard protocol without IV contrast.   COMPARISON:  Numerous prior chest CTs. The most recent is 02/08/2021   FINDINGS: Cardiovascular: The heart is normal in size. No pericardial effusion. Stable atherosclerotic calcifications involving the thoracic aorta and coronary arteries.   Mediastinum/Nodes: Stable small scattered mediastinal and hilar lymph nodes but no mass or overt adenopathy. The esophagus is unremarkable.   Lungs/Pleura: Stable surgical changes involving the left lung from previous wedge resection. No findings suspicious for recurrent tumor. Stable underlying emphysematous changes and areas of pulmonary scarring. The bronchovascular nodularity described on the prior examination is much improved and this could represent a chronic inflammatory process or atypical infectious process such as MAC. No pulmonary nodules suspicious for pulmonary metastatic disease. No acute pulmonary infiltrates and no pleural effusions.   Upper Abdomen: No significant upper abdominal findings. Stable left hepatic lobe cyst. No adrenal gland lesions.   Musculoskeletal: No significant bony findings. Stable degenerative changes involving the spine. Remote healed left rib fractures are  again noted. Stable appearing subcutaneous sebaceous cysts involving the posterior thorax.   IMPRESSION: 1. Stable surgical changes involving the left lung from previous wedge resection. No findings suspicious for recurrent tumor or metastatic disease. 2. Stable emphysematous changes and areas of pulmonary scarring. 3. Significant interval improvement in the bronchovascular nodularity described on the prior examination. This could represent a chronic inflammatory process or atypical  infectious process such as MAC. 4. Stable atherosclerotic calcifications involving the aorta and coronary arteries.   Aortic Atherosclerosis (ICD10-I70.0) and Emphysema (ICD10-J43.9).     Electronically Signed   By: Marijo Sanes M.D.   On: 04/11/2021 13:43 I personally reviewed the CT images.  There is been significant improvement in the nodularity noted on his previous scan.  Almost to the point of complete resolution.  Impression: [name] is a 80 year old man with a past history of tobacco abuse, sarcomatoid carcinoma of the lung, left lower lobectomy, hypertension, atrial fibrillation, arthritis, heart murmur, and anxiety.  He recently had a CT for an annual follow-up.  There was no evidence of recurrent carcinoma.  However, there was significant peribronchovascular nodularity in the left base.  Now on follow-up CT in 8 weeks that has nearly completely resolved and there are no suspicious nodules.  Tobacco abuse-I congratulated him on quitting smoking.  He seems very motivated to stay off of tobacco.  Plan: Return in 1 year with CT chest  Melrose Nakayama, MD [facility] [phone]

## 2021-04-19 ENCOUNTER — Other Ambulatory Visit: Payer: Self-pay

## 2021-04-19 ENCOUNTER — Ambulatory Visit: Payer: PPO | Admitting: Pulmonary Disease

## 2021-04-19 ENCOUNTER — Encounter: Payer: Self-pay | Admitting: Pulmonary Disease

## 2021-04-19 VITALS — BP 150/62 | HR 63 | Temp 97.9°F | Ht 67.0 in | Wt 230.2 lb

## 2021-04-19 DIAGNOSIS — R0609 Other forms of dyspnea: Secondary | ICD-10-CM | POA: Diagnosis not present

## 2021-04-19 MED ORDER — TRELEGY ELLIPTA 100-62.5-25 MCG/INH IN AEPB: 1.0000 | INHALATION_SPRAY | Freq: Every day | RESPIRATORY_TRACT | 0 refills | Status: DC

## 2021-04-19 NOTE — Patient Instructions (Signed)
Shortness of breath with exertion  We will get a breathing study to compare with the one he had before  We will get an ultrasound of your heart to compare with the one before as well  Start you on an inhaler  Trelegy-use once daily, always make sure you rinse your mouth afterwards  Albuterol to be used as needed-2 puffs as needed up to 4 times a day  Regular exercises, graded exercises as tolerated  I will see you in 6 to 8 weeks  Call with significant concerns

## 2021-04-19 NOTE — Progress Notes (Signed)
[name]    [id number]    01-09-1941  Primary Care Physician:E[place], Carlos American, NP  Referring Physician: [name], NP [place],  [place] [address]  Chief complaint:   Patient is being evaluated for shortness of breath  HPI:  History of significant shortness of breath with exertion  His most significant limitation appears to be his back pain for which he had 3 back surgeries in the past He gets short of breath with moderate exertion  He recently quit smoking about end of August 2022, smoked over a pack a day for many years-over 55-pack-year smoking history History of lung cancer for which he had his left lower lobe removed in 2016  History of hypertension, atrial fibrillation, hypercholesterolemia Multiple back surgeries as stated above  Not currently on any inhalers  Worked for a tobacco company, also did plumbing in the past   No pets   Outpatient Encounter Medications as of 04/19/2021  Medication Sig   atorvastatin (LIPITOR) 10 MG tablet Take 1 tablet (10 mg total) by mouth daily.   Fluticasone-Umeclidin-Vilant (TRELEGY ELLIPTA) 100-62.5-25 MCG/INH AEPB Inhale 1 puff into the lungs daily.   hydrALAZINE (APRESOLINE) 25 MG tablet TAKE 1 TABLET BY MOUTH THREE TIMES DAILY FOR BLOOD PRESSURE   losartan (COZAAR) 50 MG tablet TAKE 1 TABLET BY MOUTH EVERY DAY FOR BLOOD PRESSURE   metoprolol tartrate (LOPRESSOR) 25 MG tablet TAKE 1/2 TABLET BY MOUTH TWICE DAILY   No facility-administered encounter medications on file as of 04/19/2021.    Allergies as of 04/19/2021 - Review Complete 04/19/2021  Allergen Reaction Noted   Asa [aspirin] Anaphylaxis and Swelling 12/01/2014   Nsaids Hives 06/03/2015   Tolmetin Hives 06/03/2015   Naproxen Hives 06/03/2015    Past Medical History:  Diagnosis Date   A-fib [facility])    Acute appendicitis with peritoneal abscess 01/28/2013   Allergy    seasonal    Anxiety    Arthritis    Bacteremia  September 2015   Elevated PSA    Essential hypertension 04/16/2014   Heart murmur    childhood   Ileus, postoperative ([place]) 01/28/2013   Lung cancer ([place]) 2016   Obesity, morbid ([place]) 01/28/2013   Shortness of breath    Tobacco use disorder 01/28/2013    Past Surgical History:  Procedure Laterality Date   APPENDECTOMY     BACK SURGERY  1997, 2003   Dr.Apleton    CARPAL TUNNEL RELEASE     COLONOSCOPY     x 3   EYE SURGERY Left    muscle of the eye    KNEE SURGERY Bilateral    LAPAROSCOPIC APPENDECTOMY N/A 01/25/2013   Procedure: APPENDECTOMY LAPAROSCOPIC;  Surgeon: Ralene Ok, MD;  Location: [place];  Service: General;  Laterality: N/A;   LYMPH NODE DISSECTION Left 04/26/2015   Procedure: LYMPH NODE DISSECTION;  Surgeon: Grace Isaac, MD;  Location: [place];  Service: Thoracic;  Laterality: Left;   POLYPECTOMY     SHOULDER SURGERY Right 2014   Dr.Whitfield rotator cuff surgery   SKIN SURGERY Right 01/2019   Right Leg, cancerous area removed   TEE WITHOUT CARDIOVERSION N/A 04/07/2014   Procedure: TRANSESOPHAGEAL ECHOCARDIOGRAM (TEE);  Surgeon: Candee Furbish, MD;  Location: [facility] ENDOSCOPY;  Service: Cardiovascular;  Laterality: N/A;   VIDEO ASSISTED THORACOSCOPY (VATS)/WEDGE RESECTION Left 04/26/2015   Procedure: LEFT VIDEO ASSISTED THORACOSCOPY (VATS) WITH LEFT LOWER LOBECTOMY;  Surgeon: Grace Isaac, MD;  [facility] OR;  Service: Thoracic;  Laterality: Left;   VIDEO BRONCHOSCOPY N/A 04/26/2015   Procedure: VIDEO BRONCHOSCOPY;  Surgeon: Grace Isaac, MD;  Location: [facility] OR;  Service: Thoracic;  Laterality: N/A;    Family History  Problem Relation Age of Onset   Irritable bowel syndrome Mother    Cancer Neg Hx    Lung disease Neg Hx    Colon cancer Neg Hx    Colon polyps Neg Hx    Esophageal cancer Neg Hx    Rectal cancer Neg Hx    Stomach cancer Neg Hx     Social History   Socioeconomic History   Marital status: Divorced    Spouse name: Not on file    Number of children: 0   Years of education: Not on file   Highest education level: Not on file  Occupational History   Occupation: Retired  Tobacco Use   Smoking status: Former    Packs/day: 0.50    Years: 60.00    Pack years: 30.00    Types: Cigarettes, Cigars    Start date: 07/16/1958    Quit date: 03/03/2021    Years since quitting: 0.1   Smokeless tobacco: Former    Types: Chew    Quit date: 07/17/2003   Tobacco comments:    Quit smoking cigarettes about age 89   Vaping Use   Vaping Use: Former   Quit date: 07/16/2008  Substance and Sexual Activity   Alcohol use: No    Alcohol/week: 0.0 standard drinks   Drug use: No   Sexual activity: Not Currently  Other Topics Concern   Not on file  Social History Narrative   No diet   Yes, eats/drinks things with caffeine    Divorced, married 1979   Lives in a house, one stories , one person, no pets   Current/past profession- [employer]   Patient exercises, golf 3 days weekly        Originally from [place]. Previously lived in [facility] for [place] years and moved back to [place] in 1968. No international travel. He has traveled through multiple states traveling to [place]. 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 Unsure if he was exposed to any asbestos. No mold exposure. No pets currently. Previously has owned dogs. Parakeet as a child. No hot tub exposure. Enjoys golfing.   Social Determinants of Health   Financial Resource Strain: Not on file  Food Insecurity: Not on file  Transportation Needs: Not on file  Physical Activity: Not on file  Stress: Not on file  Social Connections: Not on file  Intimate Partner Violence: Not on file    Review of Systems  Respiratory:  Positive for shortness of breath.   Musculoskeletal:  Positive for back pain.  Psychiatric/Behavioral:  Negative for sleep disturbance.    Vitals:   04/19/21 1341  BP: (!) 150/62  Pulse: 63  Temp: 97.9 F  (36.6 C)  SpO2: 99%     Physical Exam Constitutional:      Appearance: He is obese.  HENT:     Head: Normocephalic.     Right Ear: Tympanic membrane normal.     Nose: No congestion.  Eyes:     Pupils: Pupils are equal, round, and reactive to light.  Cardiovascular:     Rate and Rhythm: Normal rate and regular rhythm.     Pulses: Normal pulses.     Heart sounds: No murmur  heard.   No friction rub.  Pulmonary:     Effort: No respiratory distress.     Breath sounds: No stridor. No wheezing or rhonchi.  Musculoskeletal:     Cervical back: No rigidity or tenderness.  Neurological:     Mental Status: He is alert.  Psychiatric:        Mood and Affect: Mood normal.    Data Reviewed: Pulmonary function test from 03/08/2015 reviewed showing normal PFT  Last CT scan of the chest from 04/11/2021 reviewed by myself -Emphysema emphysematous changes, postsurgical changes left lower lobe  Last echocardiogram 08/30/2016 -Normal ejection fraction, diastolic dysfunction  Assessment:  Shortness of breath with activity  Underlying chronic obstructive pulmonary disease Emphysema  Past history of lung cancer s/p resection  Morbid obesity  Deconditioning  Chronic back pain  Shortness of breath is multifactorial  Plan/Recommendations: Obtain pulmonary function test  Schedule for echocardiogram to assess cardiac function, may be contributing to shortness of breath  Graded exercise as tolerated  Prescription for Trelegy, sample provided  Prescription for albuterol to be used as needed  The importance of graded exercises was discussed with the patient Is concerned about his back pain, encouraged to exercise within the limits of what his back will allow but to ensure that he still exercises regularly  I spent 45 minutes dedicated to the care of this patient on the date of this encounter to include previsit review of records, face-to-face time with the patient discussing  conditions above, post visit ordering of testing, clinical documentation with electronic health record and communicated necessary findings to members of the patient's care team   Sherrilyn Rist MD [facility] 04/19/2021, 2:42 PM  CC: [name], NP

## 2021-05-03 ENCOUNTER — Telehealth: Payer: Self-pay | Admitting: Pulmonary Disease

## 2021-05-03 MED ORDER — TRELEGY ELLIPTA 100-62.5-25 MCG/ACT IN AEPB: 1.0000 | INHALATION_SPRAY | Freq: Every day | RESPIRATORY_TRACT | 6 refills | Status: DC

## 2021-05-03 MED ORDER — ALBUTEROL SULFATE HFA 108 (90 BASE) MCG/ACT IN AERS: 2.0000 | INHALATION_SPRAY | Freq: Four times a day (QID) | RESPIRATORY_TRACT | 2 refills | Status: DC | PRN

## 2021-05-03 NOTE — Telephone Encounter (Signed)
Called and spoke with pt and he is aware of the albuterol that has been sent to the pharmacy.   Nothing further is needed.

## 2021-05-16 ENCOUNTER — Encounter: Payer: Self-pay | Admitting: Gastroenterology

## 2021-05-18 ENCOUNTER — Ambulatory Visit (HOSPITAL_COMMUNITY)
Admission: RE | Admit: 2021-05-18 | Discharge: 2021-05-18 | Disposition: A | Discharge status: Home / Self Care | Payer: PPO | Source: Ambulatory Visit | Attending: Pulmonary Disease | Admitting: Pulmonary Disease

## 2021-05-18 ENCOUNTER — Other Ambulatory Visit: Payer: Self-pay

## 2021-05-18 DIAGNOSIS — I358 Other nonrheumatic aortic valve disorders: Secondary | ICD-10-CM | POA: Diagnosis not present

## 2021-05-18 DIAGNOSIS — I4891 Unspecified atrial fibrillation: Secondary | ICD-10-CM | POA: Diagnosis not present

## 2021-05-18 DIAGNOSIS — I340 Nonrheumatic mitral (valve) insufficiency: Secondary | ICD-10-CM | POA: Insufficient documentation

## 2021-05-18 DIAGNOSIS — R0600 Dyspnea, unspecified: Secondary | ICD-10-CM | POA: Diagnosis not present

## 2021-05-18 DIAGNOSIS — R9431 Abnormal electrocardiogram [ECG] [EKG]: Secondary | ICD-10-CM | POA: Diagnosis not present

## 2021-05-18 DIAGNOSIS — I10 Essential (primary) hypertension: Secondary | ICD-10-CM | POA: Insufficient documentation

## 2021-05-18 DIAGNOSIS — R0609 Other forms of dyspnea: Secondary | ICD-10-CM | POA: Diagnosis not present

## 2021-05-18 DIAGNOSIS — I3481 Nonrheumatic mitral (valve) annulus calcification: Secondary | ICD-10-CM | POA: Diagnosis not present

## 2021-05-18 DIAGNOSIS — Z87891 Personal history of nicotine dependence: Secondary | ICD-10-CM | POA: Insufficient documentation

## 2021-05-18 LAB — ECHOCARDIOGRAM COMPLETE
Area-P 1/2: 3.72 cm2
Calc EF: 65.8 %
MV VTI: 1.77 cm2
S' Lateral: 3 cm
Single Plane A2C EF: 61.9 %
Single Plane A4C EF: 66.1 %

## 2021-05-18 NOTE — Progress Notes (Signed)
  Echocardiogram 2D Echocardiogram has been performed.  [name] 05/18/2021, 9:01 AM

## 2021-05-24 ENCOUNTER — Other Ambulatory Visit: Payer: Self-pay | Admitting: Nurse Practitioner

## 2021-05-24 DIAGNOSIS — I10 Essential (primary) hypertension: Secondary | ICD-10-CM

## 2021-06-06 ENCOUNTER — Encounter: Payer: Self-pay | Admitting: Pulmonary Disease

## 2021-06-06 ENCOUNTER — Ambulatory Visit (INDEPENDENT_AMBULATORY_CARE_PROVIDER_SITE_OTHER): Payer: PPO | Admitting: Pulmonary Disease

## 2021-06-06 ENCOUNTER — Other Ambulatory Visit: Payer: Self-pay

## 2021-06-06 ENCOUNTER — Ambulatory Visit: Payer: PPO | Admitting: Pulmonary Disease

## 2021-06-06 VITALS — BP 128/60 | HR 64 | Temp 97.9°F | Ht 65.75 in | Wt 241.8 lb

## 2021-06-06 DIAGNOSIS — R0609 Other forms of dyspnea: Secondary | ICD-10-CM

## 2021-06-06 LAB — PULMONARY FUNCTION TEST
DL/VA % pred: 77 %
DL/VA: 3.1 ml/min/mmHg/L
DLCO cor % pred: 66 %
DLCO cor: 14.51 ml/min/mmHg
DLCO unc % pred: 66 %
DLCO unc: 14.51 ml/min/mmHg
FEF 25-75 Post: 0.72 L/sec
FEF 25-75 Pre: 0.61 L/sec
FEF2575-%Change-Post: 18 %
FEF2575-%Pred-Post: 40 %
FEF2575-%Pred-Pre: 34 %
FEV1-%Change-Post: 8 %
FEV1-%Pred-Post: 67 %
FEV1-%Pred-Pre: 61 %
FEV1-Post: 1.68 L
FEV1-Pre: 1.54 L
FEV1FVC-%Change-Post: 6 %
FEV1FVC-%Pred-Pre: 82 %
FEV6-%Change-Post: 4 %
FEV6-%Pred-Post: 79 %
FEV6-%Pred-Pre: 76 %
FEV6-Post: 2.59 L
FEV6-Pre: 2.48 L
FEV6FVC-%Change-Post: 1 %
FEV6FVC-%Pred-Post: 104 %
FEV6FVC-%Pred-Pre: 102 %
FVC-%Change-Post: 2 %
FVC-%Pred-Post: 75 %
FVC-%Pred-Pre: 74 %
FVC-Post: 2.66 L
FVC-Pre: 2.6 L
Post FEV1/FVC ratio: 63 %
Post FEV6/FVC ratio: 97 %
Pre FEV1/FVC ratio: 59 %
Pre FEV6/FVC Ratio: 96 %
RV % pred: 122 %
RV: 2.92 L
TLC % pred: 91 %
TLC: 5.71 L

## 2021-06-06 MED ORDER — TRELEGY ELLIPTA 100-62.5-25 MCG/ACT IN AEPB: 1.0000 | INHALATION_SPRAY | Freq: Every day | RESPIRATORY_TRACT | 6 refills | Status: DC

## 2021-06-06 NOTE — Progress Notes (Signed)
[name]    [id number]    May 06, 1941  Primary Care Physician:Eubanks, Carlos American, NP  Referring Physician: [name], NP [place],  [place] [address]  Chief complaint:    Follow-up for shortness of breath HPI:  History of significant shortness of breath with exertion  History of obstructive lung disease  Started on Trelegy the last time he was in the office Has not been using the Trelegy following finishing the sample he was given did not fill the medication He stated he did not know he was supposed to be using it  He is more limited by significant back pain and discomfort, status post 3 back surgeries, shortness of breath with moderate exertion  He recently quit smoking about end of August 2022, smoked over a pack a day for many years-over 55-pack-year smoking history History of lung cancer for which he had his left lower lobe removed in 2016  History of hypertension, atrial fibrillation, hypercholesterolemia Multiple back surgeries as stated above  Not currently on any inhalers  Worked for a tobacco company, also did plumbing in the past   No pets   Outpatient Encounter Medications as of 06/06/2021  Medication Sig   albuterol (VENTOLIN HFA) 108 (90 Base) MCG/ACT inhaler Inhale 2 puffs into the lungs every 6 (six) hours as needed for wheezing or shortness of breath.   atorvastatin (LIPITOR) 10 MG tablet Take 1 tablet (10 mg total) by mouth daily.   hydrALAZINE (APRESOLINE) 25 MG tablet TAKE 1 TABLET BY MOUTH THREE TIMES DAILY FOR BLOOD PRESSURE   losartan (COZAAR) 50 MG tablet TAKE 1 TABLET BY MOUTH EVERY DAY FOR BLOOD PRESSURE   metoprolol tartrate (LOPRESSOR) 25 MG tablet TAKE 1/2 TABLET BY MOUTH TWICE DAILY   Fluticasone-Umeclidin-Vilant (TRELEGY ELLIPTA) 100-62.5-25 MCG/ACT AEPB Inhale 1 puff into the lungs daily.   [DISCONTINUED] Fluticasone-Umeclidin-Vilant (TRELEGY ELLIPTA) 100-62.5-25 MCG/ACT AEPB Inhale 1 puff into the  lungs daily. (Patient not taking: Reported on 06/06/2021)   [DISCONTINUED] Fluticasone-Umeclidin-Vilant (TRELEGY ELLIPTA) 100-62.5-25 MCG/INH AEPB Inhale 1 puff into the lungs daily. (Patient not taking: Reported on 06/06/2021)   No facility-administered encounter medications on file as of 06/06/2021.    Allergies as of 06/06/2021 - Review Complete 06/06/2021  Allergen Reaction Noted   Asa [aspirin] Anaphylaxis and Swelling 12/01/2014   Naproxen Hives 06/03/2015   Nsaids Hives 06/03/2015   Tolmetin Hives 06/03/2015    Past Medical History:  Diagnosis Date   A-fib [facility])    Acute appendicitis with peritoneal abscess 01/28/2013   Allergy    seasonal    Anxiety    Arthritis    Bacteremia September 2015   Elevated PSA    Essential hypertension 04/16/2014   Heart murmur    childhood   Ileus, postoperative ([place]) 01/28/2013   Lung cancer ([place]) 2016   Obesity, morbid ([place]) 01/28/2013   Shortness of breath    Tobacco use disorder 01/28/2013    Past Surgical History:  Procedure Laterality Date   APPENDECTOMY     BACK SURGERY  1997, 2003   Dr.Apleton    CARPAL TUNNEL RELEASE     COLONOSCOPY     x 3   EYE SURGERY Left    muscle of the eye    KNEE SURGERY Bilateral    LAPAROSCOPIC APPENDECTOMY N/A 01/25/2013   Procedure: APPENDECTOMY LAPAROSCOPIC;  Surgeon: Ralene Ok, MD;  Location: [place];  Service: General;  Laterality: N/A;   LYMPH NODE  DISSECTION Left 04/26/2015   Procedure: LYMPH NODE DISSECTION;  Surgeon: Grace Isaac, MD;  Location: [place];  Service: Thoracic;  Laterality: Left;   POLYPECTOMY     SHOULDER SURGERY Right 2014   Dr.Whitfield rotator cuff surgery   SKIN SURGERY Right 01/2019   Right Leg, cancerous area removed   TEE WITHOUT CARDIOVERSION N/A 04/07/2014   Procedure: TRANSESOPHAGEAL ECHOCARDIOGRAM (TEE);  Surgeon: Candee Furbish, MD;  Location[facility] ENDOSCOPY;  Service: Cardiovascular;  Laterality: N/A;   VIDEO ASSISTED THORACOSCOPY (VATS)/WEDGE RESECTION Left  04/26/2015   Procedure: LEFT VIDEO ASSISTED THORACOSCOPY (VATS) WITH LEFT LOWER LOBECTOMY;  Surgeon: Grace Isaac, MD;  Location: [place];  Service: Thoracic;  Laterality: Left;   VIDEO BRONCHOSCOPY N/A 04/26/2015   Procedure: VIDEO BRONCHOSCOPY;  Surgeon: Grace Isaac, MD;  Location: [place];  Service: Thoracic;  Laterality: N/A;    Family History  Problem Relation Age of Onset   Irritable bowel syndrome Mother    Cancer Neg Hx    Lung disease Neg Hx    Colon cancer Neg Hx    Colon polyps Neg Hx    Esophageal cancer Neg Hx    Rectal cancer Neg Hx    Stomach cancer Neg Hx     Social History   Socioeconomic History   Marital status: Divorced    Spouse name: Not on file   Number of children: 0   Years of education: Not on file   Highest education level: Not on file  Occupational History   Occupation: Retired  Tobacco Use   Smoking status: Former    Packs/day: 0.50    Years: 60.00    Pack years: 30.00    Types: Cigarettes, Cigars    Start date: 07/16/1958    Quit date: 03/03/2021    Years since quitting: 0.2   Smokeless tobacco: Former    Types: Chew    Quit date: 07/17/2003   Tobacco comments:    Quit smoking cigarettes about age 86   Vaping Use   Vaping Use: Former   Quit date: 07/16/2008  Substance and Sexual Activity   Alcohol use: No    Alcohol/week: 0.0 standard drinks   Drug use: No   Sexual activity: Not Currently  Other Topics Concern   Not on file  Social History Narrative   No diet   Yes, eats/drinks things with caffeine    Divorced, married 1979   Lives in a house, one stories , one person, no pets   Current/past profession- [employer]   Patient exercises, golf 3 days weekly        Originally from [place]. Previously lived in [place] [facility] [facility] [place] for [place] years and moved back to [place] in 1968. No international travel. He has traveled through multiple states traveling to [place]. 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 Unsure if he was exposed to any asbestos. No mold exposure. No pets currently. Previously has owned dogs. Parakeet as a child. No hot tub exposure. Enjoys golfing.   Social Determinants of Health   Financial Resource Strain: Not on file  Food Insecurity: Not on file  Transportation Needs: Not on file  Physical Activity: Not on file  Stress: Not on file  Social Connections: Not on file  Intimate Partner Violence: Not on file    Review of Systems  Respiratory:  Positive for shortness of breath.   Musculoskeletal:  Positive for back pain.  Psychiatric/Behavioral:  Negative  for sleep disturbance.    Vitals:   06/06/21 1055  BP: 128/60  Pulse: 64  Temp: 97.9 F (36.6 C)  SpO2: 96%     Physical Exam Constitutional:      Appearance: He is obese.  HENT:     Head: Normocephalic.     Right Ear: Tympanic membrane normal.     Nose: No congestion.  Eyes:     Pupils: Pupils are equal, round, and reactive to light.  Cardiovascular:     Rate and Rhythm: Normal rate and regular rhythm.     Pulses: Normal pulses.     Heart sounds: No murmur heard.   No friction rub.  Pulmonary:     Effort: No respiratory distress.     Breath sounds: No stridor. No wheezing or rhonchi.  Musculoskeletal:     Cervical back: No rigidity or tenderness.  Neurological:     Mental Status: He is alert.  Psychiatric:        Mood and Affect: Mood normal.    Data Reviewed: Pulmonary function test from 03/08/2015 reviewed showing normal PFT  Last CT scan of the chest from 04/11/2021 reviewed by myself -Emphysema emphysematous changes, postsurgical changes left lower lobe  Last echocardiogram 08/30/2016 -Normal ejection fraction, diastolic dysfunction  PFT today 06/06/2021 revealed moderate obstructive disease with no significant bronchodilator response  Echocardiogram performed 05/18/2021 significant for mild pulmonary hypertension, this is likely secondary to obstructive lung disease,  previous echo did report diastolic dysfunction  Assessment:  Shortness of breath with activity  Underlying chronic obstructive pulmonary disease Emphysema  Past history of lung cancer s/p resection  Morbid obesity  Deconditioning  Chronic back pain  Shortness of breath is multifactorial  The reason to use Trelegy was discussed again today in the office Albuterol use only as needed  Plan/Recommendations:  Encouraged graded exercise as tolerated  Prescription for Trelegy sent into pharmacy  Importance of weight loss and weight management discussed  Tentative follow-up in 6 months, encouraged to call with any significant concerns  Sherrilyn Rist MD [facility] 06/06/2021, 11:23 AM  CC: [name], NP

## 2021-06-06 NOTE — Patient Instructions (Signed)
Prescription for Trelegy sent into pharmacy for you  Use Trelegy on a daily basis regardless of how you are feeling  Use albuterol only as needed  I will see you in 6 months  You are welcome to call with any significant concerns

## 2021-06-06 NOTE — Progress Notes (Signed)
PFT done today. 

## 2021-06-23 ENCOUNTER — Telehealth: Payer: Self-pay | Admitting: Gastroenterology

## 2021-06-23 NOTE — Telephone Encounter (Signed)
Patient called and states that he got a letter in the mail to have another colonoscopy. Patient states he does not understand why he needs another one because he just had one in April of this year. Requesting a call back. Please advise.

## 2021-06-23 NOTE — Telephone Encounter (Signed)
Dear [name],     The polyps that I removed during your recent procedure were completely benign but were proven to be "pre-cancerous" polyps that MAY have grown into cancers if they had not been removed.  Studies shows that at least 20% of women over age 78 and 30% of men over age 36 have pre-cancerous polyps.  Based on current nationally recognized surveillance guidelines, I recommend that you have a repeat colonoscopy in 6 months.    If you develop any new rectal bleeding, abdominal pain or significant bowel habit changes, please contact me before then.     If you have any questions or concerns, please don't hesitate to call.   Sincerely,       Kurt Banister, MD  [facility]                       Ruben Reason, [phone]                    1      Print Letter Notes  None

## 2021-06-23 NOTE — Telephone Encounter (Signed)
I spoke with the pt and explained the path results and the letter.  We discussed why he needs to have another colon.  He agreed but states he wants to think about it some more and call back to schedule.

## 2021-07-19 ENCOUNTER — Other Ambulatory Visit: Payer: Self-pay

## 2021-07-19 ENCOUNTER — Encounter: Payer: Self-pay | Admitting: Family Medicine

## 2021-07-19 ENCOUNTER — Ambulatory Visit (INDEPENDENT_AMBULATORY_CARE_PROVIDER_SITE_OTHER): Payer: PPO | Admitting: Family Medicine

## 2021-07-19 VITALS — BP 128/60 | HR 68 | Temp 98.0°F | Ht 65.0 in | Wt 250.2 lb

## 2021-07-19 DIAGNOSIS — M7989 Other specified soft tissue disorders: Secondary | ICD-10-CM | POA: Diagnosis not present

## 2021-07-19 MED ORDER — TRIAMTERENE-HCTZ 75-50 MG PO TABS: 1.0000 | ORAL_TABLET | Freq: Every day | ORAL | 3 refills | Status: DC

## 2021-07-19 NOTE — Patient Instructions (Signed)
Take diuretic in AM Elevate legs when seated Consider compression socks

## 2021-07-19 NOTE — Progress Notes (Signed)
Provider:  Alain Honey, MD  Careteam: Patient Care Team: [name], NP as PCP - General (Nurse Practitioner) Druscilla Brownie, MD as Consulting Physician (Dermatology) Milus Banister, MD as Attending Physician (Gastroenterology)  PLACE OF SERVICE:  [place]  Advanced Directive information    Allergies  Allergen Reactions   Asa [Aspirin] Anaphylaxis and Swelling    Angioedema/Eyes and lip swelling   Naproxen Hives   Nsaids Hives   Tolmetin Hives    Chief Complaint  Patient presents with   Acute Visit    Patient presents today for bilateral leg pain/swelling for a couple of months. He reports left leg is worse while walking.      HPI: Patient is a 81 y.o. male .  Patient complains today of bilateral leg swelling, left greater than right.  There is some pain with swelling.  He denies any increase in breathing difficulties.  Review of his past history shows a normal recent echocardiogram with ejection fraction.  There is normal renal function on labs.  He does have COPD and is followed by pulmonology. There is also a history of recurrent edema going back as far as 7 years.  Over various visits he has been treated with both thiazide diuretic as well as Lasix but he is off diuretics at this time. He does report some increase in urinary frequency especially nocturia probably related to a larger prostate. He does not eat salt.  Review of Systems:  Review of Systems  Constitutional:        Significant weight gain since he has stopped smoking  Respiratory: Negative.    Cardiovascular:  Positive for leg swelling.  Genitourinary: Negative.   Musculoskeletal: Negative.   Neurological: Negative.   Psychiatric/Behavioral: Negative.    All other systems reviewed and are negative.  Past Medical History:  Diagnosis Date   A-fib [facility])    Acute appendicitis with peritoneal abscess 01/28/2013   Allergy    seasonal    Anxiety    Arthritis    Bacteremia  September 2015   Elevated PSA    Essential hypertension 04/16/2014   Heart murmur    childhood   Ileus, postoperative ([place]) 01/28/2013   Lung cancer ([place]) 2016   Obesity, morbid ([place]) 01/28/2013   Shortness of breath    Tobacco use disorder 01/28/2013   Past Surgical History:  Procedure Laterality Date   APPENDECTOMY     BACK SURGERY  1997, 2003   Dr.Apleton    CARPAL TUNNEL RELEASE     COLONOSCOPY     x 3   EYE SURGERY Left    muscle of the eye    KNEE SURGERY Bilateral    LAPAROSCOPIC APPENDECTOMY N/A 01/25/2013   Procedure: APPENDECTOMY LAPAROSCOPIC;  Surgeon: Ralene Ok, MD;  Location: [place];  Service: General;  Laterality: N/A;   LYMPH NODE DISSECTION Left 04/26/2015   Procedure: LYMPH NODE DISSECTION;  Surgeon: Grace Isaac, MD;  Location: [place];  Service: Thoracic;  Laterality: Left;   POLYPECTOMY     SHOULDER SURGERY Right 2014   Dr.Whitfield rotator cuff surgery   SKIN SURGERY Right 01/2019   Right Leg, cancerous area removed   TEE WITHOUT CARDIOVERSION N/A 04/07/2014   Procedure: TRANSESOPHAGEAL ECHOCARDIOGRAM (TEE);  Surgeon: Candee Furbish, MD;  Location: [facility] ENDOSCOPY;  Service: Cardiovascular;  Laterality: N/A;   VIDEO ASSISTED THORACOSCOPY (VATS)/WEDGE RESECTION Left 04/26/2015   Procedure: LEFT VIDEO ASSISTED THORACOSCOPY (VATS) WITH LEFT LOWER LOBECTOMY;  Surgeon: Grace Isaac,  MD;  Location: [place];  Service: Thoracic;  Laterality: Left;   VIDEO BRONCHOSCOPY N/A 04/26/2015   Procedure: VIDEO BRONCHOSCOPY;  Surgeon: Grace Isaac, MD;  Location: [place];  Service: Thoracic;  Laterality: N/A;   Social History:   reports that he quit smoking about 4 months ago. His smoking use included cigarettes and cigars. He started smoking about 63 years ago. He has a 30.00 pack-year smoking history. He quit smokeless tobacco use about 18 years ago.  His smokeless tobacco use included chew. He reports that he does not drink alcohol and does not use drugs.  Family  History  Problem Relation Age of Onset   Irritable bowel syndrome Mother    Cancer Neg Hx    Lung disease Neg Hx    Colon cancer Neg Hx    Colon polyps Neg Hx    Esophageal cancer Neg Hx    Rectal cancer Neg Hx    Stomach cancer Neg Hx     Medications: Patient's Medications  New Prescriptions   TRIAMTERENE-HYDROCHLOROTHIAZIDE (MAXZIDE) 75-50 MG TABLET    Take 1 tablet by mouth daily.  Previous Medications   ALBUTEROL (VENTOLIN HFA) 108 (90 BASE) MCG/ACT INHALER    Inhale 2 puffs into the lungs every 6 (six) hours as needed for wheezing or shortness of breath.   ATORVASTATIN (LIPITOR) 10 MG TABLET    Take 1 tablet (10 mg total) by mouth daily.   FLUTICASONE-UMECLIDIN-VILANT (TRELEGY ELLIPTA) 100-62.5-25 MCG/ACT AEPB    Inhale 1 puff into the lungs daily.   HYDRALAZINE (APRESOLINE) 25 MG TABLET    TAKE 1 TABLET BY MOUTH THREE TIMES DAILY FOR BLOOD PRESSURE   LOSARTAN (COZAAR) 50 MG TABLET    TAKE 1 TABLET BY MOUTH EVERY DAY FOR BLOOD PRESSURE   METOPROLOL TARTRATE (LOPRESSOR) 25 MG TABLET    TAKE 1/2 TABLET BY MOUTH TWICE DAILY  Modified Medications   No medications on file  Discontinued Medications   No medications on file    Physical Exam:  Vitals:   07/19/21 0855  BP: 128/60  Pulse: 68  Temp: 98 F (36.7 C)  SpO2: 96%  Weight: 250 lb 3.2 oz (113.5 kg)  Height: 5\' 5"  (1.651 m)   Body mass index is 41.64 kg/m. Wt Readings from Last 3 Encounters:  07/19/21 250 lb 3.2 oz (113.5 kg)  06/06/21 241 lb 12.8 oz (109.7 kg)  04/19/21 230 lb 3.2 oz (104.4 kg)    Physical Exam Vitals and nursing note reviewed.  Constitutional:      Appearance: Normal appearance.  Cardiovascular:     Rate and Rhythm: Normal rate and regular rhythm.     Comments: There is a history of A. fib but he appears to be in sinus rhythm today Pulmonary:     Effort: Pulmonary effort is normal.     Breath sounds: Normal breath sounds.  Musculoskeletal:        General: Swelling present.      Comments: 2+ edema at ankles and lower legs.  There is no inflammation or increased temp as might be present with DVT.  Neurological:     Mental Status: He is alert.    Labs reviewed: Basic Metabolic Panel: Recent Labs    09/01/20 0810 03/02/21 0804 04/03/21 1031  NA 144 141 141  K 5.0 4.5 4.8  CL 107 105 105  CO2 30 27 27   GLUCOSE 109* 100* 120  BUN 21 17 23   CREATININE 0.94 0.95 1.14  CALCIUM 9.6  9.4 9.4   Liver Function Tests: Recent Labs    03/02/21 0804 04/03/21 1031  AST 15 17  ALT 13 16  BILITOT 0.5 0.5  PROT 7.0 6.8   No results for input(s): LIPASE, AMYLASE in the last 8760 hours. No results for input(s): AMMONIA in the last 8760 hours. CBC: Recent Labs    09/01/20 0810 03/02/21 0804  WBC 7.7 6.6  NEUTROABS 5,336 4,514  HGB 17.5* 17.9*  HCT 52.6* 53.0*  MCV 99.1 96.7  PLT 212 231   Lipid Panel: Recent Labs    09/01/20 0810 03/02/21 0804 04/03/21 1031  CHOL 153 163 129  HDL 39* 43 39*  LDLCALC 86 95 59  TRIG 180* 149 246*  CHOLHDL 3.9 3.8 3.3   TSH: No results for input(s): TSH in the last 8760 hours. A1C: Lab Results  Component Value Date   HGBA1C 5.4 03/02/2021     Assessment/Plan  1. Leg swelling This is likely related to several factors.  I suspect with his COPD, he has some degree of right-sided failure with edema.  Biggest reason for edema is probably venous insufficiency.  There are some varicosities on exam.  Plan would be to add thiazide diuretic but has I told him if this is not strong enough may have to add Lasix.  Also recommended elevation of legs when possible and use of compression stockings.   Alain Honey, MD [facility] [phone]

## 2021-08-30 ENCOUNTER — Ambulatory Visit: Payer: PPO | Admitting: Podiatry

## 2021-08-30 ENCOUNTER — Ambulatory Visit (INDEPENDENT_AMBULATORY_CARE_PROVIDER_SITE_OTHER): Payer: PPO

## 2021-08-30 ENCOUNTER — Encounter: Payer: Self-pay | Admitting: Podiatry

## 2021-08-30 ENCOUNTER — Other Ambulatory Visit: Payer: Self-pay

## 2021-08-30 DIAGNOSIS — B351 Tinea unguium: Secondary | ICD-10-CM

## 2021-08-30 DIAGNOSIS — I999 Unspecified disorder of circulatory system: Secondary | ICD-10-CM

## 2021-08-30 DIAGNOSIS — M79674 Pain in right toe(s): Secondary | ICD-10-CM

## 2021-08-30 DIAGNOSIS — G629 Polyneuropathy, unspecified: Secondary | ICD-10-CM

## 2021-08-30 DIAGNOSIS — M79675 Pain in left toe(s): Secondary | ICD-10-CM | POA: Diagnosis not present

## 2021-08-30 NOTE — Progress Notes (Signed)
Subjective:   Patient ID: [name], male   DOB: 81 y.o.   MRN: [id number]   HPI Patient states that he is getting numbing in both of his feet left over right and that he gets tingling and occasional swelling that he is taken fluid pill for.  Also complains of nails that he cannot cut anymore cannot reach their thick and incurvated and gets sore and shoe gear is more difficult.  Patient does not smoke likes to be active   Review of Systems  All other systems reviewed and are negative.      Objective:  Physical Exam Vitals and nursing note reviewed.  Constitutional:      Appearance: He is well-developed.  Pulmonary:     Effort: Pulmonary effort is normal.  Musculoskeletal:        General: Normal range of motion.  Skin:    General: Skin is warm.  Neurological:     Mental Status: He is alert.    Neurovascular status was found to be diminished bilateral with diminished cap fill time noted and no ulcerations no breakdown of tissue.  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  Patient will be seen back every 3 months for nail care and is encouraged to call with questions concerns between that may arise

## 2021-09-01 ENCOUNTER — Ambulatory Visit: Payer: PPO | Admitting: Physician Assistant

## 2021-09-01 ENCOUNTER — Encounter: Payer: Self-pay | Admitting: Physician Assistant

## 2021-09-01 VITALS — BP 124/72 | HR 67 | Ht 65.0 in | Wt 247.4 lb

## 2021-09-01 DIAGNOSIS — Z86010 Personal history of colonic polyps: Secondary | ICD-10-CM | POA: Diagnosis not present

## 2021-09-01 MED ORDER — NA SULFATE-K SULFATE-MG SULF 17.5-3.13-1.6 GM/177ML PO SOLN: 1.0000 | Freq: Once | ORAL | 0 refills | Status: AC

## 2021-09-01 NOTE — Progress Notes (Signed)
Chief Complaint: History of adenomatous polyps  HPI:    Mr. [name] is a 81 year old male with a past medical history of A-fib (05/18/2021 echo with LVEF 60-65%) and others listed below, known to Dr. Ardis Hughs, who presents clinic today to discuss a surveillance colonoscopy.    11/07/2020 colonoscopy with two 4-7 mm polyps removed from the transverse colon and cecum, one 25 mm polyp in the proximal ascending colon removed piecemeal and otherwise normal.  Pathology showed sessile serrated polyp and was recommended he have repeat in 6 months.    Today, the patient presents to clinic and tells me he is not really sure why he needs to have another colonoscopy because he just had 1 a little over 6 months ago.  Tells me he has no changes in his GI system and has no real complaints today.    Denies fever, chills, weight loss, blood in his stool or change in bowel habits, nausea, vomiting, heartburn or reflux.  Past Medical History:  Diagnosis Date   A-fib [facility])    Acute appendicitis with peritoneal abscess 01/28/2013   Allergy    seasonal    Anxiety    Arthritis    Bacteremia September 2015   Elevated PSA    Essential hypertension 04/16/2014   Heart murmur    childhood   Ileus, postoperative ([place]) 01/28/2013   Lung cancer ([place]) 2016   Obesity, morbid ([place]) 01/28/2013   Shortness of breath    Tobacco use disorder 01/28/2013    Past Surgical History:  Procedure Laterality Date   APPENDECTOMY     BACK SURGERY  1997, 2003   Dr.Apleton    CARPAL TUNNEL RELEASE     COLONOSCOPY     x 3   EYE SURGERY Left    muscle of the eye    KNEE SURGERY Bilateral    LAPAROSCOPIC APPENDECTOMY N/A 01/25/2013   Procedure: APPENDECTOMY LAPAROSCOPIC;  Surgeon: Ralene Ok, MD;  Location: [place];  Service: General;  Laterality: N/A;   LYMPH NODE DISSECTION Left 04/26/2015   Procedure: LYMPH NODE DISSECTION;  Surgeon: Grace Isaac, MD;  Location: [place];  Service: Thoracic;  Laterality: Left;    POLYPECTOMY     SHOULDER SURGERY Right 2014   Dr.Whitfield rotator cuff surgery   SKIN SURGERY Right 01/2019   Right Leg, cancerous area removed   TEE WITHOUT CARDIOVERSION N/A 04/07/2014   Procedure: TRANSESOPHAGEAL ECHOCARDIOGRAM (TEE);  Surgeon: Candee Furbish, MD;  Location: [place];  Service: Cardiovascular;  Laterality: N/A;   VIDEO ASSISTED THORACOSCOPY (VATS)/WEDGE RESECTION Left 04/26/2015   Procedure: LEFT VIDEO ASSISTED THORACOSCOPY (VATS) WITH LEFT LOWER LOBECTOMY;  Surgeon: Grace Isaac, MD;  Location: [place];  Service: Thoracic;  Laterality: Left;   VIDEO BRONCHOSCOPY N/A 04/26/2015   Procedure: VIDEO BRONCHOSCOPY;  Surgeon: Grace Isaac, MD;  [facility];  Service: Thoracic;  Laterality: N/A;    Current Outpatient Medications  Medication Sig Dispense Refill   albuterol (VENTOLIN HFA) 108 (90 Base) MCG/ACT inhaler Inhale 2 puffs into the lungs every 6 (six) hours as needed for wheezing or shortness of breath. 8 g 2   atorvastatin (LIPITOR) 10 MG tablet Take 1 tablet (10 mg total) by mouth daily. 90 tablet 3   Fluticasone-Umeclidin-Vilant (TRELEGY ELLIPTA) 100-62.5-25 MCG/ACT AEPB Inhale 1 puff into the lungs daily. 60 each 6   hydrALAZINE (APRESOLINE) 25 MG tablet TAKE 1 TABLET BY MOUTH THREE TIMES DAILY FOR BLOOD PRESSURE 270 tablet 1   losartan (COZAAR)  50 MG tablet TAKE 1 TABLET BY MOUTH EVERY DAY FOR BLOOD PRESSURE 90 tablet 3   metoprolol tartrate (LOPRESSOR) 25 MG tablet TAKE 1/2 TABLET BY MOUTH TWICE DAILY 90 tablet 1   triamterene-hydrochlorothiazide (MAXZIDE) 75-50 MG tablet Take 1 tablet by mouth daily. 90 tablet 3   No current facility-administered medications for this visit.    Allergies as of 09/01/2021 - Review Complete 09/01/2021  Allergen Reaction Noted   Asa [aspirin] Anaphylaxis and Swelling 12/01/2014   Naproxen Hives 06/03/2015   Nsaids Hives 06/03/2015   Tolmetin Hives 06/03/2015    Family History  Problem Relation Age of Onset    Irritable bowel syndrome Mother    Cancer Neg Hx    Lung disease Neg Hx    Colon cancer Neg Hx    Colon polyps Neg Hx    Esophageal cancer Neg Hx    Rectal cancer Neg Hx    Stomach cancer Neg Hx     Social History   Socioeconomic History   Marital status: Divorced    Spouse name: Not on file   Number of children: 0   Years of education: Not on file   Highest education level: Not on file  Occupational History   Occupation: Retired  Tobacco Use   Smoking status: Former    Packs/day: 0.50    Years: 60.00    Pack years: 30.00    Types: Cigarettes, Cigars    Start date: 07/16/1958    Quit date: 03/03/2021    Years since quitting: 0.4   Smokeless tobacco: Former    Types: Chew    Quit date: 07/17/2003   Tobacco comments:    Quit smoking cigarettes about age 64   Vaping Use   Vaping Use: Former   Quit date: 07/16/2008  Substance and Sexual Activity   Alcohol use: No    Alcohol/week: 0.0 standard drinks   Drug use: No   Sexual activity: Not Currently  Other Topics Concern   Not on file  Social History Narrative   No diet   Yes, eats/drinks things with caffeine    Divorced, married 1979   Lives in a house, one stories , one person, no pets   Current/past profession- [employer]   Patient exercises, golf 3 days weekly        Originally from [place]. Previously lived in [facility] for [place] years and moved back to [place] in 1968. No international travel. He has traveled through multiple states traveling to [place]. 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 Unsure if he was exposed to any asbestos. No mold exposure. No pets currently. Previously has owned dogs. Parakeet as a child. No hot tub exposure. Enjoys golfing.   Social Determinants of Health   Financial Resource Strain: Not on file  Food Insecurity: Not on file  Transportation Needs: Not on file  Physical Activity: Not on file  Stress: Not on file  Social  Connections: Not on file  Intimate Partner Violence: Not on file    Review of Systems:    Constitutional: No weight loss, fever or chills Cardiovascular: No chest pain Respiratory: No SOB  Gastrointestinal: See HPI and otherwise negative   Physical Exam:  Vital signs: BP 124/72    Pulse 67    Ht 5\' 5"  (1.651 m)    Wt 247 lb 6.4 oz (112.2 kg)    SpO2 96%    BMI 41.17 kg/m  Constitutional:   Pleasant obese Caucasian male appears to be in NAD, Well developed, Well nourished, alert and cooperative Respiratory: Respirations even and unlabored. Lungs clear to auscultation bilaterally.   No wheezes, crackles, or rhonchi.  Cardiovascular: Normal S1, S2. No MRG. Regular rate and rhythm. No peripheral edema, cyanosis or pallor.  Gastrointestinal:  Soft, nondistended, nontender. No rebound or guarding. Normal bowel sounds. No appreciable masses or hepatomegaly. Psychiatric: Demonstrates good judgement and reason without abnormal affect or behaviors.  No recent labs or imaging.  Assessment: 1.  History of adenomatous polyps: Removal of a 25 mm polyp on his colonoscopy in April 22 with recommendations to repeat in 6 months  Plan: 1.  Scheduled patient for a repeat colonoscopy given his history of adenomatous polyps with Dr. Ardis Hughs.  Did provide the patient a detailed list of risks for the procedure and he agrees to proceed. Patient is appropriate for endoscopic procedure(s) in the ambulatory ([place]) setting.  2.  Discussed exactly why patient needs a repeat colonoscopy at this time given his large sessile serrated polyp which was removed piecemeal at last colonoscopy.  He verbalized understanding.  Answered all of his questions. 3.  Patient to follow in clinic per recommendations from Dr. Ardis Hughs after time of procedure.  Ellouise Newer, PA-C [facility] 09/01/2021, 10:01 AM  Cc: [name], NP

## 2021-09-01 NOTE — Patient Instructions (Signed)
You have been scheduled for a colonoscopy. Please follow written instructions given to you at your visit today.  Please pick up your prep supplies at the pharmacy within the next 1-3 days. If you use inhalers (even only as needed), please bring them with you on the day of your procedure.  If you are age 79 or older, your body mass index should be between 23-30. Your Body mass index is 41.17 kg/m. If this is out of the aforementioned range listed, please consider follow up with your Primary Care Provider.  If you are age 1 or younger, your body mass index should be between 19-25. Your Body mass index is 41.17 kg/m. If this is out of the aformentioned range listed, please consider follow up with your Primary Care Provider.   ________________________________________________________  The [place] [facility] providers would like to encourage you to use [facility] to communicate with providers for non-urgent requests or questions.  Due to long hold times on the telephone, sending your provider a message by [facility] may be a faster and more efficient way to get a response.  Please allow 48 business hours for a response.  Please remember that this is for non-urgent requests.  _______________________________________________________

## 2021-09-04 NOTE — Progress Notes (Signed)
I agree with the above note, plan 

## 2021-09-18 DIAGNOSIS — H0102B Squamous blepharitis left eye, upper and lower eyelids: Secondary | ICD-10-CM | POA: Diagnosis not present

## 2021-09-18 DIAGNOSIS — H0102A Squamous blepharitis right eye, upper and lower eyelids: Secondary | ICD-10-CM | POA: Diagnosis not present

## 2021-09-18 DIAGNOSIS — H2513 Age-related nuclear cataract, bilateral: Secondary | ICD-10-CM | POA: Diagnosis not present

## 2021-09-27 ENCOUNTER — Other Ambulatory Visit: Payer: PPO

## 2021-09-27 ENCOUNTER — Other Ambulatory Visit: Payer: Self-pay

## 2021-09-27 DIAGNOSIS — I700 Atherosclerosis of aorta: Secondary | ICD-10-CM

## 2021-09-27 DIAGNOSIS — I10 Essential (primary) hypertension: Secondary | ICD-10-CM | POA: Diagnosis not present

## 2021-09-27 DIAGNOSIS — E782 Mixed hyperlipidemia: Secondary | ICD-10-CM | POA: Diagnosis not present

## 2021-09-27 DIAGNOSIS — R739 Hyperglycemia, unspecified: Secondary | ICD-10-CM

## 2021-09-27 DIAGNOSIS — D582 Other hemoglobinopathies: Secondary | ICD-10-CM

## 2021-09-27 DIAGNOSIS — R972 Elevated prostate specific antigen [PSA]: Secondary | ICD-10-CM

## 2021-09-28 LAB — CBC WITH DIFFERENTIAL/PLATELET
Absolute Monocytes: 585 cells/uL (ref 200–950)
Basophils Absolute: 31 cells/uL (ref 0–200)
Basophils Relative: 0.4 %
Eosinophils Absolute: 109 cells/uL (ref 15–500)
Eosinophils Relative: 1.4 %
HCT: 49.3 % (ref 38.5–50.0)
Hemoglobin: 16.5 g/dL (ref 13.2–17.1)
Lymphs Abs: 1318 cells/uL (ref 850–3900)
MCH: 31.9 pg (ref 27.0–33.0)
MCHC: 33.5 g/dL (ref 32.0–36.0)
MCV: 95.4 fL (ref 80.0–100.0)
MPV: 11.5 fL (ref 7.5–12.5)
Monocytes Relative: 7.5 %
Neutro Abs: 5756 cells/uL (ref 1500–7800)
Neutrophils Relative %: 73.8 %
Platelets: 247 10*3/uL (ref 140–400)
RBC: 5.17 10*6/uL (ref 4.20–5.80)
RDW: 12.5 % (ref 11.0–15.0)
Total Lymphocyte: 16.9 %
WBC: 7.8 10*3/uL (ref 3.8–10.8)

## 2021-09-28 LAB — COMPLETE METABOLIC PANEL WITH GFR
AG Ratio: 1.5 (calc) (ref 1.0–2.5)
ALT: 14 U/L (ref 9–46)
AST: 16 U/L (ref 10–35)
Albumin: 4.3 g/dL (ref 3.6–5.1)
Alkaline phosphatase (APISO): 79 U/L (ref 35–144)
BUN: 21 mg/dL (ref 7–25)
CO2: 27 mmol/L (ref 20–32)
Calcium: 9.4 mg/dL (ref 8.6–10.3)
Chloride: 105 mmol/L (ref 98–110)
Creat: 1.01 mg/dL (ref 0.70–1.28)
Globulin: 2.8 g/dL (calc) (ref 1.9–3.7)
Glucose, Bld: 88 mg/dL (ref 65–99)
Potassium: 4.4 mmol/L (ref 3.5–5.3)
Sodium: 141 mmol/L (ref 135–146)
Total Bilirubin: 0.5 mg/dL (ref 0.2–1.2)
Total Protein: 7.1 g/dL (ref 6.1–8.1)
eGFR: 76 mL/min/{1.73_m2} (ref >=60)

## 2021-09-28 LAB — HEMOGLOBIN A1C
Hgb A1c MFr Bld: 5.7 % of total Hgb — ABNORMAL HIGH (ref <5.7)
Mean Plasma Glucose: 117 mg/dL
eAG (mmol/L): 6.5 mmol/L

## 2021-09-28 LAB — LIPID PANEL
Cholesterol: 114 mg/dL (ref <200)
HDL: 38 mg/dL — ABNORMAL LOW (ref >=40)
LDL Cholesterol (Calc): 53 mg/dL (calc)
Non-HDL Cholesterol (Calc): 76 mg/dL (calc) (ref <130)
Total CHOL/HDL Ratio: 3 (calc) (ref <5.0)
Triglycerides: 149 mg/dL (ref <150)

## 2021-09-28 LAB — PSA: PSA: 2.62 ng/mL (ref <=4.00)

## 2021-10-02 ENCOUNTER — Ambulatory Visit (INDEPENDENT_AMBULATORY_CARE_PROVIDER_SITE_OTHER): Payer: PPO | Admitting: Nurse Practitioner

## 2021-10-02 ENCOUNTER — Other Ambulatory Visit: Payer: Self-pay

## 2021-10-02 ENCOUNTER — Encounter: Payer: Self-pay | Admitting: Nurse Practitioner

## 2021-10-02 DIAGNOSIS — R739 Hyperglycemia, unspecified: Secondary | ICD-10-CM | POA: Diagnosis not present

## 2021-10-02 DIAGNOSIS — I700 Atherosclerosis of aorta: Secondary | ICD-10-CM

## 2021-10-02 DIAGNOSIS — E782 Mixed hyperlipidemia: Secondary | ICD-10-CM

## 2021-10-02 DIAGNOSIS — I10 Essential (primary) hypertension: Secondary | ICD-10-CM

## 2021-10-02 DIAGNOSIS — D582 Other hemoglobinopathies: Secondary | ICD-10-CM

## 2021-10-02 DIAGNOSIS — R972 Elevated prostate specific antigen [PSA]: Secondary | ICD-10-CM | POA: Diagnosis not present

## 2021-10-02 NOTE — Progress Notes (Signed)
Careteam: Patient Care Team: Sharon Seller, NP as PCP - General (Nurse Practitioner) Cherlyn Roberts, MD as Consulting Physician (Dermatology) Rachael Fee, MD as Attending Physician (Gastroenterology)  PLACE OF SERVICE:  [facility]  Advanced Directive information Does Patient Have a Medical Advance Directive?: Yes, Type of Advance Directive: Out of facility DNR (pink MOST or yellow form), Pre-existing out of facility DNR order (yellow form or pink MOST form): Pink MOST form placed in chart (order not valid for inpatient use);Yellow form placed in chart (order not valid for inpatient use), Does patient want to make changes to medical advance directive?: No - Patient declined  Allergies  Allergen Reactions   Asa [Aspirin] Anaphylaxis and Swelling    Angioedema/Eyes and lip swelling   Naproxen Hives   Nsaids Hives   Tolmetin Hives    Chief Complaint  Patient presents with   Medical Management of Chronic Issues    6 month follow-up and discuss labs (active on mychart).  Discuss need for Pneumonia vaccine and additional covid boosters or post pone if patient refuses. Refused covid boosters at this time.      HPI: Patient is a 81 y.o. male for follo wup  Quit smoking and gained weight. Started nutrisystem again.lost 12 lbs .  Has colonoscopy schedule for next month.   Got a stationary bike and doing this regularly.   DM- a1c up, working on better diet.   Htn- on triamterene- hctz and losartan with hydralazine   Hyperlipidemia- LDL at goal.   Review of Systems:  Review of Systems  Constitutional:  Negative for chills, fever and weight loss.  HENT:  Negative for tinnitus.   Respiratory:  Negative for cough, sputum production and shortness of breath.   Cardiovascular:  Negative for chest pain, palpitations and leg swelling.  Gastrointestinal:  Negative for abdominal pain, constipation, diarrhea and heartburn.  Genitourinary:  Negative for dysuria, frequency  and urgency.  Musculoskeletal:  Negative for back pain, falls, joint pain and myalgias.  Skin: Negative.   Neurological:  Negative for dizziness and headaches.  Psychiatric/Behavioral:  Negative for depression and memory loss. The patient does not have insomnia.    Past Medical History:  Diagnosis Date   A-fib [facility])    Acute appendicitis with peritoneal abscess 01/28/2013   Allergy    seasonal    Anxiety    Arthritis    Bacteremia September 2015   Elevated PSA    Essential hypertension 04/16/2014   Heart murmur    childhood   Ileus, postoperative (H[facility]) 01/28/2013   Lung cancer (H[facility]) 2016   Obesity, morbid ([facility]) 01/28/2013   Shortness of breath    Tobacco use disorder 01/28/2013   Past Surgical History:  Procedure Laterality Date   APPENDECTOMY     BACK SURGERY  1997, 2003   Dr.Apleton    CARPAL TUNNEL RELEASE     COLONOSCOPY     x 3   EYE SURGERY Left    muscle of the eye    KNEE SURGERY Bilateral    LAPAROSCOPIC APPENDECTOMY N/A 01/25/2013   Procedure: APPENDECTOMY LAPAROSCOPIC;  Surgeon: [name], MD;  [facility];  Service: General;  Laterality: N/A;   LYMPH NODE DISSECTION Left 04/26/2015   Procedure: LYMPH NODE DISSECTION;  Surgeon: Delight Ovens, MD;  [facility];  Service: Thoracic;  Laterality: Left;   POLYPECTOMY     SHOULDER SURGERY Right 2014   Dr.Whitfield rotator cuff surgery   SKIN SURGERY Right 01/2019  Right Leg, cancerous area removed   TEE WITHOUT CARDIOVERSION N/A 04/07/2014   Procedure: TRANSESOPHAGEAL ECHOCARDIOGRAM (TEE);  Surgeon: Donato Schultz, MD;  Location: [facility] END[facility]COPY;  Service: Cardiovascular;  Laterality: N/A;   VIDEO ASSISTED THORACOSCOPY (VATS)/WEDGE RESECTION Left 04/26/2015   Procedure: LEFT VIDEO ASSISTED THORACOSCOPY (VATS) WITH LEFT LOWER LOBECTOMY;  Surgeon: Delight Ovens, MD;  [facility];  Service: Thoracic;  Laterality: Left;   VIDEO BRONCHOSCOPY N/A 04/26/2015   Procedure: VIDEO BRONCHOSCOPY;  Surgeon:  Delight Ovens, MD;  Location: [facility] OR;  Service: Thoracic;  Laterality: N/A;   Social History:   reports that he quit smoking about 7 months ago. His smoking use included cigarettes and cigars. He started smoking about 63 years ago. He has a 30.00 pack-year smoking history. He quit smokeless tobacco use about 18 years ago.  His smokeless tobacco use included chew. He reports that he does not drink alcohol and does not use drugs.  Family History  Problem Relation Age of Onset   Irritable bowel syndrome Mother    Cancer Neg Hx    Lung disease Neg Hx    Colon cancer Neg Hx    Colon polyps Neg Hx    Esophageal cancer Neg Hx    Rectal cancer Neg Hx    Stomach cancer Neg Hx     Medications: Patient's Medications  New Prescriptions   No medications on file  Previous Medications   ALBUTEROL (VENTOLIN HFA) 108 (90 BASE) MCG/ACT INHALER    Inhale 2 puffs into the lungs every 6 (six) hours as needed for wheezing or shortness of breath.   ATORVASTATIN (LIPITOR) 10 MG TABLET    Take 1 tablet (10 mg total) by mouth daily.   FLUTICASONE-UMECLIDIN-VILANT (TRELEGY ELLIPTA) 100-62.5-25 MCG/ACT AEPB    Inhale 1 puff into the lungs daily.   HYDRALAZINE (APRESOLINE) 25 MG TABLET    TAKE 1 TABLET BY MOUTH THREE TIMES DAILY FOR BLOOD PRESSURE   LOSARTAN (COZAAR) 50 MG TABLET    TAKE 1 TABLET BY MOUTH EVERY DAY FOR BLOOD PRESSURE   METOPROLOL TARTRATE (LOPRESSOR) 25 MG TABLET    TAKE 1/2 TABLET BY MOUTH TWICE DAILY   TRIAMTERENE-HYDROCHLOROTHIAZIDE (MAXZIDE) 75-50 MG TABLET    Take 1 tablet by mouth daily.  Modified Medications   No medications on file  Discontinued Medications   No medications on file    Physical Exam:  Vitals:   10/02/21 0957 10/02/21 1024  BP: 140/78 122/68  Pulse: 88   Temp: 97.7 F (36.5 C)   TempSrc: Temporal   SpO2: 97%   Weight: 236 lb 9.6 oz (107.3 kg)   Height: 5' 5.5" (1.664 m)    Body mass index is 38.77 kg/m. Wt Readings from Last 3 Encounters:  10/02/21 236  lb 9.6 oz (107.3 kg)  09/01/21 247 lb 6.4 oz (112.2 kg)  07/19/21 250 lb 3.2 oz (113.5 kg)    Physical Exam Constitutional:      General: He is not in acute distress.    Appearance: He is well-developed. He is obese. He is not diaphoretic.  HENT:     Head: Normocephalic and atraumatic.     Right Ear: External ear normal.     Left Ear: External ear normal.     Mouth/Throat:     Pharynx: No oropharyngeal exudate.  Eyes:     Conjunctiva/sclera: Conjunctivae normal.     Pupils: Pupils are equal, round, and reactive to light.  Cardiovascular:     Rate and  Rhythm: Normal rate and regular rhythm.     Heart sounds: Normal heart sounds.  Pulmonary:     Effort: Pulmonary effort is normal.     Breath sounds: Normal breath sounds.  Abdominal:     General: Bowel sounds are normal.     Palpations: Abdomen is soft.  Musculoskeletal:        General: No tenderness.     Cervical back: Normal range of motion and neck supple.     Right lower leg: No edema.     Left lower leg: No edema.  Skin:    General: Skin is warm and dry.  Neurological:     Mental Status: He is alert and oriented to person, place, and time. Mental status is at baseline.  Psychiatric:        Mood and Affect: Mood normal.    Labs reviewed: Basic Metabolic Panel: Recent Labs    03/02/21 0804 04/03/21 1031 09/27/21 0803  NA 141 141 141  K 4.5 4.8 4.4  CL 105 105 105  CO2 27 27 27   GLUCOSE 100* 120 88  BUN 17 23 21   CREATININE 0.95 1.14 1.01  CALCIUM 9.4 9.4 9.4   Liver Function Tests: Recent Labs    03/02/21 0804 04/03/21 1031 09/27/21 0803  AST 15 17 16   ALT 13 16 14   BILITOT 0.5 0.5 0.5  PROT 7.0 6.8 7.1   No results for input(s): LIPASE, AMYLASE in the last 8760 hours. No results for input(s): AMMONIA in the last 8760 hours. CBC: Recent Labs    03/02/21 0804 09/27/21 0803  WBC 6.6 7.8  NEUTROABS 4,514 5,756  HGB 17.9* 16.5  HCT 53.0* 49.3  MCV 96.7 95.4  PLT 231 247   Lipid  Panel: Recent Labs    03/02/21 0804 04/03/21 1031 09/27/21 0803  CHOL 163 129 114  HDL 43 39* 38*  LDLCALC 95 59 53  TRIG 149 246* 149  CHOLHDL 3.8 3.3 3.0   TSH: No results for input(s): TSH in the last 8760 hours. A1C: Lab Results  Component Value Date   HGBA1C 5.7 (H) 09/27/2021     Assessment/Plan 1. Obesity, morbid (HCC) --education provided on healthy weight loss through increase in physical activity and proper nutrition  2. Essential hypertension -improved on recheck -Blood pressure well controlled Continue current medications Recheck metabolic panel  3. Mixed hyperlipidemia -cholesterol at goal. Continue current regimen. - CMP with eGFR([facility]); Future - Lipid panel; Future  4. Elevated PSA -PSA in normal range at this, will continue to monitor yearly  5. Aortic atherosclerosis (HCC) -allergic to ASA -continues on statin  6. Elevated hemoglobin (HCC) -stable on recent labs,  Has quit smoking which is likely helpin. - CBC with Differential/Platelet; Future  7. Hyperglycemia -continue dietary modifications and increase in physical activity - Hemoglobin A1c; Future    Return in about 6 months (around 04/04/2022) for routine follow up, labs prior . Janene Harvey. Biagio Borg  [facility] [phone]

## 2021-10-09 ENCOUNTER — Ambulatory Visit: Payer: PPO | Admitting: Gastroenterology

## 2021-10-10 ENCOUNTER — Encounter: Payer: Self-pay | Admitting: Gastroenterology

## 2021-10-11 ENCOUNTER — Other Ambulatory Visit: Payer: Self-pay | Admitting: Nurse Practitioner

## 2021-10-11 DIAGNOSIS — I10 Essential (primary) hypertension: Secondary | ICD-10-CM

## 2021-10-16 ENCOUNTER — Encounter: Payer: Self-pay | Admitting: Gastroenterology

## 2021-10-16 ENCOUNTER — Ambulatory Visit (AMBULATORY_SURGERY_CENTER): Payer: PPO | Admitting: Gastroenterology

## 2021-10-16 VITALS — BP 88/55 | HR 68 | Temp 97.8°F | Resp 31 | Ht 65.0 in | Wt 247.0 lb

## 2021-10-16 DIAGNOSIS — Z86010 Personal history of colonic polyps: Secondary | ICD-10-CM

## 2021-10-16 MED ORDER — SODIUM CHLORIDE 0.9 % IV SOLN: 500.0000 mL | Freq: Once | INTRAVENOUS | Status: DC

## 2021-10-16 NOTE — Progress Notes (Signed)
A and O x3. Report to RN. Tolerated MAC anesthesia well. 

## 2021-10-16 NOTE — Patient Instructions (Signed)
YOU HAD AN ENDOSCOPIC PROCEDURE TODAY AT THE [facility]:   Refer to the procedure report that was given to you for any specific questions about what was found during the examination.  If the procedure report does not answer your questions, please call your gastroenterologist to clarify.  If you requested that your care partner not be given the details of your procedure findings, then the procedure report has been included in a sealed envelope for you to review at your convenience later.  YOU SHOULD EXPECT: Some feelings of bloating in the abdomen. Passage of more gas than usual.  Walking can help get rid of the air that was put into your GI tract during the procedure and reduce the bloating. If you had a lower endoscopy (such as a colonoscopy or flexible sigmoidoscopy) you may notice spotting of blood in your stool or on the toilet paper. If you underwent a bowel prep for your procedure, you may not have a normal bowel movement for a few days.  Please Note:  You might notice some irritation and congestion in your nose or some drainage.  This is from the oxygen used during your procedure.  There is no need for concern and it should clear up in a day or so.  SYMPTOMS TO REPORT IMMEDIATELY:   Following lower endoscopy (colonoscopy or flexible sigmoidoscopy):  Excessive amounts of blood in the stool  Significant tenderness or worsening of abdominal pains  Swelling of the abdomen that is new, acute  Fever of 100F or higher  For urgent or emergent issues, a gastroenterologist can be reached at any hour by calling [phone]. Do not use MyChart messaging for urgent concerns.    DIET:  We do recommend a small meal at first, but then you may proceed to your regular diet.  Drink plenty of fluids but you should avoid alcoholic beverages for 24 hours.  ACTIVITY:  You should plan to take it easy for the rest of today and you should NOT DRIVE or use heavy machinery until tomorrow (because  of the sedation medicines used during the test).    FOLLOW UP: Our staff will call the number listed on your records 48-72 hours following your procedure to check on you and address any questions or concerns that you may have regarding the information given to you following your procedure. If we do not reach you, we will leave a message.  We will attempt to reach you two times.  During this call, we will ask if you have developed any symptoms of COVID 19. If you develop any symptoms (ie: fever, flu-like symptoms, shortness of breath, cough etc.) before then, please call [phone].  If you test positive for Covid 19 in the 2 weeks post procedure, please call and report this information to us.    If any biopsies were taken you will be contacted by phone or by letter within the next 1-3 weeks.  Please call us at [phone] if you have not heard about the biopsies in 3 weeks.    SIGNATURES/CONFIDENTIALITY: You and/or your care partner have signed paperwork which will be entered into your electronic medical record.  These signatures attest to the fact that that the information above on your After Visit Summary has been reviewed and is understood.  Full responsibility of the confidentiality of this discharge information lies with you and/or your care-partner. 

## 2021-10-16 NOTE — Op Note (Addendum)
[place] ?Patient Name: [name] ?Procedure Date: 10/16/2021 10:14 AM ?MRN: [id number] ?Endoscopist: Milus Banister , MD ?Age: 81 ?Referring MD:  ?Date of Birth: 03-18-1941 ?Gender: Male ?Account #: [id number] ?Procedure:                Colonoscopy ?Indications:              High risk colon cancer surveillance: Personal  ?                          history of colonic polyps; ?Colonoscopy 2017 single  ?                          subCM adenoma. 10/2020 colonoscopy with two 4-7 mm  ?                          polyps removed from the transverse colon and cecum,  ?                          one?25 mm polyp in the proximal ascending colon  ?                          removed piecemeal and otherwise normal. ?Pathology  ?                          showed sessile serrated polyp. ?Medicines:                Monitored Anesthesia Care ?Procedure:                Pre-Anesthesia Assessment: ?                          - Prior to the procedure, a History and Physical  ?                          was performed, and patient medications and  ?                          allergies were reviewed. The patient's tolerance of  ?                          previous anesthesia was also reviewed. The risks  ?                          and benefits of the procedure and the sedation  ?                          options and risks were discussed with the patient.  ?                          All questions were answered, and informed consent  ?                          was obtained. Prior Anticoagulants: The patient has  ?  taken no previous anticoagulant or antiplatelet  ?                          agents. ASA Grade Assessment: II - A patient with  ?                          mild systemic disease. After reviewing the risks  ?                          and benefits, the patient was deemed in  ?                          satisfactory condition to undergo the procedure. ?                          After obtaining informed  consent, the colonoscope  ?                          was passed under direct vision. Throughout the  ?                          procedure, the patient's blood pressure, pulse, and  ?                          oxygen saturations were monitored continuously. The  ?                          Olympus CF-[id number] (#[id number]) Colonoscope was  ?                          introduced through the anus and advanced to the the  ?                          cecum, identified by appendiceal orifice and  ?                          ileocecal valve. The colonoscopy was performed  ?                          without difficulty. The patient tolerated the  ?                          procedure well. The quality of the bowel  ?                          preparation was good. The ileocecal valve,  ?                          appendiceal orifice, and rectum were photographed. ?Scope In: 10:18:56 AM ?Scope Out: 10:29:55 AM ?Scope Withdrawal Time: 0 hours 8 minutes 0 seconds  ?Total Procedure Duration: 0 hours 10 minutes 59 seconds  ?Findings:                 The site of 2022 piecemeal polypectomy in the  ?  ascending colon was imperceptible. ?                          The entire examined colon appeared normal on direct  ?                          and retroflexion views. ?Complications:            No immediate complications. Estimated blood loss:  ?                          None. ?Estimated Blood Loss:     Estimated blood loss: none. ?Impression:               - The site of 2022 piecemeal polypectomy in the  ?                          ascending colon was imperceptible. ?                          - The entire examined colon is normal on direct and  ?                          retroflexion views. ?                          - No polyps or cancers. ?Recommendation:           - Patient has a contact number available for  ?                          emergencies. The signs and symptoms of potential  ?                          delayed  complications were discussed with the  ?                          patient. Return to normal activities tomorrow.  ?                          Written discharge instructions were provided to the  ?                          patient. ?                          - Resume previous diet. ?                          - Continue present medications. ?                          - You do not need any further colon cancer  ?                          screening tests (including stool testing). These  ?  types of tests generally stop around age 103-80. ?Milus Banister, MD ?10/16/2021 10:36:00 AM ?This report has been signed electronically. ?

## 2021-10-16 NOTE — Progress Notes (Signed)
Colonoscopy 2017 single subCM adenoma.  10/2020 colonoscopy with two 4-7 mm polyps removed from the transverse colon and cecum, one 25 mm polyp in the proximal ascending colon removed piecemeal and otherwise normal.  Pathology showed sessile serrated polyp. ? ?HPI: ?This is a man with h/o polyps ? ? ?ROS: complete GI ROS as described in HPI, all other review negative. ? ?Constitutional:  No unintentional weight loss ? ? ?Past Medical History:  ?Diagnosis Date  ? A-fib ([place])   ? Acute appendicitis with peritoneal abscess 01/28/2013  ? Allergy   ? seasonal   ? Anxiety   ? Arthritis   ? Bacteremia September 2015  ? Elevated PSA   ? Essential hypertension 04/16/2014  ? Heart murmur   ? childhood  ? Ileus, postoperative ([place]) 01/28/2013  ? Lung cancer ([place]) 2016  ? Obesity, morbid ([place]) 01/28/2013  ? Shortness of breath   ? Tobacco use disorder 01/28/2013  ? ? ?Past Surgical History:  ?Procedure Laterality Date  ? APPENDECTOMY    ? [place], 2003  ? Dr.Apleton   ? CARPAL TUNNEL RELEASE    ? COLONOSCOPY    ? x 3  ? EYE SURGERY Left   ? muscle of the eye   ? KNEE SURGERY Bilateral   ? LAPAROSCOPIC APPENDECTOMY N/A 01/25/2013  ? Procedure: APPENDECTOMY LAPAROSCOPIC;  Surgeon: Ralene Ok, MD;  Location: [facility];  Service: General;  Laterality: N/A;  ? LYMPH NODE DISSECTION Left 04/26/2015  ? Procedure: LYMPH NODE DISSECTION;  Surgeon: Grace Isaac, MD;  Location: [facility];  Service: Thoracic;  Laterality: Left;  ? POLYPECTOMY    ? SHOULDER SURGERY Right 2014  ? Dr.Whitfield rotator cuff surgery  ? SKIN SURGERY Right 01/2019  ? Right Leg, cancerous area removed  ? TEE WITHOUT CARDIOVERSION N/A 04/07/2014  ? Procedure: TRANSESOPHAGEAL ECHOCARDIOGRAM (TEE);  Surgeon: Candee Furbish, MD;  Location: [facility] ENDOSCOPY;  Service: Cardiovascular;  Laterality: N/A;  ? VIDEO ASSISTED THORACOSCOPY (VATS)/WEDGE RESECTION Left 04/26/2015  ? Procedure: LEFT VIDEO ASSISTED THORACOSCOPY (VATS) WITH LEFT LOWER LOBECTOMY;  Surgeon: Grace Isaac, MD;  Location: [facility];  Service: Thoracic;  Laterality: Left;  ? VIDEO BRONCHOSCOPY N/A 04/26/2015  ? Procedure: VIDEO BRONCHOSCOPY;  Surgeon: Grace Isaac, MD;  Location: [facility];  Service: Thoracic;  Laterality: N/A;  ? ? ?Current Outpatient Medications  ?Medication Sig Dispense Refill  ? atorvastatin (LIPITOR) 10 MG tablet Take 1 tablet (10 mg total) by mouth daily. 90 tablet 3  ? Fluticasone-Umeclidin-Vilant (TRELEGY ELLIPTA) 100-62.5-25 MCG/ACT AEPB Inhale 1 puff into the lungs daily. 60 each 6  ? hydrALAZINE (APRESOLINE) 25 MG tablet TAKE 1 TABLET BY MOUTH THREE TIMES DAILY FOR BLOOD PRESSURE 270 tablet 1  ? losartan (COZAAR) 50 MG tablet TAKE 1 TABLET BY MOUTH EVERY DAY FOR BLOOD PRESSURE 90 tablet 3  ? metoprolol tartrate (LOPRESSOR) 25 MG tablet TAKE 1/2 TABLET BY MOUTH TWICE DAILY 90 tablet 1  ? triamterene-hydrochlorothiazide (MAXZIDE) 75-50 MG tablet Take 1 tablet by mouth daily. 90 tablet 3  ? albuterol (VENTOLIN HFA) 108 (90 Base) MCG/ACT inhaler Inhale 2 puffs into the lungs every 6 (six) hours as needed for wheezing or shortness of breath. 8 g 2  ? ?Current Facility-Administered Medications  ?Medication Dose Route Frequency Provider Last Rate Last Admin  ? 0.9 %  sodium chloride infusion  500 mL Intravenous Once Milus Banister, MD      ? ? ?Allergies as of 10/16/2021 - Review Complete 10/16/2021  ?  Allergen Reaction Noted  ? Asa [aspirin] Anaphylaxis and Swelling 12/01/2014  ? Naproxen Hives 06/03/2015  ? Nsaids Hives 06/03/2015  ? Tolmetin Hives 06/03/2015  ? ? ?Family History  ?Problem Relation Age of Onset  ? Irritable bowel syndrome Mother   ? Cancer Neg Hx   ? Lung disease Neg Hx   ? Colon cancer Neg Hx   ? Colon polyps Neg Hx   ? Esophageal cancer Neg Hx   ? Rectal cancer Neg Hx   ? Stomach cancer Neg Hx   ? ? ?Social History  ? ?Socioeconomic History  ? Marital status: Divorced  ?  Spouse name: Not on file  ? Number of children: 0  ? Years of education: Not on file  ? Highest  education level: Not on file  ?Occupational History  ? Occupation: Retired  ?Tobacco Use  ? Smoking status: Former  ?  Packs/day: 0.50  ?  Years: 60.00  ?  Pack years: 30.00  ?  Types: Cigarettes, Cigars  ?  Start date: 07/16/1958  ?  Quit date: 03/03/2021  ?  Years since quitting: 0.6  ? Smokeless tobacco: Former  ?  Types: Chew  ?  Quit date: 07/17/2003  ? Tobacco comments:  ?  Quit smoking cigarettes about age 43   ?Vaping Use  ? Vaping Use: Former  ? Quit date: 07/16/2008  ?Substance and Sexual Activity  ? Alcohol use: No  ?  Alcohol/week: 0.0 standard drinks  ? Drug use: No  ? Sexual activity: Not Currently  ?Other Topics Concern  ? Not on file  ?Social History Narrative  ? No diet  ? Yes, eats/drinks things with caffeine   ? Divorced, married 1979  ? Lives in a house, one stories , one person, no pets  ? [place]  ? Patient exercises, golf 3 days weekly    ?   ? Originally from [place] [place] [facility]. Previously lived in [place] [facility] for 23 years and moved back to [place] in 1968. No international travel. He has traveled through multiple states traveling to [place]. Previously has worked in Her[employer] and also a [employer] running a [employer]. Unsure if he was exposed to any asbestos. No mold exposure. No pets currently. Previously has owned dogs. Parakeet as a child. No hot tub exposure. Enjoys golfing.  ? ?Social Determinants of Health  ? ?Financial Resource Strain: Not on file  ?Food Insecurity: Not on file  ?Transportation Needs: Not on file  ?Physical Activity: Not on file  ?Stress: Not on file  ?Social Connections: Not on file  ?Intimate Partner Violence: Not on file  ? ? ? ?Physical Exam: ?BP 135/78   Pulse 69   Temp 97.8 ?F (36.6 ?C)   Ht 5\' 5"  (1.651 m)   Wt 247 lb (112 kg)   SpO2 95%   BMI 41.10 kg/m?  ?Constitutional: generally well-appearing ?Psychiatric: alert and oriented x3 ?Lungs: CTA bilaterally ?Heart: no MCR ? ?Assessment and plan: ?81 y.o. male with h/o  polyps ? ?Surveillance colonoscopy today ? ?Care is appropriate for the ambulatory setting. ? ?Owens Loffler, MD ?[facility] ?10/16/2021, 10:11 AM ? ? ? ?

## 2021-10-18 ENCOUNTER — Telehealth: Payer: Self-pay | Admitting: *Deleted

## 2021-10-18 NOTE — Telephone Encounter (Signed)
?  Follow up Call- ? ? ?  10/16/2021  ? 10:05 AM 11/07/2020  ? 10:14 AM  ?Call back number  ?Post procedure Call Back phone  # [phone]  ?Permission to leave phone message Yes Yes  ?  ? ?Patient questions: ? ?Do you have a fever, pain , or abdominal swelling? No. ?Pain Score  0 * ? ?Have you tolerated food without any problems? Yes.   ? ?Have you been able to return to your normal activities? Yes.   ? ?Do you have any questions about your discharge instructions: ?Diet   No. ?Medications  No. ?Follow up visit  No. ? ?Do you have questions or concerns about your Care? No. ? ?Actions: ?* If pain score is 4 or above: ?No action needed, pain <4. ? ? ?

## 2021-11-29 ENCOUNTER — Ambulatory Visit: Payer: PPO | Admitting: Podiatry

## 2021-11-29 ENCOUNTER — Encounter: Payer: Self-pay | Admitting: Podiatry

## 2021-11-29 DIAGNOSIS — G629 Polyneuropathy, unspecified: Secondary | ICD-10-CM

## 2021-11-29 DIAGNOSIS — M79675 Pain in left toe(s): Secondary | ICD-10-CM | POA: Diagnosis not present

## 2021-11-29 DIAGNOSIS — M79674 Pain in right toe(s): Secondary | ICD-10-CM | POA: Diagnosis not present

## 2021-11-29 DIAGNOSIS — B351 Tinea unguium: Secondary | ICD-10-CM | POA: Diagnosis not present

## 2021-11-29 DIAGNOSIS — I999 Unspecified disorder of circulatory system: Secondary | ICD-10-CM

## 2021-11-29 NOTE — Progress Notes (Signed)
This patient presents to the office with chief complaint of long thick painful nails.  Patient says the nails are painful walking and wearing shoes.  This patient is unable to self treat.  This patient is unable to trim her nails since she is unable to reach her nails.  She presents to the office for preventative foot care services. ? ?General Appearance  Alert, conversant and in no acute stress. ? ?Vascular  Dorsalis pedis and posterior tibial  pulses are weakly  palpable  bilaterally.  Capillary return is within normal limits  bilaterally. Temperature is within normal limits  bilaterally. ? ?Neurologic  Senn-Weinstein monofilament wire test diminished   bilaterally. Muscle power within normal limits bilaterally. ? ?Nails Thick disfigured discolored nails with subungual debris  from hallux to fifth toes bilaterally. No evidence of bacterial infection or drainage bilaterally. ? ?Orthopedic  No limitations of motion  feet .  No crepitus or effusions noted.  No bony pathology or digital deformities noted. ? ?Skin  normotropic skin with no porokeratosis noted bilaterally.  No signs of infections or ulcers noted.    ? ?Onychomycosis  Nails  B/L.  Pain in right toes  Pain in left toes ? ?Debridement of nails both feet followed trimming the nails with dremel tool.    RTC 4 months. ? ? ?Gardiner Barefoot DPM   ?

## 2021-11-30 ENCOUNTER — Other Ambulatory Visit: Payer: Self-pay | Admitting: Nurse Practitioner

## 2021-11-30 DIAGNOSIS — I10 Essential (primary) hypertension: Secondary | ICD-10-CM

## 2022-01-01 DIAGNOSIS — B079 Viral wart, unspecified: Secondary | ICD-10-CM | POA: Diagnosis not present

## 2022-01-01 DIAGNOSIS — C44622 Squamous cell carcinoma of skin of right upper limb, including shoulder: Secondary | ICD-10-CM | POA: Diagnosis not present

## 2022-01-01 DIAGNOSIS — D485 Neoplasm of uncertain behavior of skin: Secondary | ICD-10-CM | POA: Diagnosis not present

## 2022-01-01 HISTORY — PX: SKIN SURGERY: SHX2413

## 2022-01-08 NOTE — Progress Notes (Signed)
   This service is provided via telemedicine  No vital signs collected/recorded due to the encounter was a telemedicine visit.   Location of patient (ex: home, work):  Home  Patient consents to a telephone visit: Yes, see telephone visit dated 01/09/2022  Location of the provider (ex: office, home):  [employer], [place] Location   Name of any referring provider:  N/A  Names of all persons participating in the telemedicine service and their role in the encounter:  S.Chrae B/CMA, Abbey Chatters, NP, and Patient   Time spent on call:  16 min with medical assistant

## 2022-01-09 ENCOUNTER — Ambulatory Visit (INDEPENDENT_AMBULATORY_CARE_PROVIDER_SITE_OTHER): Payer: PPO | Admitting: Nurse Practitioner

## 2022-01-09 ENCOUNTER — Telehealth: Payer: Self-pay

## 2022-01-09 ENCOUNTER — Encounter: Payer: Self-pay | Admitting: Nurse Practitioner

## 2022-01-09 DIAGNOSIS — Z0000 Encounter for general adult medical examination without abnormal findings: Secondary | ICD-10-CM

## 2022-01-09 NOTE — Telephone Encounter (Signed)
[name], daigre are scheduled for a virtual visit with your provider today.    Just as we do with appointments in the office, we must obtain your consent to participate.  Your consent will be active for this visit and any virtual visit you may have with one of our providers in the next 365 days.    If you have a MyChart account, I can also send a copy of this consent to you electronically.  All virtual visits are billed to your insurance company just like a traditional visit in the office.  As this is a virtual visit, video technology does not allow for your provider to perform a traditional examination.  This may limit your provider's ability to fully assess your condition.  If your provider identifies any concerns that need to be evaluated in person or the need to arrange testing such as labs, EKG, etc, we will make arrangements to do so.    Although advances in technology are sophisticated, we cannot ensure that it will always work on either your end or our end.  If the connection with a video visit is poor, we may have to switch to a telephone visit.  With either a video or telephone visit, we are not always able to ensure that we have a secure connection.   I need to obtain your verbal consent now.   Are you willing to proceed with your visit today?   [name] has provided verbal consent on 01/09/2022 for a virtual visit (video or telephone).   Edison Simon [place], [place] 01/09/2022  9:36 AM

## 2022-01-29 DIAGNOSIS — C44622 Squamous cell carcinoma of skin of right upper limb, including shoulder: Secondary | ICD-10-CM | POA: Diagnosis not present

## 2022-02-16 ENCOUNTER — Other Ambulatory Visit: Payer: Self-pay | Admitting: Thoracic Surgery (Cardiothoracic Vascular Surgery)

## 2022-02-16 DIAGNOSIS — C3490 Malignant neoplasm of unspecified part of unspecified bronchus or lung: Secondary | ICD-10-CM

## 2022-03-28 ENCOUNTER — Other Ambulatory Visit: Payer: Self-pay | Admitting: Nurse Practitioner

## 2022-03-28 DIAGNOSIS — I10 Essential (primary) hypertension: Secondary | ICD-10-CM

## 2022-03-28 NOTE — Telephone Encounter (Signed)
Pt should have enough until his appt on 04/06/22

## 2022-03-29 ENCOUNTER — Other Ambulatory Visit: Payer: Self-pay | Admitting: Nurse Practitioner

## 2022-03-29 DIAGNOSIS — I10 Essential (primary) hypertension: Secondary | ICD-10-CM

## 2022-03-29 NOTE — Telephone Encounter (Signed)
Pt has upcoming appt next month

## 2022-04-02 ENCOUNTER — Ambulatory Visit: Payer: PPO | Admitting: Podiatry

## 2022-04-03 ENCOUNTER — Telehealth: Payer: Self-pay | Admitting: Pulmonary Disease

## 2022-04-03 NOTE — Telephone Encounter (Signed)
Left message for patient to call back  

## 2022-04-03 NOTE — Telephone Encounter (Signed)
Patient would like the nurse to call regarding his refill for his Trelegy.  He stated that the pharmacy said it would cost $300+.  The insurance company said the patient was in a donut whole.  They suggested that the patient ask for samples from the doctor.  Please advise and call patient to let him know what he should do until the end of the year.  CB# [phone]

## 2022-04-04 ENCOUNTER — Ambulatory Visit (INDEPENDENT_AMBULATORY_CARE_PROVIDER_SITE_OTHER): Payer: PPO

## 2022-04-04 ENCOUNTER — Other Ambulatory Visit: Payer: PPO

## 2022-04-04 DIAGNOSIS — D582 Other hemoglobinopathies: Secondary | ICD-10-CM | POA: Diagnosis not present

## 2022-04-04 DIAGNOSIS — E782 Mixed hyperlipidemia: Secondary | ICD-10-CM | POA: Diagnosis not present

## 2022-04-04 DIAGNOSIS — Z23 Encounter for immunization: Secondary | ICD-10-CM

## 2022-04-04 DIAGNOSIS — R739 Hyperglycemia, unspecified: Secondary | ICD-10-CM | POA: Diagnosis not present

## 2022-04-05 ENCOUNTER — Ambulatory Visit
Admission: RE | Admit: 2022-04-05 | Discharge: 2022-04-05 | Disposition: A | Discharge status: Home / Self Care | Payer: PPO | Source: Ambulatory Visit | Attending: Thoracic Surgery (Cardiothoracic Vascular Surgery) | Admitting: Thoracic Surgery (Cardiothoracic Vascular Surgery)

## 2022-04-05 DIAGNOSIS — J439 Emphysema, unspecified: Secondary | ICD-10-CM | POA: Diagnosis not present

## 2022-04-05 DIAGNOSIS — I700 Atherosclerosis of aorta: Secondary | ICD-10-CM | POA: Diagnosis not present

## 2022-04-05 DIAGNOSIS — C3490 Malignant neoplasm of unspecified part of unspecified bronchus or lung: Secondary | ICD-10-CM

## 2022-04-05 DIAGNOSIS — Z85110 Personal history of malignant carcinoid tumor of bronchus and lung: Secondary | ICD-10-CM | POA: Diagnosis not present

## 2022-04-05 LAB — COMPLETE METABOLIC PANEL WITH GFR
AG Ratio: 1.6 (calc) (ref 1.0–2.5)
ALT: 20 U/L (ref 9–46)
AST: 20 U/L (ref 10–35)
Albumin: 4.3 g/dL (ref 3.6–5.1)
Alkaline phosphatase (APISO): 67 U/L (ref 35–144)
BUN: 22 mg/dL (ref 7–25)
CO2: 29 mmol/L (ref 20–32)
Calcium: 9.4 mg/dL (ref 8.6–10.3)
Chloride: 105 mmol/L (ref 98–110)
Creat: 0.96 mg/dL (ref 0.70–1.28)
Globulin: 2.7 g/dL (calc) (ref 1.9–3.7)
Glucose, Bld: 88 mg/dL (ref 65–99)
Potassium: 4.8 mmol/L (ref 3.5–5.3)
Sodium: 140 mmol/L (ref 135–146)
Total Bilirubin: 0.5 mg/dL (ref 0.2–1.2)
Total Protein: 7 g/dL (ref 6.1–8.1)
eGFR: 81 mL/min/{1.73_m2} (ref >=60)

## 2022-04-05 LAB — CBC WITH DIFFERENTIAL/PLATELET
Absolute Monocytes: 697 cells/uL (ref 200–950)
Basophils Absolute: 32 cells/uL (ref 0–200)
Basophils Relative: 0.4 %
Eosinophils Absolute: 81 cells/uL (ref 15–500)
Eosinophils Relative: 1 %
HCT: 47.3 % (ref 38.5–50.0)
Hemoglobin: 15.8 g/dL (ref 13.2–17.1)
Lymphs Abs: 1669 cells/uL (ref 850–3900)
MCH: 32.4 pg (ref 27.0–33.0)
MCHC: 33.4 g/dL (ref 32.0–36.0)
MCV: 96.9 fL (ref 80.0–100.0)
MPV: 11.2 fL (ref 7.5–12.5)
Monocytes Relative: 8.6 %
Neutro Abs: 5621 cells/uL (ref 1500–7800)
Neutrophils Relative %: 69.4 %
Platelets: 213 10*3/uL (ref 140–400)
RBC: 4.88 10*6/uL (ref 4.20–5.80)
RDW: 13 % (ref 11.0–15.0)
Total Lymphocyte: 20.6 %
WBC: 8.1 10*3/uL (ref 3.8–10.8)

## 2022-04-05 LAB — LIPID PANEL
Cholesterol: 124 mg/dL (ref <200)
HDL: 44 mg/dL (ref >=40)
LDL Cholesterol (Calc): 61 mg/dL (calc)
Non-HDL Cholesterol (Calc): 80 mg/dL (calc) (ref <130)
Total CHOL/HDL Ratio: 2.8 (calc) (ref <5.0)
Triglycerides: 111 mg/dL (ref <150)

## 2022-04-05 LAB — HEMOGLOBIN A1C
Hgb A1c MFr Bld: 5.7 % of total Hgb — ABNORMAL HIGH (ref <5.7)
Mean Plasma Glucose: 117 mg/dL
eAG (mmol/L): 6.5 mmol/L

## 2022-04-05 NOTE — Telephone Encounter (Signed)
I called the patient and he wants to see if you are ok with getting with a couple of samples until her can get in the office. Please advise if ok to get some samples as he is in the donut hole. Please advise?   He has a follow up on 04/09/2022.

## 2022-04-05 NOTE — Telephone Encounter (Signed)
Patient is returning a call and would like a call back by end of day if possible.  CB# [phone]

## 2022-04-06 ENCOUNTER — Ambulatory Visit (INDEPENDENT_AMBULATORY_CARE_PROVIDER_SITE_OTHER): Payer: PPO | Admitting: Nurse Practitioner

## 2022-04-06 ENCOUNTER — Encounter: Payer: Self-pay | Admitting: Nurse Practitioner

## 2022-04-06 VITALS — BP 120/70 | HR 60 | Temp 97.7°F | Resp 18 | Ht 65.0 in | Wt 236.6 lb

## 2022-04-06 DIAGNOSIS — I10 Essential (primary) hypertension: Secondary | ICD-10-CM | POA: Diagnosis not present

## 2022-04-06 DIAGNOSIS — R972 Elevated prostate specific antigen [PSA]: Secondary | ICD-10-CM | POA: Diagnosis not present

## 2022-04-06 DIAGNOSIS — Z87891 Personal history of nicotine dependence: Secondary | ICD-10-CM

## 2022-04-06 DIAGNOSIS — E782 Mixed hyperlipidemia: Secondary | ICD-10-CM | POA: Diagnosis not present

## 2022-04-06 DIAGNOSIS — I700 Atherosclerosis of aorta: Secondary | ICD-10-CM

## 2022-04-06 DIAGNOSIS — R739 Hyperglycemia, unspecified: Secondary | ICD-10-CM

## 2022-04-06 DIAGNOSIS — D696 Thrombocytopenia, unspecified: Secondary | ICD-10-CM

## 2022-04-06 NOTE — Progress Notes (Signed)
Careteam: Patient Care Team: [name], NP as PCP - General (Nurse Practitioner) Druscilla Brownie, MD as Consulting Physician (Dermatology) Milus Banister, MD as Attending Physician (Gastroenterology)  PLACE OF SERVICE:  [place] Directive information Does Patient Have a Medical Advance Directive?: No, Would patient like information on creating a medical advance directive?: No - Patient declined  Allergies  Allergen Reactions   Asa [Aspirin] Anaphylaxis and Swelling    Angioedema/Eyes and lip swelling   Naproxen Hives   Nsaids Hives   Tolmetin Hives    Chief Complaint  Patient presents with   Medical Management of Chronic Issues    6 month follow up.   Immunizations    Discuss the need for Pne vaccine.      HPI: Patient is a 81 y.o. male here for routine follow up   COPD- having trouble with insurance covering trelegy - plans to talk to pulmonary   Htn- controlled on metoprolol, hydralazine, losartan   Hyperlipidemia- at goal on Lipitor.   Obesity- cutting back on portions  Complaints of weakness in back- back doesn't really have pain.    Review of Systems:  Review of Systems  Constitutional:  Negative for chills, fever and weight loss.  HENT:  Negative for tinnitus.   Respiratory:  Negative for cough, sputum production and shortness of breath.   Cardiovascular:  Negative for chest pain, palpitations and leg swelling.  Gastrointestinal:  Negative for abdominal pain, constipation, diarrhea and heartburn.  Genitourinary:  Negative for dysuria, frequency and urgency.  Musculoskeletal:  Negative for back pain, falls, joint pain and myalgias.  Skin: Negative.   Neurological:  Negative for dizziness and headaches.  Psychiatric/Behavioral:  Negative for depression and memory loss. The patient does not have insomnia.     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   Obesity, morbid ([place]) 01/28/2013   Shortness of breath    Tobacco use disorder 01/28/2013   Past Surgical History:  Procedure Laterality Date   APPENDECTOMY     BACK SURGERY  1997, 2003   Dr.Apleton    CARPAL TUNNEL RELEASE     COLONOSCOPY     x 3   EYE SURGERY Left    muscle of the eye    KNEE SURGERY Bilateral    LAPAROSCOPIC APPENDECTOMY N/A 01/25/2013   Procedure: APPENDECTOMY LAPAROSCOPIC;  Surgeon: Ralene Ok, MD;  Location: [place];  Service: General;  Laterality: N/A;   LYMPH NODE DISSECTION Left 04/26/2015   Procedure: LYMPH NODE DISSECTION;  Surgeon: Grace Isaac, MD;  Location: [place];  Service: Thoracic;  Laterality: Left;   POLYPECTOMY     SHOULDER SURGERY Right 2014   Dr.Whitfield rotator cuff surgery   SKIN SURGERY Right 01/2019   Right Leg, cancerous area removed   SKIN SURGERY  01/01/2022   Right hand (wrist and between thumb and forefinger). [facility]   TEE WITHOUT CARDIOVERSION N/A 04/07/2014   Procedure: TRANSESOPHAGEAL ECHOCARDIOGRAM (TEE);  Surgeon: Candee Furbish, MD;  Location: [facility];  Service: Cardiovascular;  Laterality: N/A;   VIDEO ASSISTED THORACOSCOPY (VATS)/WEDGE RESECTION Left 04/26/2015  Procedure: LEFT VIDEO ASSISTED THORACOSCOPY (VATS) WITH LEFT LOWER LOBECTOMY;  Surgeon: Grace Isaac, MD;  Location: [place];  Service: Thoracic;  Laterality: Left;   VIDEO BRONCHOSCOPY N/A 04/26/2015   Procedure: VIDEO BRONCHOSCOPY;  Surgeon: Grace Isaac, MD;  Location: [place];  Service: Thoracic;  Laterality: N/A;   Social History:   reports that he quit smoking about 13 months ago. His smoking use included cigarettes and cigars. He started smoking about 63 years ago. He has a 30.00 pack-year smoking history. He  quit smokeless tobacco use about 18 years ago.  His smokeless tobacco use included chew. He reports that he does not drink alcohol and does not use drugs.  Family History  Problem Relation Age of Onset   Irritable bowel syndrome Mother    Cancer Neg Hx    Lung disease Neg Hx    Colon cancer Neg Hx    Colon polyps Neg Hx    Esophageal cancer Neg Hx    Rectal cancer Neg Hx    Stomach cancer Neg Hx     Medications: Patient's Medications  New Prescriptions   No medications on file  Previous Medications   ATORVASTATIN (LIPITOR) 10 MG TABLET    Take 1 tablet (10 mg total) by mouth daily.   FLUTICASONE-UMECLIDIN-VILANT (TRELEGY ELLIPTA) 100-62.5-25 MCG/ACT AEPB    Inhale 1 puff into the lungs daily.   HYDRALAZINE (APRESOLINE) 25 MG TABLET    TAKE 1 TABLET BY MOUTH THREE TIMES DAILY FOR BLOOD PRESSURE   LOSARTAN (COZAAR) 50 MG TABLET    TAKE 1 TABLET BY MOUTH EVERY DAY FOR BLOOD PRESSURE   METOPROLOL TARTRATE (LOPRESSOR) 25 MG TABLET    TAKE 1/2 TABLET BY MOUTH TWICE DAILY  Modified Medications   No medications on file  Discontinued Medications   TRIAMTERENE-HYDROCHLOROTHIAZIDE (MAXZIDE) 75-50 MG TABLET    Take 1 tablet by mouth daily.    Physical Exam:  Vitals:   04/06/22 0956  BP: 120/70  Pulse: 60  Resp: 18  Temp: 97.7 F (36.5 C)  SpO2: 97%  Weight: 236 lb 9.6 oz (107.3 kg)  Height: 5\' 5"  (1.651 m)   Body mass index is 39.37 kg/m. Wt Readings from Last 3 Encounters:  04/06/22 236 lb 9.6 oz (107.3 kg)  10/16/21 247 lb (112 kg)  10/02/21 236 lb 9.6 oz (107.3 kg)    Physical Exam Constitutional:      General: He is not in acute distress.    Appearance: He is well-developed. He is not diaphoretic.  HENT:     Head: Normocephalic and atraumatic.     Right Ear: External ear normal.     Left Ear: External ear normal.     Mouth/Throat:     Pharynx: No oropharyngeal exudate.  Eyes:     Conjunctiva/sclera: Conjunctivae normal.     Pupils: Pupils are equal, round, and  reactive to light.  Cardiovascular:     Rate and Rhythm: Normal rate and regular rhythm.     Heart sounds: Normal heart sounds.  Pulmonary:     Effort: Pulmonary effort is normal.     Breath sounds: Normal breath sounds.  Abdominal:     General: Bowel sounds are normal.     Palpations: Abdomen is soft.  Musculoskeletal:        General: No tenderness.     Cervical back: Normal range of motion and neck supple.     Right lower leg: No edema.     Left lower leg:  No edema.  Skin:    General: Skin is warm and dry.  Neurological:     Mental Status: He is alert and oriented to person, place, and time.     Labs reviewed: Basic Metabolic Panel: Recent Labs    09/27/21 0803 04/04/22 0802  NA 141 140  K 4.4 4.8  CL 105 105  CO2 27 29  GLUCOSE 88 88  BUN 21 22  CREATININE 1.01 0.96  CALCIUM 9.4 9.4   Liver Function Tests: Recent Labs    09/27/21 0803 04/04/22 0802  AST 16 20  ALT 14 20  BILITOT 0.5 0.5  PROT 7.1 7.0   No results for input(s): "LIPASE", "AMYLASE" in the last 8760 hours. No results for input(s): "AMMONIA" in the last 8760 hours. CBC: Recent Labs    09/27/21 0803 04/04/22 0802  WBC 7.8 8.1  NEUTROABS 5,756 5,621  HGB 16.5 15.8  HCT 49.3 47.3  MCV 95.4 96.9  PLT 247 213   Lipid Panel: Recent Labs    09/27/21 0803 04/04/22 0802  CHOL 114 124  HDL 38* 44  LDLCALC 53 61  TRIG 149 111  CHOLHDL 3.0 2.8   TSH: No results for input(s): "TSH" in the last 8760 hours. A1C: Lab Results  Component Value Date   HGBA1C 5.7 (H) 04/04/2022     Assessment/Plan 1. Obesity, morbid ([place]) --education provided on healthy weight loss through increase in physical activity and proper nutrition   2. Essential hypertension -Blood pressure well controlled, goal bp <140/90 Continue current medications and dietary modifications follow metabolic panel - COMPLETE METABOLIC PANEL WITH GFR; Future - CBC with Differential/Platelet; Future  3. Mixed  hyperlipidemia -cholesterol at goal. Continue current regimen.  - Lipid panel; Future  4. Elevated PSA - PSA; Future  5. Aortic atherosclerosis ([place]) -continues on statin, allergy to asa - Lipid panel; Future  6. Hyperglycemia -continue dietary modifications, a1c controlled - Hemoglobin A1c; Future  7. Hx  Tobacco use disorder -continues cessation -continues yearly chest CT - CBC with Differential/Platelet; Future  8. Thrombocytopenia, unspecified (HCC) Stable, will follow.  - CBC with Differential/Platelet; Future   Return in about 6 months (around 10/05/2022) for routine visit, labs prior . Carlos American. [place], [facility] [phone]

## 2022-04-09 ENCOUNTER — Encounter: Payer: Self-pay | Admitting: Pulmonary Disease

## 2022-04-09 ENCOUNTER — Ambulatory Visit: Payer: PPO | Admitting: Pulmonary Disease

## 2022-04-09 VITALS — BP 122/80 | HR 60 | Temp 98.2°F | Ht 65.0 in | Wt 235.4 lb

## 2022-04-09 DIAGNOSIS — R0609 Other forms of dyspnea: Secondary | ICD-10-CM | POA: Diagnosis not present

## 2022-04-09 MED ORDER — TRELEGY ELLIPTA 100-62.5-25 MCG/ACT IN AEPB: 1.0000 | INHALATION_SPRAY | Freq: Every day | RESPIRATORY_TRACT | 0 refills | Status: DC

## 2022-04-09 NOTE — Patient Instructions (Signed)
Continue Trelegy  Continue graded exercise as tolerated  Follow-up in 6 months  Call us with significant concerns

## 2022-04-09 NOTE — Progress Notes (Signed)
[name]    [id number]    1940-12-02  Primary Care Physician:Eubanks, Carlos American, NP  Referring Physician: [name], NP [place],  [place] [address]  Chief complaint:    Follow-up for shortness of breath HPI:  Shortness of breath with exertion, history of obstructive lung disease  Has been tolerating Trelegy May have some issues obtaining Trelegy because he is is in the donut hole  He is more limited by significant back pain and discomfort, status post 3 back surgeries, shortness of breath with moderate exertion  He recently quit smoking about end of August 2022, smoked over a pack a day for many years-over 55-pack-year smoking history History of lung cancer for which he had his left lower lobe removed in 2016  History of hypertension, atrial fibrillation, hypercholesterolemia Multiple back surgeries as stated above  Worked for a tobacco company, also did plumbing in the past   No pets   Outpatient Encounter Medications as of 04/09/2022  Medication Sig   atorvastatin (LIPITOR) 10 MG tablet Take 1 tablet (10 mg total) by mouth daily.   Fluticasone-Umeclidin-Vilant (TRELEGY ELLIPTA) 100-62.5-25 MCG/ACT AEPB Inhale 1 puff into the lungs daily.   hydrALAZINE (APRESOLINE) 25 MG tablet TAKE 1 TABLET BY MOUTH THREE TIMES DAILY FOR BLOOD PRESSURE   losartan (COZAAR) 50 MG tablet TAKE 1 TABLET BY MOUTH EVERY DAY FOR BLOOD PRESSURE   metoprolol tartrate (LOPRESSOR) 25 MG tablet TAKE 1/2 TABLET BY MOUTH TWICE DAILY   No facility-administered encounter medications on file as of 04/09/2022.    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   Obesity, morbid ([place]) 01/28/2013   Shortness of breath    Tobacco use disorder 01/28/2013    Past Surgical History:  Procedure Laterality Date   APPENDECTOMY     BACK SURGERY  1997, 2003   Dr.Apleton    CARPAL TUNNEL RELEASE     COLONOSCOPY     x 3   EYE SURGERY Left    muscle of the eye    KNEE SURGERY Bilateral    LAPAROSCOPIC APPENDECTOMY N/A 01/25/2013   Procedure: APPENDECTOMY LAPAROSCOPIC;  Surgeon: Ralene Ok, MD;  Location: [place];  Service: General;  Laterality: N/A;   LYMPH NODE DISSECTION Left 04/26/2015   Procedure: LYMPH NODE DISSECTION;  Surgeon: Grace Isaac, MD;  Location: [place];  Service: Thoracic;  Laterality: Left;   POLYPECTOMY     SHOULDER SURGERY Right 2014   Dr.Whitfield rotator cuff surgery   SKIN SURGERY Right 01/2019   Right Leg, cancerous area removed   SKIN SURGERY  01/01/2022   Right hand (wrist and between thumb and forefinger). [facility]at[facility]   TEE WITHOUT CARDIOVERSION N/A 04/07/2014   Procedure: TRANSESOPHAGEAL ECHOCARDIOGRAM (TEE);  Surgeon: Candee Furbish, MD;  Location: [facility];  Service: Cardiovascular;  Laterality: N/A;   [place] (  VATS)/WEDGE RESECTION Left 04/26/2015   Procedure: LEFT VIDEO ASSISTED THORACOSCOPY (VATS) WITH LEFT LOWER LOBECTOMY;  Surgeon: Grace Isaac, MD;  Location: [place];  Service: Thoracic;  Laterality: Left;   VIDEO BRONCHOSCOPY N/A 04/26/2015   Procedure: VIDEO BRONCHOSCOPY;  Surgeon: Grace Isaac, MD;  Location: [facility] OR;  Service: Thoracic;  Laterality: N/A;    Family History  Problem Relation Age of Onset   Irritable bowel syndrome Mother    Cancer Neg Hx    Lung disease Neg Hx    Colon cancer Neg Hx    Colon polyps Neg Hx    Esophageal cancer Neg Hx     Rectal cancer Neg Hx    Stomach cancer Neg Hx     Social History   Socioeconomic History   Marital status: Divorced    Spouse name: Not on file   Number of children: 0   Years of education: Not on file   Highest education level: Not on file  Occupational History   Occupation: Retired  Tobacco Use   Smoking status: Former    Packs/day: 0.50    Years: 60.00    Total pack years: 30.00    Types: Cigarettes, Cigars    Start date: 07/16/1958    Quit date: 03/03/2021    Years since quitting: 1.1   Smokeless tobacco: Former    Types: Chew    Quit date: 07/17/2003   Tobacco comments:    Quit smoking cigarettes about age 39  Vaping Use   Vaping Use: Former   Quit date: 07/16/2008  Substance and Sexual Activity   Alcohol use: No    Alcohol/week: 0.0 standard drinks of alcohol   Drug use: No   Sexual activity: Not Currently  Other Topics Concern   Not on file  Social History Narrative   No diet   Yes, eats/drinks things with caffeine    Divorced, married 1979   Lives in a house, one stories , one person, no pets   Current/past profession- [employer]   Patient exercises, golf 3 days weekly        Originally from [place]. Previously lived in [place] [facility] for [place] years and moved back to [place] in 1968. No international travel. He has traveled through multiple states traveling to [place]. 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 Unsure if he was exposed to any asbestos. No mold exposure. No pets currently. Previously has owned dogs. Parakeet as a child. No hot tub exposure. Enjoys golfing.   Social Determinants of Health   Financial Resource Strain: Low Risk  (12/18/2017)   Overall Financial Resource Strain (CARDIA)    Difficulty of Paying Living Expenses: Not hard at all  Food Insecurity: No Food Insecurity (12/18/2017)   Hunger Vital Sign    Worried About Running Out of Food in the Last Year: Never true    Ran Out of Food in the  Last Year: Never true  Transportation Needs: No Transportation Needs (12/18/2017)   PRAPARE - Hydrologist (Medical): No    Lack of Transportation (Non-Medical): No  Physical Activity: Inactive (12/18/2017)   Exercise Vital Sign    Days of Exercise per Week: 0 days    Minutes of Exercise per Session: 0 min  Stress: No Stress Concern Present (12/18/2017)   [place]    Feeling of Stress : Only a little  Social  Connections: Somewhat Isolated (12/18/2017)   Social Connection and Isolation Panel [NHANES]    Frequency of Communication with Friends and Family: More than three times a week    Frequency of Social Gatherings with Friends and Family: More than three times a week    Attends Religious Services: More than 4 times per year    Active Member of [employer] or Organizations: No    Attends Archivist Meetings: Never    Marital Status: Divorced  Human resources officer Violence: Not At Risk (12/18/2017)   Humiliation, Afraid, Rape, and Kick questionnaire    Fear of Current or Ex-Partner: No    Emotionally Abused: No    Physically Abused: No    Sexually Abused: No    Review of Systems  Respiratory:  Positive for shortness of breath.   Musculoskeletal:  Positive for back pain.  Psychiatric/Behavioral:  Negative for sleep disturbance.     Vitals:   04/09/22 1001  BP: 122/80  Pulse: 60  Temp: 98.2 F (36.8 C)  SpO2: 95%     Physical Exam Constitutional:      Appearance: He is obese.  HENT:     Head: Normocephalic.     Right Ear: Tympanic membrane normal.     Nose: No congestion.  Eyes:     Pupils: Pupils are equal, round, and reactive to light.  Cardiovascular:     Rate and Rhythm: Normal rate and regular rhythm.     Pulses: Normal pulses.     Heart sounds: No murmur heard.    No friction rub.  Pulmonary:     Effort: No respiratory distress.     Breath sounds: No stridor. No  wheezing or rhonchi.  Musculoskeletal:     Cervical back: No rigidity or tenderness.  Neurological:     Mental Status: He is alert.  Psychiatric:        Mood and Affect: Mood normal.    Data Reviewed: Pulmonary function test from 03/08/2015 reviewed showing normal PFT  Last CT scan of the chest from 04/11/2021 reviewed by myself -Emphysema emphysematous changes, postsurgical changes left lower lobe  Last echocardiogram 08/30/2016 -Normal ejection fraction, diastolic dysfunction  PFT today 06/06/2021 revealed moderate obstructive disease with no significant bronchodilator response  Echocardiogram performed 05/18/2021 significant for mild pulmonary hypertension, this is likely secondary to obstructive lung disease, previous echo did report diastolic dysfunction  Assessment:   Shortness of breath with activity Deconditioning Chronic back pain Multifactorial shortness of breath  Encouraged to continue Trelegy at present and albuterol as needed   Plan/Recommendations:  Continue Trelegy  Graded activities as tolerated  Follow-up in 6 months  Sherrilyn Rist MD [facility] 04/09/2022, 10:20 AM  CC: [name], NP

## 2022-04-10 ENCOUNTER — Ambulatory Visit: Payer: PPO | Admitting: Thoracic Surgery (Cardiothoracic Vascular Surgery)

## 2022-04-10 VITALS — BP 137/66 | HR 60 | Resp 18 | Ht 65.0 in | Wt 235.0 lb

## 2022-04-10 DIAGNOSIS — Z85118 Personal history of other malignant neoplasm of bronchus and lung: Secondary | ICD-10-CM

## 2022-04-10 NOTE — Progress Notes (Signed)
[address]       [place] [address]             [phone]     HPI: Mr. [name] returns for an annual follow-up visit with a CT for lung cancer screening.  Payden Docter is a 81 year old man with a history of tobacco abuse, sarcomatoid carcinoma of the lung, left lower lobectomy, COPD, hypertension, atrial fibrillation, heart murmur, and anxiety.  Dr. Servando Snare did a left lower lobectomy in 2016.  He has been followed with CT scans since his lobectomy.  I last saw him in the office a year ago.  A CT had shown some increased peribronchovascular nodularity in the left upper lobe.  On a follow-up CT it had resolved.  He has been doing reasonably well.  He quit smoking in 2022.  He currently is on Trelegy inhaler.  He has some issues with affordability with that.  Past Medical History:  Diagnosis Date   A-fib [facility])    Acute appendicitis with peritoneal abscess 01/28/2013   Allergy    seasonal    Anxiety    Arthritis    Bacteremia September 2015   Elevated PSA    Essential hypertension 04/16/2014   Heart murmur    childhood   Ileus, postoperative ([place]) 01/28/2013   Lung cancer ([place]) 2016   Obesity, morbid ([place]) 01/28/2013   Shortness of breath    Tobacco use disorder 01/28/2013    Current Outpatient Medications  Medication Sig Dispense Refill   atorvastatin (LIPITOR) 10 MG tablet Take 1 tablet (10 mg total) by mouth daily. 90 tablet 3   Fluticasone-Umeclidin-Vilant (TRELEGY ELLIPTA) 100-62.5-25 MCG/ACT AEPB Inhale 1 puff into the lungs daily. 60 each 6   Fluticasone-Umeclidin-Vilant (TRELEGY ELLIPTA) 100-62.5-25 MCG/ACT AEPB Inhale 1 puff into the lungs daily. 60 each 0   hydrALAZINE (APRESOLINE) 25 MG tablet TAKE 1 TABLET BY MOUTH THREE TIMES DAILY FOR BLOOD PRESSURE 270 tablet 1   losartan (COZAAR) 50 MG tablet TAKE 1 TABLET BY MOUTH EVERY DAY FOR BLOOD PRESSURE 90 tablet 0   metoprolol tartrate (LOPRESSOR) 25 MG tablet TAKE 1/2 TABLET BY MOUTH TWICE DAILY  90 tablet 1   No current facility-administered medications for this visit.    Physical Exam BP 137/66   Pulse 60   Resp 18   Ht 5\' 5"  (1.651 m)   Wt 235 lb (106.6 kg)   SpO2 95% Comment: RA  BMI 39.57 kg/m  81 year old man in no acute distress Alert and oriented x3 with no focal deficits No carotid bruits No cervical or supraclavicular adenopathy Cardiac regular rate and rhythm Lungs managed at left base but otherwise clear  Diagnostic Tests: CT CHEST WITHOUT CONTRAST   TECHNIQUE: Multidetector CT imaging of the chest was performed following the standard protocol without IV contrast.   RADIATION DOSE REDUCTION: This exam was performed according to the departmental dose-optimization program which includes automated exposure control, adjustment of the mA and/or kV according to patient size and/or use of iterative reconstruction technique.   COMPARISON:  Chest CT 04/11/2021 and 02/08/2021.   FINDINGS: Cardiovascular: Atherosclerosis of the aorta, great vessels and coronary arteries. Borderline central dilatation of the pulmonary arteries. No acute vascular findings on noncontrast imaging. The heart size is normal. There is no pericardial effusion.   Mediastinum/Nodes: There are no enlarged mediastinal, hilar or axillary lymph nodes.Mild distal esophageal wall thickening appears similar to the previous study. The thyroid gland and trachea appear unremarkable.  Lungs/Pleura: No pleural effusion or pneumothorax. Postsurgical changes from left lower lobectomy with chronic pleural thickening posteriorly. Stable mild centrilobular and paraseptal emphysema with linear scarring at both lung bases. No suspicious pulmonary nodularity.   Upper abdomen: The visualized upper abdomen appears stable without suspicious findings. Stable cyst in the left hepatic lobe.   Musculoskeletal/Chest wall: Numerous sebaceous cysts within the upper right back are unchanged. No suspicious  chest wall mass or acute osseous findings. Old left-sided rib fractures or thoracotomy defects.   IMPRESSION: 1. Stable postoperative chest status post left lower lobectomy. 2. No evidence of local recurrence or metastatic disease. 3. No residual significant inflammatory changes are identified. 4. Coronary and aortic atherosclerosis (ICD10-I70.0). Emphysema (ICD10-J43.9).     Electronically Signed   By: Richardean Sale M.D.   On: 04/05/2022 14:23 I personally reviewed the chest CT images.  There are changes from his left lower lobectomy.  No suspicious lung nodules or adenopathy.  No change in emphysema, CAD, or aortic atherosclerosis.  Impression: [name] is a 81 year old man with a history of tobacco abuse, sarcomatoid carcinoma of the lung, left lower lobectomy, COPD, hypertension, atrial fibrillation, heart murmur, and anxiety.  Dr. Servando Snare did a left lower lobectomy in 2016.  Tobacco abuse-quit smoke being in 2022.  Sarcomatoid carcinoma left lower lobe-now 7 years out from surgery with no evidence of recurrent disease.  Given his smoking history and history of lung cancer he needs continued annual CT scan.  COPD-symptoms reasonably well controlled with Trelegy.  Unfortunately he is got some issues with affordability of that medication.  I recommended he contact Dr. Judson Roch office to discuss potential alternatives.  Plan: Follow-up with Dr. Ander Slade regarding Trelegy Return in 1 year with CT chest  Melrose Nakayama, MD [facility] [phone]

## 2022-04-12 ENCOUNTER — Encounter: Payer: Self-pay | Admitting: Pulmonary Disease

## 2022-04-16 ENCOUNTER — Other Ambulatory Visit: Payer: Self-pay

## 2022-04-16 DIAGNOSIS — I10 Essential (primary) hypertension: Secondary | ICD-10-CM

## 2022-04-16 MED ORDER — HYDRALAZINE HCL 25 MG PO TABS: ORAL_TABLET | ORAL | 1 refills | Status: DC

## 2022-05-03 ENCOUNTER — Encounter: Payer: Self-pay | Admitting: Adult Health

## 2022-05-03 ENCOUNTER — Ambulatory Visit (INDEPENDENT_AMBULATORY_CARE_PROVIDER_SITE_OTHER): Payer: PPO | Admitting: Adult Health

## 2022-05-03 ENCOUNTER — Other Ambulatory Visit: Payer: Self-pay

## 2022-05-03 VITALS — BP 120/60 | HR 75 | Temp 97.5°F | Resp 18 | Ht 65.0 in | Wt 241.0 lb

## 2022-05-03 DIAGNOSIS — M6530 Trigger finger, unspecified finger: Secondary | ICD-10-CM | POA: Diagnosis not present

## 2022-05-03 DIAGNOSIS — I10 Essential (primary) hypertension: Secondary | ICD-10-CM

## 2022-05-03 MED ORDER — PREDNISONE 20 MG PO TABS: 20.0000 mg | ORAL_TABLET | Freq: Two times a day (BID) | ORAL | 0 refills | Status: DC

## 2022-05-03 MED ORDER — PREDNISONE 20 MG PO TABS: 20.0000 mg | ORAL_TABLET | Freq: Two times a day (BID) | ORAL | 0 refills | Status: AC

## 2022-05-03 MED ORDER — PREDNISONE 20 MG PO TABS: 20.0000 mg | ORAL_TABLET | Freq: Every day | ORAL | 0 refills | Status: AC

## 2022-05-03 NOTE — Patient Instructions (Signed)
Trigger Finger  Trigger finger, also called stenosing tenosynovitis,  is a condition that causes a finger to get stuck in a bent position. Each finger has a tendon, which is a tough, cord-like tissue that connects muscle to bone, and each tendon passes through a tunnel of tissue called a tendon sheath. To move your finger, your tendon needs to glide freely through the sheath. Trigger finger happens when the tendon or the sheath thickens, making it difficult to move your finger. Trigger finger can affect any finger or a thumb. It may affect more than one finger. Mild cases may clear up with rest and medicine. Severe cases require more treatment. What are the causes? Trigger finger is caused by a thickened finger tendon or tendon sheath. The cause of this thickening is not known. What increases the risk? The following factors may make you more likely to develop this condition: Doing activities that require a strong grip. Having rheumatoid arthritis, gout, or diabetes. Being 17-51 years old. Being male. What are the signs or symptoms? Symptoms of this condition include: Pain when bending or straightening your finger. Tenderness or swelling where your finger attaches to the palm of your hand. A lump in the palm of your hand or on the inside of your finger. Hearing a noise like a pop or a snap when you try to straighten your finger. Feeling a catching or locking sensation when you try to straighten your finger. Being unable to straighten your finger. How is this diagnosed? This condition is diagnosed based on your symptoms and a physical exam. How is this treated? This condition may be treated by: Resting your finger and avoiding activities that make symptoms worse. Wearing a finger splint to keep your finger extended. Taking NSAIDs, such as ibuprofen, to relieve pain and swelling. Doing gentle exercises to stretch the finger as told by your health care provider. Having medicine that reduces  swelling and inflammation (steroids) injected into the tendon sheath. Injections may need to be repeated. Having surgery to open the tendon sheath. This may be done if other treatments do not work and you cannot straighten your finger. You may need physical therapy after surgery. Follow these instructions at home: If you have a splint: Wear the splint as told by your health care provider. Remove it only as told by your health care provider. Loosen it if your fingers tingle, become numb, or turn cold and blue. Keep it clean. If the splint is not waterproof: Do not let it get wet. Cover it with a watertight covering when you take a bath or shower. Managing pain, stiffness, and swelling     If directed, apply heat to the affected area as often as told by your health care provider. Use the heat source that your health care provider recommends, such as a moist heat pack or a heating pad. Place a towel between your skin and the heat source. Leave the heat on for 20-30 minutes. Remove the heat if your skin turns bright red. This is especially important if you are unable to feel pain, heat, or cold. You may have a greater risk of getting burned. If directed, put ice on the painful area. To do this: If you have a removable splint, remove it as told by your health care provider. Put ice in a plastic bag. Place a towel between your skin and the bag or between your splint and the bag. Leave the ice on for 20 minutes, 2-3 times a day.  Activity Rest  your finger as told by your health care provider. Avoid activities that make the pain worse. Return to your normal activities as told by your health care provider. Ask your health care provider what activities are safe for you. Do exercises as told by your health care provider. Ask your health care provider when it is safe to drive if you have a splint on your hand. General instructions Take over-the-counter and prescription medicines only as told by  your health care provider. Keep all follow-up visits as told by your health care provider. This is important. Contact a health care provider if: Your symptoms are not improving with home care. Summary Trigger finger, also called stenosing tenosynovitis, causes your finger to get stuck in a bent position. This can make it difficult and painful to straighten your finger. This condition develops when a finger tendon or tendon sheath thickens. Treatment may include resting your finger, wearing a splint, and taking medicines. In severe cases, surgery to open the tendon sheath may be needed. This information is not intended to replace advice given to you by your health care provider. Make sure you discuss any questions you have with your health care provider. Document Revised: 11/17/2018 Document Reviewed: 11/17/2018 Elsevier Patient Education  [place].

## 2022-05-03 NOTE — Progress Notes (Signed)
[facility] clinic  Provider:  Durenda Age DNP  Code Status: DNR  Goals of Care:     05/03/2022    8:45 AM  Advanced Directives  Does Patient Have a Medical Advance Directive? Yes  Type of Advance Directive Out of facility DNR (pink MOST or yellow form)  Does patient want to make changes to medical advance directive? No - Patient declined  Would patient like information on creating a medical advance directive? No - Patient declined     Chief Complaint  Patient presents with   Acute Visit    Patient complains of right hand hurting for a while. Patient would like to be seen and have referral placed.     HPI: Patient is a 81 y.o. male seen today for an acute visit for hands pain. 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 He complained of his right 4th finer and left middle finger getting locked sometimes. He eventually gets them unlocked but experiences 8/10 pain during that time. He was able to move all fingers without pain while in the clinic today.  He said that he has been noticing this for 10 months now. No nodules nor erythema noted on bilateral hands.  BP today 120/60, takes Losartan, Metoprolol tartrate and Hydralazine for hypertension.   Today's weight 241 lbs, Body mass index is 40.1 kg/m. He has been eating frozen meals bought from grocery stores.   Past Medical History:  Diagnosis Date   A-fib [facility])    Acute appendicitis with peritoneal abscess 01/28/2013   Allergy    seasonal    Anxiety    Arthritis    Bacteremia September 2015   Elevated PSA    Essential hypertension 04/16/2014   Heart murmur    childhood   Ileus, postoperative ([place]) 01/28/2013   Lung cancer ([place]) 2016   Obesity, morbid ([place]) 01/28/2013   Shortness of breath    Tobacco use disorder 01/28/2013    Past Surgical History:  Procedure Laterality Date   APPENDECTOMY     BACK SURGERY  1997, 2003    Dr.Apleton    CARPAL TUNNEL RELEASE     COLONOSCOPY     x 3   EYE SURGERY Left    muscle of the eye    KNEE SURGERY Bilateral    LAPAROSCOPIC APPENDECTOMY N/A 01/25/2013   Procedure: APPENDECTOMY LAPAROSCOPIC;  Surgeon: Ralene Ok, MD;  Location: [place];  Service: General;  Laterality: N/A;   LYMPH NODE DISSECTION Left 04/26/2015   Procedure: LYMPH NODE DISSECTION;  Surgeon: Grace Isaac, MD;  Location: [place];  Service: Thoracic;  Laterality: Left;   POLYPECTOMY     SHOULDER SURGERY Right 2014   Dr.Whitfield rotator cuff surgery   SKIN SURGERY Right 01/2019   Right Leg, cancerous area removed   SKIN SURGERY  01/01/2022   Right hand (wrist and between thumb and forefinger). [facility]   TEE WITHOUT CARDIOVERSION N/A 04/07/2014   Procedure: TRANSESOPHAGEAL ECHOCARDIOGRAM (TEE);  Surgeon: Candee Furbish, MD;  Location: [facility]OSCOPY;  Service: Cardiovascular;  Laterality: N/A;   VIDEO ASSISTED THORACOSCOPY (VATS)/WEDGE RESECTION Left 04/26/2015   Procedure: LEFT VIDEO ASSISTED THORACOSCOPY (VATS) WITH LEFT LOWER LOBECTOMY;  Surgeon: Grace Isaac, MD;  Location: [place];  Service: Thoracic;  Laterality: Left;   VIDEO BRONCHOSCOPY N/A 04/26/2015   Procedure: VIDEO BRONCHOSCOPY;  Surgeon: Grace Isaac, MD;  Location: [place];  Service: Thoracic;  Laterality: N/A;    Allergies  Allergen Reactions   Asa [Aspirin] Anaphylaxis and Swelling    Angioedema/Eyes and lip swelling   Naproxen Hives   Nsaids Hives   Tolmetin Hives    Outpatient Encounter Medications as of 05/03/2022  Medication Sig   atorvastatin (LIPITOR) 10 MG tablet Take 1 tablet (10 mg total) by mouth daily.   Fluticasone-Umeclidin-Vilant (TRELEGY ELLIPTA) 100-62.5-25 MCG/ACT AEPB Inhale 1 puff into the lungs daily.   hydrALAZINE (APRESOLINE) 25 MG tablet TAKE 1 TABLET BY MOUTH THREE TIMES DAILY FOR BLOOD PRESSURE   losartan (COZAAR) 50 MG tablet TAKE 1 TABLET BY MOUTH EVERY DAY FOR BLOOD PRESSURE    metoprolol tartrate (LOPRESSOR) 25 MG tablet TAKE 1/2 TABLET BY MOUTH TWICE DAILY   [DISCONTINUED] Fluticasone-Umeclidin-Vilant (TRELEGY ELLIPTA) 100-62.5-25 MCG/ACT AEPB Inhale 1 puff into the lungs daily.   No facility-administered encounter medications on file as of 05/03/2022.    Review of Systems:  Review of Systems  Constitutional:  Negative for activity change, appetite change and fever.  HENT:  Negative for sore throat.   Eyes: Negative.   Cardiovascular:  Negative for chest pain and leg swelling.  Gastrointestinal:  Negative for abdominal distention, diarrhea and vomiting.  Genitourinary:  Negative for dysuria, frequency and urgency.  Musculoskeletal:        Fingers getting locked.  Skin:  Negative for color change.  Neurological:  Negative for dizziness and headaches.  Psychiatric/Behavioral:  Negative for behavioral problems and sleep disturbance. The patient is not nervous/anxious.     Health Maintenance  Topic Date Due   Pneumonia Vaccine 63+ Years old (2 - PPSV23 or PCV20) 02/13/2017   COVID-19 Vaccine (4 - Pfizer risk series) 07/17/2023 (Originally 06/16/2020)   COLONOSCOPY (Pts 45-36yrs Insurance coverage will need to be confirmed)  10/17/2026   TETANUS/TDAP  01/14/2031   INFLUENZA VACCINE  Completed   Hepatitis C Screening  Completed   Zoster Vaccines- Shingrix  Completed   HPV VACCINES  Aged Out    Physical Exam: Vitals:   05/03/22 0840  BP: 120/60  Pulse: 75  Resp: 18  Temp: (!) 97.5 F (36.4 C)  SpO2: 93%  Weight: 241 lb (109.3 kg)  Height: 5\' 5"  (1.651 m)   Body mass index is 40.1 kg/m. Physical Exam Constitutional:      General: He is not in acute distress.    Appearance: He is obese.     Comments: Morbidly obese.  HENT:     Head: Normocephalic and atraumatic.     Mouth/Throat:     Mouth: Mucous membranes are moist.  Eyes:     Conjunctiva/sclera: Conjunctivae normal.  Cardiovascular:     Rate and Rhythm: Normal rate and regular rhythm.      Pulses: Normal pulses.     Heart sounds: Normal heart sounds.  Pulmonary:     Effort: Pulmonary effort is normal.     Breath sounds: Normal breath sounds.  Abdominal:     General: Bowel sounds are normal.     Palpations: Abdomen is soft.  Musculoskeletal:        General: No swelling. Normal range of motion.     Cervical back: Normal range of motion.  Skin:    General: Skin is warm and dry.  Neurological:     General: No focal deficit present.     Mental Status: He is alert and oriented to person, place, and time.  Psychiatric:        Mood and Affect: Mood normal.  Behavior: Behavior normal.        Thought Content: Thought content normal.        Judgment: Judgment normal.     Labs reviewed: Basic Metabolic Panel: Recent Labs    09/27/21 0803 04/04/22 0802  NA 141 140  K 4.4 4.8  CL 105 105  CO2 27 29  GLUCOSE 88 88  BUN 21 22  CREATININE 1.01 0.96  CALCIUM 9.4 9.4   Liver Function Tests: Recent Labs    09/27/21 0803 04/04/22 0802  AST 16 20  ALT 14 20  BILITOT 0.5 0.5  PROT 7.1 7.0   No results for input(s): "LIPASE", "AMYLASE" in the last 8760 hours. No results for input(s): "AMMONIA" in the last 8760 hours. CBC: Recent Labs    09/27/21 0803 04/04/22 0802  WBC 7.8 8.1  NEUTROABS 5,756 5,621  HGB 16.5 15.8  HCT 49.3 47.3  MCV 95.4 96.9  PLT 247 213   Lipid Panel: Recent Labs    09/27/21 0803 04/04/22 0802  CHOL 114 124  HDL 38* 44  LDLCALC 53 61  TRIG 149 111  CHOLHDL 3.0 2.8   Lab Results  Component Value Date   HGBA1C 5.7 (H) 04/04/2022    Procedures since last visit: CT Chest Wo Contrast  Result Date: 04/05/2022 CLINICAL DATA:  History of lung cancer with left lower lobectomy. * Tracking Code: BO * EXAM: CT CHEST WITHOUT CONTRAST TECHNIQUE: Multidetector CT imaging of the chest was performed following the standard protocol without IV contrast. RADIATION DOSE REDUCTION: This exam was performed according to the departmental  dose-optimization program which includes automated exposure control, adjustment of the mA and/or kV according to patient size and/or use of iterative reconstruction technique. COMPARISON:  Chest CT 04/11/2021 and 02/08/2021. FINDINGS: Cardiovascular: Atherosclerosis of the aorta, great vessels and coronary arteries. Borderline central dilatation of the pulmonary arteries. No acute vascular findings on noncontrast imaging. The heart size is normal. There is no pericardial effusion. Mediastinum/Nodes: There are no enlarged mediastinal, hilar or axillary lymph nodes.Mild distal esophageal wall thickening appears similar to the previous study. The thyroid gland and trachea appear unremarkable. Lungs/Pleura: No pleural effusion or pneumothorax. Postsurgical changes from left lower lobectomy with chronic pleural thickening posteriorly. Stable mild centrilobular and paraseptal emphysema with linear scarring at both lung bases. No suspicious pulmonary nodularity. Upper abdomen: The visualized upper abdomen appears stable without suspicious findings. Stable cyst in the left hepatic lobe. Musculoskeletal/Chest wall: Numerous sebaceous cysts within the upper right back are unchanged. No suspicious chest wall mass or acute osseous findings. Old left-sided rib fractures or thoracotomy defects. IMPRESSION: 1. Stable postoperative chest status post left lower lobectomy. 2. No evidence of local recurrence or metastatic disease. 3. No residual significant inflammatory changes are identified. 4. Coronary and aortic atherosclerosis (ICD10-I70.0). Emphysema (ICD10-J43.9). Electronically Signed   By: Richardean Sale M.D.   On: 04/05/2022 14:23    Assessment/Plan  1. Trigger finger, unspecified finger, unspecified laterality -  instructed to apply finger splints -  apply heating pad to hand for 20 minutes  TID - predniSONE (DELTASONE) 20 MG tablet; Take 1 tablet (20 mg total) by mouth 2 (two) times daily with a meal for 5 days.   Dispense: 10 tablet; Refill: 0 - predniSONE (DELTASONE) 20 MG tablet; Take 1 tablet (20 mg total) by mouth daily with breakfast for 5 days.  Dispense: 5 tablet; Refill: 0  2. Essential hypertension -Blood pressure well controlled Continue current medications -check BP daily and log  3. Morbidly obese (HCC) Body mass index is 40.1 kg/m. Wt Readings from Last 3 Encounters:  05/03/22 241 lb (109.3 kg)  04/10/22 235 lb (106.6 kg)  04/09/22 235 lb 6.4 oz (106.8 kg)   -  discussed eating plant-based diet and having regular exercise   Labs/tests ordered:   None  Next appt:  10/01/2022

## 2022-05-08 DIAGNOSIS — H9113 Presbycusis, bilateral: Secondary | ICD-10-CM | POA: Diagnosis not present

## 2022-05-15 ENCOUNTER — Telehealth: Payer: Self-pay | Admitting: Pulmonary Disease

## 2022-05-15 NOTE — Telephone Encounter (Signed)
Called  patient and he states he is in the donut hole and is needing some samples of trelegy 100. I advised him that his samples will be upfront. Nothing further needed

## 2022-05-20 ENCOUNTER — Other Ambulatory Visit: Payer: Self-pay | Admitting: Nurse Practitioner

## 2022-05-20 DIAGNOSIS — E782 Mixed hyperlipidemia: Secondary | ICD-10-CM

## 2022-05-27 ENCOUNTER — Other Ambulatory Visit: Payer: Self-pay | Admitting: Nurse Practitioner

## 2022-05-27 DIAGNOSIS — I10 Essential (primary) hypertension: Secondary | ICD-10-CM

## 2022-06-15 ENCOUNTER — Telehealth: Payer: Self-pay | Admitting: Pulmonary Disease

## 2022-06-15 MED ORDER — TRELEGY ELLIPTA 100-62.5-25 MCG/ACT IN AEPB: 1.0000 | INHALATION_SPRAY | Freq: Every day | RESPIRATORY_TRACT | 0 refills | Status: DC

## 2022-06-15 NOTE — Telephone Encounter (Signed)
Called and spoke with patient. He stated that he is in the donut hole and can not afford his Trelegy 162mcg. I advised him that I would leave 2 samples at the front desk for him. He verbalized understanding.   Nothing further needed at time of call.

## 2022-06-28 ENCOUNTER — Other Ambulatory Visit: Payer: Self-pay | Admitting: Nurse Practitioner

## 2022-06-28 DIAGNOSIS — I10 Essential (primary) hypertension: Secondary | ICD-10-CM

## 2022-07-18 ENCOUNTER — Other Ambulatory Visit: Payer: Self-pay

## 2022-07-18 ENCOUNTER — Telehealth: Payer: Self-pay | Admitting: Pulmonary Disease

## 2022-07-18 MED ORDER — TRELEGY ELLIPTA 100-62.5-25 MCG/ACT IN AEPB: 1.0000 | INHALATION_SPRAY | Freq: Every day | RESPIRATORY_TRACT | 6 refills | Status: DC

## 2022-07-18 NOTE — Telephone Encounter (Signed)
Called and spoke with [name] from [facility] and had to give verbal orders for patient to receive 1 inhaler due to them getting audited and are wanting to confirm the 60 each part of the rx. Nothing further needed

## 2022-07-18 NOTE — Telephone Encounter (Signed)
Courtney from [place] needs clarification on Trelegy inhaler. States 60 each. Courtney phone number is [phone].

## 2022-07-18 NOTE — Telephone Encounter (Signed)
Spoke with patient advised refills of trelegy have been sent to pharmacy. Nothing further needed.

## 2022-09-17 DIAGNOSIS — M65341 Trigger finger, right ring finger: Secondary | ICD-10-CM | POA: Diagnosis not present

## 2022-09-21 DIAGNOSIS — H0102A Squamous blepharitis right eye, upper and lower eyelids: Secondary | ICD-10-CM | POA: Diagnosis not present

## 2022-09-21 DIAGNOSIS — H0102B Squamous blepharitis left eye, upper and lower eyelids: Secondary | ICD-10-CM | POA: Diagnosis not present

## 2022-09-21 DIAGNOSIS — H2513 Age-related nuclear cataract, bilateral: Secondary | ICD-10-CM | POA: Diagnosis not present

## 2022-09-27 ENCOUNTER — Other Ambulatory Visit: Payer: Self-pay | Admitting: Nurse Practitioner

## 2022-09-27 DIAGNOSIS — I10 Essential (primary) hypertension: Secondary | ICD-10-CM

## 2022-10-01 ENCOUNTER — Other Ambulatory Visit: Payer: PPO

## 2022-10-01 DIAGNOSIS — E782 Mixed hyperlipidemia: Secondary | ICD-10-CM

## 2022-10-01 DIAGNOSIS — R972 Elevated prostate specific antigen [PSA]: Secondary | ICD-10-CM

## 2022-10-01 DIAGNOSIS — I10 Essential (primary) hypertension: Secondary | ICD-10-CM

## 2022-10-01 DIAGNOSIS — D696 Thrombocytopenia, unspecified: Secondary | ICD-10-CM

## 2022-10-01 DIAGNOSIS — I700 Atherosclerosis of aorta: Secondary | ICD-10-CM

## 2022-10-01 DIAGNOSIS — Z87891 Personal history of nicotine dependence: Secondary | ICD-10-CM | POA: Diagnosis not present

## 2022-10-01 DIAGNOSIS — R739 Hyperglycemia, unspecified: Secondary | ICD-10-CM

## 2022-10-02 LAB — COMPLETE METABOLIC PANEL WITH GFR
AG Ratio: 1.5 (calc) (ref 1.0–2.5)
ALT: 16 U/L (ref 9–46)
AST: 18 U/L (ref 10–35)
Albumin: 4.3 g/dL (ref 3.6–5.1)
Alkaline phosphatase (APISO): 75 U/L (ref 35–144)
BUN/Creatinine Ratio: 26 (calc) — ABNORMAL HIGH (ref 6–22)
BUN: 27 mg/dL — ABNORMAL HIGH (ref 7–25)
CO2: 25 mmol/L (ref 20–32)
Calcium: 9.6 mg/dL (ref 8.6–10.3)
Chloride: 107 mmol/L (ref 98–110)
Creat: 1.05 mg/dL (ref 0.70–1.28)
Globulin: 2.9 g/dL (calc) (ref 1.9–3.7)
Glucose, Bld: 96 mg/dL (ref 65–99)
Potassium: 4.5 mmol/L (ref 3.5–5.3)
Sodium: 142 mmol/L (ref 135–146)
Total Bilirubin: 0.5 mg/dL (ref 0.2–1.2)
Total Protein: 7.2 g/dL (ref 6.1–8.1)
eGFR: 72 mL/min/{1.73_m2} (ref >=60)

## 2022-10-02 LAB — CBC WITH DIFFERENTIAL/PLATELET
Absolute Monocytes: 635 cells/uL (ref 200–950)
Basophils Absolute: 26 cells/uL (ref 0–200)
Basophils Relative: 0.3 %
Eosinophils Absolute: 96 cells/uL (ref 15–500)
Eosinophils Relative: 1.1 %
HCT: 50.6 % — ABNORMAL HIGH (ref 38.5–50.0)
Hemoglobin: 16.7 g/dL (ref 13.2–17.1)
Lymphs Abs: 1557 cells/uL (ref 850–3900)
MCH: 31.9 pg (ref 27.0–33.0)
MCHC: 33 g/dL (ref 32.0–36.0)
MCV: 96.6 fL (ref 80.0–100.0)
MPV: 11 fL (ref 7.5–12.5)
Monocytes Relative: 7.3 %
Neutro Abs: 6386 cells/uL (ref 1500–7800)
Neutrophils Relative %: 73.4 %
Platelets: 221 10*3/uL (ref 140–400)
RBC: 5.24 10*6/uL (ref 4.20–5.80)
RDW: 13.1 % (ref 11.0–15.0)
Total Lymphocyte: 17.9 %
WBC: 8.7 10*3/uL (ref 3.8–10.8)

## 2022-10-02 LAB — PSA: PSA: 3.94 ng/mL (ref <=4.00)

## 2022-10-02 LAB — HEMOGLOBIN A1C
Hgb A1c MFr Bld: 6.2 % of total Hgb — ABNORMAL HIGH (ref <5.7)
Mean Plasma Glucose: 131 mg/dL
eAG (mmol/L): 7.3 mmol/L

## 2022-10-02 LAB — LIPID PANEL
Cholesterol: 131 mg/dL (ref <200)
HDL: 39 mg/dL — ABNORMAL LOW (ref >=40)
LDL Cholesterol (Calc): 66 mg/dL (calc)
Non-HDL Cholesterol (Calc): 92 mg/dL (calc) (ref <130)
Total CHOL/HDL Ratio: 3.4 (calc) (ref <5.0)
Triglycerides: 182 mg/dL — ABNORMAL HIGH (ref <150)

## 2022-10-05 ENCOUNTER — Ambulatory Visit (INDEPENDENT_AMBULATORY_CARE_PROVIDER_SITE_OTHER): Payer: PPO | Admitting: Nurse Practitioner

## 2022-10-05 ENCOUNTER — Encounter: Payer: Self-pay | Admitting: Nurse Practitioner

## 2022-10-05 VITALS — BP 130/70 | HR 73 | Temp 97.5°F | Ht 65.0 in | Wt 238.0 lb

## 2022-10-05 DIAGNOSIS — R600 Localized edema: Secondary | ICD-10-CM | POA: Diagnosis not present

## 2022-10-05 DIAGNOSIS — E782 Mixed hyperlipidemia: Secondary | ICD-10-CM

## 2022-10-05 DIAGNOSIS — R739 Hyperglycemia, unspecified: Secondary | ICD-10-CM | POA: Diagnosis not present

## 2022-10-05 DIAGNOSIS — I10 Essential (primary) hypertension: Secondary | ICD-10-CM | POA: Diagnosis not present

## 2022-10-05 DIAGNOSIS — F17200 Nicotine dependence, unspecified, uncomplicated: Secondary | ICD-10-CM

## 2022-10-05 DIAGNOSIS — I700 Atherosclerosis of aorta: Secondary | ICD-10-CM

## 2022-10-05 DIAGNOSIS — R972 Elevated prostate specific antigen [PSA]: Secondary | ICD-10-CM

## 2022-10-05 NOTE — Progress Notes (Signed)
Careteam: Patient Care Team: [name], NP as PCP - General (Nurse Practitioner) Druscilla Brownie, MD as Consulting Physician (Dermatology) Milus Banister, MD as Attending Physician (Gastroenterology)  PLACE OF SERVICE:  [place] Directive information Does Patient Have a Medical Advance Directive?: Yes, Type of Advance Directive: Out of facility DNR (pink MOST or yellow form), Pre-existing out of facility DNR order (yellow form or pink MOST form): Pink MOST form placed in chart (order not valid for inpatient use);Yellow form placed in chart (order not valid for inpatient use), Does patient want to make changes to medical advance directive?: No - Patient declined  Allergies  Allergen Reactions   Asa [Aspirin] Anaphylaxis and Swelling    Angioedema/Eyes and lip swelling   Naproxen Hives   Nsaids Hives   Tolmetin Hives    Chief Complaint  Patient presents with   Medical Management of Chronic Issues    6 month follow-up. Discuss need for pneumonia vaccine and covid boosters. [facility] verified. Patient denies receiving any vaccines since last visit.       HPI: Patient is a 82 y.o. male for routine follow up.   Reports he is not sure about why A1c so up- eats fish and baked potato  Does not like sweets.  Eats plain cheerios for breakfast. Eats chicken salad sandwich for lunch will limit chips.   Quit smoking and now has restarted.   No chest pains, worsening shortness of breath Occasional lower leg swelling- using lasix PRN if needed.   Review of Systems:  Review of Systems  Constitutional:  Negative for chills, fever and weight loss.  HENT:  Negative for tinnitus.   Respiratory:  Negative for cough, sputum production and shortness of breath.   Cardiovascular:  Negative for chest pain, palpitations and leg swelling.  Gastrointestinal:  Negative for abdominal pain, constipation, diarrhea and heartburn.  Genitourinary:  Negative for dysuria, frequency  and urgency.  Musculoskeletal:  Negative for back pain, falls, joint pain and myalgias.  Skin: Negative.   Neurological:  Negative for dizziness and headaches.  Psychiatric/Behavioral:  Negative for depression and memory loss. The patient does not have insomnia.     Past Medical History:  Diagnosis Date   A-fib [facility])    Acute appendicitis with peritoneal abscess 01/28/2013   Allergy    seasonal    Anxiety    Arthritis    Bacteremia September 2015   Elevated PSA    Essential hypertension 04/16/2014   Heart murmur    childhood   Ileus, postoperative ([place]) 01/28/2013   Lung cancer ([place]) 2016   Obesity, morbid ([place]) 01/28/2013   Shortness of breath    Tobacco use disorder 01/28/2013   Past Surgical History:  Procedure Laterality Date   APPENDECTOMY     BACK SURGERY  1997, 2003   Dr.Apleton    CARPAL TUNNEL RELEASE     COLONOSCOPY     x 3   EYE SURGERY Left    muscle of the eye    KNEE SURGERY Bilateral    LAPAROSCOPIC APPENDECTOMY N/A 01/25/2013   Procedure: APPENDECTOMY LAPAROSCOPIC;  Surgeon: Ralene Ok, MD;  Location: [place];  Service: General;  Laterality: N/A;   LYMPH NODE DISSECTION Left 04/26/2015   Procedure: LYMPH NODE DISSECTION;  Surgeon: Grace Isaac, MD;  Location: [place];  Service: Thoracic;  Laterality: Left;   POLYPECTOMY     SHOULDER SURGERY Right 2014   Dr.Whitfield rotator cuff surgery   SKIN SURGERY  Right 01/2019   Right Leg, cancerous area removed   SKIN SURGERY  01/01/2022   Right hand (wrist and between thumb and forefinger). [facility]   TEE WITHOUT CARDIOVERSION N/A 04/07/2014   Procedure: TRANSESOPHAGEAL ECHOCARDIOGRAM (TEE);  Surgeon: Candee Furbish, MD;  Location: [facility] ENDOSCOPY;  Service: Cardiovascular;  Laterality: N/A;   VIDEO ASSISTED THORACOSCOPY (VATS)/WEDGE RESECTION Left 04/26/2015   Procedure: LEFT VIDEO ASSISTED THORACOSCOPY (VATS) WITH LEFT LOWER LOBECTOMY;  Surgeon: Grace Isaac, MD;  Location: [facility];  Service:  Thoracic;  Laterality: Left;   VIDEO BRONCHOSCOPY N/A 04/26/2015   Procedure: VIDEO BRONCHOSCOPY;  Surgeon: Grace Isaac, MD;  Location: [facility];  Service: Thoracic;  Laterality: N/A;   Social History:   reports that he quit smoking about 19 months ago. His smoking use included cigarettes and cigars. He started smoking about 64 years ago. He has a 30.00 pack-year smoking history. He quit smokeless tobacco use about 19 years ago.  His smokeless tobacco use included chew. He reports that he does not drink alcohol and does not use drugs.  Family History  Problem Relation Age of Onset   Irritable bowel syndrome Mother    Cancer Neg Hx    Lung disease Neg Hx    Colon cancer Neg Hx    Colon polyps Neg Hx    Esophageal cancer Neg Hx    Rectal cancer Neg Hx    Stomach cancer Neg Hx     Medications: Patient's Medications  New Prescriptions   No medications on file  Previous Medications   ATORVASTATIN (LIPITOR) 10 MG TABLET    TAKE 1 TABLET(10 MG) BY MOUTH DAILY   FLUTICASONE-UMECLIDIN-VILANT (TRELEGY ELLIPTA) 100-62.5-25 MCG/ACT AEPB    Inhale 1 puff into the lungs daily.   HYDRALAZINE (APRESOLINE) 25 MG TABLET    TAKE 1 TABLET BY MOUTH THREE TIMES DAILY FOR BLOOD PRESSURE   LOSARTAN (COZAAR) 50 MG TABLET    TAKE 1 TABLET BY MOUTH EVERY DAY FOR BLOOD PRESSURE   METOPROLOL TARTRATE (LOPRESSOR) 25 MG TABLET    TAKE 1/2 TABLET BY MOUTH TWICE DAILY  Modified Medications   No medications on file  Discontinued Medications   FLUTICASONE-UMECLIDIN-VILANT (TRELEGY ELLIPTA) 100-62.5-25 MCG/ACT AEPB    Inhale 1 puff into the lungs daily.    Physical Exam:  Vitals:   10/05/22 0850  BP: 130/70  Pulse: 73  Temp: (!) 97.5 F (36.4 C)  TempSrc: Temporal  SpO2: 95%  Weight: 238 lb (108 kg)  Height: 5\' 5"  (1.651 m)   Body mass index is 39.61 kg/m. Wt Readings from Last 3 Encounters:  10/05/22 238 lb (108 kg)  05/03/22 241 lb (109.3 kg)  04/10/22 235 lb (106.6 kg)    Physical  Exam Constitutional:      General: He is not in acute distress.    Appearance: He is well-developed. He is not diaphoretic.  HENT:     Head: Normocephalic and atraumatic.     Right Ear: External ear normal.     Left Ear: External ear normal.     Mouth/Throat:     Pharynx: No oropharyngeal exudate.  Eyes:     Conjunctiva/sclera: Conjunctivae normal.     Pupils: Pupils are equal, round, and reactive to light.  Cardiovascular:     Rate and Rhythm: Normal rate and regular rhythm.     Heart sounds: Normal heart sounds.  Pulmonary:     Effort: Pulmonary effort is normal.     Comments: Diminished.  Abdominal:  General: Bowel sounds are normal.     Palpations: Abdomen is soft.  Musculoskeletal:        General: No tenderness.     Cervical back: Normal range of motion and neck supple.     Right lower leg: Edema (trace) present.     Left lower leg: Edema (trace) present.  Skin:    General: Skin is warm and dry.  Neurological:     Mental Status: He is alert and oriented to person, place, and time.     Labs reviewed: Basic Metabolic Panel: Recent Labs    04/04/22 0802 10/01/22 0803  NA 140 142  K 4.8 4.5  CL 105 107  CO2 29 25  GLUCOSE 88 96  BUN 22 27*  CREATININE 0.96 1.05  CALCIUM 9.4 9.6   Liver Function Tests: Recent Labs    04/04/22 0802 10/01/22 0803  AST 20 18  ALT 20 16  BILITOT 0.5 0.5  PROT 7.0 7.2   No results for input(s): "LIPASE", "AMYLASE" in the last 8760 hours. No results for input(s): "AMMONIA" in the last 8760 hours. CBC: Recent Labs    04/04/22 0802 10/01/22 0803  WBC 8.1 8.7  NEUTROABS 5,621 6,386  HGB 15.8 16.7  HCT 47.3 50.6*  MCV 96.9 96.6  PLT 213 221   Lipid Panel: Recent Labs    04/04/22 0802 10/01/22 0803  CHOL 124 131  HDL 44 39*  LDLCALC 61 66  TRIG 111 182*  CHOLHDL 2.8 3.4   TSH: No results for input(s): "TSH" in the last 8760 hours. A1C: Lab Results  Component Value Date   HGBA1C 6.2 (H) 10/01/2022      Assessment/Plan 1. Hyperglycemia -plans to work on diet.  - Hemoglobin A1c; Future  2. Elevated PSA -stable at this time  3. Essential hypertension -Blood pressure well controlled, goal bp <140/90 Continue current medications and dietary modifications follow metabolic panel - COMPLETE METABOLIC PANEL WITH GFR; Future - CBC with Differential/Platelet; Future  4. Obesity, morbid ([place]) -encouraged to liberalize diet. To have protein supplement in addition to smallest meal of the day. -discussed weight and wellness consult or nutritionist for more information but he has declined at this time.   5. Aortic atherosclerosis ([facility]) -allergic to asa -continues on statin  6. Tobacco use disorder Cessation encouraged.   7. Mixed hyperlipidemia Continues on lipitor- dietary modifications encouraged.  - Lipid panel; Future - COMPLETE METABOLIC PANEL WITH GFR; Future  8. Bilateral leg edema --encouraged to elevate legs above level of heart as tolerates, low sodium diet, compression hose as tolerates (on in am, off in pm)  Return in about 6 months (around 04/07/2023) for routine follow up, labs prior .  Carlos American. [place], [facility] [phone]

## 2022-10-05 NOTE — Patient Instructions (Addendum)
Be aware of carbohydrates- this will drive your sugar and weight up.   Sodium is salt  Carbohydrates and sugar effect your A1c/glucose

## 2022-11-21 DIAGNOSIS — L720 Epidermal cyst: Secondary | ICD-10-CM | POA: Diagnosis not present

## 2022-11-21 DIAGNOSIS — Z85828 Personal history of other malignant neoplasm of skin: Secondary | ICD-10-CM | POA: Diagnosis not present

## 2022-11-21 DIAGNOSIS — L603 Nail dystrophy: Secondary | ICD-10-CM | POA: Diagnosis not present

## 2022-11-21 DIAGNOSIS — D485 Neoplasm of uncertain behavior of skin: Secondary | ICD-10-CM | POA: Diagnosis not present

## 2022-11-21 DIAGNOSIS — D225 Melanocytic nevi of trunk: Secondary | ICD-10-CM | POA: Diagnosis not present

## 2022-11-21 DIAGNOSIS — I872 Venous insufficiency (chronic) (peripheral): Secondary | ICD-10-CM | POA: Diagnosis not present

## 2022-11-21 DIAGNOSIS — L821 Other seborrheic keratosis: Secondary | ICD-10-CM | POA: Diagnosis not present

## 2022-11-21 DIAGNOSIS — L814 Other melanin hyperpigmentation: Secondary | ICD-10-CM | POA: Diagnosis not present

## 2022-11-21 DIAGNOSIS — Z08 Encounter for follow-up examination after completed treatment for malignant neoplasm: Secondary | ICD-10-CM | POA: Diagnosis not present

## 2022-12-01 ENCOUNTER — Other Ambulatory Visit: Payer: Self-pay | Admitting: Nurse Practitioner

## 2022-12-01 DIAGNOSIS — I10 Essential (primary) hypertension: Secondary | ICD-10-CM

## 2023-01-02 ENCOUNTER — Other Ambulatory Visit: Payer: Self-pay

## 2023-01-02 DIAGNOSIS — I10 Essential (primary) hypertension: Secondary | ICD-10-CM

## 2023-01-02 MED ORDER — LOSARTAN POTASSIUM 50 MG PO TABS
50.0000 mg | ORAL_TABLET | Freq: Every day | ORAL | 3 refills | Status: DC
Start: 2023-01-02 — End: 2023-12-17

## 2023-01-02 MED ORDER — HYDRALAZINE HCL 25 MG PO TABS
25.0000 mg | ORAL_TABLET | Freq: Three times a day (TID) | ORAL | 3 refills | Status: DC
Start: 2023-01-02 — End: 2023-12-17

## 2023-01-14 ENCOUNTER — Encounter: Payer: Self-pay | Admitting: Nurse Practitioner

## 2023-01-14 ENCOUNTER — Ambulatory Visit (INDEPENDENT_AMBULATORY_CARE_PROVIDER_SITE_OTHER): Payer: PPO | Admitting: Nurse Practitioner

## 2023-01-14 VITALS — BP 124/62 | HR 69 | Temp 97.5°F | Ht 66.0 in | Wt 236.0 lb

## 2023-01-14 DIAGNOSIS — Z0000 Encounter for general adult medical examination without abnormal findings: Secondary | ICD-10-CM

## 2023-01-14 NOTE — Progress Notes (Signed)
Subjective:   [name] is a 82 y.o. male who presents for Medicare Annual/Subsequent preventive examination.  Visit Complete: In person  Patient Medicare AWV questionnaire was completed by the patient on 01/14/23; I have confirmed that all information answered by patient is correct and no changes since this date.  Review of Systems     Cardiac Risk Factors include: advanced age (>81men, >71 women);family history of premature cardiovascular disease;sedentary lifestyle;smoking/ tobacco exposure;hypertension;dyslipidemia;male gender;obesity (BMI >30kg/m2)     Objective:    Today's Vitals   01/14/23 1010 01/14/23 1034  BP: 124/62   Pulse: 69   Temp: (!) 97.5 F (36.4 C)   TempSrc: Temporal   SpO2: 95%   Weight: 236 lb (107 kg)   Height: 5\' 6"  (1.676 m)   PainSc: 4  4    Body mass index is 38.09 kg/m.  Wt Readings from Last 3 Encounters:  01/14/23 236 lb (107 kg)  10/05/22 238 lb (108 kg)  05/03/22 241 lb (109.3 kg)        01/14/2023   10:15 AM 10/05/2022    8:49 AM 05/03/2022    8:45 AM 04/06/2022   10:01 AM 01/09/2022    9:29 AM 10/02/2021    9:48 AM 04/03/2021    9:59 AM  Advanced Directives  Does Patient Have a Medical Advance Directive? Yes Yes Yes No Yes Yes Yes  Type of Advance Directive Out of facility DNR (pink MOST or yellow form) Out of facility DNR (pink MOST or yellow form) Out of facility DNR (pink MOST or yellow form)  Out of facility DNR (pink MOST or yellow form) Out of facility DNR (pink MOST or yellow form) Out of facility DNR (pink MOST or yellow form)  Does patient want to make changes to medical advance directive? No - Patient declined No - Patient declined No - Patient declined  No - Patient declined No - Patient declined No - Patient declined  Would patient like information on creating a medical advance directive?   No - Patient declined No - Patient declined     Pre-existing out of facility DNR order (yellow form or pink MOST form) Yellow form  placed in chart (order not valid for inpatient use) Pink MOST form placed in chart (order not valid for inpatient use);Yellow form placed in chart (order not valid for inpatient use)   Yellow form placed in chart (order not valid for inpatient use);Pink MOST form placed in chart (order not valid for inpatient use) Pink MOST form placed in chart (order not valid for inpatient use);Yellow form placed in chart (order not valid for inpatient use) Yellow form placed in chart (order not valid for inpatient use);Pink MOST form placed in chart (order not valid for inpatient use)    Current Medications (verified) Outpatient Encounter Medications as of 01/14/2023  Medication Sig   atorvastatin (LIPITOR) 10 MG tablet TAKE 1 TABLET(10 MG) BY MOUTH DAILY   Fluticasone-Umeclidin-Vilant (TRELEGY ELLIPTA) 100-62.5-25 MCG/ACT AEPB Inhale 1 puff into the lungs daily.   hydrALAZINE (APRESOLINE) 25 MG tablet Take 1 tablet (25 mg total) by mouth 3 (three) times daily.   losartan (COZAAR) 50 MG tablet Take 1 tablet (50 mg total) by mouth daily.   metoprolol tartrate (LOPRESSOR) 25 MG tablet Take 1/2 (one-half) tablet by mouth twice daily   No facility-administered encounter medications on file as of 01/14/2023.    Allergies (verified) Asa [aspirin], Naproxen, Nsaids, and Tolmetin   History: Past Medical History:  Diagnosis Date  A-fib [facility])    Acute appendicitis with peritoneal abscess 01/28/2013   Allergy    seasonal    Anxiety    Arthritis    Bacteremia September 2015   Elevated PSA    Essential hypertension 04/16/2014   Heart murmur    childhood   Ileus, postoperative (HCC) 01/28/2013   Lung cancer (HCC) 2016   Obesity, morbid (HCC) 01/28/2013   Shortness of breath    Tobacco use disorder 01/28/2013   Past Surgical History:  Procedure Laterality Date   APPENDECTOMY     BACK SURGERY  1997, 2003   Dr.Apleton    CARPAL TUNNEL RELEASE     COLONOSCOPY     x 3   EYE SURGERY Left    muscle of the eye     KNEE SURGERY Bilateral    LAPAROSCOPIC APPENDECTOMY N/A 01/25/2013   Procedure: APPENDECTOMY LAPAROSCOPIC;  Surgeon: [name], MD;  Location: [facility] OR;  Service: General;  Laterality: N/A;   LYMPH NODE DISSECTION Left 04/26/2015   Procedure: LYMPH NODE DISSECTION;  Surgeon: Delight Ovens, MD;  Location: [facility] OR;  Service: Thoracic;  Laterality: Left;   POLYPECTOMY     SHOULDER SURGERY Right 2014   Dr.Whitfield rotator cuff surgery   SKIN SURGERY Right 01/2019   Right Leg, cancerous area removed   SKIN SURGERY  01/01/2022   Right hand (wrist and between thumb and forefinger). [facility]   TEE WITHOUT CARDIOVERSION N/A 04/07/2014   Procedure: TRANSESOPHAGEAL ECHOCARDIOGRAM (TEE);  Surgeon: Donato Schultz, MD;  Location: [facility] ENDOSCOPY;  Service: Cardiovascular;  Laterality: N/A;   VIDEO ASSISTED THORACOSCOPY (VATS)/WEDGE RESECTION Left 04/26/2015   Procedure: LEFT VIDEO ASSISTED THORACOSCOPY (VATS) WITH LEFT LOWER LOBECTOMY;  Surgeon: Delight Ovens, MD;  Location: [facility] OR;  Service: Thoracic;  Laterality: Left;   VIDEO BRONCHOSCOPY N/A 04/26/2015   Procedure: VIDEO BRONCHOSCOPY;  Surgeon: Delight Ovens, MD;  Location: [facility] OR;  Service: Thoracic;  Laterality: N/A;   Family History  Problem Relation Age of Onset   Irritable bowel syndrome Mother    Cancer Neg Hx    Lung disease Neg Hx    Colon cancer Neg Hx    Colon polyps Neg Hx    Esophageal cancer Neg Hx    Rectal cancer Neg Hx    Stomach cancer Neg Hx    Social History   Socioeconomic History   Marital status: Divorced    Spouse name: Not on file   Number of children: 0   Years of education: Not on file   Highest education level: Not on file  Occupational History   Occupation: Retired  Tobacco Use   Smoking status: Every Day    Packs/day: 0.50    Years: 60.00    Additional pack years: 0.00    Total pack years: 30.00    Types: Cigarettes, Cigars    Start date: 07/16/1958   Smokeless tobacco: Former     Types: Chew    Quit date: 07/17/2003   Tobacco comments:    Quit smoking cigarettes about age 89  Vaping Use   Vaping Use: Former   Quit date: 07/16/2008  Substance and Sexual Activity   Alcohol use: No    Alcohol/week: 0.0 standard drinks of alcohol   Drug use: No   Sexual activity: Not Currently  Other Topics Concern   Not on file  Social History Narrative   No diet   Yes, eats/drinks things with caffeine    Divorced, married 1979  Lives in a house, one stories , one person, no pets   Current/past profession- [employer]   Patient exercises, golf 3 days weekly        Originally from [place]. Previously lived in [place] for [place] years and moved back to [place] in 1968. No international travel. He has traveled through multiple states traveling to [place]. 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 Unsure if he was exposed to any asbestos. No mold exposure. No pets currently. Previously has owned dogs. Parakeet as a child. No hot tub exposure. Enjoys golfing.   Social Determinants of Health   Financial Resource Strain: Low Risk  (12/18/2017)   Overall Financial Resource Strain (CARDIA)    Difficulty of Paying Living Expenses: Not hard at all  Food Insecurity: No Food Insecurity (12/18/2017)   Hunger Vital Sign    Worried About Running Out of Food in the Last Year: Never true    Ran Out of Food in the Last Year: Never true  Transportation Needs: No Transportation Needs (12/18/2017)   PRAPARE - Administrator, Civil Service (Medical): No    Lack of Transportation (Non-Medical): No  Physical Activity: Inactive (12/18/2017)   Exercise Vital Sign    Days of Exercise per Week: 0 days    Minutes of Exercise per Session: 0 min  Stress: No Stress Concern Present (12/18/2017)   [employer] of Occupational Health - Occupational Stress Questionnaire    Feeling of Stress : Only a little  Social Connections: Somewhat Isolated  (12/18/2017)   Social Connection and Isolation Panel [NHANES]    Frequency of Communication with Friends and Family: More than three times a week    Frequency of Social Gatherings with Friends and Family: More than three times a week    Attends Religious Services: More than 4 times per year    Active Member of [employer] or Organizations: No    Attends Engineer, structural: Never    Marital Status: Divorced    Tobacco Counseling Ready to quit: Not Answered Counseling given: Not Answered Tobacco comments: Quit smoking cigarettes about age 78   Clinical Intake:  Pre-visit preparation completed: Yes  Pain Score: 4      BMI - recorded: 236 Nutritional Status: BMI > 30  Obese Diabetes: No  How often do you need to have someone help you when you read instructions, pamphlets, or other written materials from your doctor or pharmacy?: 1 - Never         Activities of Daily Living    01/14/2023   10:32 AM  In your present state of health, do you have any difficulty performing the following activities:  Hearing? 1  Vision? 0  Difficulty concentrating or making decisions? 0  Walking or climbing stairs? 0  Dressing or bathing? 0  Doing errands, shopping? 0  Preparing Food and eating ? N  Using the Toilet? N  In the past six months, have you accidently leaked urine? Y  Do you have problems with loss of bowel control? N  Managing your Medications? N  Managing your Finances? N  Housekeeping or managing your Housekeeping? N    Patient Care Team: Sharon Seller, NP as PCP - General (Nurse Practitioner) Cherlyn Roberts, MD as Consulting Physician (Dermatology) Rachael Fee, MD as Attending Physician (Gastroenterology)  Indicate any recent Medical Services you may have received from other than [facility] providers in the past year (date  may be approximate).     Assessment:   This is a routine wellness examination for [facility].  Hearing/Vision screen Hearing Screening -  Comments:: Patient admits to hearing loss, wears hearing aids (not present today)  Vision Screening - Comments:: Last eye exam less than 12 months ago with Dr. Dione Booze  Dietary issues and exercise activities discussed:     Goals Addressed             This Visit's Progress    Quit smoking / using tobacco         Depression Screen    01/14/2023   10:12 AM 10/05/2022    9:42 AM 01/09/2022    9:27 AM 10/02/2021   10:02 AM 01/03/2021    8:26 AM 09/05/2020    9:44 AM 01/11/2020    8:30 AM  PHQ 2/9 Scores  PHQ - 2 Score 0 0 0 0 0 0 0    Fall Risk    01/14/2023   10:12 AM 10/05/2022    9:42 AM 05/03/2022    8:45 AM 04/06/2022   10:01 AM 01/09/2022    9:26 AM  Fall Risk   Falls in the past year? 0 0 0 0 0  Number falls in past yr: 0 0 0 0 0  Injury with Fall? 0 0 0 0 0  Risk for fall due to : No Fall Risks No Fall Risks No Fall Risks No Fall Risks No Fall Risks  Follow up Falls evaluation completed Falls evaluation completed Falls evaluation completed Falls evaluation completed Falls evaluation completed    MEDICARE RISK AT HOME:   TIMED UP AND GO:  Was the test performed?  No    Cognitive Function:    01/14/2023   10:18 AM 12/23/2018    9:07 AM 11/23/2016    9:09 AM 02/14/2016   10:00 AM 07/29/2014    9:24 AM  MMSE - Mini Mental State Exam  Orientation to time 5 5 5 5 5   Orientation to Place 5 5 5 5 5   Registration 3 3 3 3 3   Attention/ Calculation 5 5 4 5 5   Recall 3 3 2 3 3   Language- name 2 objects 2 2 2 2 2   Language- repeat 1 1 1 1 1   Language- follow 3 step command 3 3 3 3 3   Language- read & follow direction 0 1 1 1 1   Language-read & follow direction-comments Did not read out loud      Write a sentence 1 1 1  0 1  Copy design 1 1 1 1 1   Total score 29 30 28 29 30         01/09/2022    9:29 AM 01/03/2021    8:28 AM 12/29/2019    8:35 AM  6CIT Screen  What Year? 0 points 0 points 0 points  What month? 0 points 0 points 0 points  What time? 0 points 0 points 0  points  Count back from 20 0 points 0 points 0 points  Months in reverse 0 points 2 points 2 points  Repeat phrase 0 points 0 points 6 points  Total Score 0 points 2 points 8 points    Immunizations Immunization History  Administered Date(s) Administered   Fluad Quad(high Dose 65+) 04/03/2021, 04/04/2022   Influenza, High Dose Seasonal PF 03/25/2017, 03/25/2018   Influenza,inj,quad, With Preservative 03/31/2016   Influenza-Unspecified 04/29/2014, 03/02/2019, 03/08/2020   PFIZER(Purple Top)SARS-COV-2 Vaccination 08/07/2019, 08/28/2019, 04/21/2020   PNEUMOCOCCAL CONJUGATE-20 01/02/2023  Pneumococcal Conjugate-13 02/14/2016   Pneumococcal-Unspecified 04/29/2014   Tdap 01/13/2021   Zoster Recombinant(Shingrix) 12/18/2017, 03/01/2018    TDAP status: Up to date  Flu Vaccine status: Up to date  Pneumococcal vaccine status: Up to date  Covid-19 vaccine status: Information provided on how to obtain vaccines.   Qualifies for Shingles Vaccine? Yes   Zostavax completed No   Shingrix Completed?: Yes  Screening Tests Health Maintenance  Topic Date Due   COVID-19 Vaccine (4 - 2023-24 season) 03/16/2022   INFLUENZA VACCINE  02/14/2023   Medicare Annual Wellness (AWV)  01/14/2024   Colonoscopy  10/17/2026   DTaP/Tdap/Td (2 - Td or Tdap) 01/14/2031   Pneumonia Vaccine 56+ Years old  Completed   Hepatitis C Screening  Completed   Zoster Vaccines- Shingrix  Completed   HPV VACCINES  Aged Out    Health Maintenance  Health Maintenance Due  Topic Date Due   COVID-19 Vaccine (4 - 2023-24 season) 03/16/2022    Colorectal cancer screening: No longer required.   Lung Cancer Screening: (Low Dose CT Chest recommended if Age 24-80 years, 20 pack-year currently smoking OR have quit w/in 15years.) does qualify.   Lung Cancer Screening Referral: done  Additional Screening:  Hepatitis C Screening: does qualify; Completed 2015  Vision Screening: Recommended annual ophthalmology exams  for early detection of glaucoma and other disorders of the eye. Is the patient up to date with their annual eye exam?  Yes  Who is the provider or what is the name of the office in which the patient attends annual eye exams? [facility] If pt is not established with a provider, would they like to be referred to a provider to establish care? No .   Dental Screening: Recommended annual dental exams for proper oral hygiene  Community Resource Referral / Chronic Care Management: CRR required this visit?  No   CCM required this visit?  No     Plan:     I have personally reviewed and noted the following in the patient's chart:   Medical and social history Use of alcohol, tobacco or illicit drugs  Current medications and supplements including opioid prescriptions. Patient is not currently taking opioid prescriptions. Functional ability and status Nutritional status Physical activity Advanced directives List of other physicians Hospitalizations, surgeries, and ER visits in previous 12 months Vitals Screenings to include cognitive, depression, and falls Referrals and appointments  In addition, I have reviewed and discussed with patient certain preventive protocols, quality metrics, and best practice recommendations. A written personalized care plan for preventive services as well as general preventive health recommendations were provided to patient.     Sharon Seller, NP   01/14/2023   Place of service: [facility]

## 2023-01-14 NOTE — Patient Instructions (Signed)
  Mr. [name] , Thank you for taking time to come for your Medicare Wellness Visit. I appreciate your ongoing commitment to your health goals. Please review the following plan we discussed and let me know if I can assist you in the future.   These are the goals we discussed:  Goals       Increase physical activity      Quit smoking / using tobacco      Reduce portion size      Starting 11/23/2016 I will reduce portion size and increase water intake to 3-4 cups of water a day.      Weight (lb) < 200 lb (90.7 kg) (pt-stated)      Through diet modifications.         This is a list of the screening recommended for you and due dates:  Health Maintenance  Topic Date Due   COVID-19 Vaccine (4 - 2023-24 season) 03/16/2022   Flu Shot  02/14/2023   Medicare Annual Wellness Visit  01/14/2024   Colon Cancer Screening  10/17/2026   DTaP/Tdap/Td vaccine (2 - Td or Tdap) 01/14/2031   Pneumonia Vaccine  Completed   Hepatitis C Screening  Completed   Zoster (Shingles) Vaccine  Completed   HPV Vaccine  Aged Out

## 2023-01-16 ENCOUNTER — Telehealth: Payer: Self-pay | Admitting: Pulmonary Disease

## 2023-01-16 NOTE — Telephone Encounter (Signed)
Pt. Calling needing more refills on Fluticasone-Umeclidin-Vilant (TRELEGY ELLIPTA)  he is about to be out

## 2023-01-18 ENCOUNTER — Other Ambulatory Visit: Payer: Self-pay

## 2023-01-18 MED ORDER — TRELEGY ELLIPTA 100-62.5-25 MCG/ACT IN AEPB: 1.0000 | INHALATION_SPRAY | Freq: Every day | RESPIRATORY_TRACT | 1 refills | Status: DC

## 2023-01-18 NOTE — Telephone Encounter (Signed)
Spoke with patient. Advised Trelegy inhaler has been sent to pharmacy for a 90 day supply as requested. Closing encounter as nothing is further needed

## 2023-01-18 NOTE — Telephone Encounter (Signed)
Pt states med is cheaper when he can fill it in 3 mo increments. Is asking for rx to be sent in that way. He states it is too expensive to pick up 1 month at a time.Is asking for a call back

## 2023-01-18 NOTE — Telephone Encounter (Signed)
Spoke with patient. Advised Trelegy has been sent to pharmacy for a 90 day supply as requested. Closing encounter. NFN

## 2023-01-19 ENCOUNTER — Ambulatory Visit (HOSPITAL_COMMUNITY)
Admission: EM | Admit: 2023-01-19 | Discharge: 2023-01-19 | Disposition: A | Discharge status: Home / Self Care | Payer: PPO | Attending: Emergency Medicine | Admitting: Emergency Medicine

## 2023-01-19 ENCOUNTER — Encounter (HOSPITAL_COMMUNITY): Payer: Self-pay

## 2023-01-19 DIAGNOSIS — L03115 Cellulitis of right lower limb: Secondary | ICD-10-CM

## 2023-01-19 DIAGNOSIS — T24209A Burn of second degree of unspecified site of unspecified lower limb, except ankle and foot, initial encounter: Secondary | ICD-10-CM

## 2023-01-19 MED ORDER — CEPHALEXIN 500 MG PO CAPS: 500.0000 mg | ORAL_CAPSULE | Freq: Three times a day (TID) | ORAL | 0 refills | Status: AC

## 2023-01-19 MED ORDER — SILVER SULFADIAZINE 1 % EX CREA: 1.0000 | TOPICAL_CREAM | Freq: Every day | CUTANEOUS | 0 refills | Status: DC

## 2023-01-19 MED ORDER — SILVER SULFADIAZINE 1 % EX CREA
TOPICAL_CREAM | CUTANEOUS | Status: AC
  Filled 2023-01-19: qty 85

## 2023-01-19 MED ORDER — CHLORHEXIDINE GLUCONATE 4 % EX SOLN: Freq: Every day | CUTANEOUS | 0 refills | Status: DC | PRN

## 2023-01-19 MED ORDER — SILVER SULFADIAZINE 1 % EX CREA: TOPICAL_CREAM | Freq: Every day | CUTANEOUS | Status: DC
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
## 2023-01-19 NOTE — Discharge Instructions (Addendum)
We have dressed your wound today in clinic.  Please clean your burn with warm water and the Hibiclens solution twice daily.  You can apply the Silvadene cream once daily.  Please keep your wound covered if you are going outside or in public with a dressing.  I am placing you on antibiotics because your wound appears to be infected.  Please take all antibiotics as prescribed and until finished.  You can take them with food to prevent gastrointestinal upset.  This antibiotic makes you more prone to sunburn, please ensure you are using adequate sun protection of outside for prolonged periods.  Return to clinic or follow-up with your primary care in the next 5 to 7 days for recheck of your wound.  Please come back sooner if you develop fever, worsening of pain, spreading of redness or tightness, or any new concerning symptoms that warrant sooner evaluation.
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
## 2023-01-25 ENCOUNTER — Ambulatory Visit (HOSPITAL_COMMUNITY)
Admission: EM | Admit: 2023-01-25 | Discharge: 2023-01-25 | Disposition: A | Discharge status: Home / Self Care | Payer: PPO

## 2023-01-25 ENCOUNTER — Encounter (HOSPITAL_COMMUNITY): Payer: Self-pay

## 2023-01-25 DIAGNOSIS — T24209A Burn of second degree of unspecified site of unspecified lower limb, except ankle and foot, initial encounter: Secondary | ICD-10-CM

## 2023-01-25 NOTE — Discharge Instructions (Addendum)
Please continue to use the Silvadene and antibacterial ointment on your wound.  Continue to at least twice daily with warm water and the antibacterial solution, Hibiclens.  Continue to elevate your right leg as well.  You can return to clinic in the next 7 to 10 days for wound recheck.  Please return sooner if you develop worsening pain, fever, streaking, or increased swelling of the wound, as this may indicate infection.  You can also consider following up with our wound care center.
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
## 2023-02-05 ENCOUNTER — Encounter (HOSPITAL_BASED_OUTPATIENT_CLINIC_OR_DEPARTMENT_OTHER): Payer: PPO | Attending: Internal Medicine | Admitting: Internal Medicine

## 2023-02-05 DIAGNOSIS — M069 Rheumatoid arthritis, unspecified: Secondary | ICD-10-CM | POA: Diagnosis not present

## 2023-02-05 DIAGNOSIS — L97812 Non-pressure chronic ulcer of other part of right lower leg with fat layer exposed: Secondary | ICD-10-CM | POA: Diagnosis not present

## 2023-02-05 DIAGNOSIS — F17200 Nicotine dependence, unspecified, uncomplicated: Secondary | ICD-10-CM | POA: Diagnosis not present

## 2023-02-05 DIAGNOSIS — X58XXXA Exposure to other specified factors, initial encounter: Secondary | ICD-10-CM | POA: Insufficient documentation

## 2023-02-05 DIAGNOSIS — T24231A Burn of second degree of right lower leg, initial encounter: Secondary | ICD-10-CM | POA: Insufficient documentation

## 2023-02-05 DIAGNOSIS — Z85118 Personal history of other malignant neoplasm of bronchus and lung: Secondary | ICD-10-CM | POA: Insufficient documentation
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
## 2023-02-11 ENCOUNTER — Telehealth: Payer: Self-pay | Admitting: Pulmonary Disease

## 2023-02-11 ENCOUNTER — Other Ambulatory Visit: Payer: Self-pay | Admitting: Pulmonary Disease

## 2023-02-12 ENCOUNTER — Encounter (HOSPITAL_BASED_OUTPATIENT_CLINIC_OR_DEPARTMENT_OTHER): Payer: PPO | Admitting: Internal Medicine

## 2023-02-12 DIAGNOSIS — L97812 Non-pressure chronic ulcer of other part of right lower leg with fat layer exposed: Secondary | ICD-10-CM

## 2023-02-12 DIAGNOSIS — T24231A Burn of second degree of right lower leg, initial encounter: Secondary | ICD-10-CM | POA: Diagnosis not present

## 2023-02-13 NOTE — Telephone Encounter (Signed)
Spoke with patient. Advised Trelegy has been sent to pharmacy. NFN
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
## 2023-02-13 NOTE — Progress Notes (Signed)
Kurt, [name] ([id number]) [id number] Page 1 of 7 Visit Report for 02/12/2023 Arrival Information Details Patient Name: Date of Service: [name] [place] 02/12/2023 2:30 PM Medical Record Number: [id number] Patient Account Number: [id number] Date of Birth/Sex: Treating RN: 26-Aug-1940 (82 y.o. M) Primary Care Ita Fritzsche: Abbey Chatters Other Clinician: Referring Shaili Donalson: Treating Deontray Hunnicutt/Extender: Duke Salvia in Treatment: 1 Visit Information History Since Last Visit Added or deleted any medications: No Patient Arrived: [name] Any new allergies or adverse reactions: No Arrival Time: 14:37 Had a fall or experienced change in No Accompanied By: self activities of daily living that may affect Transfer Assistance: None risk of falls: Patient Identification Verified: Yes Signs or symptoms of abuse/neglect since last visito No Secondary Verification Process Completed: Yes Hospitalized since last visit: No Patient Requires Transmission-Based Precautions: No Implantable device outside of the clinic excluding No Patient Has Alerts: No cellular tissue based products placed in the center since last visit: Has Dressing in Place as Prescribed: Yes Has Compression in Place as Prescribed: Yes Pain Present Now: Yes Electronic Signature(s) Signed: 02/12/2023 4:56:42 PM By: Thayer Dallas Entered By: Thayer Dallas on 02/12/2023 14:40:40 -------------------------------------------------------------------------------- Encounter Discharge Information Details Patient Name: Date of Service: Kurt Huger MA S O. 02/12/2023 2:30 PM Medical Record Number: [id number] Patient Account Number: [id number] Date of Birth/Sex: Treating RN: 1941/01/30 (82 y.o. Tammy Sours Primary Care Aadith Raudenbush: Abbey Chatters Other Clinician: Referring Zavier Canela: Treating Zeki Bedrosian/Extender: Duke Salvia in Treatment: 1 Encounter  Discharge Information Items Post Procedure Vitals Discharge Condition: Stable Temperature (F): 98.5 Ambulatory Status: Ambulatory Pulse (bpm): 72 Discharge Destination: Home Respiratory Rate (breaths/min): 18 Transportation: Private Auto Blood Pressure (mmHg): 137/73 Accompanied By: self Schedule Follow-up Appointment: Yes Clinical Summary of Care: Electronic Signature(s) Signed: 02/12/2023 6:21:10 PM By: Shawn Stall RN, BSN Entered By: Shawn Stall on 02/12/2023 15:16:18 [name] ([id number]) [id number] Page 2 of 7 -------------------------------------------------------------------------------- Lower Extremity Assessment Details Patient Name: Date of Service: [name] [place] 02/12/2023 2:30 PM Medical Record Number: [id number] Patient Account Number: [id number] Date of Birth/Sex: Treating RN: 1941/07/12 (82 y.o. M) Primary Care Schwanda Zima: Abbey Chatters Other Clinician: Referring Kylee Umana: Treating Joeline Freer/Extender: Duke Salvia in Treatment: 1 Edema Assessment Assessed: [Left: No] [Right: No] [Left: Edema] [Right: :] Calf Left: Right: Point of Measurement: From Medial Instep 41 cm Ankle Left: Right: Point of Measurement: From Medial Instep 26.7 cm Vascular Assessment Extremity colors, hair growth, and conditions: Extremity Color: [Right:Normal] Hair Growth on Extremity: [Right:Yes] Temperature of Extremity: [Right:Warm] Capillary Refill: [Right:< 3 seconds] Dependent Rubor: [Right:No No] Electronic Signature(s) Signed: 02/12/2023 4:56:42 PM By: Thayer Dallas Entered By: Thayer Dallas on 02/12/2023 14:49:00 -------------------------------------------------------------------------------- Multi Wound Chart Details Patient Name: Date of Service: Kurt Huger MA S O. 02/12/2023 2:30 PM Medical Record Number: [id number] Patient Account Number: [id number] Date of Birth/Sex: Treating RN: [birth year] (82  y.o. M) Primary Care Julious Langlois: Abbey Chatters Other Clinician: Referring Virginia Francisco: Treating Guss Farruggia/Extender: Duke Salvia in Treatment: 1 Vital Signs Height(in): 67 Pulse(bpm): 72 Weight(lbs): 230 Blood Pressure(mmHg): 137/73 Body Mass Index(BMI): 36 Temperature(F): 98.5 Respiratory Rate(breaths/min): 18 [1:Photos:] [N/A:N/A] Medial Lower Leg N/A N/A Wound Location: Thermal Burn N/A N/A Wounding Event: 2nd degree Burn N/A N/A Primary Etiology: Arrhythmia, Hypertension, History of N/A N/A Comorbid History: Burn, Rheumatoid Arthritis 12/15/2022 N/A N/A Date Acquired: 1 N/A N/A Weeks of Treatment: Open N/A N/A Wound Status: No N/A N/A Wound Recurrence: 1.8x3.5x0.1 N/A N/A Measurements L x W x  D (cm) 4.948 N/A N/A A (cm) : rea 0.495 N/A N/A Volume (cm) : 60.90% N/A N/A % Reduction in A rea: 60.80% N/A N/A % Reduction in Volume: Full Thickness Without Exposed N/A N/A Classification: Support Structures Medium N/A N/A Exudate A mount: Serosanguineous N/A N/A Exudate Type: red, brown N/A N/A Exudate Color: Distinct, outline attached N/A N/A Wound Margin: Large (67-100%) N/A N/A Granulation A mount: Pink N/A N/A Granulation Quality: Small (1-33%) N/A N/A Necrotic A mount: Fat Layer (Subcutaneous Tissue): Yes N/A N/A Exposed Structures: Small (1-33%) N/A N/A Epithelialization: Debridement - Excisional N/A N/A Debridement: Pre-procedure Verification/Time Out 15:01 N/A N/A Taken: Lidocaine 4% Topical Solution N/A N/A Pain Control: Subcutaneous, Slough N/A N/A Tissue Debrided: Skin/Subcutaneous Tissue N/A N/A Level: 2.12 N/A N/A Debridement A (sq cm): rea Curette N/A N/A Instrument: Minimum N/A N/A Bleeding: Pressure N/A N/A Hemostasis A chieved: 0 N/A N/A Procedural Pain: 0 N/A N/A Post Procedural Pain: Procedure was tolerated well N/A N/A Debridement Treatment Response: 1.8x1.5x0.1 N/A N/A Post Debridement  Measurements L x W x D (cm) 0.212 N/A N/A Post Debridement Volume: (cm) No Abnormalities Noted N/A N/A Periwound Skin Texture: Maceration: No N/A N/A Periwound Skin Moisture: Dry/Scaly: No No Abnormalities Noted N/A N/A Periwound Skin Color: No Abnormality N/A N/A Temperature: Yes N/A N/A Tenderness on Palpation: Debridement N/A N/A Procedures Performed: Treatment Notes Electronic Signature(s) Signed: 02/12/2023 4:37:23 PM By: Geralyn Corwin DO Entered By: Geralyn Corwin on 02/12/2023 15:14:24 -------------------------------------------------------------------------------- Multi-Disciplinary Care Plan Details Patient Name: Date of Service: Kurt Huger MA S O. 02/12/2023 2:30 PM Medical Record Number: [id number] Patient Account Number: [id number] Date of Birth/Sex: Treating RN: [birth year] (82 y.o. Tammy Sours Primary Care Zerline Melchior: Abbey Chatters Other Clinician: Referring Ruberta Holck: Treating Dorethy Tomey/Extender: Duke Salvia in Treatment: [address] ETHIAN, JOVANOVIC ([id number]) [id number] Page 4 of 7 Wound/Skin Impairment Nursing Diagnoses: Knowledge deficit related to smoking impact on wound healing Goals: Patient/caregiver will verbalize understanding of skin care regimen Date Initiated: 02/05/2023 Target Resolution Date: 03/16/2023 Goal Status: Active Interventions: Assess patient/caregiver ability to obtain necessary supplies Assess patient/caregiver ability to perform ulcer/skin care regimen upon admission and as needed Assess ulceration(s) every visit Provide education on ulcer and skin care Screen for HBO Treatment Activities: Skin care regimen initiated : 02/05/2023 Topical wound management initiated : 02/05/2023 Notes: Electronic Signature(s) Signed: 02/12/2023 6:21:10 PM By: Shawn Stall RN, BSN Entered By: Shawn Stall on 02/12/2023  15:02:29 -------------------------------------------------------------------------------- Pain Assessment Details Patient Name: Date of Service: Kurt Huger MA S O. 02/12/2023 2:30 PM Medical Record Number: [id number] Patient Account Number: [id number] Date of Birth/Sex: Treating RN: [birth year] (82 y.o. M) Primary Care Laasya Peyton: Abbey Chatters Other Clinician: Referring Markiyah Gahm: Treating Shacola Schussler/Extender: Duke Salvia in Treatment: 1 Active Problems Location of Pain Severity and Description of Pain Patient Has Paino Yes Site Locations Pain Location: Pain in Ulcers Rate the pain. Current Pain Level: 2 Pain Management and Medication Current Pain Management: Electronic Signature(s) Signed: 02/12/2023 4:56:42 PM By: Jolene Provost, Alveda Reasons ([id number]) [id number] Page 5 of 7 Entered By: Thayer Dallas on 02/12/2023 14:48:40 -------------------------------------------------------------------------------- Patient/Caregiver Education Details Patient Name: Date of Service: [name] [place] 7/30/2024andnbsp2:30 PM Medical Record Number: [id number] Patient Account Number: [id number] Date of Birth/Gender: Treating RN: July 07, 1941 (81 y.o. Tammy Sours Primary Care Physician: Abbey Chatters Other Clinician: Referring Physician: Treating Physician/Extender: Duke Salvia in Treatment: 1 Education Assessment Education Provided To: Patient Education Topics Provided Wound/Skin Impairment: Handouts:  Caring for Your Ulcer Methods: Explain/Verbal Responses: Reinforcements needed Electronic Signature(s) Signed: 02/12/2023 6:21:10 PM By: Shawn Stall RN, BSN Entered By: Shawn Stall on 02/12/2023 15:02:41 -------------------------------------------------------------------------------- Wound Assessment Details Patient Name: Date of Service: Kurt Huger MA [name] 02/12/2023 2:30 PM Medical  Record Number: [id number] Patient Account Number: [id number] Date of Birth/Sex: Treating RN: 05/20/1941 (82 y.o. M) Primary Care Olyn Landstrom: Abbey Chatters Other Clinician: Referring Avrie Kedzierski: Treating Neviah Braud/Extender: Duke Salvia in Treatment: 1 Wound Status Wound Number: 1 Primary 2nd degree Burn Etiology: Wound Location: Medial Lower Leg Wound Status: Open Wounding Event: Thermal Burn Comorbid Arrhythmia, Hypertension, History of Burn, Rheumatoid Date Acquired: 12/15/2022 History: Arthritis Weeks Of Treatment: 1 Clustered Wound: No Photos ARYA, HICKEN ([id number]) [id number] Page 6 of 7 Wound Measurements Length: (cm) 1.8 Width: (cm) 3.5 Depth: (cm) 0.1 Area: (cm) 4.948 Volume: (cm) 0.495 % Reduction in Area: 60.9% % Reduction in Volume: 60.8% Epithelialization: Small (1-33%) Tunneling: No Undermining: No Wound Description Classification: Full Thickness Without Exposed Support Structures Wound Margin: Distinct, outline attached Exudate Amount: Medium Exudate Type: Serosanguineous Exudate Color: red, brown Foul Odor After Cleansing: No Slough/Fibrino Yes Wound Bed Granulation Amount: Large (67-100%) Exposed Structure Granulation Quality: Pink Fat Layer (Subcutaneous Tissue) Exposed: Yes Necrotic Amount: Small (1-33%) Necrotic Quality: Adherent Slough Periwound Skin Texture Texture Color No Abnormalities Noted: Yes No Abnormalities Noted: Yes Moisture Temperature / Pain No Abnormalities Noted: No Temperature: No Abnormality Dry / Scaly: No Tenderness on Palpation: Yes Maceration: No Treatment Notes Wound #1 (Lower Leg) Wound Laterality: Medial Cleanser Soap and Water Discharge Instruction: May shower and wash wound with dial antibacterial soap and water prior to dressing change. Vashe 5.8 (oz) Discharge Instruction: Cleanse the wound with Vashe prior to applying a clean dressing using gauze  sponges, not tissue or cotton balls. Peri-Wound Care Topical antibiotic ointment Discharge Instruction: apply direclty wound bed. Primary Dressing Xeroform Occlusive Gauze Dressing, 4x4 in Discharge Instruction: Apply to wound bed as instructed Secondary Dressing Zetuvit Plus Silicone Border Dressing 4x4 (in/in) Discharge Instruction: Apply silicone border over primary dressing as directed. Secured With Compression Wrap Tubigrip size D Discharge Instruction: apply in the morning and remove at night. Compression Stockings Add-Ons Electronic Signature(s) Signed: 02/12/2023 4:56:42 PM By: Thayer Dallas Entered By: Thayer Dallas on 02/12/2023 14:51:21 [name] ([id number]) [id number] Page 7 of 7 -------------------------------------------------------------------------------- Vitals Details Patient Name: Date of Service: [name] [place] 02/12/2023 2:30 PM Medical Record Number: [id number] Patient Account Number: [id number] Date of Birth/Sex: Treating RN: Nov 10, 1940 (82 y.o. M) Primary Care Claudett Bayly: Abbey Chatters Other Clinician: Referring Lashelle Koy: Treating Glenmore Karl/Extender: Duke Salvia in Treatment: 1 Vital Signs Time Taken: 14:48 Temperature (F): 98.5 Height (in): 67 Pulse (bpm): 72 Weight (lbs): 230 Respiratory Rate (breaths/min): 18 Body Mass Index (BMI): 36 Blood Pressure (mmHg): 137/73 Reference Range: 80 - 120 mg / dl Electronic Signature(s) Signed: 02/12/2023 4:56:42 PM By: Thayer Dallas Entered By: Thayer Dallas on 02/12/2023 14:48:28

## 2023-02-19 ENCOUNTER — Encounter (HOSPITAL_BASED_OUTPATIENT_CLINIC_OR_DEPARTMENT_OTHER): Payer: PPO | Attending: Internal Medicine | Admitting: Internal Medicine

## 2023-02-19 DIAGNOSIS — L97812 Non-pressure chronic ulcer of other part of right lower leg with fat layer exposed: Secondary | ICD-10-CM | POA: Insufficient documentation

## 2023-02-19 DIAGNOSIS — T24231A Burn of second degree of right lower leg, initial encounter: Secondary | ICD-10-CM

## 2023-02-19 DIAGNOSIS — Z87891 Personal history of nicotine dependence: Secondary | ICD-10-CM | POA: Insufficient documentation

## 2023-02-19 DIAGNOSIS — I872 Venous insufficiency (chronic) (peripheral): Secondary | ICD-10-CM | POA: Diagnosis not present

## 2023-02-19 DIAGNOSIS — I10 Essential (primary) hypertension: Secondary | ICD-10-CM | POA: Insufficient documentation
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
## 2023-02-26 ENCOUNTER — Other Ambulatory Visit: Payer: Self-pay | Admitting: Thoracic Surgery (Cardiothoracic Vascular Surgery)

## 2023-02-26 ENCOUNTER — Encounter (HOSPITAL_BASED_OUTPATIENT_CLINIC_OR_DEPARTMENT_OTHER): Payer: PPO | Admitting: Internal Medicine

## 2023-02-26 DIAGNOSIS — Z85118 Personal history of other malignant neoplasm of bronchus and lung: Secondary | ICD-10-CM

## 2023-02-26 DIAGNOSIS — T24231A Burn of second degree of right lower leg, initial encounter: Secondary | ICD-10-CM

## 2023-02-26 DIAGNOSIS — L97812 Non-pressure chronic ulcer of other part of right lower leg with fat layer exposed: Secondary | ICD-10-CM | POA: Diagnosis not present
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
## 2023-03-04 ENCOUNTER — Ambulatory Visit (HOSPITAL_BASED_OUTPATIENT_CLINIC_OR_DEPARTMENT_OTHER): Payer: PPO | Admitting: Internal Medicine

## 2023-03-06 ENCOUNTER — Ambulatory Visit: Payer: PPO | Admitting: Pulmonary Disease

## 2023-03-06 ENCOUNTER — Encounter: Payer: Self-pay | Admitting: Pulmonary Disease

## 2023-03-06 VITALS — BP 134/60 | HR 81 | Temp 97.5°F | Ht 67.0 in | Wt 234.8 lb

## 2023-03-06 DIAGNOSIS — R0609 Other forms of dyspnea: Secondary | ICD-10-CM
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
## 2023-03-06 NOTE — Patient Instructions (Signed)
Continue current inhalers  Continue graded activities as tolerated  Follow-up in 6 months  Call us with significant concerns  Pharmacy may be able to tell you if there is any other inhaler in place of Trelegy that may be more cost effective for you

## 2023-03-07 ENCOUNTER — Encounter (HOSPITAL_BASED_OUTPATIENT_CLINIC_OR_DEPARTMENT_OTHER): Payer: PPO | Admitting: Internal Medicine

## 2023-03-07 DIAGNOSIS — T24231A Burn of second degree of right lower leg, initial encounter: Secondary | ICD-10-CM

## 2023-03-07 DIAGNOSIS — L97812 Non-pressure chronic ulcer of other part of right lower leg with fat layer exposed: Secondary | ICD-10-CM

## 2023-03-11 ENCOUNTER — Ambulatory Visit (HOSPITAL_BASED_OUTPATIENT_CLINIC_OR_DEPARTMENT_OTHER): Payer: PPO | Admitting: Internal Medicine
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
## 2023-03-12 NOTE — Progress Notes (Signed)
THERYN, CICH ([id number]) [id number] Page 1 of 6 Visit Report for 03/07/2023 Arrival Information Details Patient Name: Date of Service: [name] [place] 03/07/2023 9:30 A M Medical Record Number: [id number] Patient Account Number: [id number] Date of Birth/Sex: Treating RN: 10/06/1940 (82 y.o. [name] Primary Care Walida Cajas: Abbey Chatters Other Clinician: Referring Vanden Fawaz: Treating Eris Breck/Extender: Duke Salvia in Treatment: 4 Visit Information History Since Last Visit All ordered tests and consults were completed: Yes Patient Arrived: Ambulatory Added or deleted any medications: No Arrival Time: 09:47 Any new allergies or adverse reactions: No Accompanied By: self Had a fall or experienced change in No Transfer Assistance: Manual activities of daily living that may affect Patient Identification Verified: Yes risk of falls: Secondary Verification Process Completed: Yes Signs or symptoms of abuse/neglect since last visito No Patient Requires Transmission-Based Precautions: No Hospitalized since last visit: No Patient Has Alerts: No Implantable device outside of the clinic excluding No cellular tissue based products placed in the center since last visit: Has Dressing in Place as Prescribed: Yes Pain Present Now: No Electronic Signature(s) Signed: 03/12/2023 2:02:59 PM By: Brenton Grills Entered By: Brenton Grills on 03/07/2023 09:48:54 -------------------------------------------------------------------------------- Clinic Level of Care Assessment Details Patient Name: Date of Service: [name] [place] 03/07/2023 9:30 A M Medical Record Number: [id number] Patient Account Number: [id number] Date of Birth/Sex: Treating RN: 08/18/1940 (82 y.o. [name] Primary Care Alvia Tory: Abbey Chatters Other Clinician: Referring Ariston Grandison: Treating Dorcus Riga/Extender: Duke Salvia in Treatment: 4 Clinic Level of Care Assessment Items TOOL 4 Quantity Score X- 1 0 Use when only an EandM is performed on FOLLOW-UP visit ASSESSMENTS - Nursing Assessment / Reassessment X- 1 10 Reassessment of Co-morbidities (includes updates in patient status) X- 1 5 Reassessment of Adherence to Treatment Plan ASSESSMENTS - Wound and Skin A ssessment / Reassessment []  - 0 Simple Wound Assessment / Reassessment - one wound []  - 0 Complex Wound Assessment / Reassessment - multiple wounds []  - 0 Dermatologic / Skin Assessment (not related to wound area) ASSESSMENTS - Focused Assessment X- 1 5 Circumferential Edema Measurements - multi extremities []  - 0 Nutritional Assessment / Counseling / Intervention EAGLE, KILDUFF ([id number]) [id number] Page 2 of 6 X- 1 5 Lower Extremity Assessment (monofilament, tuning fork, pulses) []  - 0 Peripheral Arterial Disease Assessment (using hand held doppler) ASSESSMENTS - Ostomy and/or Continence Assessment and Care []  - 0 Incontinence Assessment and Management []  - 0 Ostomy Care Assessment and Management (repouching, etc.) PROCESS - Coordination of Care X - Simple Patient / Family Education for ongoing care 1 15 []  - 0 Complex (extensive) Patient / Family Education for ongoing care X- 1 10 Staff obtains Chiropractor, Records, T Results / Process Orders est []  - 0 Staff telephones HHA, Nursing Homes / Clarify orders / etc []  - 0 Routine Transfer to another Facility (non-emergent condition) []  - 0 Routine Hospital Admission (non-emergent condition) []  - 0 New Admissions / Manufacturing engineer / Ordering NPWT Apligraf, etc. , []  - 0 Emergency Hospital Admission (emergent condition) X- 1 10 Simple Discharge Coordination []  - 0 Complex (extensive) Discharge Coordination PROCESS - Special Needs []  - 0 Pediatric / Minor Patient Management []  - 0 Isolation Patient Management []  - 0 Hearing  / Language / Visual special needs []  - 0 Assessment of Community assistance (transportation, D/C planning, etc.) []  - 0 Additional assistance / Altered mentation []  - 0 Support Surface(s) Assessment (bed, cushion, seat, etc.)  INTERVENTIONS - Wound Cleansing / Measurement []  - 0 Simple Wound Cleansing - one wound []  - 0 Complex Wound Cleansing - multiple wounds []  - 0 Wound Imaging (photographs - any number of wounds) []  - 0 Wound Tracing (instead of photographs) []  - 0 Simple Wound Measurement - one wound []  - 0 Complex Wound Measurement - multiple wounds INTERVENTIONS - Wound Dressings []  - 0 Small Wound Dressing one or multiple wounds []  - 0 Medium Wound Dressing one or multiple wounds []  - 0 Large Wound Dressing one or multiple wounds []  - 0 Application of Medications - topical []  - 0 Application of Medications - injection INTERVENTIONS - Miscellaneous []  - 0 External ear exam []  - 0 Specimen Collection (cultures, biopsies, blood, body fluids, etc.) []  - 0 Specimen(s) / Culture(s) sent or taken to Lab for analysis []  - 0 Patient Transfer (multiple staff / Nurse, adult / Similar devices) []  - 0 Simple Staple / Suture removal (25 or less) []  - 0 Complex Staple / Suture removal (26 or more) []  - 0 Hypo / Hyperglycemic Management (close monitor of Blood Glucose) JAFETH, MCGARY ([id number]) [id number] Page 3 of 6 []  - 0 Ankle / Brachial Index (ABI) - do not check if billed separately X- 1 5 Vital Signs Has the patient been seen at the hospital within the last three years: Yes Total Score: 65 Level Of Care: New/Established - Level 2 Electronic Signature(s) Signed: 03/12/2023 2:02:59 PM By: Brenton Grills Entered By: Brenton Grills on 03/07/2023 10:07:21 -------------------------------------------------------------------------------- Encounter Discharge Information Details Patient Name: Date of Service: Kurt Huger MA S O. 03/07/2023  9:30 A M Medical Record Number: [id number] Patient Account Number: [id number] Date of Birth/Sex: Treating RN: 11/15/1940 (82 y.o. [name] Primary Care Tihanna Goodson: Abbey Chatters Other Clinician: Referring Tamica Covell: Treating Ananda Caya/Extender: Duke Salvia in Treatment: 4 Encounter Discharge Information Items Discharge Condition: Stable Ambulatory Status: Cane Discharge Destination: Home Transportation: Private Auto Accompanied By: self Schedule Follow-up Appointment: No Clinical Summary of Care: Patient Declined Electronic Signature(s) Signed: 03/12/2023 2:02:59 PM By: Brenton Grills Entered By: Brenton Grills on 03/07/2023 10:08:15 -------------------------------------------------------------------------------- Lower Extremity Assessment Details Patient Name: Date of Service: Kurt Huger MA [name] 03/07/2023 9:30 A M Medical Record Number: [id number] Patient Account Number: [id number] Date of Birth/Sex: Treating RN: [birth year] (82 y.o. [name] Primary Care Tian Davison: Abbey Chatters Other Clinician: Referring Shalae Belmonte: Treating Kailin Leu/Extender: Vilma Meckel Weeks in Treatment: 4 Edema Assessment Assessed: [Left: No] [Right: No] [Left: Edema] [Right: :] Calf Left: Right: Point of Measurement: From Medial Instep 40.2 cm Ankle Left: Right: Point of Measurement: From Medial Instep 25.6 cm Vascular Assessment Left: [id number] Page 4 of 6Right:] Pulses: Dorsalis Pedis Palpable: [id number] Page 4 of 6Yes] Extremity colors, hair growth, and conditions: Extremity Color: [id number] Page 4 of 6Normal] Hair Growth on Extremity: [id number] Page 4 of 6Yes] Temperature of Extremity: [[id number] Page 4 of 6Warm] Capillary Refill: [id number] Page 4 of 6<  3 seconds] Dependent Rubor: [[id number] Page 4 of 6No No] Toe Nail Assessment Left: Right: Thick: No Discolored: No Deformed: No Improper Length and Hygiene: No Electronic Signature(s) Signed: 03/12/2023 2:02:59 PM By: Brenton Grills Entered By: Brenton Grills on 03/07/2023 09:53:19 -------------------------------------------------------------------------------- Multi Wound Chart Details Patient Name: Date of Service: Kurt Huger MA S O. 03/07/2023 9:30 A M Medical Record Number: [id number] Patient Account Number: [id number] Date of Birth/Sex: Treating RN: 11/18/1940 (82 y.o. M) Primary Care Phinehas Grounds: Abbey Chatters Other Clinician: Referring Markie Frith: Treating Jmichael Gille/Extender:  Vilma Meckel Weeks in Treatment: 4 Vital Signs Height(in): 67 Pulse(bpm): 69 Weight(lbs): 230 Blood Pressure(mmHg): 123/67 Body Mass Index(BMI): 36 Temperature(F): 97.9 Respiratory Rate(breaths/min): 18 [Treatment Notes:Wound Assessments Treatment Notes] Electronic Signature(s) Signed: 03/11/2023 1:35:31 PM By: Geralyn Corwin DO Entered By: Geralyn Corwin on 03/07/2023 10:15:22 -------------------------------------------------------------------------------- Multi-Disciplinary Care Plan Details Patient Name: Date of Service: Kurt Huger MA S O. 03/07/2023 9:30 A M Medical Record Number: [id number] Patient Account Number: [id number] Date of Birth/Sex: Treating RN: 12/06/1940 (82 y.o. [name] Primary Care Joline Encalada: Abbey Chatters Other Clinician: Referring Kami Kube: Treating Aviyah Swetz/Extender: Duke Salvia in Treatment: 4 NILAN, WAYMACK ([id number]) [id number] Page 5 of 6 Active Inactive Electronic Signature(s) Signed: 03/12/2023 2:02:59 PM By: Brenton Grills Entered By: Brenton Grills on 03/07/2023  10:04:30 -------------------------------------------------------------------------------- Pain Assessment Details Patient Name: Date of Service: Kurt Huger MA [name] 03/07/2023 9:30 A M Medical Record Number: [id number] Patient Account Number: [id number] Date of Birth/Sex: Treating RN: January 16, 1941 (82 y.o. [name] Primary Care Rodolfo Notaro: Abbey Chatters Other Clinician: Referring Anuj Summons: Treating Gennett Garcia/Extender: Vilma Meckel Weeks in Treatment: 4 Active Problems Location of Pain Severity and Description of Pain Patient Has Paino No Site Locations Pain Management and Medication Current Pain Management: Electronic Signature(s) Signed: 03/12/2023 2:02:59 PM By: Brenton Grills Entered By: Brenton Grills on 03/07/2023 09:49:39 -------------------------------------------------------------------------------- Patient/Caregiver Education Details Patient Name: Date of Service: [name] 8/22/2024andnbsp9:30 A M Medical Record Number: [id number] Patient Account Number: [id number] Date of Birth/Gender: Treating RN: 05-27-1941 (82 y.o. [name] Primary Care Physician: Abbey Chatters Other Clinician: Referring Physician: Treating Physician/Extender: Duke Salvia in Treatment: 4 Education [address] SUVIR, GOERTZ ([id number]) [id number] Page 6 of 6 Education Provided To: Patient Education Topics Provided Wound/Skin Impairment: Methods: Explain/Verbal Responses: State content correctly Electronic Signature(s) Signed: 03/12/2023 2:02:59 PM By: Brenton Grills Entered By: Brenton Grills on 03/07/2023 10:05:02 -------------------------------------------------------------------------------- Vitals Details Patient Name: Date of Service: Kurt Huger MA S O. 03/07/2023 9:30 A M Medical Record Number: [id number] Patient Account Number: [id number] Date of Birth/Sex: Treating  RN: [birth year] (82 y.o. [name] Primary Care Maddyx Wieck: Abbey Chatters Other Clinician: Referring Andalyn Heckstall: Treating Anjali Manzella/Extender: Duke Salvia in Treatment: 4 Vital Signs Time Taken: 09:48 Temperature (F): 97.9 Height (in): 67 Pulse (bpm): 69 Weight (lbs): 230 Respiratory Rate (breaths/min): 18 Body Mass Index (BMI): 36 Blood Pressure (mmHg): 123/67 Reference Range: 80 - 120 mg / dl Electronic Signature(s) Signed: 03/12/2023 2:02:59 PM By: Brenton Grills Entered By: Brenton Grills on 03/07/2023 09:49:32

## 2023-04-01 ENCOUNTER — Other Ambulatory Visit: Payer: Self-pay | Admitting: *Deleted

## 2023-04-01 ENCOUNTER — Telehealth: Payer: Self-pay | Admitting: Pulmonary Disease

## 2023-04-01 MED ORDER — TRELEGY ELLIPTA 100-62.5-25 MCG/ACT IN AEPB: 1.0000 | INHALATION_SPRAY | Freq: Every day | RESPIRATORY_TRACT | Status: DC

## 2023-04-01 NOTE — Telephone Encounter (Signed)
Called and spoke with patient, he states he is in the donut hole until the end of the year.  A 3 month supply of Trelegy was costing him about $90 and now it is over $700.  Advised him I would put some samples up front for him along with an application for patient assistance.  Nothing further needed.

## 2023-04-08 ENCOUNTER — Ambulatory Visit (INDEPENDENT_AMBULATORY_CARE_PROVIDER_SITE_OTHER): Payer: PPO | Admitting: Nurse Practitioner

## 2023-04-08 ENCOUNTER — Encounter: Payer: Self-pay | Admitting: Nurse Practitioner

## 2023-04-08 VITALS — BP 138/74 | HR 63 | Temp 97.3°F | Ht 67.0 in | Wt 237.0 lb

## 2023-04-08 DIAGNOSIS — F17200 Nicotine dependence, unspecified, uncomplicated: Secondary | ICD-10-CM | POA: Diagnosis not present

## 2023-04-08 DIAGNOSIS — G8929 Other chronic pain: Secondary | ICD-10-CM

## 2023-04-08 DIAGNOSIS — R739 Hyperglycemia, unspecified: Secondary | ICD-10-CM

## 2023-04-08 DIAGNOSIS — E782 Mixed hyperlipidemia: Secondary | ICD-10-CM | POA: Diagnosis not present

## 2023-04-08 DIAGNOSIS — Z66 Do not resuscitate: Secondary | ICD-10-CM

## 2023-04-08 DIAGNOSIS — Z9189 Other specified personal risk factors, not elsewhere classified: Secondary | ICD-10-CM

## 2023-04-08 DIAGNOSIS — M545 Low back pain, unspecified: Secondary | ICD-10-CM | POA: Diagnosis not present

## 2023-04-08 DIAGNOSIS — I10 Essential (primary) hypertension: Secondary | ICD-10-CM | POA: Diagnosis not present

## 2023-04-08 NOTE — Patient Instructions (Addendum)
To get b12 over the counter [phone] mg daily Continue exercise- let us know if you want physical therapy order.

## 2023-04-08 NOTE — Progress Notes (Signed)
Careteam: Patient Care Team: Sharon Seller, NP as PCP - General (Nurse Practitioner) Cherlyn Roberts, MD as Consulting Physician (Dermatology) Rachael Fee, MD as Attending Physician (Gastroenterology)  PLACE OF SERVICE:  [facility]  Advanced Directive information Does Patient Have a Medical Advance Directive?: Yes, Type of Advance Directive: Out of facility DNR (pink MOST or yellow form), Pre-existing out of facility DNR order (yellow form or pink MOST form): Yellow form placed in chart (order not valid for inpatient use);Pink MOST form placed in chart (order not valid for inpatient use), Does patient want to make changes to medical advance directive?: No - Patient declined  Allergies  Allergen Reactions   Asa [Aspirin] Anaphylaxis and Swelling    Angioedema/Eyes and lip swelling   Naproxen Hives   Nsaids Hives   Tolmetin Hives    Chief Complaint  Patient presents with   Medical Management of Chronic Issues    6 month follow-up. Discuss need for covid boosters.       HPI: Patient is a 82 y.o. male for 6 month follow-up. Trying to eat healthier, eats fish and chicken, vegetables and fruits. Uses the air fryer. Drinks water, black coffee, and 1 pepsi zero per day.  Doing more exercise since about 3 weeks ago now that he feels better. Using hand weights for exercise. Has stationary bike that he uses at home.   Smoking about 1/2 pack/day.   Feels like he gets down and doesn't want to do anything but states he's not depressed, it's more lack of motivation. Wants to discuss if there's any vitamins or something to help with his energy levels and some motivation. Is still able to do yardwork. States back pain is not as bad anymore, it's just weak.   Had recent burn on his leg, saw wound care for treatment, it's healing well. He was impressed by staff at wound center.   States he received RSV and flu vaccine at the pharmacy.   Review of Systems:  Review of Systems   Constitutional:  Negative for chills, fever and malaise/fatigue.  Respiratory:  Positive for shortness of breath (occassional d/t COPD, has inhaler). Negative for cough.   Cardiovascular:  Negative for chest pain and palpitations.  Gastrointestinal:  Negative for constipation, diarrhea, nausea and vomiting.  Genitourinary:  Positive for frequency. Negative for dysuria and urgency.  Musculoskeletal:  Positive for back pain.  Neurological:  Positive for weakness (in back). Negative for dizziness and headaches.  Psychiatric/Behavioral:  Negative for depression. The patient is not nervous/anxious and does not have insomnia.     Past Medical History:  Diagnosis Date   A-fib [facility])    Acute appendicitis with peritoneal abscess 01/28/2013   Allergy    seasonal    Anxiety    Arthritis    Bacteremia September 2015   Elevated PSA    Essential hypertension 04/16/2014   Heart murmur    childhood   Ileus, postoperative (HCC) 01/28/2013   Lung cancer (HCC) 2016   Obesity, morbid (HCC) 01/28/2013   Shortness of breath    Tobacco use disorder 01/28/2013   Past Surgical History:  Procedure Laterality Date   APPENDECTOMY     BACK SURGERY  1997, 2003   Dr.Apleton    CARPAL TUNNEL RELEASE     COLONOSCOPY     x 3   EYE SURGERY Left    muscle of the eye    KNEE SURGERY Bilateral    LAPAROSCOPIC APPENDECTOMY N/A 01/25/2013  Procedure: APPENDECTOMY LAPAROSCOPIC;  Surgeon: [name], MD;  Location: [facility] OR;  Service: General;  Laterality: N/A;   LYMPH NODE DISSECTION Left 04/26/2015   Procedure: LYMPH NODE DISSECTION;  Surgeon: Delight Ovens, MD;  Location: [facility] OR;  Service: Thoracic;  Laterality: Left;   POLYPECTOMY     SHOULDER SURGERY Right 2014   Dr.Whitfield rotator cuff surgery   SKIN SURGERY Right 01/2019   Right Leg, cancerous area removed   SKIN SURGERY  01/01/2022   Right hand (wrist and between thumb and forefinger). [facility]   TEE WITHOUT CARDIOVERSION N/A  04/07/2014   Procedure: TRANSESOPHAGEAL ECHOCARDIOGRAM (TEE);  Surgeon: Donato Schultz, MD;  Location: [facility] ENDOSCOPY;  Service: Cardiovascular;  Laterality: N/A;   VIDEO ASSISTED THORACOSCOPY (VATS)/WEDGE RESECTION Left 04/26/2015   Procedure: LEFT VIDEO ASSISTED THORACOSCOPY (VATS) WITH LEFT LOWER LOBECTOMY;  Surgeon: Delight Ovens, MD;  Location: [facility] OR;  Service: Thoracic;  Laterality: Left;   VIDEO BRONCHOSCOPY N/A 04/26/2015   Procedure: VIDEO BRONCHOSCOPY;  Surgeon: Delight Ovens, MD;  Location: [facility] OR;  Service: Thoracic;  Laterality: N/A;   Social History:   reports that he has been smoking cigarettes and cigars. He started smoking about 64 years ago. He has a 32.4 pack-year smoking history. He quit smokeless tobacco use about 19 years ago.  His smokeless tobacco use included chew. He reports that he does not drink alcohol and does not use drugs.  Family History  Problem Relation Age of Onset   Irritable bowel syndrome Mother    Cancer Neg Hx    Lung disease Neg Hx    Colon cancer Neg Hx    Colon polyps Neg Hx    Esophageal cancer Neg Hx    Rectal cancer Neg Hx    Stomach cancer Neg Hx     Medications: Patient's Medications  New Prescriptions   No medications on file  Previous Medications   ATORVASTATIN (LIPITOR) 10 MG TABLET    TAKE 1 TABLET(10 MG) BY MOUTH DAILY   FLUTICASONE-UMECLIDIN-VILANT (TRELEGY ELLIPTA) 100-62.5-25 MCG/ACT AEPB    Inhale 1 puff into the lungs daily.   HYDRALAZINE (APRESOLINE) 25 MG TABLET    Take 1 tablet (25 mg total) by mouth 3 (three) times daily.   LOSARTAN (COZAAR) 50 MG TABLET    Take 1 tablet (50 mg total) by mouth daily.   METOPROLOL TARTRATE (LOPRESSOR) 25 MG TABLET    Take 1/2 (one-half) tablet by mouth twice daily  Modified Medications   No medications on file  Discontinued Medications   CHLORHEXIDINE (HIBICLENS) 4 % EXTERNAL LIQUID    Apply topically daily as needed.   FLUTICASONE-UMECLIDIN-VILANT (TRELEGY ELLIPTA) 100-62.5-25  MCG/ACT AEPB    INHALE 1 PUFF INTO LUNGS ONCE DAILY   SILVER SULFADIAZINE (SILVADENE) 1 % CREAM    Apply 1 Application topically daily.    Physical Exam:  Vitals:   04/08/23 0804  BP: 138/74  Pulse: 63  Temp: (!) 97.3 F (36.3 C)  TempSrc: Temporal  SpO2: 95%  Weight: 107.5 kg  Height: 5\' 7"  (1.702 m)   Body mass index is 37.12 kg/m. Wt Readings from Last 3 Encounters:  04/08/23 237 lb (107.5 kg)  03/06/23 234 lb 12.8 oz (106.5 kg)  01/14/23 236 lb (107 kg)    Physical Exam Constitutional:      Appearance: Normal appearance. He is obese.  Cardiovascular:     Rate and Rhythm: Normal rate and regular rhythm.     Pulses: Normal pulses.  Heart sounds: Normal heart sounds.  Pulmonary:     Effort: Pulmonary effort is normal.     Breath sounds: Normal breath sounds.  Abdominal:     General: Bowel sounds are normal.     Palpations: Abdomen is soft.  Musculoskeletal:     Right lower leg: Edema present.     Left lower leg: Edema present.  Skin:    General: Skin is warm and dry.  Neurological:     General: No focal deficit present.     Mental Status: He is alert and oriented to person, place, and time. Mental status is at baseline.  Psychiatric:        Mood and Affect: Mood normal.     Labs reviewed: Basic Metabolic Panel: Recent Labs    10/01/22 0803  NA 142  K 4.5  CL 107  CO2 25  GLUCOSE 96  BUN 27*  CREATININE 1.05  CALCIUM 9.6   Liver Function Tests: Recent Labs    10/01/22 0803  AST 18  ALT 16  BILITOT 0.5  PROT 7.2   No results for input(s): "LIPASE", "AMYLASE" in the last 8760 hours. No results for input(s): "AMMONIA" in the last 8760 hours. CBC: Recent Labs    10/01/22 0803  WBC 8.7  NEUTROABS 6,386  HGB 16.7  HCT 50.6*  MCV 96.6  PLT 221   Lipid Panel: Recent Labs    10/01/22 0803  CHOL 131  HDL 39*  LDLCALC 66  TRIG 161*  CHOLHDL 3.4   TSH: No results for input(s): "TSH" in the last 8760 hours. A1C: Lab Results   Component Value Date   HGBA1C 6.2 (H) 10/01/2022     Assessment/Plan 1. Essential hypertension -Controlled; at goal <140/90 -Continue current medication regimen - COMPLETE METABOLIC PANEL WITH GFR - CBC with Differential/Platelet  2. Mixed hyperlipidemia -Continue atorvastatin - Lipid panel - COMPLETE METABOLIC PANEL WITH GFR  3. Obesity, morbid (HCC) -BMI > 35 with HTN, HLD, hyperglycemia  --Encouraged dietary modifications and physical activity as tolerated  4. Chronic bilateral low back pain without sciatica -Continue self core strengthening; states he learned techniques previously and he's not interested in PT at this time -Consider PT, will call office if he changes his mind No pain at this time.   5. Hyperglycemia -Encouraged dietary modifications and physical activity as tolerated - Hemoglobin A1c  6. Tobacco use disorder -Smoking cessation encouraged  7. Lack of motivation -Consider OTC B12: 500-1021mcg, take in the morning, start with , increase up to if no effect with -Consider PT, will call office if he changes his mind  8. DNR (do not resuscitate) -Confirmed DNR status   Return in about 6 months (around 10/06/2023) for routine follow up.  Rollen Sox, FNP-MSN Student -I personally was present during the history, physical exam and medical decision-making activities of this service and have verified that the service and findings are accurately documented in the student's note  Abbey Chatters, NP

## 2023-04-09 ENCOUNTER — Ambulatory Visit
Admission: RE | Admit: 2023-04-09 | Discharge: 2023-04-09 | Disposition: A | Discharge status: Home / Self Care | Payer: PPO | Source: Ambulatory Visit | Attending: Thoracic Surgery (Cardiothoracic Vascular Surgery) | Admitting: Thoracic Surgery (Cardiothoracic Vascular Surgery)

## 2023-04-09 DIAGNOSIS — Z85118 Personal history of other malignant neoplasm of bronchus and lung: Secondary | ICD-10-CM

## 2023-04-09 DIAGNOSIS — C3432 Malignant neoplasm of lower lobe, left bronchus or lung: Secondary | ICD-10-CM | POA: Diagnosis not present

## 2023-04-09 LAB — COMPLETE METABOLIC PANEL WITH GFR
AG Ratio: 1.5 (calc) (ref 1.0–2.5)
ALT: 12 U/L (ref 9–46)
AST: 14 U/L (ref 10–35)
Albumin: 4.2 g/dL (ref 3.6–5.1)
Alkaline phosphatase (APISO): 79 U/L (ref 35–144)
BUN: 18 mg/dL (ref 7–25)
CO2: 26 mmol/L (ref 20–32)
Calcium: 9.5 mg/dL (ref 8.6–10.3)
Chloride: 106 mmol/L (ref 98–110)
Creat: 1.02 mg/dL (ref 0.70–1.28)
Globulin: 2.8 g/dL (calc) (ref 1.9–3.7)
Glucose, Bld: 96 mg/dL (ref 65–99)
Potassium: 4.7 mmol/L (ref 3.5–5.3)
Sodium: 141 mmol/L (ref 135–146)
Total Bilirubin: 0.6 mg/dL (ref 0.2–1.2)
Total Protein: 7 g/dL (ref 6.1–8.1)
eGFR: 75 mL/min/{1.73_m2} (ref >=60)

## 2023-04-09 LAB — HEMOGLOBIN A1C
Hgb A1c MFr Bld: 5.8 % of total Hgb — ABNORMAL HIGH (ref <5.7)
Mean Plasma Glucose: 120 mg/dL
eAG (mmol/L): 6.6 mmol/L

## 2023-04-09 LAB — CBC WITH DIFFERENTIAL/PLATELET
Absolute Monocytes: 758 cells/uL (ref 200–950)
Basophils Absolute: 40 cells/uL (ref 0–200)
Basophils Relative: 0.5 %
Eosinophils Absolute: 87 cells/uL (ref 15–500)
Eosinophils Relative: 1.1 %
HCT: 49.3 % (ref 38.5–50.0)
Hemoglobin: 16 g/dL (ref 13.2–17.1)
Lymphs Abs: 1304 cells/uL (ref 850–3900)
MCH: 31.8 pg (ref 27.0–33.0)
MCHC: 32.5 g/dL (ref 32.0–36.0)
MCV: 98 fL (ref 80.0–100.0)
MPV: 10.8 fL (ref 7.5–12.5)
Monocytes Relative: 9.6 %
Neutro Abs: 5712 cells/uL (ref 1500–7800)
Neutrophils Relative %: 72.3 %
Platelets: 249 10*3/uL (ref 140–400)
RBC: 5.03 10*6/uL (ref 4.20–5.80)
RDW: 12.8 % (ref 11.0–15.0)
Total Lymphocyte: 16.5 %
WBC: 7.9 10*3/uL (ref 3.8–10.8)

## 2023-04-09 LAB — LIPID PANEL
Cholesterol: 117 mg/dL (ref <200)
HDL: 36 mg/dL — ABNORMAL LOW (ref >=40)
LDL Cholesterol (Calc): 61 mg/dL (calc)
Non-HDL Cholesterol (Calc): 81 mg/dL (calc) (ref <130)
Total CHOL/HDL Ratio: 3.3 (calc) (ref <5.0)
Triglycerides: 118 mg/dL (ref <150)

## 2023-04-16 ENCOUNTER — Ambulatory Visit: Payer: PPO | Admitting: Thoracic Surgery (Cardiothoracic Vascular Surgery)

## 2023-04-16 ENCOUNTER — Encounter: Payer: Self-pay | Admitting: Thoracic Surgery (Cardiothoracic Vascular Surgery)

## 2023-04-16 VITALS — BP 137/63 | HR 77 | Resp 20 | Ht 67.0 in | Wt 236.0 lb

## 2023-04-16 DIAGNOSIS — Z902 Acquired absence of lung [part of]: Secondary | ICD-10-CM

## 2023-04-16 NOTE — Progress Notes (Signed)
[address]       [place] [address]             [phone]     HPI: [name] returns for an annual follow-up visit with a CT for lung cancer screening.  [name] is a 82 year old man with a history of tobacco abuse, sarcomatoid carcinoma of the lung, left lower lobectomy, COPD, hypertension, atrial fibrillation, heart murmur, and anxiety.  Dr. Tyrone Sage did a left lower lobectomy in 2016.   He has been followed with CT scans since his lobectomy.  I last saw him in the office a year ago.  His CT showed no concerning findings at that time.   He has been doing reasonably well.  He quit smoking in 2022.  He currently is on Trelegy inhaler.  He says his breathing is better than it has been in quite some time.  Biggest issue is back pain.  Past Medical History:  Diagnosis Date   A-fib [facility])    Acute appendicitis with peritoneal abscess 01/28/2013   Allergy    seasonal    Anxiety    Arthritis    Bacteremia September 2015   Elevated PSA    Essential hypertension 04/16/2014   Heart murmur    childhood   Ileus, postoperative (HCC) 01/28/2013   Lung cancer (HCC) 2016   Obesity, morbid (HCC) 01/28/2013   Shortness of breath    Tobacco use disorder 01/28/2013    Current Outpatient Medications  Medication Sig Dispense Refill   atorvastatin (LIPITOR) 10 MG tablet TAKE 1 TABLET(10 MG) BY MOUTH DAILY 90 tablet 3   Fluticasone-Umeclidin-Vilant (TRELEGY ELLIPTA) 100-62.5-25 MCG/ACT AEPB Inhale 1 puff into the lungs daily.     hydrALAZINE (APRESOLINE) 25 MG tablet Take 1 tablet (25 mg total) by mouth 3 (three) times daily. 270 tablet 3   losartan (COZAAR) 50 MG tablet Take 1 tablet (50 mg total) by mouth daily. 90 tablet 3   metoprolol tartrate (LOPRESSOR) 25 MG tablet Take 1/2 (one-half) tablet by mouth twice daily 90 tablet 1   No current facility-administered medications for this visit.    Physical Exam BP 137/63 (BP Location: Left Arm, Patient Position:  Sitting, Cuff Size: Normal)   Pulse 77   Resp 20   Ht 5\' 7"  (1.702 m)   Wt 236 lb (107 kg)   SpO2 90% Comment: RA  BMI 36.16 kg/m  82 year old man in no acute distress Alert and oriented x 3 with no focal deficits Lungs diminished breath sounds bilaterally left greater than right, no wheezing Cardiac regular rate and rhythm No cervical or supraclavicular adenopathy  Diagnostic Tests: CT CHEST WITHOUT CONTRAST   TECHNIQUE: Multidetector CT imaging of the chest was performed following the standard protocol without IV contrast.   RADIATION DOSE REDUCTION: This exam was performed according to the departmental dose-optimization program which includes automated exposure control, adjustment of the mA and/or kV according to patient size and/or use of iterative reconstruction technique.   COMPARISON:  04/05/2022   FINDINGS: Cardiovascular: The heart size is normal. No substantial pericardial effusion. Coronary artery calcification is evident. Mild atherosclerotic calcification is noted in the wall of the thoracic aorta. Enlargement of the pulmonary outflow tract/main pulmonary arteries suggests pulmonary arterial hypertension.   Mediastinum/Nodes: No mediastinal lymphadenopathy. Scattered small mediastinal lymph nodes are stable. No evidence for gross hilar lymphadenopathy although assessment is limited by the lack of intravenous contrast on the current study. The esophagus has normal imaging  features. There is no axillary lymphadenopathy.   Lungs/Pleura: New patchy and consolidative airspace disease is seen in the lateral right costophrenic sulcus (image 125/2) suggesting infectious/inflammatory etiology. There is some similar atelectasis or linear scarring in the posterior right lung base. Volume loss left hemithorax consistent with left lower lobectomy. There is some architectural distortion/scarring towards the left base with chronic pleural thickening in the deep posterior  left costophrenic sulcus. No new suspicious pulmonary nodule or mass in the left lung.   Upper Abdomen: Stable 2.2 cm hypodensity in the left liver compatible with a cyst. No followup imaging is recommended. No adrenal nodule or mass.   Musculoskeletal: No worrisome lytic or sclerotic osseous abnormality. Superficial subcutaneous nodules posterior right chest wall are similar to prior, likely sebaceous cysts.   IMPRESSION: 1. New patchy and consolidative airspace disease in the lateral right costophrenic sulcus suggesting infectious/inflammatory etiology with pneumonia a distinct consideration. Close follow-up recommended to ensure resolution. 2. Stable postoperative changes in the left hemithorax without evidence for recurrent or metastatic disease. 3. Enlargement of the pulmonary outflow tract/main pulmonary arteries suggests pulmonary arterial hypertension. 4.  Aortic Atherosclerosis (ICD10-I70.0).     Electronically Signed   By: Kennith Center M.D.   On: 04/09/2023 12:51 I personally reviewed the CT images.  There is consolidation at the right base.  No evidence of recurrent disease.  Aortic and coronary atherosclerosis.  Impression: [name] is a 82 year old man with a history of tobacco abuse, sarcomatoid carcinoma of the lung, left lower lobectomy, COPD, hypertension, atrial fibrillation, heart murmur, and anxiety.  Dr. Tyrone Sage did a left lower lobectomy in 2016.  CT chest today shows a small area of consolidation at the right base.  He is asymptomatic.  Recommended we do another CT in 6 months to make sure that resolves and is not a neoplasm.   Tobacco abuse-quit smoke being in 2022.   Sarcomatoid carcinoma left lower lobe-now 7 years out from surgery with no evidence of recurrent disease.  Given his smoking history and history of lung cancer he needs continued annual CT scan.   COPD-symptoms well controlled with Trelegy.    Plan: Return in 6 months with CT chest  to follow-up right lower lobe consolidation  Loreli Slot, MD [facility] [phone]

## 2023-05-21 ENCOUNTER — Telehealth: Payer: Self-pay | Admitting: Pulmonary Disease

## 2023-05-21 NOTE — Telephone Encounter (Signed)
Patient is requesting 2 samples of trellegy. Please advise. In doughnut hole.

## 2023-05-24 DIAGNOSIS — L821 Other seborrheic keratosis: Secondary | ICD-10-CM | POA: Diagnosis not present

## 2023-05-24 DIAGNOSIS — L718 Other rosacea: Secondary | ICD-10-CM | POA: Diagnosis not present

## 2023-05-24 DIAGNOSIS — L814 Other melanin hyperpigmentation: Secondary | ICD-10-CM | POA: Diagnosis not present

## 2023-05-24 DIAGNOSIS — L720 Epidermal cyst: Secondary | ICD-10-CM | POA: Diagnosis not present

## 2023-05-24 DIAGNOSIS — Z85828 Personal history of other malignant neoplasm of skin: Secondary | ICD-10-CM | POA: Diagnosis not present

## 2023-05-24 DIAGNOSIS — Z08 Encounter for follow-up examination after completed treatment for malignant neoplasm: Secondary | ICD-10-CM | POA: Diagnosis not present

## 2023-05-27 ENCOUNTER — Other Ambulatory Visit: Payer: Self-pay | Admitting: Nurse Practitioner

## 2023-05-27 DIAGNOSIS — I10 Essential (primary) hypertension: Secondary | ICD-10-CM

## 2023-05-27 DIAGNOSIS — E782 Mixed hyperlipidemia: Secondary | ICD-10-CM

## 2023-05-27 NOTE — Telephone Encounter (Signed)
Patient checking on message for samples of Trelegy. Patient phone number is [phone].

## 2023-05-31 MED ORDER — TRELEGY ELLIPTA 100-62.5-25 MCG/ACT IN AEPB: 1.0000 | INHALATION_SPRAY | Freq: Every day | RESPIRATORY_TRACT | Status: DC

## 2023-05-31 NOTE — Telephone Encounter (Signed)
3 Trelegy samples placed up front for patient to pick up.

## 2023-05-31 NOTE — Telephone Encounter (Signed)
Patient aware office closes @ noon. He will come by to pick samples.

## 2023-07-29 ENCOUNTER — Ambulatory Visit (INDEPENDENT_AMBULATORY_CARE_PROVIDER_SITE_OTHER): Payer: PPO | Admitting: Adult Health

## 2023-07-29 ENCOUNTER — Encounter: Payer: Self-pay | Admitting: Adult Health

## 2023-07-29 VITALS — BP 135/88 | HR 85 | Temp 98.2°F | Resp 21 | Ht 67.0 in | Wt 225.0 lb

## 2023-07-29 DIAGNOSIS — J069 Acute upper respiratory infection, unspecified: Secondary | ICD-10-CM | POA: Diagnosis not present

## 2023-07-29 DIAGNOSIS — E782 Mixed hyperlipidemia: Secondary | ICD-10-CM | POA: Diagnosis not present

## 2023-07-29 DIAGNOSIS — F17200 Nicotine dependence, unspecified, uncomplicated: Secondary | ICD-10-CM | POA: Diagnosis not present

## 2023-07-29 DIAGNOSIS — I10 Essential (primary) hypertension: Secondary | ICD-10-CM | POA: Diagnosis not present

## 2023-07-29 DIAGNOSIS — R197 Diarrhea, unspecified: Secondary | ICD-10-CM | POA: Diagnosis not present

## 2023-07-29 LAB — POC COVID19 BINAXNOW: SARS Coronavirus 2 Ag: NEGATIVE

## 2023-07-29 LAB — POCT INFLUENZA A/B
Influenza A, POC: NEGATIVE
Influenza B, POC: NEGATIVE

## 2023-07-29 MED ORDER — ALBUTEROL SULFATE HFA 108 (90 BASE) MCG/ACT IN AERS: 2.0000 | INHALATION_SPRAY | Freq: Four times a day (QID) | RESPIRATORY_TRACT | 0 refills | Status: DC | PRN

## 2023-07-29 MED ORDER — BENZONATATE 100 MG PO CAPS: 100.0000 mg | ORAL_CAPSULE | Freq: Three times a day (TID) | ORAL | 0 refills | Status: DC | PRN

## 2023-07-29 MED ORDER — LORATADINE 10 MG PO TABS
10.0000 mg | ORAL_TABLET | Freq: Every day | ORAL | Status: DC | PRN
Start: 2023-07-29 — End: 2024-06-01

## 2023-07-29 NOTE — Progress Notes (Signed)
 [facility]  Provider:  Jereld Serum DNP  Code Status:  DNR  Goals of Care:     07/29/2023    9:31 AM  Advanced Directives  Does Patient Have a Medical Advance Directive? Yes  Type of Advance Directive Out of facility DNR (pink MOST or yellow form)  Does patient want to make changes to medical advance directive? No - Patient declined  Pre-existing out of facility DNR order (yellow form or pink MOST form) Pink MOST form placed in chart (order not valid for inpatient use);Yellow form placed in chart (order not valid for inpatient use)     Chief Complaint  Patient presents with   Acute Visit    cold all week congetion coughing nothing helping    HPI: Patient is a 83 y.o. male seen today for an acute visit for  -   A week ago, nose is running bad with clear drainage, coughs a lot with whitish phlegm -    diarrhea X 2 days, has history of diarrhea but resolves on its own, mother has IBS, had 2 BM today -  Lives by himself -  no fever nor chills -  has 4 COVID-19  vaccines -   has SOB, with occasional wheezing -  smokes half pack/day  Essential hypertension - BP 135/88, takes Hydralazine , Losartan  and Metoprolol  tartrate  Mixed hyperlipidemia -  chol 117, TRG  118, LDL, 61, takes Atorvastatin   Past Medical History:  Diagnosis Date   A-fib (HCC)    Acute appendicitis with peritoneal abscess 01/28/2013   Allergy    seasonal    Anxiety    Arthritis    Bacteremia September 2015   Elevated PSA    Essential hypertension 04/16/2014   Heart murmur    childhood   Ileus, postoperative (HCC) 01/28/2013   Lung cancer (HCC) 2016   Obesity, morbid (HCC) 01/28/2013   Shortness of breath    Tobacco use disorder 01/28/2013    Past Surgical History:  Procedure Laterality Date   APPENDECTOMY     BACK SURGERY  1997, 2003   Dr.Apleton    CARPAL TUNNEL RELEASE     COLONOSCOPY     x 3   EYE SURGERY Left    muscle of the eye    KNEE SURGERY Bilateral    LAPAROSCOPIC  APPENDECTOMY N/A 01/25/2013   Procedure: APPENDECTOMY LAPAROSCOPIC;  Surgeon: Lynda Leos, MD;  Location: [facility] OR;  Service: General;  Laterality: N/A;   LYMPH NODE DISSECTION Left 04/26/2015   Procedure: LYMPH NODE DISSECTION;  Surgeon: Dallas KATHEE Jude, MD;  Location: [facility] OR;  Service: Thoracic;  Laterality: Left;   POLYPECTOMY     SHOULDER SURGERY Right 2014   Dr.Whitfield rotator cuff surgery   SKIN SURGERY Right 01/2019   Right Leg, cancerous area removed   SKIN SURGERY  01/01/2022   Right hand (wrist and between thumb and forefinger). [facility]   TEE WITHOUT CARDIOVERSION N/A 04/07/2014   Procedure: TRANSESOPHAGEAL ECHOCARDIOGRAM (TEE);  Surgeon: Oneil Parchment, MD;  Location: [facility] ENDOSCOPY;  Service: Cardiovascular;  Laterality: N/A;   VIDEO ASSISTED THORACOSCOPY (VATS)/WEDGE RESECTION Left 04/26/2015   Procedure: LEFT VIDEO ASSISTED THORACOSCOPY (VATS) WITH LEFT LOWER LOBECTOMY;  Surgeon: Dallas KATHEE Jude, MD;  Location: [facility] OR;  Service: Thoracic;  Laterality: Left;   VIDEO BRONCHOSCOPY N/A 04/26/2015   Procedure: VIDEO BRONCHOSCOPY;  Surgeon: Dallas KATHEE Jude, MD;  Location[facility] OR;  Service: Thoracic;  Laterality: N/A;    Allergies  Allergen Reactions  Asa [Aspirin ] Anaphylaxis and Swelling    Angioedema/Eyes and lip swelling   Naproxen Hives   Nsaids Hives   Tolmetin Hives    Outpatient Encounter Medications as of 07/29/2023  Medication Sig   atorvastatin  (LIPITOR) 10 MG tablet Take 1 tablet by mouth once daily   hydrALAZINE  (APRESOLINE ) 25 MG tablet Take 1 tablet (25 mg total) by mouth 3 (three) times daily.   losartan  (COZAAR ) 50 MG tablet Take 1 tablet (50 mg total) by mouth daily.   metoprolol  tartrate (LOPRESSOR ) 25 MG tablet Take 1/2 (one-half) tablet by mouth twice daily   Fluticasone-Umeclidin-Vilant (TRELEGY ELLIPTA ) 100-62.5-25 MCG/ACT AEPB Inhale 1 puff into the lungs daily. (Patient not taking: Reported on 07/29/2023)   No facility-administered  encounter medications on file as of 07/29/2023.    Review of Systems:  Review of Systems  Constitutional:  Negative for activity change, appetite change and fever.  HENT:  Negative for sore throat.   Eyes: Negative.   Respiratory:  Positive for cough, shortness of breath and wheezing.   Cardiovascular:  Negative for chest pain and leg swelling.  Gastrointestinal:  Negative for abdominal distention, diarrhea and vomiting.  Genitourinary:  Negative for dysuria, frequency and urgency.  Skin:  Negative for color change.  Neurological:  Negative for dizziness and headaches.  Psychiatric/Behavioral:  Negative for behavioral problems and sleep disturbance. The patient is not nervous/anxious.     Health Maintenance  Topic Date Due   COVID-19 Vaccine (4 - 2024-25 season) 03/17/2023   Medicare Annual Wellness (AWV)  01/14/2024   Colonoscopy  10/17/2026   DTaP/Tdap/Td (2 - Td or Tdap) 01/14/2031   Pneumonia Vaccine 70+ Years old  Completed   INFLUENZA VACCINE  Completed   Zoster Vaccines- Shingrix  Completed   HPV VACCINES  Aged Out   Hepatitis C Screening  Discontinued    Physical Exam: Vitals:   07/29/23 0924  BP: 135/88  Pulse: 85  Resp: (!) 21  Temp: 98.2 F (36.8 C)  SpO2: 92%  Weight: 225 lb (102.1 kg)  Height: 5' 7 (1.702 m)   Body mass index is 35.24 kg/m. Physical Exam Constitutional:      Appearance: Normal appearance. He is obese.  HENT:     Head: Normocephalic and atraumatic.     Mouth/Throat:     Mouth: Mucous membranes are moist.  Eyes:     Conjunctiva/sclera: Conjunctivae normal.  Cardiovascular:     Rate and Rhythm: Normal rate and regular rhythm.     Pulses: Normal pulses.     Heart sounds: Normal heart sounds.  Pulmonary:     Effort: Pulmonary effort is normal.     Breath sounds: Normal breath sounds.  Abdominal:     General: Bowel sounds are normal.     Palpations: Abdomen is soft.  Musculoskeletal:        General: No swelling. Normal range of  motion.     Cervical back: Normal range of motion.  Skin:    General: Skin is warm and dry.  Neurological:     General: No focal deficit present.     Mental Status: He is alert and oriented to person, place, and time.  Psychiatric:        Mood and Affect: Mood normal.        Behavior: Behavior normal.        Thought Content: Thought content normal.        Judgment: Judgment normal.     Labs reviewed: Basic Metabolic Panel:  Recent Labs    10/01/22 0803 04/08/23 0927  NA 142 141  K 4.5 4.7  CL 107 106  CO2 25 26  GLUCOSE 96 96  BUN 27* 18  CREATININE 1.05 1.02  CALCIUM  9.6 9.5   Liver Function Tests: Recent Labs    10/01/22 0803 04/08/23 0927  AST 18 14  ALT 16 12  BILITOT 0.5 0.6  PROT 7.2 7.0   No results for input(s): LIPASE, AMYLASE in the last 8760 hours. No results for input(s): AMMONIA in the last 8760 hours. CBC: Recent Labs    10/01/22 0803 04/08/23 0927  WBC 8.7 7.9  NEUTROABS 6,386 5,712  HGB 16.7 16.0  HCT 50.6* 49.3  MCV 96.6 98.0  PLT 221 249   Lipid Panel: Recent Labs    10/01/22 0803 04/08/23 0927  CHOL 131 117  HDL 39* 36*  LDLCALC 66 61  TRIG 182* 118  CHOLHDL 3.4 3.3   Lab Results  Component Value Date   HGBA1C 5.8 (H) 04/08/2023    Procedures since last visit: No results found.  Assessment/Plan  1. Viral upper respiratory illness (Primary) -  instructed to drink fluids and healthy meal - POC Influenza A/B negative - POC COVID-19 negative - albuterol  (VENTOLIN  HFA) 108 (90 Base) MCG/ACT inhaler; Inhale 2 puffs into the lungs every 6 (six) hours as needed for wheezing or shortness of breath.  Dispense: 8 g; Refill: 0 - benzonatate  (TESSALON  PERLES) 100 MG capsule; Take 1 capsule (100 mg total) by mouth 3 (three) times daily as needed for cough.  Dispense: 20 capsule; Refill: 0 - loratadine  (CLARITIN ) 10 MG tablet; Take 1 tablet (10 mg total) by mouth daily as needed for allergies.  2. Diarrhea, unspecified  type -  stated that he has a history of diarrheas that resolves on its own - drink Gatorade  3. Essential hypertension -  BP 135/88, stable -  continue Metoprolol  tartrate, Hydralazine  and Losartan   4. Mixed hyperlipidemia Lab Results  Component Value Date   CHOL 117 04/08/2023   HDL 36 (L) 04/08/2023   LDLCALC 61 04/08/2023   TRIG 118 04/08/2023   CHOLHDL 3.3 04/08/2023   -  at goal -  continue Atorvastatin   5. Tobacco use disorder -  counseled    Labs/tests ordered:   POC COVID-19 and Influenza A/B  Next appt:  10/02/2023

## 2023-07-31 ENCOUNTER — Telehealth: Payer: Self-pay | Admitting: Pulmonary Disease

## 2023-07-31 NOTE — Telephone Encounter (Signed)
 Patient states needs refill for Trelegy. Needs 3 month supply. Pharmacy is [facility]. Patient phone number is [phone].

## 2023-08-06 NOTE — Telephone Encounter (Signed)
Patient has already picked up rx. NFN

## 2023-08-21 ENCOUNTER — Other Ambulatory Visit: Payer: Self-pay | Admitting: Nurse Practitioner

## 2023-08-21 DIAGNOSIS — E782 Mixed hyperlipidemia: Secondary | ICD-10-CM

## 2023-08-21 DIAGNOSIS — I10 Essential (primary) hypertension: Secondary | ICD-10-CM

## 2023-09-19 ENCOUNTER — Other Ambulatory Visit: Payer: Self-pay | Admitting: Thoracic Surgery (Cardiothoracic Vascular Surgery)

## 2023-09-19 DIAGNOSIS — Z902 Acquired absence of lung [part of]: Secondary | ICD-10-CM

## 2023-09-25 ENCOUNTER — Telehealth: Payer: Self-pay | Admitting: *Deleted

## 2023-09-25 NOTE — Telephone Encounter (Signed)
 Patient has an appointment for Labs scheduled for Next Wednesday 3/19. No orders are in Epic.   Please place orders for appointment.

## 2023-09-26 NOTE — Telephone Encounter (Signed)
 Was not instructed to get labs prior to appt, he generally gets labs at his appt.

## 2023-09-26 NOTE — Telephone Encounter (Signed)
 Called and spoke with patient and canceled lab appointment.

## 2023-10-02 ENCOUNTER — Other Ambulatory Visit: Payer: PPO

## 2023-10-02 DIAGNOSIS — H2513 Age-related nuclear cataract, bilateral: Secondary | ICD-10-CM | POA: Diagnosis not present

## 2023-10-02 DIAGNOSIS — H0102A Squamous blepharitis right eye, upper and lower eyelids: Secondary | ICD-10-CM | POA: Diagnosis not present

## 2023-10-02 DIAGNOSIS — H0102B Squamous blepharitis left eye, upper and lower eyelids: Secondary | ICD-10-CM | POA: Diagnosis not present

## 2023-10-07 ENCOUNTER — Ambulatory Visit (INDEPENDENT_AMBULATORY_CARE_PROVIDER_SITE_OTHER): Payer: PPO | Admitting: Nurse Practitioner

## 2023-10-07 ENCOUNTER — Encounter: Payer: Self-pay | Admitting: Nurse Practitioner

## 2023-10-07 VITALS — BP 132/74 | HR 65 | Temp 98.3°F | Ht 67.0 in | Wt 230.2 lb

## 2023-10-07 DIAGNOSIS — E782 Mixed hyperlipidemia: Secondary | ICD-10-CM | POA: Diagnosis not present

## 2023-10-07 DIAGNOSIS — I872 Venous insufficiency (chronic) (peripheral): Secondary | ICD-10-CM | POA: Diagnosis not present

## 2023-10-07 DIAGNOSIS — I10 Essential (primary) hypertension: Secondary | ICD-10-CM | POA: Diagnosis not present

## 2023-10-07 DIAGNOSIS — F17200 Nicotine dependence, unspecified, uncomplicated: Secondary | ICD-10-CM

## 2023-10-07 DIAGNOSIS — R739 Hyperglycemia, unspecified: Secondary | ICD-10-CM
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
## 2023-10-08 ENCOUNTER — Encounter: Payer: Self-pay | Admitting: Nurse Practitioner

## 2023-10-08 LAB — COMPLETE METABOLIC PANEL WITH GFR
AG Ratio: 1.5 (calc) (ref 1.0–2.5)
ALT: 13 U/L (ref 9–46)
AST: 15 U/L (ref 10–35)
Albumin: 4.1 g/dL (ref 3.6–5.1)
Alkaline phosphatase (APISO): 83 U/L (ref 35–144)
BUN: 25 mg/dL (ref 7–25)
CO2: 23 mmol/L (ref 20–32)
Calcium: 9.1 mg/dL (ref 8.6–10.3)
Chloride: 108 mmol/L (ref 98–110)
Creat: 0.96 mg/dL (ref 0.70–1.22)
Globulin: 2.8 g/dL (ref 1.9–3.7)
Glucose, Bld: 82 mg/dL (ref 65–99)
Potassium: 4.5 mmol/L (ref 3.5–5.3)
Sodium: 139 mmol/L (ref 135–146)
Total Bilirubin: 0.4 mg/dL (ref 0.2–1.2)
Total Protein: 6.9 g/dL (ref 6.1–8.1)

## 2023-10-08 LAB — CBC WITH DIFFERENTIAL/PLATELET
Absolute Lymphocytes: 1408 {cells}/uL (ref 850–3900)
Absolute Monocytes: 680 {cells}/uL (ref 200–950)
Basophils Absolute: 40 {cells}/uL (ref 0–200)
Basophils Relative: 0.5 %
Eosinophils Absolute: 88 {cells}/uL (ref 15–500)
Eosinophils Relative: 1.1 %
HCT: 49.4 % (ref 38.5–50.0)
Hemoglobin: 16.3 g/dL (ref 13.2–17.1)
MCH: 31.7 pg (ref 27.0–33.0)
MCHC: 33 g/dL (ref 32.0–36.0)
MCV: 96.1 fL (ref 80.0–100.0)
MPV: 11.3 fL (ref 7.5–12.5)
Monocytes Relative: 8.5 %
Neutro Abs: 5784 {cells}/uL (ref 1500–7800)
Neutrophils Relative %: 72.3 %
Platelets: 242 10*3/uL (ref 140–400)
RBC: 5.14 10*6/uL (ref 4.20–5.80)
RDW: 13.2 % (ref 11.0–15.0)
Total Lymphocyte: 17.6 %
WBC: 8 10*3/uL (ref 3.8–10.8)

## 2023-10-08 LAB — HEMOGLOBIN A1C
Hgb A1c MFr Bld: 5.8 %{Hb} — ABNORMAL HIGH (ref <5.7)
Mean Plasma Glucose: 120 mg/dL
eAG (mmol/L): 6.6 mmol/L

## 2023-10-10 DIAGNOSIS — H2511 Age-related nuclear cataract, right eye: Secondary | ICD-10-CM | POA: Diagnosis not present

## 2023-10-10 DIAGNOSIS — H5703 Miosis: Secondary | ICD-10-CM | POA: Diagnosis not present

## 2023-10-21 ENCOUNTER — Ambulatory Visit
Admission: RE | Admit: 2023-10-21 | Discharge: 2023-10-21 | Disposition: A | Discharge status: Home / Self Care | Source: Ambulatory Visit | Attending: Thoracic Surgery (Cardiothoracic Vascular Surgery) | Admitting: Thoracic Surgery (Cardiothoracic Vascular Surgery)

## 2023-10-21 DIAGNOSIS — Z85118 Personal history of other malignant neoplasm of bronchus and lung: Secondary | ICD-10-CM | POA: Diagnosis not present

## 2023-10-21 DIAGNOSIS — Z902 Acquired absence of lung [part of]: Secondary | ICD-10-CM | POA: Diagnosis not present

## 2023-10-22 DIAGNOSIS — H2512 Age-related nuclear cataract, left eye: Secondary | ICD-10-CM | POA: Diagnosis not present

## 2023-10-25 ENCOUNTER — Telehealth: Admitting: Thoracic Surgery (Cardiothoracic Vascular Surgery)

## 2023-10-29 ENCOUNTER — Ambulatory Visit: Admitting: Thoracic Surgery (Cardiothoracic Vascular Surgery)

## 2023-10-31 DIAGNOSIS — H2512 Age-related nuclear cataract, left eye: Secondary | ICD-10-CM | POA: Diagnosis not present

## 2023-10-31 DIAGNOSIS — H5703 Miosis: Secondary | ICD-10-CM | POA: Diagnosis not present

## 2023-11-12 ENCOUNTER — Ambulatory Visit: Admitting: Thoracic Surgery (Cardiothoracic Vascular Surgery)

## 2023-11-22 ENCOUNTER — Ambulatory Visit
Attending: Thoracic Surgery (Cardiothoracic Vascular Surgery) | Admitting: Thoracic Surgery (Cardiothoracic Vascular Surgery)

## 2023-11-22 DIAGNOSIS — C3490 Malignant neoplasm of unspecified part of unspecified bronchus or lung: Secondary | ICD-10-CM

## 2023-11-22 NOTE — Progress Notes (Signed)
     [address]       [name] [address]             [phone]       Patient: Home Provider: Office Consent for Telemedicine visit obtained.  Today's visit was completed via a real-time telehealth (see specific modality noted below). The patient/authorized person provided oral consent at the time of the visit to engage in a telemedicine encounter with the present provider at [facility]. The patient/authorized person was informed of the potential benefits, limitations, and risks of telemedicine. The patient/authorized person expressed understanding that the laws that protect confidentiality also apply to telemedicine. The patient/authorized person acknowledged understanding that telemedicine does not provide emergency services and that he or she would need to call 911 or proceed to the nearest hospital for help if such a need arose.   Total time spent in the clinical discussion 10 minutes.  Telehealth Modality: Phone visit (audio only)  I had a telephone visit with [name] He is s/p LLLectomy for a sarcomatoid carcinoma in 2016.  He continues to smoke but says that he is trying to quit.  I reviewed his CT chest, and there is no evidence of recurrence or metastasis.  Based on his age, he does not meet criteria for ongoing lung cancer screening.  Additionally, given that he is over 5 years out from his original surgery, I do not think that he requires any more surveillance for recurrence or metastasis.  He will continue to follow-up with his primary care physician.  [name]

## 2023-11-27 DIAGNOSIS — L821 Other seborrheic keratosis: Secondary | ICD-10-CM | POA: Diagnosis not present

## 2023-11-27 DIAGNOSIS — D225 Melanocytic nevi of trunk: Secondary | ICD-10-CM | POA: Diagnosis not present

## 2023-11-27 DIAGNOSIS — L814 Other melanin hyperpigmentation: Secondary | ICD-10-CM | POA: Diagnosis not present

## 2023-11-27 DIAGNOSIS — Z08 Encounter for follow-up examination after completed treatment for malignant neoplasm: Secondary | ICD-10-CM | POA: Diagnosis not present

## 2023-11-27 DIAGNOSIS — Z85828 Personal history of other malignant neoplasm of skin: Secondary | ICD-10-CM | POA: Diagnosis not present

## 2023-11-27 DIAGNOSIS — D485 Neoplasm of uncertain behavior of skin: Secondary | ICD-10-CM | POA: Diagnosis not present

## 2023-12-17 ENCOUNTER — Other Ambulatory Visit: Payer: Self-pay | Admitting: Nurse Practitioner

## 2023-12-17 DIAGNOSIS — I10 Essential (primary) hypertension: Secondary | ICD-10-CM

## 2024-01-20 ENCOUNTER — Ambulatory Visit (INDEPENDENT_AMBULATORY_CARE_PROVIDER_SITE_OTHER): Payer: PPO | Admitting: Nurse Practitioner

## 2024-01-20 ENCOUNTER — Encounter: Payer: Self-pay | Admitting: Nurse Practitioner

## 2024-01-20 VITALS — BP 127/68 | HR 71 | Temp 97.6°F | Resp 18 | Ht 67.0 in | Wt 231.0 lb

## 2024-01-20 DIAGNOSIS — Z0000 Encounter for general adult medical examination without abnormal findings: Secondary | ICD-10-CM

## 2024-01-20 NOTE — Progress Notes (Signed)
 Subjective:   [name] is a 83 y.o. male who presents for Medicare Annual/Subsequent preventive examination.  Visit Complete: In person at [facility]  Cardiac Risk Factors include: advanced age (>20men, >29 women);family history of premature cardiovascular disease;sedentary lifestyle;smoking/ tobacco exposure;hypertension;dyslipidemia;male gender;obesity (BMI >30kg/m2)     Objective:    Today's Vitals   01/20/24 0942 01/20/24 0956  BP:  127/68  Pulse:  71  Resp:  18  Temp:  97.6 F (36.4 C)  SpO2:  91%  Weight:  231 lb (104.8 kg)  Height:  5' 7 (1.702 m)  PainSc: 0-No pain 0-No pain   Body mass index is 36.18 kg/m.     01/20/2024   10:03 AM 10/07/2023    8:40 AM 07/29/2023    9:31 AM 04/08/2023    8:05 AM 01/14/2023   10:15 AM 10/05/2022    8:49 AM 05/03/2022    8:45 AM  Advanced Directives  Does Patient Have a Medical Advance Directive? Yes Yes Yes Yes Yes Yes Yes  Type of Advance Directive Out of facility DNR (pink MOST or yellow form) Out of facility DNR (pink MOST or yellow form) Out of facility DNR (pink MOST or yellow form) Out of facility DNR (pink MOST or yellow form) Out of facility DNR (pink MOST or yellow form) Out of facility DNR (pink MOST or yellow form) Out of facility DNR (pink MOST or yellow form)  Does patient want to make changes to medical advance directive? No - Patient declined No - Patient declined No - Patient declined No - Patient declined No - Patient declined No - Patient declined No - Patient declined  Would patient like information on creating a medical advance directive?       No - Patient declined  Pre-existing out of facility DNR order (yellow form or pink MOST form) Pink MOST form placed in chart (order not valid for inpatient use);Yellow form placed in chart (order not valid for inpatient use)  Pink MOST form placed in chart (order not valid for inpatient use);Yellow form placed in chart (order not valid for inpatient use) Yellow form placed in  chart (order not valid for inpatient use);Pink MOST form placed in chart (order not valid for inpatient use) Yellow form placed in chart (order not valid for inpatient use) Pink MOST form placed in chart (order not valid for inpatient use);Yellow form placed in chart (order not valid for inpatient use)     Current Medications (verified) Outpatient Encounter Medications as of 01/20/2024  Medication Sig   albuterol  (VENTOLIN  HFA) 108 (90 Base) MCG/ACT inhaler Inhale 2 puffs into the lungs every 6 (six) hours as needed for wheezing or shortness of breath.   atorvastatin  (LIPITOR) 10 MG tablet Take 1 tablet by mouth once daily   benzonatate  (TESSALON  PERLES) 100 MG capsule Take 1 capsule (100 mg total) by mouth 3 (three) times daily as needed for cough.   Cyanocobalamin (VITAMIN B12 PO) Take 1 tablet by mouth daily in the afternoon.   Fluticasone-Umeclidin-Vilant (TRELEGY ELLIPTA ) 100-62.5-25 MCG/ACT AEPB Inhale 1 puff into the lungs daily.   hydrALAZINE  (APRESOLINE ) 25 MG tablet TAKE 1 TABLET BY MOUTH THREE TIMES DAILY   loratadine  (CLARITIN ) 10 MG tablet Take 1 tablet (10 mg total) by mouth daily as needed for allergies.   losartan  (COZAAR ) 50 MG tablet Take 1 tablet by mouth once daily   metoprolol  tartrate (LOPRESSOR ) 25 MG tablet Take 1/2 (one-half) tablet by mouth twice daily   ofloxacin (OCUFLOX) 0.3 %  ophthalmic solution Place 1 drop into the right eye 4 (four) times daily.   No facility-administered encounter medications on file as of 01/20/2024.    Allergies (verified) Asa [aspirin ], Naproxen, Nsaids, and Tolmetin   History: Past Medical History:  Diagnosis Date   A-fib (HCC)    Acute appendicitis with peritoneal abscess 01/28/2013   Allergy    seasonal    Anxiety    Arthritis    Bacteremia September 2015   Elevated PSA    Essential hypertension 04/16/2014   Heart murmur    childhood   Ileus, postoperative (HCC) 01/28/2013   Lung cancer (HCC) 2016   Obesity, morbid (HCC)  01/28/2013   Shortness of breath    Tobacco use disorder 01/28/2013   Past Surgical History:  Procedure Laterality Date   APPENDECTOMY     BACK SURGERY  1997, 2003   Dr.Apleton    CARPAL TUNNEL RELEASE     COLONOSCOPY     x 3   EYE SURGERY Left    muscle of the eye    KNEE SURGERY Bilateral    LAPAROSCOPIC APPENDECTOMY N/A 01/25/2013   Procedure: APPENDECTOMY LAPAROSCOPIC;  Surgeon: Lynda Leos, MD;  Location: [facility] OR;  Service: General;  Laterality: N/A;   LYMPH NODE DISSECTION Left 04/26/2015   Procedure: LYMPH NODE DISSECTION;  Surgeon: Dallas KATHEE Jude, MD;  Location: [facility] OR;  Service: Thoracic;  Laterality: Left;   POLYPECTOMY     SHOULDER SURGERY Right 2014   Dr.Whitfield rotator cuff surgery   SKIN SURGERY Right 01/2019   Right Leg, cancerous area removed   SKIN SURGERY  01/01/2022   Right hand (wrist and between thumb and forefinger). [facility]   TEE WITHOUT CARDIOVERSION N/A 04/07/2014   Procedure: TRANSESOPHAGEAL ECHOCARDIOGRAM (TEE);  Surgeon: Oneil Parchment, MD;  Location[facility] ENDOSCOPY;  Service: Cardiovascular;  Laterality: N/A;   VIDEO ASSISTED THORACOSCOPY (VATS)/WEDGE RESECTION Left 04/26/2015   Procedure: LEFT VIDEO ASSISTED THORACOSCOPY (VATS) WITH LEFT LOWER LOBECTOMY;  Surgeon: Dallas KATHEE Jude, MD;  Location: [facility] OR;  Service: Thoracic;  Laterality: Left;   VIDEO BRONCHOSCOPY N/A 04/26/2015   Procedure: VIDEO BRONCHOSCOPY;  Surgeon: Dallas KATHEE Jude, MD;  Location: [facility] OR;  Service: Thoracic;  Laterality: N/A;   Family History  Problem Relation Age of Onset   Irritable bowel syndrome Mother    Cancer Neg Hx    Lung disease Neg Hx    Colon cancer Neg Hx    Colon polyps Neg Hx    Esophageal cancer Neg Hx    Rectal cancer Neg Hx    Stomach cancer Neg Hx    Social History   Socioeconomic History   Marital status: Divorced    Spouse name: Not on file   Number of children: 0   Years of education: Not on file   Highest education level: Not on  file  Occupational History   Occupation: Retired  Tobacco Use   Smoking status: Every Day    Current packs/day: 0.50    Average packs/day: 0.5 packs/day for 65.5 years (32.8 ttl pk-yrs)    Types: Cigarettes, Cigars    Start date: 07/16/1958   Smokeless tobacco: Former    Types: Chew    Quit date: 07/17/2003  Vaping Use   Vaping status: Former   Quit date: 07/16/2008  Substance and Sexual Activity   Alcohol use: No    Alcohol/week: 0.0 standard drinks of alcohol   Drug use: No   Sexual activity: Not Currently  Other Topics  Concern   Not on file  Social History Narrative   No diet   Yes, eats/drinks things with caffeine    Divorced, married 1979   Lives in a house, one stories , one person, no pets   Current/past profession- [employer]   Patient exercises, golf 3 days weekly        Originally from [place]. Previously lived in [place] years and moved back to [place] in 1968. No international travel. He has traveled through multiple states traveling to [place]. 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 Unsure if he was exposed to any asbestos. No mold exposure. No pets currently. Previously has owned dogs. Parakeet as a child. No hot tub exposure. Enjoys golfing.   Social Drivers of Corporate investment banker Strain: Low Risk  (12/18/2017)   Overall Financial Resource Strain (CARDIA)    Difficulty of Paying Living Expenses: Not hard at all  Food Insecurity: No Food Insecurity (12/18/2017)   Hunger Vital Sign    Worried About Running Out of Food in the Last Year: Never true    Ran Out of Food in the Last Year: Never true  Transportation Needs: No Transportation Needs (12/18/2017)   PRAPARE - Administrator, Civil Service (Medical): No    Lack of Transportation (Non-Medical): No  Physical Activity: Inactive (12/18/2017)   Exercise Vital Sign    Days of Exercise per Week: 0 days    Minutes of Exercise per  Session: 0 min  Stress: No Stress Concern Present (12/18/2017)   [employer] of Occupational Health - Occupational Stress Questionnaire    Feeling of Stress : Only a little  Social Connections: Somewhat Isolated (12/18/2017)   Social Connection and Isolation Panel    Frequency of Communication with Friends and Family: More than three times a week    Frequency of Social Gatherings with Friends and Family: More than three times a week    Attends Religious Services: More than 4 times per year    Active Member of [employer] or Organizations: No    Attends Engineer, structural: Never    Marital Status: Divorced    Tobacco Counseling Ready to quit: Not Answered Counseling given: Not Answered   Clinical Intake:     Pain Score: 0-No pain     BMI - recorded: 36.18 Nutritional Status: BMI > 30  Obese Nutritional Risks: None Diabetes: No  How often do you need to have someone help you when you read instructions, pamphlets, or other written materials from your doctor or pharmacy?: 1 - Never         Activities of Daily Living    01/20/2024    9:42 AM 01/16/2024    5:47 PM  In your present state of health, do you have any difficulty performing the following activities:  Hearing? 0 0  Vision? 0 0  Difficulty concentrating or making decisions? 0 0  Walking or climbing stairs? 0 0  Dressing or bathing? 0 0  Doing errands, shopping? 0 0  Preparing Food and eating ? N N  Using the Toilet? N N  In the past six months, have you accidently leaked urine? N N  Do you have problems with loss of bowel control? N N  Managing your Medications? N N  Managing your Finances? N N  Housekeeping or managing your Housekeeping? Y Y    Patient Care Team: Easter Schinke K, NP  as PCP - General (Nurse Practitioner) Ivin Kocher, MD as Consulting Physician (Dermatology) Teressa Toribio SQUIBB, MD (Inactive) as Attending Physician (Gastroenterology)  Indicate any recent Medical Services you  may have received from other than [facility] providers in the past year (date may be approximate).     Assessment:   This is a routine wellness examination for [facility].  Hearing/Vision screen Hearing Screening - Comments:: Patient is having problems with hearing Vision Screening - Comments:: Patient has no problem with vision   Goals Addressed   None    Depression Screen    01/20/2024   10:04 AM 10/07/2023    8:40 AM 07/29/2023    9:33 AM 01/14/2023   10:12 AM 10/05/2022    9:42 AM 01/09/2022    9:27 AM 10/02/2021   10:02 AM  PHQ 2/9 Scores  PHQ - 2 Score 0 0 0 0 0 0 0    Fall Risk    01/20/2024   10:03 AM 01/16/2024    5:47 PM 10/07/2023    8:40 AM 07/29/2023    9:31 AM 04/08/2023    8:44 AM  Fall Risk   Falls in the past year? 0 0 0 0 0  Number falls in past yr: 0  0 0 0  Injury with Fall? 0  0 0 0  Risk for fall due to : No Fall Risks  Impaired mobility No Fall Risks No Fall Risks  Follow up Falls evaluation completed  Falls evaluation completed Falls evaluation completed Falls evaluation completed    MEDICARE RISK AT HOME: Medicare Risk at Home Any stairs in or around the home?: No If so, are there any without handrails?: No Home free of loose throw rugs in walkways, pet beds, electrical cords, etc?: No Adequate lighting in your home to reduce risk of falls?: Yes Life alert?: No Use of a cane, walker or w/c?: Yes Grab bars in the bathroom?: Yes Shower chair or bench in shower?: No Elevated toilet seat or a handicapped toilet?: No  TIMED UP AND GO:  Was the test performed?  No    Cognitive Function:    01/14/2023   10:18 AM 12/23/2018    9:07 AM 11/23/2016    9:09 AM 02/14/2016   10:00 AM 07/29/2014    9:24 AM  MMSE - Mini Mental State Exam  Orientation to time 5 5 5  5  5    Orientation to Place 5 5 5  5  5    Registration 3 3 3  3  3    Attention/ Calculation 5 5 4  5  5    Recall 3 3 2  3  3    Language- name 2 objects 2 2 2  2  2    Language- repeat 1 1 1 1 1   Language-  follow 3 step command 3 3 3  3  3    Language- read & follow direction 0 1 1  1  1    Language-read & follow direction-comments Did not read out loud      Write a sentence 1 1 1   0  1   Copy design 1 1 1  1  1    Total score 29 30 28  29  30       Data saved with a previous flowsheet row definition        01/20/2024   10:04 AM 01/09/2022    9:29 AM 01/03/2021    8:28 AM 12/29/2019    8:35 AM  6CIT Screen  What  Year? 0 points 0 points 0 points 0 points  What month? 0 points 0 points 0 points 0 points  What time? 0 points 0 points 0 points 0 points  Count back from 20 0 points 0 points 0 points 0 points  Months in reverse 0 points 0 points 2 points 2 points  Repeat phrase 0 points 0 points 0 points 6 points  Total Score 0 points 0 points 2 points 8 points    Immunizations Immunization History  Administered Date(s) Administered   Fluad Quad(high Dose 65+) 04/03/2021, 04/04/2022   Influenza, High Dose Seasonal PF 03/25/2017, 03/25/2018, 04/01/2023   Influenza,inj,quad, With Preservative 03/31/2016   Influenza-Unspecified 04/29/2014, 03/02/2019, 03/08/2020   PFIZER(Purple Top)SARS-COV-2 Vaccination 08/07/2019, 08/28/2019, 04/21/2020   PNEUMOCOCCAL CONJUGATE-20 01/02/2023   Pneumococcal Conjugate-13 02/14/2016   Pneumococcal-Unspecified 04/29/2014   RSV,unspecified 03/29/2023   Tdap 01/13/2021   Zoster Recombinant(Shingrix) 12/18/2017, 03/01/2018    TDAP status: Up to date  Flu Vaccine status: Up to date  Pneumococcal vaccine status: Up to date  Covid-19 vaccine status: Information provided on how to obtain vaccines.   Qualifies for Shingles Vaccine? Yes   Zostavax completed No   Shingrix Completed?: Yes  Screening Tests Health Maintenance  Topic Date Due   COVID-19 Vaccine (4 - 2024-25 season) 02/14/2024 (Originally 03/17/2023)   INFLUENZA VACCINE  02/14/2024   Medicare Annual Wellness (AWV)  01/19/2025   Colonoscopy  10/17/2026   DTaP/Tdap/Td (2 - Td or Tdap) 01/14/2031    Pneumococcal Vaccine: 50+ Years  Completed   Zoster Vaccines- Shingrix  Completed   Hepatitis B Vaccines  Aged Out   HPV VACCINES  Aged Out   Meningococcal B Vaccine  Aged Out   Hepatitis C Screening  Discontinued    Health Maintenance  There are no preventive care reminders to display for this patient.   Colorectal cancer screening: Type of screening: Colonoscopy. Completed 10/16/2021. Repeat every 5 years  Lung Cancer Screening: (Low Dose CT Chest recommended if Age 75-80 years, 20 pack-year currently smoking OR have quit w/in 15years.) does qualify.   Lung Cancer Screening Referral: completed  Additional Screening:  Hepatitis C Screening: does not qualify; Completed   Vision Screening: Recommended annual ophthalmology exams for early detection of glaucoma and other disorders of the eye. Is the patient up to date with their annual eye exam?  Yes  Who is the provider or what is the name of the office in which the patient attends annual eye exams? [facility] If pt is not established with a provider, would they like to be referred to a provider to establish care? No .   Dental Screening: Recommended annual dental exams for proper oral hygiene   Community Resource Referral / Chronic Care Management: CRR required this visit?  No   CCM required this visit?  No     Plan:     I have personally reviewed and noted the following in the patient's chart:   Medical and social history Use of alcohol, tobacco or illicit drugs  Current medications and supplements including opioid prescriptions. Patient is not currently taking opioid prescriptions. Functional ability and status Nutritional status Physical activity Advanced directives List of other physicians Hospitalizations, surgeries, and ER visits in previous 12 months Vitals Screenings to include cognitive, depression, and falls Referrals and appointments  In addition, I have reviewed and discussed with patient certain  preventive protocols, quality metrics, and best practice recommendations. A written personalized care plan for preventive services as well as general preventive health  recommendations were provided to patient.     Harlene MARLA An, NP   01/20/2024

## 2024-01-20 NOTE — Patient Instructions (Addendum)
  [name] , Thank you for taking time to come for your Medicare Wellness Visit. I appreciate your ongoing commitment to your health goals. Please review the following plan we discussed and let me know if I can assist you in the future.      This is a list of the screening recommended for you and due dates:  Health Maintenance  Topic Date Due   COVID-19 Vaccine (4 - 2024-25 season) 02/14/2024*   Flu Shot  02/14/2024   Medicare Annual Wellness Visit  01/19/2025   Colon Cancer Screening  10/17/2026   DTaP/Tdap/Td vaccine (2 - Td or Tdap) 01/14/2031   Pneumococcal Vaccine for age over 36  Completed   Zoster (Shingles) Vaccine  Completed   Hepatitis B Vaccine  Aged Out   HPV Vaccine  Aged Out   Meningitis B Vaccine  Aged Out   Hepatitis C Screening  Discontinued  *Topic was postponed. The date shown is not the original due date.

## 2024-02-15 ENCOUNTER — Other Ambulatory Visit: Payer: Self-pay | Admitting: Nurse Practitioner

## 2024-02-15 ENCOUNTER — Other Ambulatory Visit: Payer: Self-pay | Admitting: Pulmonary Disease

## 2024-02-15 DIAGNOSIS — E782 Mixed hyperlipidemia: Secondary | ICD-10-CM

## 2024-02-15 DIAGNOSIS — I10 Essential (primary) hypertension: Secondary | ICD-10-CM

## 2024-03-14 ENCOUNTER — Other Ambulatory Visit: Payer: Self-pay | Admitting: Pulmonary Disease

## 2024-04-10 ENCOUNTER — Encounter: Payer: Self-pay | Admitting: Nurse Practitioner

## 2024-04-10 ENCOUNTER — Ambulatory Visit (INDEPENDENT_AMBULATORY_CARE_PROVIDER_SITE_OTHER): Admitting: Nurse Practitioner

## 2024-04-10 ENCOUNTER — Other Ambulatory Visit: Payer: Self-pay | Admitting: Pulmonary Disease

## 2024-04-10 VITALS — BP 120/64 | HR 69 | Temp 96.9°F | Resp 17 | Ht 67.0 in | Wt 237.2 lb

## 2024-04-10 DIAGNOSIS — I872 Venous insufficiency (chronic) (peripheral): Secondary | ICD-10-CM | POA: Diagnosis not present

## 2024-04-10 DIAGNOSIS — M545 Low back pain, unspecified: Secondary | ICD-10-CM

## 2024-04-10 DIAGNOSIS — R0609 Other forms of dyspnea: Secondary | ICD-10-CM

## 2024-04-10 DIAGNOSIS — Z23 Encounter for immunization: Secondary | ICD-10-CM

## 2024-04-10 DIAGNOSIS — G8929 Other chronic pain: Secondary | ICD-10-CM

## 2024-04-10 DIAGNOSIS — R351 Nocturia: Secondary | ICD-10-CM

## 2024-04-10 DIAGNOSIS — R739 Hyperglycemia, unspecified: Secondary | ICD-10-CM

## 2024-04-10 DIAGNOSIS — E782 Mixed hyperlipidemia: Secondary | ICD-10-CM | POA: Diagnosis not present

## 2024-04-10 DIAGNOSIS — F17200 Nicotine dependence, unspecified, uncomplicated: Secondary | ICD-10-CM

## 2024-04-10 DIAGNOSIS — N401 Enlarged prostate with lower urinary tract symptoms: Secondary | ICD-10-CM

## 2024-04-10 DIAGNOSIS — R972 Elevated prostate specific antigen [PSA]: Secondary | ICD-10-CM

## 2024-04-10 DIAGNOSIS — I10 Essential (primary) hypertension: Secondary | ICD-10-CM | POA: Diagnosis not present

## 2024-04-10 MED ORDER — TAMSULOSIN HCL 0.4 MG PO CAPS: 0.4000 mg | ORAL_CAPSULE | Freq: Every day | ORAL | 3 refills | Status: DC

## 2024-04-10 NOTE — Progress Notes (Signed)
 Careteam: Patient Care Team: Caro Harlene POUR, NP as PCP - General (Nurse Practitioner) Ivin Kocher, MD as Consulting Physician (Dermatology) Teressa Toribio SQUIBB, MD (Inactive) as Attending Physician (Gastroenterology)  PLACE OF SERVICE:  [facility]  Advanced Directive information Does Patient Have a Medical Advance Directive?: Yes, Type of Advance Directive: Out of facility DNR (pink MOST or yellow form), Does patient want to make changes to medical advance directive?: No - Patient declined  Allergies  Allergen Reactions   Asa [Aspirin ] Anaphylaxis and Swelling    Angioedema/Eyes and lip swelling   Naproxen Hives   Nsaids Hives   Tolmetin Hives    Chief Complaint  Patient presents with   Medical Management of Chronic Issues    6 month follow up. Discuss the need for Influenza vaccine, and Covid Booster.    HPI:  Discussed the use of AI scribe software for clinical note transcription with the patient, who gave verbal consent to proceed.  History of Present Illness [name] is an 83 year old male who presents for a routine follow-up.  He has not experienced any significant changes since his last visit six months ago. He mentions feeling 'old' and notes birthday coming up.   He has a history of hyperglycemia and is currently working on dietary modifications, including reducing sugar intake. He is fasting today.   He continues to take Lipitor 10 mg daily for cholesterol management.  His blood pressure is well controlled with hydralazine  25 mg three times daily, losartan  50 mg daily, and metoprolol  25 mg twice a day.  He experiences dyspnea on exertion and has been on Trelegy for at least three years. No worsening of shortness of breath, cough, or congestion.  He reports weakness and difficulty with physical activity, stating he 'can't take another step' due to weakness, particularly in his back. He has a history of three back surgeries and an infectious  disease that affected his blood system, requiring six months of antibiotics treatment. He notes that his weakness began after this infection in 2015.  He has gained weight since his last visit and is not engaging in much physical activity, which he attributes to weakness. He is still able to mow the yard but finds weed eating particularly challenging.  He reports nocturia, getting up three to four times a night to urinate, with no changes in flow or frequency. He has a history of elevated PSA levels, but they have been stable recently.  He experiences venous insufficiency with persistent swelling. He reports poor sleep, waking multiple times at night to urinate. No dizziness or headaches.  He has a broken partial dental plate and no current dentist, as his previous dentist retired.    Review of Systems:  Review of Systems  Constitutional:  Negative for chills, fever and weight loss.  HENT:  Negative for tinnitus.   Respiratory:  Negative for cough, sputum production and shortness of breath.   Cardiovascular:  Negative for chest pain, palpitations and leg swelling.  Gastrointestinal:  Negative for abdominal pain, constipation, diarrhea and heartburn.  Genitourinary:  Negative for dysuria, frequency and urgency.  Musculoskeletal:  Negative for back pain, falls, joint pain and myalgias.  Skin: Negative.   Neurological:  Negative for dizziness and headaches.  Psychiatric/Behavioral:  Negative for depression and memory loss. The patient does not have insomnia.     Past Medical History:  Diagnosis Date   A-fib [facility])    Acute appendicitis with peritoneal abscess 01/28/2013   Allergy  seasonal    Anxiety    Arthritis    Bacteremia September 2015   Elevated PSA    Essential hypertension 04/16/2014   Heart murmur    childhood   Ileus, postoperative (HCC) 01/28/2013   Lung cancer (HCC) 2016   Obesity, morbid (HCC) 01/28/2013   Shortness of breath    Tobacco use disorder 01/28/2013    Past Surgical History:  Procedure Laterality Date   APPENDECTOMY     BACK SURGERY  1997, 2003   Dr.Apleton    CARPAL TUNNEL RELEASE     COLONOSCOPY     x 3   EYE SURGERY Left    muscle of the eye    KNEE SURGERY Bilateral    LAPAROSCOPIC APPENDECTOMY N/A 01/25/2013   Procedure: APPENDECTOMY LAPAROSCOPIC;  Surgeon: Lynda Leos, MD;  Location: [facility] OR;  Service: General;  Laterality: N/A;   LYMPH NODE DISSECTION Left 04/26/2015   Procedure: LYMPH NODE DISSECTION;  Surgeon: Dallas KATHEE Jude, MD;  Location: [facility] OR;  Service: Thoracic;  Laterality: Left;   POLYPECTOMY     SHOULDER SURGERY Right 2014   Dr.Whitfield rotator cuff surgery   SKIN SURGERY Right 01/2019   Right Leg, cancerous area removed   SKIN SURGERY  01/01/2022   Right hand (wrist and between thumb and forefinger). [facility]   TEE WITHOUT CARDIOVERSION N/A 04/07/2014   Procedure: TRANSESOPHAGEAL ECHOCARDIOGRAM (TEE);  Surgeon: Oneil Parchment, MD;  Location: [facility] ENDOSCOPY;  Service: Cardiovascular;  Laterality: N/A;   VIDEO ASSISTED THORACOSCOPY (VATS)/WEDGE RESECTION Left 04/26/2015   Procedure: LEFT VIDEO ASSISTED THORACOSCOPY (VATS) WITH LEFT LOWER LOBECTOMY;  Surgeon: Dallas KATHEE Jude, MD;  Location: [facility] OR;  Service: Thoracic;  Laterality: Left;   VIDEO BRONCHOSCOPY N/A 04/26/2015   Procedure: VIDEO BRONCHOSCOPY;  Surgeon: Dallas KATHEE Jude, MD;  Location: [facility] OR;  Service: Thoracic;  Laterality: N/A;   Social History:   reports that he has been smoking cigarettes and cigars. He started smoking about 65 years ago. He has a 32.9 pack-year smoking history. He quit smokeless tobacco use about 20 years ago.  His smokeless tobacco use included chew. He reports that he does not drink alcohol and does not use drugs.  Family History  Problem Relation Age of Onset   Irritable bowel syndrome Mother    Cancer Neg Hx    Lung disease Neg Hx    Colon cancer Neg Hx    Colon polyps Neg Hx    Esophageal cancer Neg Hx     Rectal cancer Neg Hx    Stomach cancer Neg Hx     Medications: Patient's Medications  New Prescriptions   No medications on file  Previous Medications   ALBUTEROL  (VENTOLIN  HFA) 108 (90 BASE) MCG/ACT INHALER    Inhale 2 puffs into the lungs every 6 (six) hours as needed for wheezing or shortness of breath.   ATORVASTATIN  (LIPITOR) 10 MG TABLET    Take 1 tablet by mouth once daily   BENZONATATE  (TESSALON  PERLES) 100 MG CAPSULE    Take 1 capsule (100 mg total) by mouth 3 (three) times daily as needed for cough.   CYANOCOBALAMIN (VITAMIN B12 PO)    Take 1 tablet by mouth daily in the afternoon.   HYDRALAZINE  (APRESOLINE ) 25 MG TABLET    TAKE 1 TABLET BY MOUTH THREE TIMES DAILY   LORATADINE  (CLARITIN ) 10 MG TABLET    Take 1 tablet (10 mg total) by mouth daily as needed for allergies.   LOSARTAN  (COZAAR ) 50 MG  TABLET    Take 1 tablet by mouth once daily   METOPROLOL  TARTRATE (LOPRESSOR ) 25 MG TABLET    Take 1/2 (one-half) tablet by mouth twice daily   OFLOXACIN (OCUFLOX) 0.3 % OPHTHALMIC SOLUTION    Place 1 drop into the right eye 4 (four) times daily.   TRELEGY ELLIPTA  100-62.5-25 MCG/ACT AEPB    INHALE 1 PUFF BY MOUTH ONCE DAILY . APPOINTMENT REQUIRED FOR FUTURE REFILLS  Modified Medications   No medications on file  Discontinued Medications   No medications on file    Physical Exam:  Vitals:   04/10/24 0830 04/10/24 0836  BP: (!) 108/58 120/64  Pulse: 69   Resp: 17   Temp: (!) 96.9 F (36.1 C)   SpO2: 91%   Weight: 237 lb 3.2 oz (107.6 kg)   Height: 5' 7 (1.702 m)    Body mass index is 37.15 kg/m. Wt Readings from Last 3 Encounters:  04/10/24 237 lb 3.2 oz (107.6 kg)  01/20/24 231 lb (104.8 kg)  10/07/23 230 lb 3.2 oz (104.4 kg)    Physical Exam Constitutional:      General: He is not in acute distress.    Appearance: He is well-developed. He is not diaphoretic.  HENT:     Head: Normocephalic and atraumatic.     Right Ear: External ear normal.     Left Ear:  External ear normal.     Mouth/Throat:     Pharynx: No oropharyngeal exudate.  Eyes:     Conjunctiva/sclera: Conjunctivae normal.     Pupils: Pupils are equal, round, and reactive to light.  Cardiovascular:     Rate and Rhythm: Normal rate and regular rhythm.     Heart sounds: Normal heart sounds.  Pulmonary:     Effort: Pulmonary effort is normal.     Breath sounds: Normal breath sounds.  Abdominal:     General: Bowel sounds are normal.     Palpations: Abdomen is soft.  Musculoskeletal:        General: No tenderness.     Cervical back: Normal range of motion and neck supple.     Right lower leg: No edema.     Left lower leg: No edema.  Skin:    General: Skin is warm and dry.  Neurological:     Mental Status: He is alert and oriented to person, place, and time.     Labs reviewed: Basic Metabolic Panel: Recent Labs    10/07/23 1020  NA 139  K 4.5  CL 108  CO2 23  GLUCOSE 82  BUN 25  CREATININE 0.96  CALCIUM  9.1   Liver Function Tests: Recent Labs    10/07/23 1020  AST 15  ALT 13  BILITOT 0.4  PROT 6.9   No results for input(s): LIPASE, AMYLASE in the last 8760 hours. No results for input(s): AMMONIA in the last 8760 hours. CBC: Recent Labs    10/07/23 1020  WBC 8.0  NEUTROABS 5,784  HGB 16.3  HCT 49.4  MCV 96.1  PLT 242   Lipid Panel: No results for input(s): CHOL, HDL, LDLCALC, TRIG, CHOLHDL, LDLDIRECT in the last 8760 hours. TSH: No results for input(s): TSH in the last 8760 hours. A1C: Lab Results  Component Value Date   HGBA1C 5.8 (H) 10/07/2023     Assessment/Plan  Immunization due -     Flu vaccine HIGH DOSE PF(Fluzone Trivalent)  Mixed hyperlipidemia Assessment & Plan: Continues lipitor and dietary modification.   Orders: -  Lipid panel  Essential hypertension Assessment & Plan: Blood pressure well controlled, goal bp <140/90 Continue current medications and dietary modifications follow metabolic  panel  Orders: -     CBC with Differential/Platelet -     Comprehensive metabolic panel with GFR  Tobacco use disorder Assessment & Plan: Encouraged cessation.    Obesity, morbid (HCC) Assessment & Plan: -education provided on healthy weight loss through increase in physical activity and proper nutrition    Hyperglycemia Assessment & Plan: Continue lifestyle modifications and dietary changes.   Orders: -     Hemoglobin A1c  Venous insufficiency of both lower extremities Assessment & Plan: -encouraged to elevate legs above level of heart as tolerates, low sodium diet, compression hose as tolerates (on in am, off in pm)   DOE (dyspnea on exertion) Assessment & Plan: Ongoing, followed by pulmonary Continues on trelegy Encourage smoking cessation    Chronic bilateral low back pain without sciatica Assessment & Plan: Ongoing with weakness.  Interested in PT for strength training   Orders: -     Ambulatory referral to Physical Therapy  Elevated PSA -     PSA  Benign prostatic hyperplasia with nocturia -     Tamsulosin  HCl; Take 1 capsule (0.4 mg total) by mouth at bedtime.  Dispense: 30 capsule; Refill: 3  Has been getting up at night 3-4 times for year.  Add flomax       Return in about 6 months (around 10/08/2024) for routine follow up.:  [name] [facility] [phone]

## 2024-04-10 NOTE — Assessment & Plan Note (Signed)
 Continue lifestyle modifications and dietary changes.

## 2024-04-10 NOTE — Assessment & Plan Note (Signed)
 Blood pressure well controlled, goal bp <140/90 Continue current medications and dietary modifications follow metabolic panel

## 2024-04-10 NOTE — Assessment & Plan Note (Signed)
-  encouraged to elevate legs above level of heart as tolerates, low sodium diet, compression hose as tolerates (on in am, off in pm)

## 2024-04-10 NOTE — Assessment & Plan Note (Signed)
 Ongoing, followed by pulmonary Continues on trelegy Encourage smoking cessation

## 2024-04-10 NOTE — Assessment & Plan Note (Addendum)
 Encouraged cessation.

## 2024-04-10 NOTE — Assessment & Plan Note (Signed)
 Ongoing with weakness.  Interested in PT for strength training

## 2024-04-10 NOTE — Assessment & Plan Note (Signed)
 Continues lipitor and dietary modification.

## 2024-04-10 NOTE — Assessment & Plan Note (Signed)
-  education provided on healthy weight loss through increase in physical activity and proper nutrition

## 2024-04-11 LAB — CBC WITH DIFFERENTIAL/PLATELET
Absolute Lymphocytes: 1367 {cells}/uL (ref 850–3900)
Absolute Monocytes: 727 {cells}/uL (ref 200–950)
Basophils Absolute: 47 {cells}/uL (ref 0–200)
Basophils Relative: 0.6 %
Eosinophils Absolute: 134 {cells}/uL (ref 15–500)
Eosinophils Relative: 1.7 %
HCT: 51.1 % — ABNORMAL HIGH (ref 38.5–50.0)
Hemoglobin: 16.5 g/dL (ref 13.2–17.1)
MCH: 32.5 pg (ref 27.0–33.0)
MCHC: 32.3 g/dL (ref 32.0–36.0)
MCV: 100.8 fL — ABNORMAL HIGH (ref 80.0–100.0)
MPV: 11.2 fL (ref 7.5–12.5)
Monocytes Relative: 9.2 %
Neutro Abs: 5625 {cells}/uL (ref 1500–7800)
Neutrophils Relative %: 71.2 %
Platelets: 219 Thousand/uL (ref 140–400)
RBC: 5.07 Million/uL (ref 4.20–5.80)
RDW: 13.1 % (ref 11.0–15.0)
Total Lymphocyte: 17.3 %
WBC: 7.9 Thousand/uL (ref 3.8–10.8)

## 2024-04-11 LAB — COMPREHENSIVE METABOLIC PANEL WITH GFR
AG Ratio: 1.5 (calc) (ref 1.0–2.5)
ALT: 15 U/L (ref 9–46)
AST: 19 U/L (ref 10–35)
Albumin: 4.1 g/dL (ref 3.6–5.1)
Alkaline phosphatase (APISO): 75 U/L (ref 35–144)
BUN: 23 mg/dL (ref 7–25)
CO2: 25 mmol/L (ref 20–32)
Calcium: 9.1 mg/dL (ref 8.6–10.3)
Chloride: 108 mmol/L (ref 98–110)
Creat: 0.97 mg/dL (ref 0.70–1.22)
Globulin: 2.8 g/dL (ref 1.9–3.7)
Glucose, Bld: 90 mg/dL (ref 65–99)
Potassium: 4.5 mmol/L (ref 3.5–5.3)
Sodium: 139 mmol/L (ref 135–146)
Total Bilirubin: 0.6 mg/dL (ref 0.2–1.2)
Total Protein: 6.9 g/dL (ref 6.1–8.1)
eGFR: 79 mL/min/1.73m2 (ref >=60)

## 2024-04-11 LAB — HEMOGLOBIN A1C
Hgb A1c MFr Bld: 5.8 % — ABNORMAL HIGH (ref <5.7)
Mean Plasma Glucose: 120 mg/dL
eAG (mmol/L): 6.6 mmol/L

## 2024-04-11 LAB — LIPID PANEL
Cholesterol: 126 mg/dL (ref <200)
HDL: 40 mg/dL (ref >=40)
LDL Cholesterol (Calc): 65 mg/dL
Non-HDL Cholesterol (Calc): 86 mg/dL (ref <130)
Total CHOL/HDL Ratio: 3.2 (calc) (ref <5.0)
Triglycerides: 126 mg/dL (ref <150)

## 2024-04-11 LAB — PSA: PSA: 4.98 ng/mL — ABNORMAL HIGH (ref <=4.00)

## 2024-04-13 ENCOUNTER — Telehealth: Payer: Self-pay | Admitting: Nurse Practitioner

## 2024-04-13 NOTE — Telephone Encounter (Signed)
 Please advise.....  Copied from CRM [id number]. Topic: General - Other >> Apr 10, 2024  2:54 PM [name] wrote: Reason for CRM: Patient wants to let provider know that the place he wants to do the PT is the [facility] center on [address] [facility] [address].

## 2024-04-14 ENCOUNTER — Ambulatory Visit: Payer: Self-pay | Admitting: Nurse Practitioner

## 2024-04-23 ENCOUNTER — Telehealth: Payer: Self-pay | Admitting: *Deleted

## 2024-04-23 ENCOUNTER — Telehealth: Payer: Self-pay | Admitting: Nurse Practitioner

## 2024-04-23 ENCOUNTER — Telehealth: Payer: Self-pay

## 2024-04-23 NOTE — Telephone Encounter (Signed)
 Will fill out Placard then give to front desk staff.Patient to fill out the top part then take to [facility].

## 2024-04-23 NOTE — Telephone Encounter (Signed)
 Patient notified and agreed.

## 2024-04-23 NOTE — Telephone Encounter (Signed)
 Copied from CRM [id number]. Topic: Clinical - Medication Question >> Apr 23, 2024 10:03 AM Carrielelia G wrote: Reason for CRM: Is this a supplement that I can take. Please advise.  Morning Kick by Riva Her  Morning Kick is a strawberry lemonade-flavored superfood and greens powder by [employer] designed to support daily wellness, energy, and digestion, featuring ingredients like probiotics, Ashwagandha for stress management, and collagen for joint and muscle health. It is a sugar-free drink mix that supports mental clarity, immune defense, and overall balance for a healthy start to the day.

## 2024-04-23 NOTE — Telephone Encounter (Signed)
 Patient dropped off Handicapped placard form asking if it can be made out for a permament placard.. need a call back to pick it up.

## 2024-04-23 NOTE — Telephone Encounter (Signed)
 Copied from CRM #[id number]. Topic: General - Other >> Apr 23, 2024 10:09 AM Carrielelia G wrote: Reason for CRM: Patient is aware that his PCP will be out of the office until 10/20 but he would like to know if someone else can fill out his document (disability placard)

## 2024-04-23 NOTE — Telephone Encounter (Signed)
 Im covering for [employer] today.Looks ike these are all natural supplement but only concern is Ashwagandha tends to interact with blood pressure medication since it also lowers blood pressure and Heart rate resulting to feelings of fatigue ,dizziness or lightheadedness.

## 2024-04-23 NOTE — Telephone Encounter (Signed)
 Thank You.

## 2024-04-23 NOTE — Telephone Encounter (Signed)
 Form completed and signed.Patient to sign applicant section then copy and give original to patient.

## 2024-04-23 NOTE — Telephone Encounter (Signed)
 Received Handicap Placard and placed in [name]'s folder to review and sign since Harlene is out of office.

## 2024-04-23 NOTE — Telephone Encounter (Signed)
 Placard given to the front desk to copy and call patient to pick up per [facility]

## 2024-04-23 NOTE — Telephone Encounter (Signed)
 Forms signed and given to Admin. staff to call patient.

## 2024-04-23 NOTE — Telephone Encounter (Signed)
 Forwarded to [employer] due to [employer].

## 2024-04-23 NOTE — Telephone Encounter (Signed)
 Patient stated that he will drop it off for Dinah to fill out and sign and will pick it up when finished.

## 2024-04-28 ENCOUNTER — Other Ambulatory Visit: Payer: Self-pay

## 2024-04-28 ENCOUNTER — Ambulatory Visit: Attending: Nurse Practitioner

## 2024-04-28 DIAGNOSIS — M545 Low back pain, unspecified: Secondary | ICD-10-CM | POA: Diagnosis not present

## 2024-04-28 DIAGNOSIS — M6281 Muscle weakness (generalized): Secondary | ICD-10-CM | POA: Diagnosis not present

## 2024-04-28 DIAGNOSIS — G8929 Other chronic pain: Secondary | ICD-10-CM | POA: Diagnosis not present

## 2024-04-28 DIAGNOSIS — R6889 Other general symptoms and signs: Secondary | ICD-10-CM | POA: Diagnosis not present

## 2024-04-28 NOTE — Therapy (Signed)
 OUTPATIENT PHYSICAL THERAPY THORACOLUMBAR EVALUATION   Patient Name: [name] MRN: [id number] DOB:Jul 10, 1941, 83 y.o., male Today's Date: 04/28/2024  END OF SESSION:  PT End of Session - 04/28/24 0936     Visit Number 1    Number of Visits 16    Date for Recertification  06/28/24    Authorization Type [employer]    PT Start Time 0935    PT Stop Time 1015    PT Time Calculation (min) 40 min    Activity Tolerance Patient tolerated treatment well          Past Medical History:  Diagnosis Date   A-fib (HCC)    Acute appendicitis with peritoneal abscess 01/28/2013   Allergy    seasonal    Anxiety    Arthritis    Bacteremia September 2015   Elevated PSA    Essential hypertension 04/16/2014   Heart murmur    childhood   Ileus, postoperative (HCC) 01/28/2013   Lung cancer (HCC) 2016   Obesity, morbid (HCC) 01/28/2013   Shortness of breath    Tobacco use disorder 01/28/2013   Past Surgical History:  Procedure Laterality Date   APPENDECTOMY     BACK SURGERY  1997, 2003   Dr.Apleton    CARPAL TUNNEL RELEASE     COLONOSCOPY     x 3   EYE SURGERY Left    muscle of the eye    KNEE SURGERY Bilateral    LAPAROSCOPIC APPENDECTOMY N/A 01/25/2013   Procedure: APPENDECTOMY LAPAROSCOPIC;  Surgeon: Lynda Leos, MD;  Location: [facility] OR;  Service: General;  Laterality: N/A;   LYMPH NODE DISSECTION Left 04/26/2015   Procedure: LYMPH NODE DISSECTION;  Surgeon: Dallas KATHEE Jude, MD;  Location: [facility] OR;  Service: Thoracic;  Laterality: Left;   POLYPECTOMY     SHOULDER SURGERY Right 2014   Dr.Whitfield rotator cuff surgery   SKIN SURGERY Right 01/2019   Right Leg, cancerous area removed   SKIN SURGERY  01/01/2022   Right hand (wrist and between thumb and forefinger). [facility]   TEE WITHOUT CARDIOVERSION N/A 04/07/2014   Procedure: TRANSESOPHAGEAL ECHOCARDIOGRAM (TEE);  Surgeon: Oneil Parchment, MD;  Location: [facility] ENDOSCOPY;  Service: Cardiovascular;  Laterality:  N/A;   VIDEO ASSISTED THORACOSCOPY (VATS)/WEDGE RESECTION Left 04/26/2015   Procedure: LEFT VIDEO ASSISTED THORACOSCOPY (VATS) WITH LEFT LOWER LOBECTOMY;  Surgeon: Dallas KATHEE Jude, MD;  Location: [facility] OR;  Service: Thoracic;  Laterality: Left;   VIDEO BRONCHOSCOPY N/A 04/26/2015   Procedure: VIDEO BRONCHOSCOPY;  Surgeon: Dallas KATHEE Jude, MD;  Location: [facility] OR;  Service: Thoracic;  Laterality: N/A;   Patient Active Problem List   Diagnosis Date Noted   Venous insufficiency of both lower extremities 04/10/2024   Hyperglycemia 04/10/2024   Mixed hyperlipidemia 04/10/2024   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/26/2015   Multiple lung nodules on CT 03/02/2015   DOE (dyspnea on exertion) 04/16/2014   Physical deconditioning 04/16/2014   Essential hypertension 04/16/2014   Leg swelling 04/16/2014   Back pain 04/01/2014   Transaminitis 04/01/2014   Thrombocytopenia, unspecified 04/01/2014   Tobacco use disorder 01/28/2013   Obesity, morbid (HCC) 01/28/2013   IMPACTED CERUMEN 01/29/2008   GENITAL HERPES, 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    PCP: Caro Harlene POUR, NP Ref Provider (PCP)   REFERRING PROVIDER: Caro Harlene POUR, NP Ref Provider (PCP)   REFERRING DIAG: M54.50,G89.29 (ICD-10-CM) - Chronic bilateral low back pain without sciatica  Rationale for Evaluation and Treatment: Rehabilitation  THERAPY DIAG:  Chronic bilateral  low back pain without sciatica  Muscle weakness (generalized)  Decreased activity tolerance  ONSET DATE: chronic pain; >3 months of experiencing pain  SUBJECTIVE:                                                                                                                                                                                           SUBJECTIVE STATEMENT: Not in so much pain, lower back feels weak. Can't walk far bc of weakness from aching. Comfortable walking limit appx 40 ft. 10 minutes max of standing.   PERTINENT HISTORY:  Pt has extremely low activity  tolerance when performing basic ADL's  PAIN:  0/10C, 5/10W Are you having pain? Yes: NPRS scale: 10 Pain location: BL lumbar spine Pain description: achy, dull Aggravating factors: too much  Relieving factors: rest, sitting  PRECAUTIONS: None  RED FLAGS: None   WEIGHT BEARING RESTRICTIONS: No  FALLS:  Has patient fallen in last 6 months? Yes. Number of falls 1    PLOF: Independent with basic ADLs  PATIENT GOALS: being able to walk, being able to do ADL (50%)   OBJECTIVE:  Note: Objective measures were completed at Evaluation unless otherwise noted.    PATIENT SURVEYS:  Modified ODI  Score: 13  COGNITION: Overall cognitive status: Within functional limits for tasks assessed     SENSATION: WFL    POSTURE: rounded shoulders, forward head, and increased lumbar lordosis  PALPATION: TTP BL lumbar extensors  LUMBAR ROM:   AROM eval  Flexion WFL some p!  Extension 50% lim p!  Right lateral flexion 25% lim  Left lateral flexion 25% lim  Right rotation 25% lim  Left rotation 25% lim   (Blank rows = not tested)  LOWER EXTREMITY ROM:     Active  Right eval Left eval  Hip flexion [facility]  Hip extension    Hip abduction [facility]  Hip adduction    Hip internal rotation    Hip external rotation    Knee flexion [facility] WFL  Knee extension [facility]  Ankle dorsiflexion    Ankle plantarflexion    Ankle inversion    Ankle eversion     (Blank rows = not tested)  LOWER EXTREMITY MMT:    MMT Right eval Left eval  Hip flexion 4- 4-  Hip extension    Hip abduction 3+ 3+  Hip adduction (seated modified) 4 4  Hip internal rotation    Hip external rotation    Knee flexion 4 4  Knee extension 4+ 4+  Ankle dorsiflexion    Ankle plantarflexion    Ankle inversion    Ankle eversion     (  Blank rows = not tested)  LUMBAR SPECIAL TESTS:  none     TREATMENT DATE:   TREATMENT 04/28/2024:  Therapeutic Exercise: supine LTR x8 Supine PPT x8x3s Patient  educated on HEP, POC, prognosis, and relevant tissues/anatomy.                                                                                                                                      PATIENT EDUCATION:  Education details: HEP Person educated: Patient Education method: Programmer, multimedia, Facilities manager, Actor cues, Verbal cues, and Handouts Education comprehension: verbalized understanding and returned demonstration  HOME EXERCISE PROGRAM: Access Code: [email or web address] URL: [email or web address] Date: 04/28/2024 Prepared by: Washington Scot  Exercises - Supine Lower Trunk Rotation  - 2 x daily - 5 x weekly - 2 sets - 8 reps - Supine Posterior Pelvic Tilt  - 2 x daily - 5 x weekly - 2 sets - 8 reps - 3 hold  ASSESSMENT:  CLINICAL IMPRESSION: Patient is a 83 year old male who presents with chronic BL LBP without sciatica. Patient presents with deficits in: functional activity tolerance, standing>10 minutes, walking>40 ft, balance, and general lower body and core weakness. As a result, the patient would benefit from skilled PT to address aforementioned deficits via plan below.   OBJECTIVE IMPAIRMENTS: decreased activity tolerance, decreased balance, decreased mobility, difficulty walking, decreased strength, and pain.   PERSONAL FACTORS: Age, Fitness, and 3+ comorbidities:   are also affecting patient's functional outcome.   REHAB POTENTIAL: Fair    CLINICAL DECISION MAKING: Evolving/moderate complexity  EVALUATION COMPLEXITY: Moderate   GOALS: Goals reviewed with patient? No  SHORT TERM GOALS: Target date: 05/19/2024    Patient will be able to do ADL at least 60% capacity to demonstrate improvements in lower body activity tolerance needed for ADL's  Baseline: 50% Goal status: INITIAL  2.  Patient will be able to stand at least 15 minutes without significant increases in pain to demonstrate improvements in lower body activity tolerance needed for  ADL's  Baseline: 10 min Goal status: INITIAL   LONG TERM GOALS: Target date: 06/28/24  Patient will demonstrate 100% HEP compliance to show independence with self-management of condition  Baseline: 0% Goal status: INITIAL  2.  Patient will demonstrate a 5 point improvement in ODI to show improvements in ADL completion and overall QOL   Baseline: 13 Goal status: INITIAL  3.  Patient will decrease worst pain to 2/10 at most to improve ADL completion and overall QOL  Baseline: 5/10 Goal status: INITIAL  4.  Patient will be able to walk at least 120 ft to demonstrate improvements in lower body tolerance needed for community ambulation  Baseline: 40 ft Goal status: INITIAL   PLAN:  PT FREQUENCY: 1-2x/week  PT DURATION: 8 weeks  PLANNED INTERVENTIONS: 97110-Therapeutic exercises, 97530- Therapeutic activity, [id number]- Neuromuscular re-education, 97535- Self Care, [id number]- Manual therapy, and Patient/Family education.  PLAN FOR NEXT SESSION: HEP  assessment and progression, symptom modulation, and loading (isolated and/or functional). Manual therapy, aerobic, gait, and NME training as needed.     Washington Odessia Scot  PT, DPT

## 2024-05-05 ENCOUNTER — Ambulatory Visit

## 2024-05-05 DIAGNOSIS — M6281 Muscle weakness (generalized): Secondary | ICD-10-CM

## 2024-05-05 DIAGNOSIS — R6889 Other general symptoms and signs: Secondary | ICD-10-CM

## 2024-05-05 DIAGNOSIS — M545 Low back pain, unspecified: Secondary | ICD-10-CM

## 2024-05-05 NOTE — Therapy (Signed)
 OUTPATIENT PHYSICAL THERAPY THORACOLUMBAR EVALUATION   Patient Name: [name] MRN: [id number] DOB:09/09/1940, 83 y.o., male Today's Date: 05/05/2024  END OF SESSION:  PT End of Session - 05/05/24 1534     Visit Number 2    Number of Visits 16    Date for Recertification  06/28/24    Authorization Type [employer]    PT Start Time 1445    PT Stop Time 1530    PT Time Calculation (min) 45 min    Activity Tolerance Patient tolerated treatment well           Past Medical History:  Diagnosis Date   A-fib (HCC)    Acute appendicitis with peritoneal abscess 01/28/2013   Allergy    seasonal    Anxiety    Arthritis    Bacteremia September 2015   Elevated PSA    Essential hypertension 04/16/2014   Heart murmur    childhood   Ileus, postoperative (HCC) 01/28/2013   Lung cancer (HCC) 2016   Obesity, morbid (HCC) 01/28/2013   Shortness of breath    Tobacco use disorder 01/28/2013   Past Surgical History:  Procedure Laterality Date   APPENDECTOMY     BACK SURGERY  1997, 2003   Dr.Apleton    CARPAL TUNNEL RELEASE     COLONOSCOPY     x 3   EYE SURGERY Left    muscle of the eye    KNEE SURGERY Bilateral    LAPAROSCOPIC APPENDECTOMY N/A 01/25/2013   Procedure: APPENDECTOMY LAPAROSCOPIC;  Surgeon: Lynda Leos, MD;  Location: [facility] OR;  Service: General;  Laterality: N/A;   LYMPH NODE DISSECTION Left 04/26/2015   Procedure: LYMPH NODE DISSECTION;  Surgeon: Dallas KATHEE Jude, MD;  Location: [facility] OR;  Service: Thoracic;  Laterality: Left;   POLYPECTOMY     SHOULDER SURGERY Right 2014   Dr.Whitfield rotator cuff surgery   SKIN SURGERY Right 01/2019   Right Leg, cancerous area removed   SKIN SURGERY  01/01/2022   Right hand (wrist and between thumb and forefinger). [facility]erm[facility]   TEE WITHOUT CARDIOVERSION N/A 04/07/2014   Procedure: TRANSESOPHAGEAL ECHOCARDIOGRAM (TEE);  Surgeon: Oneil Parchment, MD;  Location: [facility] ENDOSCOPY;  Service: Cardiovascular;   Laterality: N/A;   VIDEO ASSISTED THORACOSCOPY (VATS)/WEDGE RESECTION Left 04/26/2015   Procedure: LEFT VIDEO ASSISTED THORACOSCOPY (VATS) WITH LEFT LOWER LOBECTOMY;  Surgeon: Dallas KATHEE Jude, MD;  Location: [facility] OR;  Service: Thoracic;  Laterality: Left;   VIDEO BRONCHOSCOPY N/A 04/26/2015   Procedure: VIDEO BRONCHOSCOPY;  Surgeon: Dallas KATHEE Jude, MD;  Location: [facility] OR;  Service: Thoracic;  Laterality: N/A;   Patient Active Problem List   Diagnosis Date Noted   Venous insufficiency of both lower extremities 04/10/2024   Hyperglycemia 04/10/2024   Mixed hyperlipidemia 04/10/2024   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/26/2015   Multiple lung nodules on CT 03/02/2015   DOE (dyspnea on exertion) 04/16/2014   Physical deconditioning 04/16/2014   Essential hypertension 04/16/2014   Leg swelling 04/16/2014   Back pain 04/01/2014   Transaminitis 04/01/2014   Thrombocytopenia, unspecified 04/01/2014   Tobacco use disorder 01/28/2013   Obesity, morbid (HCC) 01/28/2013   IMPACTED CERUMEN 01/29/2008   GENITAL HERPES, 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    PCP: Caro Harlene POUR, NP Ref Provider (PCP)   REFERRING PROVIDER: Caro Harlene POUR, NP Ref Provider (PCP)   REFERRING DIAG: M54.50,G89.29 (ICD-10-CM) - Chronic bilateral low back pain without sciatica  Rationale for Evaluation and Treatment: Rehabilitation  THERAPY DIAG:  Chronic  bilateral low back pain without sciatica  Muscle weakness (generalized)  Decreased activity tolerance  ONSET DATE: chronic pain; >3 months of experiencing pain  SUBJECTIVE:                                                                                                                                                                                           SUBJECTIVE STATEMENT: Reports back feeling better and feeling stronger. Believes that he is trending in the right direction. Has been consistent with HEP  Not in so much pain, lower back feels weak. Can't walk far bc of  weakness from aching. Comfortable walking limit appx 40 ft. 10 minutes max of standing.   PERTINENT HISTORY:  Pt has extremely low activity tolerance when performing basic ADL's  PAIN:  0/10C, 4/10W Are you having pain? Yes: NPRS scale: 10 Pain location: BL lumbar spine Pain description: achy, dull Aggravating factors: too much  Relieving factors: rest, sitting  PRECAUTIONS: None  RED FLAGS: None   WEIGHT BEARING RESTRICTIONS: No  FALLS:  Has patient fallen in last 6 months? Yes. Number of falls 1    PLOF: Independent with basic ADLs  PATIENT GOALS: being able to walk, being able to do ADL (50%)   OBJECTIVE:  Note: Objective measures were completed at Evaluation unless otherwise noted.    PATIENT SURVEYS:  Modified ODI  Score: 13  COGNITION: Overall cognitive status: Within functional limits for tasks assessed     SENSATION: WFL    POSTURE: rounded shoulders, forward head, and increased lumbar lordosis  PALPATION: TTP BL lumbar extensors  LUMBAR ROM:   AROM eval  Flexion WFL some p!  Extension 50% lim p!  Right lateral flexion 25% lim  Left lateral flexion 25% lim  Right rotation 25% lim  Left rotation 25% lim   (Blank rows = not tested)  LOWER EXTREMITY ROM:     Active  Right eval Left eval  Hip flexion [facility]  Hip extension    Hip abduction [facility]  Hip adduction    Hip internal rotation    Hip external rotation    Knee flexion [facility] WFL  Knee extension [facility]  Ankle dorsiflexion    Ankle plantarflexion    Ankle inversion    Ankle eversion     (Blank rows = not tested)  LOWER EXTREMITY MMT:    MMT Right eval Left eval  Hip flexion 4- 4-  Hip extension    Hip abduction 3+ 3+  Hip adduction (seated modified) 4 4  Hip internal rotation    Hip external rotation    Knee flexion 4 4  Knee extension 4+ 4+  Ankle dorsiflexion    Ankle plantarflexion    Ankle inversion    Ankle eversion     (Blank rows = not  tested)  LUMBAR SPECIAL TESTS:  none     TREATMENT DATE:    TREATMENT 05/05/2024:  Therapeutic Exercise: Patient educated on HEP, POC, prognosis, and relevant tissues/anatomy.  HEP update and reassessment Reassessing pt frequently regarding sx (low activity tolerance) Seated PPT 2x8x3s Seated alternating hip march 2x8x3s  Therapeutic Activity: STS 3x5 KF~100 degrees seated hinge/DL regression [id number]   TREATMENT 04/28/2024:  Therapeutic Exercise: supine LTR x8 Supine PPT x8x3s Patient educated on HEP, POC, prognosis, and relevant tissues/anatomy.                                                                                                                                      PATIENT EDUCATION:  Education details: HEP Person educated: Patient Education method: Programmer, multimedia, Facilities manager, Actor cues, Verbal cues, and Handouts Education comprehension: verbalized understanding and returned demonstration  HOME EXERCISE PROGRAM: Access Code: [email or web address] URL: [email or web address] Date: 04/28/2024 Prepared by: Washington Scot  Exercises - Supine Lower Trunk Rotation  - 2 x daily - 5 x weekly - 2 sets - 8 reps - Supine Posterior Pelvic Tilt  - 2 x daily - 5 x weekly - 2 sets - 8 reps - 3 hold  5x/wk 2-3x/day, 2 sets  STS w/ arms crossed x3 Seated hip hinge x3x3s  ASSESSMENT:  CLINICAL IMPRESSION:  Patient tolerated treatment with no increases in pain with progressions in BL hip, LE, and core loading. Current deficits include: activity tolerance, core strength, and BL LE and hip strength. As a result, patient would continue to benefit from skilled PT to address said deficits via plan below.     EVAL: Patient is a 83 year old male who presents with chronic BL LBP without sciatica. Patient presents with deficits in: functional activity tolerance, standing>10 minutes, walking>40 ft, balance, and general lower body and core weakness. As a result, the  patient would benefit from skilled PT to address aforementioned deficits via plan below.   OBJECTIVE IMPAIRMENTS: decreased activity tolerance, decreased balance, decreased mobility, difficulty walking, decreased strength, and pain.   PERSONAL FACTORS: Age, Fitness, and 3+ comorbidities:   are also affecting patient's functional outcome.   REHAB POTENTIAL: Fair    CLINICAL DECISION MAKING: Evolving/moderate complexity  EVALUATION COMPLEXITY: Moderate   GOALS: Goals reviewed with patient? No  SHORT TERM GOALS: Target date: 05/19/2024    Patient will be able to do ADL at least 60% capacity to demonstrate improvements in lower body activity tolerance needed for ADL's  Baseline: 50% Goal status: INITIAL  2.  Patient will be able to stand at least 15 minutes without significant increases in pain to demonstrate improvements in lower body activity tolerance needed for ADL's  Baseline: 10 min Goal status: INITIAL   LONG TERM GOALS: Target  date: 06/28/24  Patient will demonstrate 100% HEP compliance to show independence with self-management of condition  Baseline: 0% Goal status: INITIAL  2.  Patient will demonstrate a 5 point improvement in ODI to show improvements in ADL completion and overall QOL   Baseline: 13 Goal status: INITIAL  3.  Patient will decrease worst pain to 2/10 at most to improve ADL completion and overall QOL  Baseline: 5/10 Goal status: INITIAL  4.  Patient will be able to walk at least 120 ft to demonstrate improvements in lower body tolerance needed for community ambulation  Baseline: 40 ft Goal status: INITIAL   PLAN:  PT FREQUENCY: 1-2x/week  PT DURATION: 8 weeks  PLANNED INTERVENTIONS: 97110-Therapeutic exercises, 97530- Therapeutic activity, [id number]- Neuromuscular re-education, 97535- Self Care, 02859- Manual therapy, and Patient/Family education.  PLAN FOR NEXT SESSION: HEP assessment and progression, symptom modulation, and loading  (isolated and/or functional). Manual therapy, aerobic, gait, and NME training as needed.     Washington Odessia Scot  PT, DPT

## 2024-05-07 ENCOUNTER — Ambulatory Visit

## 2024-05-07 DIAGNOSIS — M6281 Muscle weakness (generalized): Secondary | ICD-10-CM

## 2024-05-07 DIAGNOSIS — M545 Low back pain, unspecified: Secondary | ICD-10-CM | POA: Diagnosis not present

## 2024-05-07 DIAGNOSIS — R6889 Other general symptoms and signs: Secondary | ICD-10-CM

## 2024-05-07 NOTE — Therapy (Signed)
 OUTPATIENT PHYSICAL THERAPY THORACOLUMBAR EVALUATION   Patient Name: [name] MRN: [id number] DOB:May 28, 1941, 83 y.o., male Today's Date: 05/07/2024  END OF SESSION:  PT End of Session - 05/07/24 1319     Visit Number 3    Number of Visits 16    Date for Recertification  06/28/24    Authorization Type [employer]    PT Start Time 1315    PT Stop Time 1400    PT Time Calculation (min) 45 min    Activity Tolerance Patient tolerated treatment well            Past Medical History:  Diagnosis Date   A-fib (HCC)    Acute appendicitis with peritoneal abscess 01/28/2013   Allergy    seasonal    Anxiety    Arthritis    Bacteremia September 2015   Elevated PSA    Essential hypertension 04/16/2014   Heart murmur    childhood   Ileus, postoperative (HCC) 01/28/2013   Lung cancer (HCC) 2016   Obesity, morbid (HCC) 01/28/2013   Shortness of breath    Tobacco use disorder 01/28/2013   Past Surgical History:  Procedure Laterality Date   APPENDECTOMY     BACK SURGERY  1997, 2003   Dr.Apleton    CARPAL TUNNEL RELEASE     COLONOSCOPY     x 3   EYE SURGERY Left    muscle of the eye    KNEE SURGERY Bilateral    LAPAROSCOPIC APPENDECTOMY N/A 01/25/2013   Procedure: APPENDECTOMY LAPAROSCOPIC;  Surgeon: Lynda Leos, MD;  Location: [facility] OR;  Service: General;  Laterality: N/A;   LYMPH NODE DISSECTION Left 04/26/2015   Procedure: LYMPH NODE DISSECTION;  Surgeon: Dallas KATHEE Jude, MD;  Location: [facility] OR;  Service: Thoracic;  Laterality: Left;   POLYPECTOMY     SHOULDER SURGERY Right 2014   Dr.Whitfield rotator cuff surgery   SKIN SURGERY Right 01/2019   Right Leg, cancerous area removed   SKIN SURGERY  01/01/2022   Right hand (wrist and between thumb and forefinger). [facility]   TEE WITHOUT CARDIOVERSION N/A 04/07/2014   Procedure: TRANSESOPHAGEAL ECHOCARDIOGRAM (TEE);  Surgeon: Oneil Parchment, MD;  Location: [facility] ENDOSCOPY;  Service: Cardiovascular;   Laterality: N/A;   VIDEO ASSISTED THORACOSCOPY (VATS)/WEDGE RESECTION Left 04/26/2015   Procedure: LEFT VIDEO ASSISTED THORACOSCOPY (VATS) WITH LEFT LOWER LOBECTOMY;  Surgeon: Dallas KATHEE Jude, MD;  Location: [facility] OR;  Service: Thoracic;  Laterality: Left;   VIDEO BRONCHOSCOPY N/A 04/26/2015   Procedure: VIDEO BRONCHOSCOPY;  Surgeon: Dallas KATHEE Jude, MD;  Location: [facility] OR;  Service: Thoracic;  Laterality: N/A;   Patient Active Problem List   Diagnosis Date Noted   Venous insufficiency of both lower extremities 04/10/2024   Hyperglycemia 04/10/2024   Mixed hyperlipidemia 04/10/2024   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/26/2015   Multiple lung nodules on CT 03/02/2015   DOE (dyspnea on exertion) 04/16/2014   Physical deconditioning 04/16/2014   Essential hypertension 04/16/2014   Leg swelling 04/16/2014   Back pain 04/01/2014   Transaminitis 04/01/2014   Thrombocytopenia, unspecified 04/01/2014   Tobacco use disorder 01/28/2013   Obesity, morbid (HCC) 01/28/2013   IMPACTED CERUMEN 01/29/2008   GENITAL HERPES, 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    PCP: Caro Harlene POUR, NP Ref Provider (PCP)   REFERRING PROVIDER: Caro Harlene POUR, NP Ref Provider (PCP)   REFERRING DIAG: M54.50,G89.29 (ICD-10-CM) - Chronic bilateral low back pain without sciatica  Rationale for Evaluation and Treatment: Rehabilitation  THERAPY DIAG:  Chronic bilateral low back pain without sciatica  Muscle weakness (generalized)  Decreased activity tolerance  ONSET DATE: chronic pain; >3 months of experiencing pain  SUBJECTIVE:                                                                                                                                                                                           SUBJECTIVE STATEMENT: Reports feeling a little sore from last session. Had to run to grocery store before apt and is a little tired still.  Reports back feeling better and feeling stronger. Believes that he is trending in  the right direction. Has been consistent with HEP  Not in so much pain, lower back feels weak. Can't walk far bc of weakness from aching. Comfortable walking limit appx 40 ft. 10 minutes max of standing.   PERTINENT HISTORY:  Pt has extremely low activity tolerance when performing basic ADL's  PAIN:  0/10C, 4/10W Are you having pain? Yes: NPRS scale: 10 Pain location: BL lumbar spine Pain description: achy, dull Aggravating factors: too much  Relieving factors: rest, sitting  PRECAUTIONS: None  RED FLAGS: None   WEIGHT BEARING RESTRICTIONS: No  FALLS:  Has patient fallen in last 6 months? Yes. Number of falls 1    PLOF: Independent with basic ADLs  PATIENT GOALS: being able to walk, being able to do ADL (50%)   OBJECTIVE:  Note: Objective measures were completed at Evaluation unless otherwise noted.    PATIENT SURVEYS:  Modified ODI  Score: 13  COGNITION: Overall cognitive status: Within functional limits for tasks assessed     SENSATION: WFL    POSTURE: rounded shoulders, forward head, and increased lumbar lordosis  PALPATION: TTP BL lumbar extensors  LUMBAR ROM:   AROM eval  Flexion WFL some p!  Extension 50% lim p!  Right lateral flexion 25% lim  Left lateral flexion 25% lim  Right rotation 25% lim  Left rotation 25% lim   (Blank rows = not tested)  LOWER EXTREMITY ROM:     Active  Right eval Left eval  Hip flexion [facility]  Hip extension    Hip abduction [facility]  Hip adduction    Hip internal rotation    Hip external rotation    Knee flexion [facility]  Knee extension [facility]  Ankle dorsiflexion    Ankle plantarflexion    Ankle inversion    Ankle eversion     (Blank rows = not tested)  LOWER EXTREMITY MMT:    MMT Right eval Left eval  Hip flexion 4- 4-  Hip extension    Hip abduction 3+ 3+  Hip adduction (seated modified) 4 4  Hip internal rotation    Hip external rotation    Knee flexion 4 4  Knee extension 4+ 4+   Ankle dorsiflexion    Ankle plantarflexion    Ankle inversion    Ankle eversion     (Blank rows = not tested)  LUMBAR SPECIAL TESTS:  none     TREATMENT DATE:    TREATMENT 05/07/2024:  Therapeutic Exercise: HEP update and reassessment Seated hip march 2x10x3s    Therapeutic Activity: Reassessing pt frequently regarding sx (low activity tolerance) STS lowest table setting x4, x6  10# sumo DL 8 inches 2x8 Walking 60 ft x2 4 inch step up x4 BL (CG)   TREATMENT 05/05/2024:  Therapeutic Exercise: Patient educated on HEP, POC, prognosis, and relevant tissues/anatomy.   Seated PPT 2x8x3s Seated alternating hip march 2x8x3s  Therapeutic Activity: STS 3x5 KF~100 degrees seated hinge/DL regression [id number]   TREATMENT 04/28/2024:  Therapeutic Exercise: supine LTR x8 Supine PPT x8x3s Patient educated on HEP, POC, prognosis, and relevant tissues/anatomy.                                                                                                                                      PATIENT EDUCATION:  Education details: HEP Person educated: Patient Education method: Programmer, multimedia, Facilities manager, Actor cues, Verbal cues, and Handouts Education comprehension: verbalized understanding and returned demonstration  HOME EXERCISE PROGRAM: Access Code: [email or web address] URL: [email or web address] Date: 04/28/2024 Prepared by: Washington Scot  Exercises - Supine Lower Trunk Rotation  - 2 x daily - 5 x weekly - 2 sets - 8 reps - Supine Posterior Pelvic Tilt  - 2 x daily - 5 x weekly - 2 sets - 8 reps - 3 hold  5x/wk 2-3x/day, 2 sets  STS w/ arms crossed x3 Seated hip hinge x3x3s  ASSESSMENT:  CLINICAL IMPRESSION:  Patient tolerated treatment with no increases in pain with progressions in BL hip, LE, core loading, and CVP endurance. Current deficits include: activity tolerance, core strength, and BL LE and hip strength. As a result, patient would continue  to benefit from skilled PT to address said deficits via plan below.     EVAL: Patient is a 83 year old male who presents with chronic BL LBP without sciatica. Patient presents with deficits in: functional activity tolerance, standing>10 minutes, walking>40 ft, balance, and general lower body and core weakness. As a result, the patient would benefit from skilled PT to address aforementioned deficits via plan below.   OBJECTIVE IMPAIRMENTS: decreased activity tolerance, decreased balance, decreased mobility, difficulty walking, decreased strength, and pain.   PERSONAL FACTORS: Age, Fitness, and 3+ comorbidities:   are also affecting patient's functional outcome.   REHAB POTENTIAL: Fair    CLINICAL DECISION MAKING: Evolving/moderate complexity  EVALUATION COMPLEXITY: Moderate   GOALS: Goals reviewed with patient? No  SHORT TERM GOALS: Target date: 05/19/2024    Patient will be  able to do ADL at least 60% capacity to demonstrate improvements in lower body activity tolerance needed for ADL's  Baseline: 50% Goal status: INITIAL  2.  Patient will be able to stand at least 15 minutes without significant increases in pain to demonstrate improvements in lower body activity tolerance needed for ADL's  Baseline: 10 min Goal status: INITIAL   LONG TERM GOALS: Target date: 06/28/24  Patient will demonstrate 100% HEP compliance to show independence with self-management of condition  Baseline: 0% Goal status: INITIAL  2.  Patient will demonstrate a 5 point improvement in ODI to show improvements in ADL completion and overall QOL   Baseline: 13 Goal status: INITIAL  3.  Patient will decrease worst pain to 2/10 at most to improve ADL completion and overall QOL  Baseline: 5/10 Goal status: INITIAL  4.  Patient will be able to walk at least 120 ft to demonstrate improvements in lower body tolerance needed for community ambulation  Baseline: 40 ft Goal status:  INITIAL   PLAN:  PT FREQUENCY: 1-2x/week  PT DURATION: 8 weeks  PLANNED INTERVENTIONS: 97110-Therapeutic exercises, 97530- Therapeutic activity, [id number]- Neuromuscular re-education, 97535- Self Care, [id number]- Manual therapy, and Patient/Family education.  PLAN FOR NEXT SESSION: HEP assessment and progression, symptom modulation, and loading (isolated and/or functional). Manual therapy, aerobic, gait, and NME training as needed.     Washington Odessia Scot  PT, DPT

## 2024-05-12 ENCOUNTER — Ambulatory Visit: Admitting: Physical Therapy

## 2024-05-12 ENCOUNTER — Encounter: Payer: Self-pay | Admitting: Physical Therapy

## 2024-05-12 DIAGNOSIS — G8929 Other chronic pain: Secondary | ICD-10-CM

## 2024-05-12 DIAGNOSIS — R6889 Other general symptoms and signs: Secondary | ICD-10-CM

## 2024-05-12 DIAGNOSIS — M6281 Muscle weakness (generalized): Secondary | ICD-10-CM

## 2024-05-12 DIAGNOSIS — M545 Low back pain, unspecified: Secondary | ICD-10-CM | POA: Diagnosis not present

## 2024-05-12 NOTE — Therapy (Signed)
 OUTPATIENT PHYSICAL THERAPY THORACOLUMBAR TREATMENT   Patient Name: [name] MRN: [id number] DOB:14-Jan-1941, 83 y.o., male Today's Date: 05/12/2024  END OF SESSION:  PT End of Session - 05/12/24 1144     Visit Number 4    Number of Visits 16    Date for Recertification  06/28/24    Authorization Type [employer]    PT Start Time 1145    PT Stop Time 1230    PT Time Calculation (min) 45 min            Past Medical History:  Diagnosis Date   A-fib [facility])    Acute appendicitis with peritoneal abscess 01/28/2013   Allergy    seasonal    Anxiety    Arthritis    Bacteremia September 2015   Elevated PSA    Essential hypertension 04/16/2014   Heart murmur    childhood   Ileus, postoperative (HCC) 01/28/2013   Lung cancer (HCC) 2016   Obesity, morbid (HCC) 01/28/2013   Shortness of breath    Tobacco use disorder 01/28/2013   Past Surgical History:  Procedure Laterality Date   APPENDECTOMY     BACK SURGERY  1997, 2003   Dr.Apleton    CARPAL TUNNEL RELEASE     COLONOSCOPY     x 3   EYE SURGERY Left    muscle of the eye    KNEE SURGERY Bilateral    LAPAROSCOPIC APPENDECTOMY N/A 01/25/2013   Procedure: APPENDECTOMY LAPAROSCOPIC;  Surgeon: Lynda Leos, MD;  Location: [facility] OR;  Service: General;  Laterality: N/A;   LYMPH NODE DISSECTION Left 04/26/2015   Procedure: LYMPH NODE DISSECTION;  Surgeon: Dallas KATHEE Jude, MD;  Location: [facility] OR;  Service: Thoracic;  Laterality: Left;   POLYPECTOMY     SHOULDER SURGERY Right 2014   Dr.Whitfield rotator cuff surgery   SKIN SURGERY Right 01/2019   Right Leg, cancerous area removed   SKIN SURGERY  01/01/2022   Right hand (wrist and between thumb and forefinger). [facility]   TEE WITHOUT CARDIOVERSION N/A 04/07/2014   Procedure: TRANSESOPHAGEAL ECHOCARDIOGRAM (TEE);  Surgeon: Oneil Parchment, MD;  Location: [facility] ENDOSCOPY;  Service: Cardiovascular;  Laterality: N/A;   VIDEO ASSISTED THORACOSCOPY (VATS)/WEDGE  RESECTION Left 04/26/2015   Procedure: LEFT VIDEO ASSISTED THORACOSCOPY (VATS) WITH LEFT LOWER LOBECTOMY;  Surgeon: Dallas KATHEE Jude, MD;  Location: [facility] OR;  Service: Thoracic;  Laterality: Left;   VIDEO BRONCHOSCOPY N/A 04/26/2015   Procedure: VIDEO BRONCHOSCOPY;  Surgeon: Dallas KATHEE Jude, MD;  [facility] OR;  Service: Thoracic;  Laterality: N/A;   Patient Active Problem List   Diagnosis Date Noted   Venous insufficiency of both lower extremities 04/10/2024   Hyperglycemia 04/10/2024   Mixed hyperlipidemia 04/10/2024   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/26/2015   Multiple lung nodules on CT 03/02/2015   DOE (dyspnea on exertion) 04/16/2014   Physical deconditioning 04/16/2014   Essential hypertension 04/16/2014   Leg swelling 04/16/2014   Back pain 04/01/2014   Transaminitis 04/01/2014   Thrombocytopenia, unspecified 04/01/2014   Tobacco use disorder 01/28/2013   Obesity, morbid (HCC) 01/28/2013   IMPACTED CERUMEN 01/29/2008   GENITAL HERPES, 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    PCP: Caro Harlene POUR, NP Ref Provider (PCP)   REFERRING PROVIDER: Caro Harlene POUR, NP Ref Provider (PCP)   REFERRING DIAG: M54.50,G89.29 (ICD-10-CM) - Chronic bilateral low back pain without sciatica  Rationale for Evaluation and Treatment: Rehabilitation  THERAPY DIAG:  Chronic bilateral low back pain without sciatica  Muscle  weakness (generalized)  Decreased activity tolerance  ONSET DATE: chronic pain; >3 months of experiencing pain  SUBJECTIVE:                                                                                                                                                                                           SUBJECTIVE STATEMENT: Reports he does not really have back pain it is back and leg weakness. Can barely make it in from the parking lot.   Reports back feeling better and feeling stronger. Believes that he is trending in the right direction. Has been consistent with HEP  Not in so  much pain, lower back feels weak. Can't walk far bc of weakness from aching. Comfortable walking limit appx 40 ft. 10 minutes max of standing.   PERTINENT HISTORY:  Pt has extremely low activity tolerance when performing basic ADL's  PAIN:  0/10C, 4/10W Are you having pain? Yes: NPRS scale: 0 Pain location: BL lumbar spine Pain description: achy, dull Aggravating factors: too much  Relieving factors: rest, sitting  PRECAUTIONS: None  RED FLAGS: None   WEIGHT BEARING RESTRICTIONS: No  FALLS:  Has patient fallen in last 6 months? Yes. Number of falls 1    PLOF: Independent with basic ADLs  PATIENT GOALS: being able to walk, being able to do ADL (50%)   OBJECTIVE:  Note: Objective measures were completed at Evaluation unless otherwise noted.    PATIENT SURVEYS:  Modified ODI  Score: 13  COGNITION: Overall cognitive status: Within functional limits for tasks assessed     SENSATION: WFL    POSTURE: rounded shoulders, forward head, and increased lumbar lordosis  PALPATION: TTP BL lumbar extensors  LUMBAR ROM:   AROM eval  Flexion WFL some p!  Extension 50% lim p!  Right lateral flexion 25% lim  Left lateral flexion 25% lim  Right rotation 25% lim  Left rotation 25% lim   (Blank rows = not tested)  LOWER EXTREMITY ROM:     Active  Right eval Left eval  Hip flexion [facility]  Hip extension    Hip abduction [facility]  Hip adduction    Hip internal rotation    Hip external rotation    Knee flexion [facility] WFL  Knee extension [facility]  Ankle dorsiflexion    Ankle plantarflexion    Ankle inversion    Ankle eversion     (Blank rows = not tested)  LOWER EXTREMITY MMT:    MMT Right eval Left eval  Hip flexion 4- 4-  Hip extension    Hip abduction 3+ 3+  Hip adduction (seated modified) 4 4  Hip internal rotation    Hip external rotation    Knee flexion 4 4  Knee extension 4+ 4+  Ankle dorsiflexion    Ankle plantarflexion    Ankle inversion     Ankle eversion     (Blank rows = not tested)  LUMBAR SPECIAL TESTS:  none     TREATMENT DATE:   [facility] Adult PT Treatment:                                                DATE: 05/12/24  Supine marching x 10 each alternating Bridge 3 sec x 10  Supine Clam BTB 10 x 2  Supine march BTB  PPT - verbal and tactile cues Ab press with ball in hooklying 3 [place] x 10  LTR Nustep L2 x 5 minutes UE/LE- c/o muscles burning in upper legs SPO2 92%  Updated HEP     TREATMENT 05/07/2024:  Therapeutic Exercise: HEP update and reassessment Seated hip march 2x10x3s    Therapeutic Activity: Reassessing pt frequently regarding sx (low activity tolerance) STS lowest table setting x4, x6  10# sumo DL 8 inches 2x8 Walking 60 ft x2 4 inch step up x4 BL (CG)   TREATMENT 05/05/2024:  Therapeutic Exercise: Patient educated on HEP, POC, prognosis, and relevant tissues/anatomy.   Seated PPT 2x8x3s Seated alternating hip march 2x8x3s  Therapeutic Activity: STS 3x5 KF~100 degrees seated hinge/DL regression [id number]   TREATMENT 04/28/2024:  Therapeutic Exercise: supine LTR x8 Supine PPT x8x3s Patient educated on HEP, POC, prognosis, and relevant tissues/anatomy.                                                                                                                                      PATIENT EDUCATION:  Education details: HEP Person educated: Patient Education method: Programmer, Multimedia, Facilities Manager, Actor cues, Verbal cues, and Handouts Education comprehension: verbalized understanding and returned demonstration  HOME EXERCISE PROGRAM: Access Code: [email or web address] URL: [email or web address] Date: 05/12/2024 Prepared by: Harlene Persons  Exercises - Supine pelvic tilit  - 2 x daily - 7 x weekly - 2 sets - 8 reps - Supine Lower Trunk Rotation  - 2 x daily - 7 x weekly - 2 sets - 8 reps - Sit to Stand with Arms Crossed  - 2-3 x daily - 7 x weekly - 2 sets - 3  reps - Seated Hip Flexion Toward Target  - 2-3 x daily - 5 x weekly - 3 sets - 3 reps - Supine March with Resistance Band  - 2 x daily - 7 x weekly - 1 sets - 10 reps - Hooklying Clamshell with Resistance  - 2 x daily - 7 x weekly - 1 sets - 10 reps  ASSESSMENT:  CLINICAL IMPRESSION: Patient tolerated treatment with no increases in  pain with progressions in BL hip, LE, core loading, and CVP endurance. Reviewed PPT which required some verbal cues to complete correctly. Added supine resisted hip flexion and abduction with good tolerance. His HEP was updated. Began Nustep and he completed 5 minutes with c/o some muscle burning in his thighs. Overall, session tolerated well.  Current deficits include: activity tolerance, core strength, and BL LE and hip strength. As a result, patient would continue to benefit from skilled PT to address said deficits via plan below.     EVAL: Patient is a 83 year old male who presents with chronic BL LBP without sciatica. Patient presents with deficits in: functional activity tolerance, standing>10 minutes, walking>40 ft, balance, and general lower body and core weakness. As a result, the patient would benefit from skilled PT to address aforementioned deficits via plan below.   OBJECTIVE IMPAIRMENTS: decreased activity tolerance, decreased balance, decreased mobility, difficulty walking, decreased strength, and pain.   PERSONAL FACTORS: Age, Fitness, and 3+ comorbidities:   are also affecting patient's functional outcome.   REHAB POTENTIAL: Fair    CLINICAL DECISION MAKING: Evolving/moderate complexity  EVALUATION COMPLEXITY: Moderate   GOALS: Goals reviewed with patient? No  SHORT TERM GOALS: Target date: 05/19/2024    Patient will be able to do ADL at least 60% capacity to demonstrate improvements in lower body activity tolerance needed for ADL's  Baseline: 50% Goal status: INITIAL  2.  Patient will be able to stand at least 15 minutes without  significant increases in pain to demonstrate improvements in lower body activity tolerance needed for ADL's  Baseline: 10 min Goal status: INITIAL   LONG TERM GOALS: Target date: 06/28/24  Patient will demonstrate 100% HEP compliance to show independence with self-management of condition  Baseline: 0% Goal status: INITIAL  2.  Patient will demonstrate a 5 point improvement in ODI to show improvements in ADL completion and overall QOL   Baseline: 13 Goal status: INITIAL  3.  Patient will decrease worst pain to 2/10 at most to improve ADL completion and overall QOL  Baseline: 5/10 Goal status: INITIAL  4.  Patient will be able to walk at least 120 ft to demonstrate improvements in lower body tolerance needed for community ambulation  Baseline: 40 ft Goal status: INITIAL   PLAN:  PT FREQUENCY: 1-2x/week  PT DURATION: 8 weeks  PLANNED INTERVENTIONS: 97110-Therapeutic exercises, 97530- Therapeutic activity, [id number]- Neuromuscular re-education, 97535- Self Care, [id number]- Manual therapy, and Patient/Family education.  PLAN FOR NEXT SESSION: HEP assessment and progression, symptom modulation, and loading (isolated and/or functional). Manual therapy, aerobic, gait, and NME training as needed.     Harlene Persons, PTA 05/12/24 1:06 PM Phone: [phone] Fax: [phone]

## 2024-05-14 ENCOUNTER — Other Ambulatory Visit: Payer: Self-pay | Admitting: Nurse Practitioner

## 2024-05-14 ENCOUNTER — Ambulatory Visit

## 2024-05-14 DIAGNOSIS — R6889 Other general symptoms and signs: Secondary | ICD-10-CM

## 2024-05-14 DIAGNOSIS — M545 Low back pain, unspecified: Secondary | ICD-10-CM | POA: Diagnosis not present

## 2024-05-14 DIAGNOSIS — M6281 Muscle weakness (generalized): Secondary | ICD-10-CM

## 2024-05-14 DIAGNOSIS — E782 Mixed hyperlipidemia: Secondary | ICD-10-CM

## 2024-05-14 NOTE — Therapy (Addendum)
 " OUTPATIENT PHYSICAL THERAPY THORACOLUMBAR TREATMENT PHYSICAL THERAPY UNPLANNED DISCHARGE SUMMARY   Visits from Start of Care: 5  Current functional level related to goals / functional outcomes: Current status unknown   Remaining deficits: Current status unknown   Education / Equipment: Pt has not returned since visit listed below  Patient goals were not assessed. Patient is being discharged due to not returning since the last visit.  (the note below was addended to include the above D/C summary on 08/12/24)   Patient Name: [name] MRN: [id number] DOB:August 10, 1940, 83 y.o., male Today's Date: 05/14/2024  END OF SESSION:  PT End of Session - 05/14/24 1137     Visit Number 5    Number of Visits 16    Date for Recertification  06/28/24    Authorization Type [employer]    PT Start Time 1145    PT Stop Time 1230    PT Time Calculation (min) 45 min    Activity Tolerance Patient tolerated treatment well             Past Medical History:  Diagnosis Date   A-fib (HCC)    Acute appendicitis with peritoneal abscess 01/28/2013   Allergy    seasonal    Anxiety    Arthritis    Bacteremia September 2015   Elevated PSA    Essential hypertension 04/16/2014   Heart murmur    childhood   Ileus, postoperative (HCC) 01/28/2013   Lung cancer (HCC) 2016   Obesity, morbid (HCC) 01/28/2013   Shortness of breath    Tobacco use disorder 01/28/2013   Past Surgical History:  Procedure Laterality Date   APPENDECTOMY     BACK SURGERY  1997, 2003   Dr.Apleton    CARPAL TUNNEL RELEASE     COLONOSCOPY     x 3   EYE SURGERY Left    muscle of the eye    KNEE SURGERY Bilateral    LAPAROSCOPIC APPENDECTOMY N/A 01/25/2013   Procedure: APPENDECTOMY LAPAROSCOPIC;  Surgeon: Lynda Leos, MD;  Location: [facility] OR;  Service: General;  Laterality: N/A;   LYMPH NODE DISSECTION Left 04/26/2015   Procedure: LYMPH NODE DISSECTION;  Surgeon: Dallas KATHEE Jude, MD;  Location: [facility]  OR;  Service: Thoracic;  Laterality: Left;   POLYPECTOMY     SHOULDER SURGERY Right 2014   Dr.Whitfield rotator cuff surgery   SKIN SURGERY Right 01/2019   Right Leg, cancerous area removed   SKIN SURGERY  01/01/2022   Right hand (wrist and between thumb and forefinger). [facility]   TEE WITHOUT CARDIOVERSION N/A 04/07/2014   Procedure: TRANSESOPHAGEAL ECHOCARDIOGRAM (TEE);  Surgeon: Oneil Parchment, MD;  Location: [facility] ENDOSCOPY;  Service: Cardiovascular;  Laterality: N/A;   VIDEO ASSISTED THORACOSCOPY (VATS)/WEDGE RESECTION Left 04/26/2015   Procedure: LEFT VIDEO ASSISTED THORACOSCOPY (VATS) WITH LEFT LOWER LOBECTOMY;  Surgeon: Dallas KATHEE Jude, MD;  Location: [facility] OR;  Service: Thoracic;  Laterality: Left;   VIDEO BRONCHOSCOPY N/A 04/26/2015   Procedure: VIDEO BRONCHOSCOPY;  Surgeon: Dallas KATHEE Jude, MD;  Location[facility] OR;  Service: Thoracic;  Laterality: N/A;   Patient Active Problem List   Diagnosis Date Noted   Venous insufficiency of both lower extremities 04/10/2024   Hyperglycemia 04/10/2024   Mixed hyperlipidemia 04/10/2024   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/26/2015   Multiple lung nodules on CT 03/02/2015   DOE (dyspnea on exertion) 04/16/2014   Physical deconditioning 04/16/2014   Essential hypertension 04/16/2014   Leg swelling 04/16/2014   Back pain 04/01/2014  Transaminitis 04/01/2014   Thrombocytopenia, unspecified 04/01/2014   Tobacco use disorder 01/28/2013   Obesity, morbid (HCC) 01/28/2013   IMPACTED CERUMEN 01/29/2008   GENITAL HERPES, 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    PCP: Caro Harlene POUR, NP Ref Provider (PCP)   REFERRING PROVIDER: Caro Harlene POUR, NP Ref Provider (PCP)   REFERRING DIAG: M54.50,G89.29 (ICD-10-CM) - Chronic bilateral low back pain without sciatica  Rationale for Evaluation and Treatment: Rehabilitation  THERAPY DIAG:  Muscle weakness (generalized)  Decreased activity tolerance  ONSET DATE: chronic pain; >3 months of experiencing  pain  SUBJECTIVE:                                                                                                                                                                                           SUBJECTIVE STATEMENT: Doing pretty good today; didn't do a grocery run right before the session so fatigue is less. Been doing Hep consistently.  EVAL: Not in so much pain, lower back feels weak. Can't walk far bc of weakness from aching. Comfortable walking limit appx 40 ft. 10 minutes max of standing.   PERTINENT HISTORY:  Pt has extremely low activity tolerance when performing basic ADL's  PAIN:  0/10C, 4/10W Are you having pain? Yes: NPRS scale: 0 Pain location: BL lumbar spine Pain description: achy, dull Aggravating factors: too much  Relieving factors: rest, sitting  PRECAUTIONS: None  RED FLAGS: None   WEIGHT BEARING RESTRICTIONS: No  FALLS:  Has patient fallen in last 6 months? Yes. Number of falls 1    PLOF: Independent with basic ADLs  PATIENT GOALS: being able to walk, being able to do ADL (50%)   OBJECTIVE:  Note: Objective measures were completed at Evaluation unless otherwise noted.    PATIENT SURVEYS:  Modified ODI  Score: 13  COGNITION: Overall cognitive status: Within functional limits for tasks assessed     SENSATION: WFL    POSTURE: rounded shoulders, forward head, and increased lumbar lordosis  PALPATION: TTP BL lumbar extensors  LUMBAR ROM:   AROM eval  Flexion WFL some p!  Extension 50% lim p!  Right lateral flexion 25% lim  Left lateral flexion 25% lim  Right rotation 25% lim  Left rotation 25% lim   (Blank rows = not tested)  LOWER EXTREMITY ROM:     Active  Right eval Left eval  Hip flexion [facility]  Hip extension    Hip abduction [facility]  Hip adduction    Hip internal rotation    Hip external rotation    Knee flexion [facility]  Knee extension [facility]  Ankle dorsiflexion  Ankle plantarflexion    Ankle  inversion    Ankle eversion     (Blank rows = not tested)  LOWER EXTREMITY MMT:    MMT Right eval Left eval  Hip flexion 4- 4-  Hip extension    Hip abduction 3+ 3+  Hip adduction (seated modified) 4 4  Hip internal rotation    Hip external rotation    Knee flexion 4 4  Knee extension 4+ 4+  Ankle dorsiflexion    Ankle plantarflexion    Ankle inversion    Ankle eversion     (Blank rows = not tested)  LUMBAR SPECIAL TESTS:  none     TREATMENT DATE:  *Patient required extra time for exercises due to increased monitoring, reassessment, and rest due to low activity tolerance and/or high irritability of symptoms* TREATMENT 05/14/2024: Therapeutic Exercise: 93o2, 79 hr (baseline) HEP update and reassessment Seated hip march 2x12x3s Seated GTB UL HS curl 2x8x3s BL Seated hip hinge x8x3s    Therapeutic Activity: *Reassessing pt frequently regarding sx (low activity tolerance)* 10# sumo DL from floor 2x4 (cuing on hip hinge) Walking lap around gym with CG (80 ft) 4 inch step up x6 BL (CG) STS x8 (lowest table setting)       [facility] Adult PT Treatment:                                                DATE: 05/12/24  Supine marching x 10 each alternating Bridge 3 sec x 10  Supine Clam BTB 10 x 2  Supine march BTB  PPT - verbal and tactile cues Ab press with ball in hooklying 3 [place] x 10  LTR Nustep L2 x 5 minutes UE/LE- c/o muscles burning in upper legs SPO2 92%  Updated HEP     TREATMENT 05/07/2024:  Therapeutic Exercise: HEP update and reassessment Seated hip march 2x10x3s    Therapeutic Activity: Reassessing pt frequently regarding sx (low activity tolerance) STS lowest table setting x4, x6  10# sumo DL 8 inches 2x8 Walking 60 ft x2 4 inch step up x4 BL (CG)   TREATMENT 05/05/2024:  Therapeutic Exercise: Patient educated on HEP, POC, prognosis, and relevant tissues/anatomy.   Seated PPT 2x8x3s Seated alternating hip march  2x8x3s  Therapeutic Activity: STS 3x5 KF~100 degrees seated hinge/DL regression [id number]   TREATMENT 04/28/2024:  Therapeutic Exercise: supine LTR x8 Supine PPT x8x3s Patient educated on HEP, POC, prognosis, and relevant tissues/anatomy.                                                                                                                                      PATIENT EDUCATION:  Education details: HEP Person educated: Patient Education method: Explanation, Facilities Manager, Actor cues, Verbal cues, and Handouts Education comprehension: verbalized understanding and  returned demonstration  HOME EXERCISE PROGRAM: Access Code: [email or web address] URL: [email or web address] Date: 05/12/2024 Prepared by: Harlene Persons  Exercises - Supine pelvic tilit  - 2 x daily - 7 x weekly - 2 sets - 8 reps - Supine Lower Trunk Rotation  - 2 x daily - 7 x weekly - 2 sets - 8 reps - Sit to Stand with Arms Crossed  - 2-3 x daily - 7 x weekly - 2 sets - 3 reps - Seated Hip Flexion Toward Target  - 2-3 x daily - 5 x weekly - 3 sets - 3 reps - Supine March with Resistance Band  - 2 x daily - 7 x weekly - 1 sets - 10 reps - Hooklying Clamshell with Resistance  - 2 x daily - 7 x weekly - 1 sets - 10 reps  ASSESSMENT:  CLINICAL IMPRESSION: Patient tolerated treatment with no increases in pain with progressions in BL hip, LE, core loading, and CVP endurance. Reviewed PPT which required some verbal cues to complete correctly. Added supine resisted hip flexion and abduction with good tolerance. His HEP was updated. Overall, session tolerated well. Current deficits include: activity tolerance, core strength, and BL LE and hip strength. As a result, patient would continue to benefit from skilled PT to address said deficits via plan below.     EVAL: Patient is a 83 year old male who presents with chronic BL LBP without sciatica. Patient presents with deficits in: functional activity tolerance,  standing>10 minutes, walking>40 ft, balance, and general lower body and core weakness. As a result, the patient would benefit from skilled PT to address aforementioned deficits via plan below.   OBJECTIVE IMPAIRMENTS: decreased activity tolerance, decreased balance, decreased mobility, difficulty walking, decreased strength, and pain.   PERSONAL FACTORS: Age, Fitness, and 3+ comorbidities:   are also affecting patient's functional outcome.   REHAB POTENTIAL: Fair    CLINICAL DECISION MAKING: Evolving/moderate complexity  EVALUATION COMPLEXITY: Moderate   GOALS: Goals reviewed with patient? No  SHORT TERM GOALS: Target date: 05/19/2024    Patient will be able to do ADL at least 60% capacity to demonstrate improvements in lower body activity tolerance needed for ADL's  Baseline: 50% Goal status: INITIAL  2.  Patient will be able to stand at least 15 minutes without significant increases in pain to demonstrate improvements in lower body activity tolerance needed for ADL's  Baseline: 10 min Goal status: INITIAL   LONG TERM GOALS: Target date: 06/28/24  Patient will demonstrate 100% HEP compliance to show independence with self-management of condition  Baseline: 0% Goal status: INITIAL  2.  Patient will demonstrate a 5 point improvement in ODI to show improvements in ADL completion and overall QOL   Baseline: 13 Goal status: INITIAL  3.  Patient will decrease worst pain to 2/10 at most to improve ADL completion and overall QOL  Baseline: 5/10 Goal status: INITIAL  4.  Patient will be able to walk at least 120 ft to demonstrate improvements in lower body tolerance needed for community ambulation  Baseline: 40 ft Goal status: INITIAL   PLAN:  PT FREQUENCY: 1-2x/week  PT DURATION: 8 weeks  PLANNED INTERVENTIONS: 97110-Therapeutic exercises, 97530- Therapeutic activity, [id number]- Neuromuscular re-education, 97535- Self Care, [id number]- Manual therapy, and Patient/Family  education.  PLAN FOR NEXT SESSION: HEP assessment and progression, symptom modulation, and loading (isolated and/or functional). Manual therapy, aerobic, gait, and NME training as needed.    [place] Odessia Scot  PT, DPT      "

## 2024-05-19 ENCOUNTER — Ambulatory Visit

## 2024-05-21 ENCOUNTER — Ambulatory Visit

## 2024-05-21 DIAGNOSIS — L02232 Carbuncle of back [any part, except buttock]: Secondary | ICD-10-CM | POA: Diagnosis not present

## 2024-05-21 DIAGNOSIS — L02212 Cutaneous abscess of back [any part, except buttock]: Secondary | ICD-10-CM | POA: Diagnosis not present

## 2024-05-21 DIAGNOSIS — L080 Pyoderma: Secondary | ICD-10-CM | POA: Diagnosis not present

## 2024-05-22 DIAGNOSIS — L02212 Cutaneous abscess of back [any part, except buttock]: Secondary | ICD-10-CM | POA: Diagnosis not present

## 2024-05-22 DIAGNOSIS — L080 Pyoderma: Secondary | ICD-10-CM | POA: Diagnosis not present

## 2024-05-22 DIAGNOSIS — L02232 Carbuncle of back [any part, except buttock]: Secondary | ICD-10-CM | POA: Diagnosis not present

## 2024-05-25 ENCOUNTER — Encounter (HOSPITAL_COMMUNITY): Payer: Self-pay | Admitting: *Deleted

## 2024-05-25 ENCOUNTER — Emergency Department (HOSPITAL_COMMUNITY)
Admission: EM | Admit: 2024-05-25 | Discharge: 2024-05-25 | Disposition: A | Discharge status: Home / Self Care | Attending: Emergency Medicine | Admitting: Emergency Medicine

## 2024-05-25 ENCOUNTER — Other Ambulatory Visit: Payer: Self-pay

## 2024-05-25 DIAGNOSIS — L02212 Cutaneous abscess of back [any part, except buttock]: Secondary | ICD-10-CM | POA: Insufficient documentation

## 2024-05-25 DIAGNOSIS — F17200 Nicotine dependence, unspecified, uncomplicated: Secondary | ICD-10-CM | POA: Insufficient documentation

## 2024-05-25 DIAGNOSIS — I10 Essential (primary) hypertension: Secondary | ICD-10-CM | POA: Insufficient documentation

## 2024-05-25 DIAGNOSIS — R109 Unspecified abdominal pain: Secondary | ICD-10-CM | POA: Insufficient documentation

## 2024-05-25 DIAGNOSIS — L03311 Cellulitis of abdominal wall: Secondary | ICD-10-CM | POA: Diagnosis not present

## 2024-05-25 DIAGNOSIS — Z85118 Personal history of other malignant neoplasm of bronchus and lung: Secondary | ICD-10-CM | POA: Diagnosis not present

## 2024-05-25 DIAGNOSIS — L0291 Cutaneous abscess, unspecified: Secondary | ICD-10-CM

## 2024-05-25 DIAGNOSIS — L02211 Cutaneous abscess of abdominal wall: Secondary | ICD-10-CM | POA: Diagnosis not present

## 2024-05-25 DIAGNOSIS — L02219 Cutaneous abscess of trunk, unspecified: Secondary | ICD-10-CM

## 2024-05-25 LAB — CBC WITH DIFFERENTIAL/PLATELET
Abs Immature Granulocytes: 0.03 K/uL (ref 0.00–0.07)
Basophils Absolute: 0 K/uL (ref 0.0–0.1)
Basophils Relative: 0 %
Eosinophils Absolute: 0.1 K/uL (ref 0.0–0.5)
Eosinophils Relative: 1 %
HCT: 49.7 % (ref 39.0–52.0)
Hemoglobin: 16 g/dL (ref 13.0–17.0)
Immature Granulocytes: 0 %
Lymphocytes Relative: 11 %
Lymphs Abs: 0.8 K/uL (ref 0.7–4.0)
MCH: 32 pg (ref 26.0–34.0)
MCHC: 32.2 g/dL (ref 30.0–36.0)
MCV: 99.4 fL (ref 80.0–100.0)
Monocytes Absolute: 0.6 K/uL (ref 0.1–1.0)
Monocytes Relative: 8 %
Neutro Abs: 6 K/uL (ref 1.7–7.7)
Neutrophils Relative %: 80 %
Platelets: 266 K/uL (ref 150–400)
RBC: 5 MIL/uL (ref 4.22–5.81)
RDW: 14.3 % (ref 11.5–15.5)
WBC: 7.5 K/uL (ref 4.0–10.5)
nRBC: 0 % (ref 0.0–0.2)

## 2024-05-25 LAB — BASIC METABOLIC PANEL WITH GFR
Anion gap: 10 (ref 5–15)
BUN: 24 mg/dL — ABNORMAL HIGH (ref 8–23)
CO2: 24 mmol/L (ref 22–32)
Calcium: 9.2 mg/dL (ref 8.9–10.3)
Chloride: 107 mmol/L (ref 98–111)
Creatinine, Ser: 1 mg/dL (ref 0.61–1.24)
GFR, Estimated: >60 mL/min (ref >60)
Glucose, Bld: 92 mg/dL (ref 70–99)
Potassium: 4.4 mmol/L (ref 3.5–5.1)
Sodium: 141 mmol/L (ref 135–145)

## 2024-05-25 MED ORDER — SULFAMETHOXAZOLE-TRIMETHOPRIM 800-160 MG PO TABS
1.0000 | ORAL_TABLET | Freq: Once | ORAL | Status: AC
  Administered 2024-05-25: 1 via ORAL
  Filled 2024-05-25: qty 1

## 2024-05-25 MED ORDER — SULFAMETHOXAZOLE-TRIMETHOPRIM 800-160 MG PO TABS: 1.0000 | ORAL_TABLET | Freq: Two times a day (BID) | ORAL | 0 refills | Status: AC

## 2024-05-25 NOTE — ED Triage Notes (Signed)
 Pt was sent here from [facility] for worsening Furunkel which initially began last Tuesday.  Pt was seen in on Thursday at which time it was drained and he was started on doxycycline.  Pt was re-evaluated in the office on Friday and again today.  It is not improving and there is draining from area and skin around it is red and inflamed.  See pic on chart.  No fever and chills

## 2024-05-25 NOTE — Discharge Instructions (Signed)
 [name]:  Thank you for allowing us  to take care of you today.  We hope you begin feeling better soon. You were seen today for draining abscess on your back.  You have some evidence of cellulitis and abscess, it has already actively draining with 2 incision wounds.  Continue to milked out pus from these wounds as you can tolerate.  Additionally, I have switched your antibiotic from clindamycin to Bactrim.  Please take Bactrim twice a day for 7 days.  If the abscess continues to worsen or does not improve after the 7 days, return to the emergency department or to dermatology ED for further evaluation.  Additionally, I have referred you out to general surgery.  A lot of the times, these abscesses will recur because there is an internal cyst structure that needs to be removed surgically.  If this continues to happen, please make an appointment with general surgery to have this removed.  To-Do:  Please follow-up with your primary doctor within the next 2-3 days. It is important that you review any labs or imaging results (if any) that you had today with them. Your preliminary imaging results (if any) are attached. Please return to the Emergency Department or call 911 if you experience chest pain, shortness of breath, severe pain, severe fever, altered mental status, or have any reason to think that you need emergency medical care.  Thank you again.  Hope you feel better soon.  Department of Emergency Medicine

## 2024-05-25 NOTE — ED Notes (Signed)
 Pt wound dressed with non-adherent dressing and abd pad.

## 2024-05-25 NOTE — ED Triage Notes (Addendum)
 Patient sent sent from dermatology office for a abscess on his back. He reports they cut it open to drain it and was unable to drain it all. He states that the dermatologist said it looked worse today than it did Friday and sent him here for possible infection. He said the antibiotics he's been taking have not been effective.   Denies fever or chills, does not know if it has any drainage or redness.

## 2024-05-25 NOTE — ED Provider Notes (Signed)
 [place] [facility] Provider Note  MDM   HPI/ROS:  [name] is a 83 y.o. male with a medical history as below who presents A-fib, HTN, obesity, lung cancer, COPD who presents for back abscess.  Patient was sent here from [facility] dermatology for worsening furuncle which initially was assessed last Tuesday where he received incision and drainage and was started on doxycycline.  Patient was reevaluated today with increasing drainage and redness around the skin.  He denies any fevers, chills, does endorse some abdominal back pain around the incision site.  He states that he was unable to drain it completely because he cannot reach it.  On my initial evaluation, patient is:  -Vital signs stable. Patient afebrile, hemodynamically stable, and non-toxic appearing.  Physical exam is notable for: - 6 cm abscess with areas of fluctuance and induration around the central site with purulence, erythema with cellulitic changes    2 large incision wounds from prior I&D that are still open and actively draining.  With pressure, was able to drain approximately 20 cc more of thick purulent discharge.  BMP and CBC both without any abnormalities, no leukocytosis.  Given that incision sites are actively draining, I attempted with hemostats to break up any internal loculations although there was none immediate to the incision sites.  Given that he does not have any systemic symptoms, no leukocytosis and active incision and drainage sites plan for broadening with Bactrim, and encouraging follow-up with both his dermatologist as well as general surgery for removal of abscess site.  Interpretations, interventions, and the patient's course of care are documented below.    Clinical Course as of 05/28/24 1944  Mon May 25, 2024  [address] [facility] [address] [[place]]    Clinical Course User Index [SA] Billy Pal, MD     Disposition:  I discussed the plan for discharge with the  patient and/or their surrogate at bedside prior to discharge and they were in agreement with the plan and verbalized understanding of the return precautions provided. All questions answered to the best of my ability. Ultimately, the patient was discharged in stable condition with stable vital signs. I am reassured that they are capable of close follow up and good social support at home.   Clinical Impression: No diagnosis found.  Rx / DC Orders ED Discharge Orders     None       Clinical Complexity A medically appropriate history, review of systems, and physical exam was performed.  My independent interpretations of EKG, labs, and radiology are documented in the ED course above.   If decision rules were used in this patient's evaluation, they are listed below.   Click here for ABCD2, HEART and other calculatorsREFRESH Note before signing   Patient's presentation is most consistent with acute complicated illness / injury requiring diagnostic workup.  Medical Decision Making Risk Prescription drug management.    HPI/ROS      See MDM section for pertinent HPI and ROS. A complete ROS was performed with pertinent positives/negatives noted above.   Past Medical History:  Diagnosis Date   A-fib [facility])    Acute appendicitis with peritoneal abscess 01/28/2013   Allergy    seasonal    Anxiety    Arthritis    Bacteremia September 2015   Elevated PSA    Essential hypertension 04/16/2014   Heart murmur    childhood   Ileus, postoperative (HCC) 01/28/2013   Lung cancer (HCC) 2016   Obesity, morbid (HCC)  01/28/2013   Shortness of breath    Tobacco use disorder 01/28/2013    Past Surgical History:  Procedure Laterality Date   APPENDECTOMY     BACK SURGERY  1997, 2003   Dr.Apleton    CARPAL TUNNEL RELEASE     COLONOSCOPY     x 3   EYE SURGERY Left    muscle of the eye    KNEE SURGERY Bilateral    LAPAROSCOPIC APPENDECTOMY N/A 01/25/2013   Procedure: APPENDECTOMY  LAPAROSCOPIC;  Surgeon: Lynda Leos, MD;  Location: [facility] OR;  Service: General;  Laterality: N/A;   LYMPH NODE DISSECTION Left 04/26/2015   Procedure: LYMPH NODE DISSECTION;  Surgeon: Dallas KATHEE Jude, MD;  Location: [facility] OR;  Service: Thoracic;  Laterality: Left;   POLYPECTOMY     SHOULDER SURGERY Right 2014   Dr.Whitfield rotator cuff surgery   SKIN SURGERY Right 01/2019   Right Leg, cancerous area removed   SKIN SURGERY  01/01/2022   Right hand (wrist and between thumb and forefinger). [facility]   TEE WITHOUT CARDIOVERSION N/A 04/07/2014   Procedure: TRANSESOPHAGEAL ECHOCARDIOGRAM (TEE);  Surgeon: Oneil Parchment, MD;  Location: [facility] ENDOSCOPY;  Service: Cardiovascular;  Laterality: N/A;   VIDEO ASSISTED THORACOSCOPY (VATS)/WEDGE RESECTION Left 04/26/2015   Procedure: LEFT VIDEO ASSISTED THORACOSCOPY (VATS) WITH LEFT LOWER LOBECTOMY;  Surgeon: Dallas KATHEE Jude, MD;  Location: [facility] OR;  Service: Thoracic;  Laterality: Left;   VIDEO BRONCHOSCOPY N/A 04/26/2015   Procedure: VIDEO BRONCHOSCOPY;  Surgeon: Dallas KATHEE Jude, MD;  Location: [facility] OR;  Service: Thoracic;  Laterality: N/A;      Physical Exam   Vitals:   05/25/24 1220 05/25/24 1438  BP: (!) 141/59 (!) 137/53  Pulse: 73 68  Resp: 20 20  Temp: 98.1 F (36.7 C)   SpO2: 96% 97%    Physical Exam Vitals and nursing note reviewed.  Constitutional:      General: He is not in acute distress.    Appearance: He is well-developed.  HENT:     Head: Normocephalic and atraumatic.  Eyes:     Conjunctiva/sclera: Conjunctivae normal.  Cardiovascular:     Rate and Rhythm: Normal rate and regular rhythm.     Heart sounds: No murmur heard. Pulmonary:     Effort: Pulmonary effort is normal. No respiratory distress.     Breath sounds: Normal breath sounds.  Abdominal:     Palpations: Abdomen is soft.     Tenderness: There is no abdominal tenderness.  Musculoskeletal:        General: No swelling.     Cervical back: Neck supple.   Skin:    General: Skin is warm and dry.     Comments: 6 cm abscess with areas of fluctuance and induration around the central site with purulence, erythema with cellulitic chang  Neurological:     Mental Status: He is alert.  Psychiatric:        Mood and Affect: Mood normal.      Procedures   If procedures were preformed on this patient, they are listed below:  Procedures   Please note that this documentation was produced with the assistance of voice-to-text technology and may contain errors.     Billy Pal, MD 05/28/24 1949    Tegeler, Lonni PARAS, MD 05/29/24 [phone]

## 2024-05-25 NOTE — ED Provider Triage Note (Signed)
 Emergency Medicine Provider Triage Evaluation Note  [name] , [name] 83 y.o. male  was evaluated in triage.  Pt complains of cellulitis and abscess.  Seen by dermatology on Friday with abscess to posterior upper back that had been there for [name] week.  Had incision and drainage as well as placed on doxycycline.  Was seen back today in clinic noted to have worsening cellulitis.  Sent in for IV abx. Per patient did not have good drainage due to loculation.  Review of Systems  Positive: Abscess, cellulitis Negative:   Physical Exam  BP (!) 141/59 (BP Location: Left Arm)   Pulse 73   Temp 98.1 F (36.7 C)   Resp 20   SpO2 96%  Gen:   Awake, no distress   Resp:  Normal effort  MSK:   Moves extremities without difficulty  Other:  Cellulitis, abscess right upper posterior back  Medical Decision Making  Medically screening exam initiated at 12:48 PM.  Appropriate orders placed.  [name] was informed that the remainder of the evaluation will be completed by another provider, this initial triage assessment does not replace that evaluation, and the importance of remaining in the ED until their evaluation is complete.  cellulitis   Kurt Sheller A, PA-C 05/25/24 1250

## 2024-05-26 ENCOUNTER — Ambulatory Visit

## 2024-05-27 DIAGNOSIS — L02232 Carbuncle of back [any part, except buttock]: Secondary | ICD-10-CM | POA: Diagnosis not present

## 2024-05-28 ENCOUNTER — Ambulatory Visit

## 2024-05-29 DIAGNOSIS — L02232 Carbuncle of back [any part, except buttock]: Secondary | ICD-10-CM | POA: Diagnosis not present

## 2024-06-01 ENCOUNTER — Ambulatory Visit: Payer: Self-pay | Admitting: Surgery

## 2024-06-01 DIAGNOSIS — L723 Sebaceous cyst: Secondary | ICD-10-CM | POA: Diagnosis not present

## 2024-06-01 DIAGNOSIS — L089 Local infection of the skin and subcutaneous tissue, unspecified: Secondary | ICD-10-CM | POA: Diagnosis not present

## 2024-06-01 NOTE — H&P (View-Only) (Signed)
 Subjective   Chief Complaint: New Consultation     History of Present Illness: [name] is a 83 y.o. male who is seen today as an office consultation at the request of Dr. Elnor for evaluation of New Consultation .   This is an 83 year old male with hypertension, hyperlipidemia who presents with a history of multiple sebaceous cyst excisions from his back.  Recently, the patient developed a sebaceous cyst that became infected and swollen.  He went to [facility] dermatology where they lanced this abscess.  He has been going there daily for dressing changes.  They continue to squeeze purulence and sebum from this area.  He is unable to change the dressing himself as he lives alone.   Review of Systems: A complete review of systems was obtained from the patient.  I have reviewed this information and discussed as appropriate with the patient.  See HPI as well for other ROS.  Review of Systems  Constitutional: Negative.   HENT:  Positive for congestion and hearing loss.   Eyes: Negative.   Respiratory:  Positive for shortness of breath.   Cardiovascular:  Positive for leg swelling.  Gastrointestinal: Negative.   Genitourinary: Negative.   Musculoskeletal: Negative.   Skin: Negative.   Neurological:  Positive for weakness.  Endo/Heme/Allergies: Negative.   Psychiatric/Behavioral: Negative.        Medical History: Past Medical History:  Diagnosis Date   A-fib (CMS/HHS-HCC)    Acute appendicitis with peritoneal abscess    Anxiety    Arthritis    Bacteremia    Elevated PSA    Essential hypertension    Heart murmur    Ileus, postoperative (CMS/HHS-HCC)    Lung cancer (CMS/HHS-HCC)    Obesity, morbid (CMS-HCC)    Tobacco use disorder     Patient Active Problem List  Diagnosis   DOE (dyspnea on exertion)   Essential hypertension   Hyperglycemia   Mixed hyperlipidemia   Obesity, morbid (CMS-HCC)   Tobacco use disorder    Past Surgical History:  Procedure Laterality  Date   Tee without cardioversion (N/A)  04/07/2014   Lymph node dissection (Left)  04/26/2015    Video assisted thoracoscopy (vats)/wedge resection (Left)   04/26/2015   Video bronchoscopy (N/A)   04/26/2015   Skin surgery (Right)   01/2019   APPENDECTOMY     COLONOSCOPY     ENDOSCOPIC CARPAL TUNNEL RELEASE     Knee surgery (Bilateral)     Polypectomy     Shoulder surgery (Right)       Allergies  Allergen Reactions   Aspirin  Anaphylaxis and Swelling   Naproxen Hives   Nsaids (Non-Steroidal Anti-Inflammatory Drug) Hives   Tolmetin Hives    Current Outpatient Medications on File Prior to Visit  Medication Sig Dispense Refill   atorvastatin  (LIPITOR) 10 MG tablet Take 10 mg by mouth once daily     doxycycline (VIBRA-TABS) 100 MG tablet TAKE 1 TABLET BY MOUTH TWICE DAILY FOR 10 DAYS WITH FOOD AND A FULL GLASS OF WATER     hydrALAZINE  (APRESOLINE ) 25 MG tablet Take 25 mg by mouth 3 (three) times daily     losartan  (COZAAR ) 50 MG tablet Take 50 mg by mouth once daily     metoprolol  TARTrate (LOPRESSOR ) 25 MG tablet Take 12.5 mg by mouth 2 (two) times daily     sulfamethoxazole-trimethoprim (BACTRIM DS) 800-160 mg tablet Take 1 tablet by mouth 2 (two) times daily     tamsulosin  (FLOMAX ) 0.4 mg  capsule Take 0.4 mg by mouth at bedtime     TRELEGY ELLIPTA  100-62.5-25 mcg inhaler INHALE 1 PUFF BY MOUTH ONCE DAILY . APPOINTMENT REQUIRED FOR FUTURE REFILLS     No current facility-administered medications on file prior to visit.    Family History  Problem Relation Age of Onset   Irritable bowel syndrome Mother    Cancer Neg Hx    Lung disease Neg Hx    Colon polyps Neg Hx    Esophageal cancer Neg Hx    Rectal cancer Neg Hx    Stomach cancer Neg Hx    Colon cancer Neg Hx      Social History   Tobacco Use  Smoking Status Every Day   Types: Cigarettes  Smokeless Tobacco Former   Types: Chew     Social History   Socioeconomic History   Marital status: Divorced  Tobacco Use    Smoking status: Every Day    Types: Cigarettes   Smokeless tobacco: Former    Types: Chew  Substance and Sexual Activity   Drug use: Never   Social Drivers of Corporate Investment Banker Strain: Low Risk  (04/06/2024)   Received from [facility]   Overall Financial Resource Strain (CARDIA)    How hard is it for you to pay for the very basics like food, housing, medical care, and heating?: Not very hard  Food Insecurity: No Food Insecurity (04/06/2024)   Received from [facility]   Hunger Vital Sign    Within the past 12 months, you worried that your food would run out before you got the money to buy more.: Never true    Within the past 12 months, the food you bought just didn't last and you didn't have money to get more.: Never true  Transportation Needs: No Transportation Needs (04/06/2024)   Received from [facility] - Transportation    In the past 12 months, has lack of transportation kept you from medical appointments or from getting medications?: No    In the past 12 months, has lack of transportation kept you from meetings, work, or from getting things needed for daily living?: No  Physical Activity: Inactive (04/06/2024)   Received from [facility]   Exercise Vital Sign    On average, how many days per week do you engage in moderate to strenuous exercise (like a brisk walk)?: 0 days  Stress: No Stress Concern Present (04/06/2024)   Received from [facility] - Occupational Stress Questionnaire    Do you feel stress - tense, restless, nervous, or anxious, or unable to sleep at night because your mind is troubled all the time - these days?: Not at all  Social Connections: Moderately Integrated (04/06/2024)   Received from [facility]   Social Connection and Isolation Panel    In a typical week, how many times do you talk on the phone with family, friends, or neighbors?: Three times a week    How often do you get together with  friends or relatives?: Once a week    How often do you attend church or religious services?: More than 4 times per year    Do you belong to any clubs or organizations such as church groups, unions, fraternal or athletic groups, or school groups?: Yes    How often do you attend meetings of the clubs or organizations you belong to?: More than 4 times per year  Are you married, widowed, divorced, separated, never married, or living with a partner?: Divorced  Housing Stability: Unknown (06/01/2024)   Housing Stability Vital Sign    Homeless in the Last Year: No    Objective:    Vitals:   06/01/24 0944  BP: 133/68  Pulse: 68  Temp: 36.7 C (98 F)  SpO2: 97%  Weight: (!) 107 kg (236 lb)  Height: 167.6 cm (5' 6)    Body mass index is 38.09 kg/m.  Physical Exam   Constitutional:  WDWN in NAD, conversant, no obvious deformities; lying in bed comfortably Eyes:  Pupils equal, round; sclera anicteric; moist conjunctiva; no lid lag HENT:  Oral mucosa moist; good dentition  Neck:  No masses palpated, trachea midline; no thyromegaly Lungs:  CTA bilaterally; normal respiratory effort Breasts:  symmetric, no nipple changes; no palpable masses or lymphadenopathy on either side CV:  Regular rate and rhythm; no murmurs; extremities well-perfused with no edema Abd:  +bowel sounds, soft, non-tender, no palpable organomegaly; no palpable hernias Musc:  gait; no apparent clubbing or cyanosis in extremities Lymphatic:  No palpable cervical or axillary lymphadenopathy Back: Multiple healed scars.  The patient has multiple palpable sebaceous cyst scattered across his back.  There is 1 on the right side of his back that measures about 3 cm in diameter.  There is opening below this and there is purulent fluid and sebum coming from the opening.  The area is fairly tender.  The other cyst measure 3 cm, 2 cm, 2 cm, 2 cm. Skin:  Warm, dry; no sign of jaundice Psychiatric - alert and oriented x 4; calm mood  and affect    Assessment and Plan:  Diagnoses and all orders for this visit:  Infected sebaceous cyst     The patient has multiple sebaceous cyst of the back.  At least one of these are infected and draining purulent fluid.  Recommend excision of all of the sebaceous cysts of the back that are currently present.  This is a total of 5.  1 of these is infected and will likely need to be left open.  The surgical procedure has been discussed with the patient.  Potential risks, benefits, alternative treatments, and expected outcomes have been explained.  All of the patient's questions at this time have been answered.  The likelihood of reaching the patient's treatment goal is good.  The patient understands the proposed surgical procedure and wishes to proceed. We will try to schedule this urgently.  Kurt Racine DEWAYNE LIMA, [name]  06/01/2024 10:05 AM

## 2024-06-01 NOTE — H&P (Signed)
 Subjective   Chief Complaint: New Consultation     History of Present Illness: [name] is a 83 y.o. male who is seen today as an office consultation at the request of Dr. Elnor for evaluation of New Consultation .   This is an 83 year old male with hypertension, hyperlipidemia who presents with a history of multiple sebaceous cyst excisions from his back.  Recently, the patient developed a sebaceous cyst that became infected and swollen.  He went to [facility] dermatology where they lanced this abscess.  He has been going there daily for dressing changes.  They continue to squeeze purulence and sebum from this area.  He is unable to change the dressing himself as he lives alone.   Review of Systems: A complete review of systems was obtained from the patient.  I have reviewed this information and discussed as appropriate with the patient.  See HPI as well for other ROS.  Review of Systems  Constitutional: Negative.   HENT:  Positive for congestion and hearing loss.   Eyes: Negative.   Respiratory:  Positive for shortness of breath.   Cardiovascular:  Positive for leg swelling.  Gastrointestinal: Negative.   Genitourinary: Negative.   Musculoskeletal: Negative.   Skin: Negative.   Neurological:  Positive for weakness.  Endo/Heme/Allergies: Negative.   Psychiatric/Behavioral: Negative.        Medical History: Past Medical History:  Diagnosis Date   A-fib (CMS/HHS-HCC)    Acute appendicitis with peritoneal abscess    Anxiety    Arthritis    Bacteremia    Elevated PSA    Essential hypertension    Heart murmur    Ileus, postoperative (CMS/HHS-HCC)    Lung cancer (CMS/HHS-HCC)    Obesity, morbid (CMS-HCC)    Tobacco use disorder     Patient Active Problem List  Diagnosis   DOE (dyspnea on exertion)   Essential hypertension   Hyperglycemia   Mixed hyperlipidemia   Obesity, morbid (CMS-HCC)   Tobacco use disorder    Past Surgical History:  Procedure Laterality  Date   Tee without cardioversion (N/A)  04/07/2014   Lymph node dissection (Left)  04/26/2015    Video assisted thoracoscopy (vats)/wedge resection (Left)   04/26/2015   Video bronchoscopy (N/A)   04/26/2015   Skin surgery (Right)   01/2019   APPENDECTOMY     COLONOSCOPY     ENDOSCOPIC CARPAL TUNNEL RELEASE     Knee surgery (Bilateral)     Polypectomy     Shoulder surgery (Right)       Allergies  Allergen Reactions   Aspirin  Anaphylaxis and Swelling   Naproxen Hives   Nsaids (Non-Steroidal Anti-Inflammatory Drug) Hives   Tolmetin Hives    Current Outpatient Medications on File Prior to Visit  Medication Sig Dispense Refill   atorvastatin  (LIPITOR) 10 MG tablet Take 10 mg by mouth once daily     doxycycline (VIBRA-TABS) 100 MG tablet TAKE 1 TABLET BY MOUTH TWICE DAILY FOR 10 DAYS WITH FOOD AND A FULL GLASS OF WATER     hydrALAZINE  (APRESOLINE ) 25 MG tablet Take 25 mg by mouth 3 (three) times daily     losartan  (COZAAR ) 50 MG tablet Take 50 mg by mouth once daily     metoprolol  TARTrate (LOPRESSOR ) 25 MG tablet Take 12.5 mg by mouth 2 (two) times daily     sulfamethoxazole-trimethoprim (BACTRIM DS) 800-160 mg tablet Take 1 tablet by mouth 2 (two) times daily     tamsulosin  (FLOMAX ) 0.4 mg  capsule Take 0.4 mg by mouth at bedtime     TRELEGY ELLIPTA  100-62.5-25 mcg inhaler INHALE 1 PUFF BY MOUTH ONCE DAILY . APPOINTMENT REQUIRED FOR FUTURE REFILLS     No current facility-administered medications on file prior to visit.    Family History  Problem Relation Age of Onset   Irritable bowel syndrome Mother    Cancer Neg Hx    Lung disease Neg Hx    Colon polyps Neg Hx    Esophageal cancer Neg Hx    Rectal cancer Neg Hx    Stomach cancer Neg Hx    Colon cancer Neg Hx      Social History   Tobacco Use  Smoking Status Every Day   Types: Cigarettes  Smokeless Tobacco Former   Types: Chew     Social History   Socioeconomic History   Marital status: Divorced  Tobacco Use    Smoking status: Every Day    Types: Cigarettes   Smokeless tobacco: Former    Types: Chew  Substance and Sexual Activity   Drug use: Never   Social Drivers of Corporate Investment Banker Strain: Low Risk  (04/06/2024)   Received from [facility]   Overall Financial Resource Strain (CARDIA)    How hard is it for you to pay for the very basics like food, housing, medical care, and heating?: Not very hard  Food Insecurity: No Food Insecurity (04/06/2024)   Received from [facility]   Hunger Vital Sign    Within the past 12 months, you worried that your food would run out before you got the money to buy more.: Never true    Within the past 12 months, the food you bought just didn't last and you didn't have money to get more.: Never true  Transportation Needs: No Transportation Needs (04/06/2024)   Received from [facility] - Transportation    In the past 12 months, has lack of transportation kept you from medical appointments or from getting medications?: No    In the past 12 months, has lack of transportation kept you from meetings, work, or from getting things needed for daily living?: No  Physical Activity: Inactive (04/06/2024)   Received from [facility]   Exercise Vital Sign    On average, how many days per week do you engage in moderate to strenuous exercise (like a brisk walk)?: 0 days  Stress: No Stress Concern Present (04/06/2024)   Received from [facility] - Occupational Stress Questionnaire    Do you feel stress - tense, restless, nervous, or anxious, or unable to sleep at night because your mind is troubled all the time - these days?: Not at all  Social Connections: Moderately Integrated (04/06/2024)   Received from [facility]   Social Connection and Isolation Panel    In a typical week, how many times do you talk on the phone with family, friends, or neighbors?: Three times a week    How often do you get together with  friends or relatives?: Once a week    How often do you attend church or religious services?: More than 4 times per year    Do you belong to any clubs or organizations such as church groups, unions, fraternal or athletic groups, or school groups?: Yes    How often do you attend meetings of the clubs or organizations you belong to?: More than 4 times per year  Are you married, widowed, divorced, separated, never married, or living with a partner?: Divorced  Housing Stability: Unknown (06/01/2024)   Housing Stability Vital Sign    Homeless in the Last Year: No    Objective:    Vitals:   06/01/24 0944  BP: 133/68  Pulse: 68  Temp: 36.7 C (98 F)  SpO2: 97%  Weight: (!) 107 kg (236 lb)  Height: 167.6 cm (5' 6)    Body mass index is 38.09 kg/m.  Physical Exam   Constitutional:  WDWN in NAD, conversant, no obvious deformities; lying in bed comfortably Eyes:  Pupils equal, round; sclera anicteric; moist conjunctiva; no lid lag HENT:  Oral mucosa moist; good dentition  Neck:  No masses palpated, trachea midline; no thyromegaly Lungs:  CTA bilaterally; normal respiratory effort Breasts:  symmetric, no nipple changes; no palpable masses or lymphadenopathy on either side CV:  Regular rate and rhythm; no murmurs; extremities well-perfused with no edema Abd:  +bowel sounds, soft, non-tender, no palpable organomegaly; no palpable hernias Musc:  gait; no apparent clubbing or cyanosis in extremities Lymphatic:  No palpable cervical or axillary lymphadenopathy Back: Multiple healed scars.  The patient has multiple palpable sebaceous cyst scattered across his back.  There is 1 on the right side of his back that measures about 3 cm in diameter.  There is opening below this and there is purulent fluid and sebum coming from the opening.  The area is fairly tender.  The other cyst measure 3 cm, 2 cm, 2 cm, 2 cm. Skin:  Warm, dry; no sign of jaundice Psychiatric - alert and oriented x 4; calm mood  and affect    Assessment and Plan:  Diagnoses and all orders for this visit:  Infected sebaceous cyst     The patient has multiple sebaceous cyst of the back.  At least one of these are infected and draining purulent fluid.  Recommend excision of all of the sebaceous cysts of the back that are currently present.  This is a total of 5.  1 of these is infected and will likely need to be left open.  The surgical procedure has been discussed with the patient.  Potential risks, benefits, alternative treatments, and expected outcomes have been explained.  All of the patient's questions at this time have been answered.  The likelihood of reaching the patient's treatment goal is good.  The patient understands the proposed surgical procedure and wishes to proceed. We will try to schedule this urgently.  Caterina Racine DEWAYNE LIMA, MD  06/01/2024 10:05 AM

## 2024-06-02 DIAGNOSIS — L821 Other seborrheic keratosis: Secondary | ICD-10-CM | POA: Diagnosis not present

## 2024-06-02 DIAGNOSIS — Z08 Encounter for follow-up examination after completed treatment for malignant neoplasm: Secondary | ICD-10-CM | POA: Diagnosis not present

## 2024-06-02 DIAGNOSIS — D1801 Hemangioma of skin and subcutaneous tissue: Secondary | ICD-10-CM | POA: Diagnosis not present

## 2024-06-02 DIAGNOSIS — Z961 Presence of intraocular lens: Secondary | ICD-10-CM | POA: Diagnosis not present

## 2024-06-02 DIAGNOSIS — L02232 Carbuncle of back [any part, except buttock]: Secondary | ICD-10-CM | POA: Diagnosis not present

## 2024-06-02 DIAGNOSIS — Z85828 Personal history of other malignant neoplasm of skin: Secondary | ICD-10-CM | POA: Diagnosis not present

## 2024-06-02 DIAGNOSIS — D225 Melanocytic nevi of trunk: Secondary | ICD-10-CM | POA: Diagnosis not present

## 2024-06-02 DIAGNOSIS — L814 Other melanin hyperpigmentation: Secondary | ICD-10-CM | POA: Diagnosis not present

## 2024-06-02 DIAGNOSIS — H0102B Squamous blepharitis left eye, upper and lower eyelids: Secondary | ICD-10-CM | POA: Diagnosis not present

## 2024-06-02 DIAGNOSIS — H0102A Squamous blepharitis right eye, upper and lower eyelids: Secondary | ICD-10-CM | POA: Diagnosis not present

## 2024-06-04 ENCOUNTER — Encounter (HOSPITAL_COMMUNITY): Payer: Self-pay | Admitting: Surgery

## 2024-06-04 ENCOUNTER — Other Ambulatory Visit: Payer: Self-pay

## 2024-06-04 NOTE — Anesthesia Preprocedure Evaluation (Addendum)
 Anesthesia Evaluation  Patient identified by MRN, date of birth, ID band Patient awake    Reviewed: Allergy & Precautions, NPO status , Patient's Chart, lab work & pertinent test results, reviewed documented beta blocker date and time   History of Anesthesia Complications Negative for: history of anesthetic complications  Airway Mallampati: II  TM Distance: <3 FB Neck ROM: Full    Dental  (+) Teeth Intact, Edentulous Upper, Missing, Poor Dentition, Dental Advisory Given   Pulmonary neg sleep apnea, COPD, neg recent URI, Current Smoker and Patient abstained from smoking. Hx of sarcomatoid carcinoma s/p lobectomy   breath sounds clear to auscultation       Cardiovascular hypertension, Pt. on medications and Pt. on home beta blockers + dysrhythmias Atrial Fibrillation  Rhythm:Regular Rate:Normal  TTE 05/18/2021: 1. Left ventricular ejection fraction, by estimation, is 60 to 65%. The  left ventricle has normal function. The left ventricle has no regional  wall motion abnormalities. Left ventricular diastolic parameters were  normal.   2. Right ventricular systolic function is normal. The right ventricular  size is normal. There is mildly elevated pulmonary artery systolic  pressure. The estimated right ventricular systolic pressure is 41.4 mmHg.   3. The mitral valve is degenerative. Trivial mitral valve regurgitation.  No evidence of mitral stenosis.   4. The aortic valve is tricuspid. There is mild calcification of the  aortic valve. Aortic valve regurgitation is not visualized. Mild aortic  valve sclerosis is present, with no evidence of aortic valve stenosis.   5. The inferior vena cava is normal in size with greater than 50%  respiratory variability, suggesting right atrial pressure of 3 mmHg.     Neuro/Psych neg Seizures    GI/Hepatic ,neg GERD  ,,  Endo/Other  neg diabetes    Renal/GU      Musculoskeletal    Abdominal   Peds  Hematology Hgb 16.0, Plts 266K (05/25/24)   Anesthesia Other Findings   Reproductive/Obstetrics                              Anesthesia Physical Anesthesia Plan  ASA: 3  Anesthesia Plan: General   Post-op Pain Management:    Induction: Intravenous  PONV Risk Score and Plan: 1 and Ondansetron , Dexamethasone  and Treatment may vary due to age or medical condition  Airway Management Planned: Oral ETT  Additional Equipment: None  Intra-op Plan:   Post-operative Plan: Extubation in OR  Informed Consent:      Dental advisory given  Plan Discussed with: CRNA  Anesthesia Plan Comments: (PAT note by Lynwood Hope, PA-C: 83 year old male with pertinent history including current smoker, sarcomatoid carcinoma s/p left lower lobectomy 2016, HTN, HLD, diastolic dysfunction, paroxysmal atrial fibrillation.  Patient had an episode of A-fib with RVR in 2015 in the setting of acute infection.  He converted to normal sinus rhythm with amiodarone  and diltiazem .  TEE was reassuring.  He had brief postoperative atrial fibrillation following VATS in 2016; immediately converted with amiodarone .  Anticoagulation not recommended.  Last seen by cardiologist Dr. Jeffrie 09/2016, stable at that time, recommended continue beta-blocker, follow-up with cardiology on an as-needed basis.  BMP and CBC 05/25/2024 reviewed, unremarkable.  Patient will need day of surgery evaluation.  EKG 10/07/2023: Sinus Rhythm.  Rate 63. Low voltage in limb leads. -Left axis -anterior fascicular block. -Left atrial enlargement. 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  CT chest 10/21/2023: IMPRESSION: 1. The right lower lobe  airspace disease has resolved. 2. Status post left lower lobectomy, without recurrent or metastatic disease. Similar borderline to mild middle and anterior mediastinal adenopathy is similar, most likely reactive. Recommend attention on follow-up. 3.  Pulmonary artery enlargement suggests pulmonary arterial hypertension. 4. Aortic atherosclerosis (ICD10-I70.0), coronary artery atherosclerosis and emphysema (ICD10-J43.9).  TTE 05/18/2021: 1. Left ventricular ejection fraction, by estimation, is 60 to 65%. The  left ventricle has normal function. The left ventricle has no regional  wall motion abnormalities. Left ventricular diastolic parameters were  normal.  2. Right ventricular systolic function is normal. The right ventricular  size is normal. There is mildly elevated pulmonary artery systolic  pressure. The estimated right ventricular systolic pressure is 41.4 mmHg.  3. The mitral valve is degenerative. Trivial mitral valve regurgitation.  No evidence of mitral stenosis.  4. The aortic valve is tricuspid. There is mild calcification of the  aortic valve. Aortic valve regurgitation is not visualized. Mild aortic  valve sclerosis is present, with no evidence of aortic valve stenosis.  5. The inferior vena cava is normal in size with greater than 50%  respiratory variability, suggesting right atrial pressure of 3 mmHg.    )         Anesthesia Quick Evaluation

## 2024-06-04 NOTE — Progress Notes (Signed)
 SDW CALL  Patient was given pre-op instructions over the phone. The opportunity was given for the patient to ask questions. No further questions asked. Patient verbalized understanding of instructions given.   PCP - Harlene An, NP Cardiologist - denies  PPM/ICD - denies Device Orders -  Rep Notified -   Chest x-ray - CT-10/21/23 EKG - 09/17/23 Stress Test - 04/19/15 ECHO - 05/18/21 Cardiac Cath - denies  Sleep Study - denies CPAP - no  Fasting Blood Sugar - na Checks Blood Sugar _____ times a day  Blood Thinner Instructions:na Aspirin  Instructions:na  ERAS Protcol -clear liquids until 0700 PRE-SURGERY Ensure or G2-   COVID TEST- na   Anesthesia review: yes-SDW-hx LLlobectomy,current smoker,HTN,venous insufficiency,hyperglycemia,hyperlipidemia  pt also had afib after surgery and in the setting of severe infection. was seen by Dr. Jeffrie in 2018. Follow up was PRN. He has not seen cardiology since because no more occurrences.   Patient denies shortness of breath, fever, cough and chest pain over the phone call   Oral Hygiene is also important to reduce your risk of infection.  Remember - BRUSH YOUR TEETH THE MORNING OF SURGERY WITH YOUR REGULAR TOOTHPASTE

## 2024-06-04 NOTE — Progress Notes (Signed)
 Anesthesia Chart Review:  83 year old male with pertinent history including current smoker, sarcomatoid carcinoma s/p left lower lobectomy 2016, HTN, HLD, diastolic dysfunction, paroxysmal atrial fibrillation.  Patient had an episode of A-fib with RVR in 2015 in the setting of acute infection.  He converted to normal sinus rhythm with amiodarone  and diltiazem .  TEE was reassuring.  He had brief postoperative atrial fibrillation following VATS in 2016; immediately converted with amiodarone .  Anticoagulation not recommended.  Last seen by cardiologist Dr. Jeffrie 09/2016, stable at that time, recommended continue beta-blocker, follow-up with cardiology on an as-needed basis.  BMP and CBC 05/25/2024 reviewed, unremarkable.  Patient will need day of surgery evaluation.  EKG 10/07/2023: Sinus Rhythm.  Rate 63. Low voltage in limb leads. -Left axis -anterior fascicular block. -Left atrial enlargement. 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  CT chest 10/21/2023: IMPRESSION: 1. The right lower lobe airspace disease has resolved. 2. Status post left lower lobectomy, without recurrent or metastatic disease. Similar borderline to mild middle and anterior mediastinal adenopathy is similar, most likely reactive. Recommend attention on follow-up. 3. Pulmonary artery enlargement suggests pulmonary arterial hypertension. 4. Aortic atherosclerosis (ICD10-I70.0), coronary artery atherosclerosis and emphysema (ICD10-J43.9).  TTE 05/18/2021: 1. Left ventricular ejection fraction, by estimation, is 60 to 65%. The  left ventricle has normal function. The left ventricle has no regional  wall motion abnormalities. Left ventricular diastolic parameters were  normal.   2. Right ventricular systolic function is normal. The right ventricular  size is normal. There is mildly elevated pulmonary artery systolic  pressure. The estimated right ventricular systolic pressure is 41.4 mmHg.   3. The mitral valve is  degenerative. Trivial mitral valve regurgitation.  No evidence of mitral stenosis.   4. The aortic valve is tricuspid. There is mild calcification of the  aortic valve. Aortic valve regurgitation is not visualized. Mild aortic  valve sclerosis is present, with no evidence of aortic valve stenosis.   5. The inferior vena cava is normal in size with greater than 50%  respiratory variability, suggesting right atrial pressure of 3 mmHg.     Lynwood Geofm RIGGERS [facility]/Anesthesiology Phone [phone] 06/04/2024 11:45 AM

## 2024-06-05 ENCOUNTER — Encounter (HOSPITAL_COMMUNITY)
Admission: RE | Disposition: A | Discharge status: Home / Self Care | Payer: Self-pay | Source: Home / Self Care | Attending: Surgery

## 2024-06-05 ENCOUNTER — Other Ambulatory Visit (HOSPITAL_COMMUNITY): Payer: Self-pay

## 2024-06-05 ENCOUNTER — Encounter (HOSPITAL_COMMUNITY): Payer: Self-pay | Admitting: Surgery

## 2024-06-05 ENCOUNTER — Ambulatory Visit (HOSPITAL_COMMUNITY): Payer: Self-pay | Admitting: Physician Assistant

## 2024-06-05 ENCOUNTER — Ambulatory Visit (HOSPITAL_COMMUNITY)
Admission: RE | Admit: 2024-06-05 | Discharge: 2024-06-05 | Disposition: A | Discharge status: Home / Self Care | Attending: Surgery | Admitting: Surgery

## 2024-06-05 DIAGNOSIS — F17210 Nicotine dependence, cigarettes, uncomplicated: Secondary | ICD-10-CM | POA: Insufficient documentation

## 2024-06-05 DIAGNOSIS — L723 Sebaceous cyst: Secondary | ICD-10-CM | POA: Diagnosis not present

## 2024-06-05 DIAGNOSIS — Z79899 Other long term (current) drug therapy: Secondary | ICD-10-CM | POA: Insufficient documentation

## 2024-06-05 DIAGNOSIS — E782 Mixed hyperlipidemia: Secondary | ICD-10-CM | POA: Diagnosis not present

## 2024-06-05 DIAGNOSIS — I480 Paroxysmal atrial fibrillation: Secondary | ICD-10-CM | POA: Diagnosis not present

## 2024-06-05 DIAGNOSIS — L720 Epidermal cyst: Secondary | ICD-10-CM | POA: Insufficient documentation

## 2024-06-05 DIAGNOSIS — I10 Essential (primary) hypertension: Secondary | ICD-10-CM

## 2024-06-05 DIAGNOSIS — Z902 Acquired absence of lung [part of]: Secondary | ICD-10-CM | POA: Diagnosis not present

## 2024-06-05 DIAGNOSIS — Z85118 Personal history of other malignant neoplasm of bronchus and lung: Secondary | ICD-10-CM | POA: Diagnosis not present

## 2024-06-05 DIAGNOSIS — I4891 Unspecified atrial fibrillation: Secondary | ICD-10-CM

## 2024-06-05 HISTORY — PX: EXCISION OF BACK LESION: SHX6597

## 2024-06-05 SURGERY — EXCISION, LESION, BACK
Anesthesia: General

## 2024-06-05 MED ORDER — ROCURONIUM BROMIDE 10 MG/ML (PF) SYRINGE
PREFILLED_SYRINGE | INTRAVENOUS | Status: AC
  Filled 2024-06-05: qty 10

## 2024-06-05 MED ORDER — ONDANSETRON HCL 4 MG/2ML IJ SOLN
INTRAMUSCULAR | Status: AC
  Filled 2024-06-05: qty 2

## 2024-06-05 MED ORDER — EPHEDRINE SULFATE-NACL 50-0.9 MG/10ML-% IV SOSY
PREFILLED_SYRINGE | INTRAVENOUS | Status: DC | PRN
  Administered 2024-06-05 (×2): 10 mg via INTRAVENOUS
  Administered 2024-06-05: 5 mg via INTRAVENOUS

## 2024-06-05 MED ORDER — FENTANYL CITRATE (PF) 250 MCG/5ML IJ SOLN
INTRAMUSCULAR | Status: DC | PRN
  Administered 2024-06-05 (×2): 50 ug via INTRAVENOUS

## 2024-06-05 MED ORDER — ALBUTEROL SULFATE HFA 108 (90 BASE) MCG/ACT IN AERS
INHALATION_SPRAY | RESPIRATORY_TRACT | Status: DC | PRN
Start: 2024-06-05 — End: 2024-06-05
  Administered 2024-06-05 (×2): 2 via RESPIRATORY_TRACT

## 2024-06-05 MED ORDER — ACETAMINOPHEN 10 MG/ML IV SOLN: 1000.0000 mg | Freq: Once | INTRAVENOUS | Status: DC | PRN

## 2024-06-05 MED ORDER — PHENYLEPHRINE 80 MCG/ML (10ML) SYRINGE FOR IV PUSH (FOR BLOOD PRESSURE SUPPORT)
PREFILLED_SYRINGE | INTRAVENOUS | Status: DC | PRN
  Administered 2024-06-05: 160 ug via INTRAVENOUS
  Administered 2024-06-05 (×2): 80 ug via INTRAVENOUS

## 2024-06-05 MED ORDER — ORAL CARE MOUTH RINSE: 15.0000 mL | Freq: Once | OROMUCOSAL | Status: AC

## 2024-06-05 MED ORDER — SUGAMMADEX SODIUM 200 MG/2ML IV SOLN
INTRAVENOUS | Status: DC | PRN
  Administered 2024-06-05: 400 mg via INTRAVENOUS

## 2024-06-05 MED ORDER — FENTANYL CITRATE (PF) 100 MCG/2ML IJ SOLN
INTRAMUSCULAR | Status: AC
  Filled 2024-06-05: qty 2

## 2024-06-05 MED ORDER — CHLORHEXIDINE GLUCONATE CLOTH 2 % EX PADS: 6.0000 | MEDICATED_PAD | Freq: Once | CUTANEOUS | Status: DC

## 2024-06-05 MED ORDER — BUPIVACAINE-EPINEPHRINE (PF) 0.25% -1:200000 IJ SOLN
INTRAMUSCULAR | Status: AC
  Filled 2024-06-05: qty 30

## 2024-06-05 MED ORDER — PROPOFOL 10 MG/ML IV BOLUS
INTRAVENOUS | Status: DC | PRN
  Administered 2024-06-05: 100 mg via INTRAVENOUS
  Administered 2024-06-05: 30 mg via INTRAVENOUS

## 2024-06-05 MED ORDER — ROCURONIUM BROMIDE 10 MG/ML (PF) SYRINGE
PREFILLED_SYRINGE | INTRAVENOUS | Status: DC | PRN
  Administered 2024-06-05: 20 mg via INTRAVENOUS
  Administered 2024-06-05: 60 mg via INTRAVENOUS

## 2024-06-05 MED ORDER — ACETAMINOPHEN 500 MG PO TABS
ORAL_TABLET | ORAL | Status: AC
Start: 2024-06-05 — End: 2024-06-05
  Administered 2024-06-05: 1000 mg via ORAL
  Filled 2024-06-05: qty 2

## 2024-06-05 MED ORDER — CHLORHEXIDINE GLUCONATE 0.12 % MT SOLN
15.0000 mL | Freq: Once | OROMUCOSAL | Status: AC
Start: 2024-06-05 — End: 2024-06-05

## 2024-06-05 MED ORDER — PHENYLEPHRINE HCL-NACL 20-0.9 MG/250ML-% IV SOLN
INTRAVENOUS | Status: DC | PRN
  Administered 2024-06-05: 25 ug/min via INTRAVENOUS

## 2024-06-05 MED ORDER — TRAMADOL HCL 50 MG PO TABS
50.0000 mg | ORAL_TABLET | Freq: Four times a day (QID) | ORAL | 0 refills | Status: DC | PRN
  Filled 2024-06-05: qty 10, 3d supply, fill #0

## 2024-06-05 MED ORDER — LIDOCAINE 2% (20 MG/ML) 5 ML SYRINGE
INTRAMUSCULAR | Status: DC | PRN
Start: 2024-06-05 — End: 2024-06-05
  Administered 2024-06-05: 100 mg via INTRAVENOUS

## 2024-06-05 MED ORDER — LIDOCAINE 2% (20 MG/ML) 5 ML SYRINGE
INTRAMUSCULAR | Status: AC
  Filled 2024-06-05: qty 5

## 2024-06-05 MED ORDER — PHENYLEPHRINE 80 MCG/ML (10ML) SYRINGE FOR IV PUSH (FOR BLOOD PRESSURE SUPPORT)
PREFILLED_SYRINGE | INTRAVENOUS | Status: AC
  Filled 2024-06-05: qty 10

## 2024-06-05 MED ORDER — IPRATROPIUM-ALBUTEROL 0.5-2.5 (3) MG/3ML IN SOLN: 3.0000 mL | Freq: Once | RESPIRATORY_TRACT | Status: DC | PRN

## 2024-06-05 MED ORDER — FENTANYL CITRATE (PF) 100 MCG/2ML IJ SOLN: 25.0000 ug | INTRAMUSCULAR | Status: DC | PRN

## 2024-06-05 MED ORDER — OXYCODONE HCL 5 MG PO TABS: 5.0000 mg | ORAL_TABLET | Freq: Once | ORAL | Status: DC | PRN

## 2024-06-05 MED ORDER — ALBUMIN HUMAN 5 % IV SOLN: INTRAVENOUS | Status: DC | PRN

## 2024-06-05 MED ORDER — CHLORHEXIDINE GLUCONATE 0.12 % MT SOLN
OROMUCOSAL | Status: AC
Start: 2024-06-05 — End: 2024-06-05
  Administered 2024-06-05: 15 mL via OROMUCOSAL
  Filled 2024-06-05: qty 15

## 2024-06-05 MED ORDER — AMISULPRIDE (ANTIEMETIC) 5 MG/2ML IV SOLN: 10.0000 mg | Freq: Once | INTRAVENOUS | Status: DC | PRN

## 2024-06-05 MED ORDER — ACETAMINOPHEN 500 MG PO TABS: 1000.0000 mg | ORAL_TABLET | ORAL | Status: AC

## 2024-06-05 MED ORDER — BUPIVACAINE-EPINEPHRINE 0.25% -1:200000 IJ SOLN
INTRAMUSCULAR | Status: DC | PRN
  Administered 2024-06-05: 30 mL

## 2024-06-05 MED ORDER — ONDANSETRON HCL 4 MG/2ML IJ SOLN
INTRAMUSCULAR | Status: DC | PRN
  Administered 2024-06-05: 4 mg via INTRAVENOUS

## 2024-06-05 MED ORDER — PROPOFOL 10 MG/ML IV BOLUS
INTRAVENOUS | Status: AC
  Filled 2024-06-05: qty 20

## 2024-06-05 MED ORDER — ONDANSETRON HCL 4 MG/2ML IJ SOLN: 4.0000 mg | Freq: Once | INTRAMUSCULAR | Status: DC | PRN

## 2024-06-05 MED ORDER — CEFAZOLIN SODIUM-DEXTROSE 2-4 GM/100ML-% IV SOLN
2.0000 g | INTRAVENOUS | Status: AC
  Administered 2024-06-05: 2 g via INTRAVENOUS

## 2024-06-05 MED ORDER — OXYCODONE HCL 5 MG/5ML PO SOLN: 5.0000 mg | Freq: Once | ORAL | Status: DC | PRN

## 2024-06-05 MED ORDER — DEXAMETHASONE SOD PHOSPHATE PF 10 MG/ML IJ SOLN
INTRAMUSCULAR | Status: DC | PRN
Start: 2024-06-05 — End: 2024-06-05
  Administered 2024-06-05: 10 mg via INTRAVENOUS

## 2024-06-05 MED ORDER — GLYCOPYRROLATE 0.2 MG/ML IJ SOLN
INTRAMUSCULAR | Status: DC | PRN
  Administered 2024-06-05: .2 mg via INTRAVENOUS

## 2024-06-05 MED ORDER — LACTATED RINGERS IV SOLN: INTRAVENOUS | Status: DC

## 2024-06-05 MED ORDER — CEFAZOLIN SODIUM-DEXTROSE 2-4 GM/100ML-% IV SOLN
INTRAVENOUS | Status: AC
  Filled 2024-06-05: qty 100

## 2024-06-05 SURGICAL SUPPLY — 30 items
BAG COUNTER SPONGE SURGICOUNT (BAG) ×2 IMPLANT
BENZOIN TINCTURE PRP APPL 2/3 (GAUZE/BANDAGES/DRESSINGS) ×2 IMPLANT
BLADE CLIPPER SURG (BLADE) IMPLANT
CHLORAPREP W/TINT 26 (MISCELLANEOUS) ×2 IMPLANT
COVER SURGICAL LIGHT HANDLE (MISCELLANEOUS) ×2 IMPLANT
DRAPE LAPAROTOMY 100X72 PEDS (DRAPES) ×2 IMPLANT
DRSG TEGADERM 4X4.75 (GAUZE/BANDAGES/DRESSINGS) ×2 IMPLANT
ELECT CAUTERY BLADE 6.4 (BLADE) IMPLANT
ELECTRODE REM PT RTRN 9FT ADLT (ELECTROSURGICAL) ×2 IMPLANT
GAUZE SPONGE 4X4 12PLY STRL (GAUZE/BANDAGES/DRESSINGS) ×2 IMPLANT
GLOVE BIO SURGEON STRL SZ7 (GLOVE) ×2 IMPLANT
GLOVE BIOGEL PI IND STRL 7.5 (GLOVE) ×2 IMPLANT
GOWN STRL REUS W/ TWL LRG LVL3 (GOWN DISPOSABLE) ×4 IMPLANT
KIT BASIN OR (CUSTOM PROCEDURE TRAY) ×2 IMPLANT
KIT TURNOVER KIT B (KITS) ×2 IMPLANT
NDL HYPO 25GX1X1/2 BEV (NEEDLE) ×2 IMPLANT
NEEDLE HYPO 25GX1X1/2 BEV (NEEDLE) ×1 IMPLANT
PACK GENERAL/GYN (CUSTOM PROCEDURE TRAY) ×2 IMPLANT
PAD ARMBOARD POSITIONER FOAM (MISCELLANEOUS) ×2 IMPLANT
PENCIL SMOKE EVACUATOR (MISCELLANEOUS) ×2 IMPLANT
SOLN 0.9% NACL POUR BTL 1000ML (IV SOLUTION) ×2 IMPLANT
STRIP CLOSURE SKIN 1/2X4 (GAUZE/BANDAGES/DRESSINGS) ×2 IMPLANT
SUT ETHILON 2 0 FS 18 (SUTURE) IMPLANT
SUT MNCRL AB 4-0 PS2 18 (SUTURE) ×2 IMPLANT
SUT VIC AB 2-0 SH 27X BRD (SUTURE) IMPLANT
SUT VIC AB 3-0 SH 18 (SUTURE) IMPLANT
SUT VIC AB 3-0 SH 27XBRD (SUTURE) ×2 IMPLANT
SYR CONTROL 10ML LL (SYRINGE) ×2 IMPLANT
TOWEL GREEN STERILE (TOWEL DISPOSABLE) ×2 IMPLANT
TOWEL GREEN STERILE FF (TOWEL DISPOSABLE) ×2 IMPLANT

## 2024-06-05 NOTE — Transfer of Care (Signed)
 Immediate Anesthesia Transfer of Care Note  Patient: [name]  Procedure(s) Performed: EXCISION, LESION, BACK  Patient Location: PACU  Anesthesia Type:General  Level of Consciousness: drowsy  Airway & Oxygen Therapy: Patient Spontanous Breathing and Patient connected to face mask oxygen  Post-op Assessment: Report given to RN and Post -op Vital signs reviewed and stable  Post vital signs: Reviewed and stable  Last Vitals:  Vitals Value Taken Time  BP 135/52 06/05/24 12:06  Temp    Pulse 82 06/05/24 12:09  Resp 13 06/05/24 12:09  SpO2 98 % 06/05/24 12:09  Vitals shown include unfiled device data.  Last Pain:  Vitals:   06/05/24 0801  TempSrc:   PainSc: 0-No pain         Complications: No notable events documented.

## 2024-06-05 NOTE — Op Note (Signed)
 Pre-op diagnosis: Multiple sebaceous cysts of the back Postop diagnosis: Same Procedure performed: Excision of multiple sebaceous cysts of the back Surgeon:Chelcey Caputo K Stephaie Dardis Anesthesia: General Indications: This is an 83 year old male with a history of multiple sebaceous cyst excisions from his back.  Recently he developed a cyst that became infected and swollen.  This has been drained by dermatology.  He continues to have a small amount of drainage but he presents now for formal excision to allow the wound to heal completely.  He was noted to have other adjacent cysts so I recommended excision of all of these while he was under anesthesia to prevent recurrences.  Operative findings: No active purulence today.  The patient has a total of 5 sebaceous cyst.  2 of these measured 3 cm and the other 3 measured 2 cm each.  Description of procedure: The patient is brought to the operating room and placed in the supine position on the stretcher.  After an adequate level general anesthesia was obtained, he was moved to a prone position on the operating room table.  His upper back was prepped with Betadine and draped in sterile fashion.  A timeout was taken to ensure the proper patient and proper procedure.  I examined the opening where he had been draining purulent fluid when I saw him in the office.  This is no longer draining any purulence.  There is still an opening but there is minimal drainage.  The patient has a palpable 2 cm cyst on the lower right back.  We anesthetized with local anesthetic.  I made elliptical incision around the skin opening and we excised the entire cyst.  There is no sign of infection.  Cautery was used for hemostasis.  We then moved to the upper posterior right shoulder where there is another 2 cm cyst which was also excised in similar fashion.  There is no sign of infection.  Medially near the spine there is a 3 cm cyst with an adjacent seborrheic keratosis.  We excised these together  and I excised the cyst entirely.  This cyst extends all the way down to the underlying fascia.  We then addressed the original cyst which at this point measured only 2 cm.  Immediately above this cyst in the right side of his back, there is a larger 3 cm cyst.  We excised these together through a single 4 cm incision.  The larger cyst extends all the way down to the underlying fascia.  No gross purulence noted.  We irrigated all 4 wounds thoroughly and used cautery for hemostasis.  The 2 larger incisions were reapproximated in the subcutaneous space with multiple interrupted 3-0 Vicryl sutures.  All the skin incisions were closed with 2-0 Ethilon horizontal mattress sutures.  Dry dressings were applied.  The patient is then moved back to supine position.  He is extubated and brought to the recovery room in stable condition.  All sponge, instrument, and needle counts are correct.  [name]. [name], MD, [facility] Surgery  General Surgery   06/05/2024 12:03 PM

## 2024-06-05 NOTE — Interval H&P Note (Signed)
 History and Physical Interval Note:  06/05/2024 9:04 AM  [name]  has presented today for surgery, with the diagnosis of MULTIPLE INFECTED SEBACEOUS CYST.  The various methods of treatment have been discussed with the patient and family. After consideration of risks, benefits and other options for treatment, the patient has consented to  Procedure(s) with comments: EXCISION, LESION, BACK (N/A) - EXCISION OF MULTIPLE INFECTED SEBACEOUS CYST OF THE BACK as a surgical intervention.  The patient's history has been reviewed, patient examined, no change in status, stable for surgery.  I have reviewed the patient's chart and labs.  Questions were answered to the patient's satisfaction.     Donnice MARLA Lima

## 2024-06-05 NOTE — Anesthesia Procedure Notes (Signed)
 Procedure Name: Intubation Date/Time: 06/05/2024 10:24 AM  Performed by: Elby Raelene SAUNDERS, CRNAPre-anesthesia Checklist: Patient identified, Emergency Drugs available, Suction available and Patient being monitored Patient Re-evaluated:Patient Re-evaluated prior to induction Oxygen Delivery Method: Circle System Utilized Preoxygenation: Pre-oxygenation with 100% oxygen Induction Type: IV induction Ventilation: Oral airway inserted - appropriate to patient size and Two handed mask ventilation required Laryngoscope Size: Miller and 2 Grade View: Grade II Tube type: Oral Tube size: 7.5 mm Number of attempts: 1 Airway Equipment and Method: Stylet, Oral airway and Bite block Placement Confirmation: ETT inserted through vocal cords under direct vision, positive ETCO2 and breath sounds checked- equal and bilateral Secured at: 23 cm Tube secured with: Tape Dental Injury: Teeth and Oropharynx as per pre-operative assessment

## 2024-06-05 NOTE — Discharge Instructions (Signed)
 You may take the pain medication as needed.  If the pain is controlled with Tylenol  or ibuprofen you may stop taking the pain medication.  You have multiple sutures in 4 separate incisions in your back.  These should be covered with dry gauze and tape.  You may have a little bit of drainage initially but the drainage should stop.  The drainage should not appear to be pus but may be some clear pink drainage.  After 24 hours, you may begin showering.  Do not scrub these incisions on your back but you may let soapy water run over these areas.  Be careful as you dry your back to not pull the stitches.  You will need to find somebody to help you apply dry dressings over these areas to prevent your close from snagging on the stitches.  I will see you back in the office in 2 weeks to remove the stitches.

## 2024-06-08 ENCOUNTER — Encounter (HOSPITAL_COMMUNITY): Payer: Self-pay | Admitting: Surgery

## 2024-06-08 LAB — SURGICAL PATHOLOGY

## 2024-06-09 NOTE — Anesthesia Postprocedure Evaluation (Signed)
 Anesthesia Post Note  Patient: [name]  Procedure(s) Performed: EXCISION, LESION, BACK     Patient location during evaluation: PACU Anesthesia Type: General Level of consciousness: awake Pain management: pain level controlled Vital Signs Assessment: post-procedure vital signs reviewed and stable Respiratory status: spontaneous breathing Cardiovascular status: blood pressure returned to baseline Postop Assessment: no apparent nausea or vomiting Anesthetic complications: no   No notable events documented.  Last Vitals:  Vitals:   06/05/24 1300 06/05/24 1315  BP: (!) 141/62 135/69  Pulse: 62 62  Resp: 10 11  Temp:  36.8 C  SpO2: 100% 95%    Last Pain:  Vitals:   06/05/24 1205  TempSrc:   PainSc: Asleep                 Lauraine KATHEE [place]

## 2024-06-10 ENCOUNTER — Other Ambulatory Visit: Payer: Self-pay | Admitting: Nurse Practitioner

## 2024-06-10 DIAGNOSIS — I10 Essential (primary) hypertension: Secondary | ICD-10-CM

## 2024-06-19 DIAGNOSIS — L089 Local infection of the skin and subcutaneous tissue, unspecified: Secondary | ICD-10-CM | POA: Diagnosis not present

## 2024-06-19 DIAGNOSIS — L723 Sebaceous cyst: Secondary | ICD-10-CM | POA: Diagnosis not present

## 2024-06-26 ENCOUNTER — Telehealth: Payer: Self-pay

## 2024-06-26 DIAGNOSIS — L089 Local infection of the skin and subcutaneous tissue, unspecified: Secondary | ICD-10-CM | POA: Diagnosis not present

## 2024-06-26 DIAGNOSIS — L723 Sebaceous cyst: Secondary | ICD-10-CM | POA: Diagnosis not present

## 2024-06-26 NOTE — Telephone Encounter (Signed)
 Patient's last OV 03/06/2023.  Patient needs OV with Dr. Neda.  Patient was informed on last refill for Trelegy 100 mcg on 04/10/2024 needs OV.  Checked sample closet.  NO samples for Trelegy available at this time.  Called patient.  Informed patient we do not have samples.  Patient made OV with Dr. Neda for 09/30/2024.        Copied from CRM #[id number]. Topic: Clinical - Prescription Issue >> Jun 23, 2024 10:04 AM Rilla NOVAK wrote: Reason for CRM: Patient calling in for Trelegy samples. States he has a little left but he's in a doughnut hole. (?)  States the office has provided him with samples and he would like to know if he can get samples to get him to the end of the year.  Please call patient @  [phone]. >> Jun 25, 2024  3:12 PM Benton O wrote: Patient is calling back concerning getting some samples for trelegy . Patient has not heard from anyone . Patient called in on the 9th and still nothing . Called cal and no response . Please give patient a call concerning getting samples for trelegy. Called the cal line three times still no response . Please reach out to patient [phone]

## 2024-07-07 ENCOUNTER — Ambulatory Visit: Admitting: Family

## 2024-07-07 ENCOUNTER — Encounter: Payer: Self-pay | Admitting: Family

## 2024-07-07 VITALS — BP 132/62 | HR 77 | Temp 97.9°F | Ht 67.0 in | Wt 237.0 lb

## 2024-07-07 DIAGNOSIS — J069 Acute upper respiratory infection, unspecified: Secondary | ICD-10-CM | POA: Diagnosis not present

## 2024-07-07 LAB — POC COVID19 BINAXNOW: SARS Coronavirus 2 Ag: NEGATIVE

## 2024-07-07 LAB — POCT INFLUENZA A/B
Influenza A, POC: NEGATIVE
Influenza B, POC: NEGATIVE

## 2024-07-07 MED ORDER — GUAIFENESIN ER 600 MG PO TB12: 600.0000 mg | ORAL_TABLET | Freq: Two times a day (BID) | ORAL | 0 refills | Status: AC

## 2024-07-07 MED ORDER — LORATADINE 10 MG PO TABS: 10.0000 mg | ORAL_TABLET | Freq: Every day | ORAL | 0 refills | Status: AC

## 2024-07-07 MED ORDER — DOXYCYCLINE HYCLATE 100 MG PO TABS: 100.0000 mg | ORAL_TABLET | Freq: Two times a day (BID) | ORAL | 0 refills | Status: AC

## 2024-07-07 MED ORDER — ALBUTEROL SULFATE HFA 108 (90 BASE) MCG/ACT IN AERS: 2.0000 | INHALATION_SPRAY | Freq: Four times a day (QID) | RESPIRATORY_TRACT | 0 refills | Status: AC | PRN

## 2024-07-07 NOTE — Progress Notes (Signed)
 "  Provider: Nello Corro FNP-C  Caro Harlene POUR, NP  Patient Care Team: Caro Harlene POUR, NP as PCP - General (Nurse Practitioner) Ivin Kocher, MD as Consulting Physician (Dermatology) Teressa Toribio SQUIBB, MD (Inactive) as Attending Physician (Gastroenterology)  Extended Emergency Contact Information Primary Emergency Contact: Scronce,Jeff  [place] Home Phone: [phone] Mobile Phone: [phone] Relation: Friend Secondary Emergency Contact: Vincent,Chester  [place] Home Phone: [phone] Relation: Friend  Code Status:  DNR Goals of care: Advanced Directive information    05/25/2024   12:54 PM  Advanced Directives  Does Patient Have a Medical Advance Directive? No     Chief Complaint  Patient presents with   sick visit    Patient presents today for cough, blowing nose constantly, runny nose, and congestion.    HPI:  Pt is a 83 y.o. male seen today for an acute visit for evaluation of cough, runny nose and congestion x 1 weeks.Has trouble with breathing.He denies any fever or chills.No weight gain or worsening leg edema. Has not been contact with sick person. Has not tried any OTC cough medication.  Appetite not too bad for the couple of days.  He denies any diarrhea, nausea or vomiting.  Past Medical History:  Diagnosis Date   A-fib [facility])    Acute appendicitis with peritoneal abscess 01/28/2013   Allergy    seasonal    Anxiety    Arthritis    Bacteremia September 2015   Elevated PSA    Essential hypertension 04/16/2014   Heart murmur    childhood   Ileus, postoperative (HCC) 01/28/2013   Lung cancer (HCC) 2016   Obesity, morbid (HCC) 01/28/2013   Shortness of breath    Tobacco use disorder 01/28/2013   Past Surgical History:  Procedure Laterality Date   APPENDECTOMY     BACK SURGERY  1997, 2003   Dr.Apleton    CARPAL TUNNEL RELEASE Bilateral    COLONOSCOPY     x 3   EXCISION OF BACK LESION N/A 06/05/2024    Procedure: EXCISION, LESION, BACK;  Surgeon: [name] Cough, MD;  Location: [facility] OR;  Service: General;  Laterality: N/A;  EXCISION OF MULTIPLE INFECTED SEBACEOUS CYST OF THE BACK   EYE SURGERY Left    muscle of the eye    KNEE SURGERY Bilateral    Arthroscopy   LAPAROSCOPIC APPENDECTOMY N/A 01/25/2013   Procedure: APPENDECTOMY LAPAROSCOPIC;  Surgeon: Lynda Leos, MD;  Location: [facility] OR;  Service: General;  Laterality: N/A;   LYMPH NODE DISSECTION Left 04/26/2015   Procedure: LYMPH NODE DISSECTION;  Surgeon: Dallas KATHEE Jude, MD;  Location: [facility] OR;  Service: Thoracic;  Laterality: Left;   POLYPECTOMY     SHOULDER SURGERY Right 2014   Dr.Whitfield rotator cuff surgery   SKIN SURGERY Right 01/2019   Right Leg, cancerous area removed   SKIN SURGERY  01/01/2022   Right hand (wrist and between thumb and forefinger). [facility]   TEE WITHOUT CARDIOVERSION N/A 04/07/2014   Procedure: TRANSESOPHAGEAL ECHOCARDIOGRAM (TEE);  Surgeon: Oneil Parchment, MD;  Location: [facility] ENDOSCOPY;  Service: Cardiovascular;  Laterality: N/A;   VIDEO ASSISTED THORACOSCOPY (VATS)/WEDGE RESECTION Left 04/26/2015   Procedure: LEFT VIDEO ASSISTED THORACOSCOPY (VATS) WITH LEFT LOWER LOBECTOMY;  Surgeon: Dallas KATHEE Jude, MD;  Location: [facility] OR;  Service: Thoracic;  Laterality: Left;   VIDEO BRONCHOSCOPY N/A 04/26/2015   Procedure: VIDEO BRONCHOSCOPY;  Surgeon: Dallas KATHEE Jude, MD;  Location[facility] OR;  Service: Thoracic;  Laterality: N/A;  Allergies[1]  Outpatient Encounter Medications as of 07/07/2024  Medication Sig   atorvastatin  (LIPITOR) 10 MG tablet Take 1 tablet by mouth once daily   hydrALAZINE  (APRESOLINE ) 25 MG tablet TAKE 1 TABLET BY MOUTH THREE TIMES DAILY   losartan  (COZAAR ) 50 MG tablet Take 1 tablet by mouth once daily   metoprolol  tartrate (LOPRESSOR ) 25 MG tablet Take 1/2 (one-half) tablet by mouth twice daily   TRELEGY ELLIPTA  100-62.5-25 MCG/ACT AEPB INHALE 1 PUFF BY MOUTH ONCE DAILY .  APPOINTMENT REQUIRED FOR FUTURE REFILLS   albuterol  (VENTOLIN  HFA) 108 (90 Base) MCG/ACT inhaler Inhale 2 puffs into the lungs every 6 (six) hours as needed for wheezing or shortness of breath. (Patient not taking: Reported on 06/01/2024)   doxycycline  (VIBRA -TABS) 100 MG tablet Take 100 mg by mouth 2 (two) times daily. (Patient not taking: Reported on 07/07/2024)   tamsulosin  (FLOMAX ) 0.4 MG CAPS capsule Take 1 capsule (0.4 mg total) by mouth at bedtime. (Patient not taking: Reported on 06/01/2024)   traMADol  (ULTRAM ) 50 MG tablet Take 1 tablet (50 mg total) by mouth every 6 (six) hours as needed for moderate pain (pain score 4-6) or severe pain (pain score 7-10). (Patient not taking: Reported on 07/07/2024)   No facility-administered encounter medications on file as of 07/07/2024.    Review of Systems  Constitutional:  Negative for appetite change, chills, fatigue, fever and unexpected weight change.  HENT:  Positive for rhinorrhea. Negative for congestion, dental problem, ear discharge, ear pain, facial swelling, hearing loss, nosebleeds, postnasal drip, sinus pressure, sinus pain, sneezing, sore throat, tinnitus and trouble swallowing.   Eyes:  Negative for pain, discharge, redness, itching and visual disturbance.  Respiratory:  Positive for cough and wheezing. Negative for chest tightness and shortness of breath.        Congestion   Cardiovascular:  Negative for chest pain, palpitations and leg swelling.  Gastrointestinal:  Negative for abdominal distention, abdominal pain, blood in stool, constipation, diarrhea, nausea and vomiting.  Musculoskeletal:  Negative for arthralgias, back pain, gait problem, joint swelling, myalgias, neck pain and neck stiffness.  Skin:  Negative for color change, pallor, rash and wound.  Neurological:  Negative for dizziness, syncope, speech difficulty, weakness, light-headedness, numbness and headaches.  Hematological:  Does not bruise/bleed easily.   Psychiatric/Behavioral:  Negative for agitation, behavioral problems, confusion, hallucinations, self-injury, sleep disturbance and suicidal ideas. The patient is not nervous/anxious.     Immunization History  Administered Date(s) Administered   Fluad Quad(high Dose 65+) 04/03/2021, 04/04/2022   INFLUENZA, HIGH DOSE SEASONAL PF 03/25/2017, 03/25/2018, 04/01/2023, 04/10/2024   Influenza,inj,quad, With Preservative 03/31/2016   Influenza-Unspecified 04/29/2014, 03/02/2019, 03/08/2020   PFIZER(Purple Top)SARS-COV-2 Vaccination 08/07/2019, 08/28/2019, 04/21/2020   PNEUMOCOCCAL CONJUGATE-20 01/02/2023   Pneumococcal Conjugate-13 02/14/2016, 01/02/2023   Pneumococcal-Unspecified 04/29/2014   RSV,unspecified 03/29/2023   Td 01/13/2021   Tdap 01/13/2021   Zoster Recombinant(Shingrix) 12/18/2017, 03/01/2018   Pertinent  Health Maintenance Due  Topic Date Due   Colonoscopy  10/17/2026   Influenza Vaccine  Completed      10/07/2023    8:40 AM 01/16/2024    5:47 PM 01/20/2024   10:03 AM 04/10/2024    8:33 AM 07/07/2024   11:15 AM  Fall Risk  Falls in the past year? 0 0  0 1 0  Was there an injury with Fall? 0   0  0  0  Fall Risk Category Calculator 0  0 1 0  Patient at Risk for Falls Due to Impaired mobility  No Fall Risks Impaired balance/gait No Fall Risks  Fall risk Follow up Falls evaluation completed  Falls evaluation completed Falls evaluation completed Falls evaluation completed     Manually entered by patient   Data saved with a previous flowsheet row definition   Functional Status Survey:    Vitals:   07/07/24 1118  BP: 132/62  Pulse: 77  Temp: 97.9 F (36.6 C)  SpO2: 98%  Weight: 237 lb (107.5 kg)  Height: 5' 7 (1.702 m)   Body mass index is 37.12 kg/m. Physical Exam Vitals reviewed.  Constitutional:      General: He is not in acute distress.    Appearance: Normal appearance. He is normal weight. He is not ill-appearing or diaphoretic.  HENT:     Head:  Normocephalic.     Right Ear: Tympanic membrane, ear canal and external ear normal. There is no impacted cerumen.     Left Ear: Tympanic membrane, ear canal and external ear normal. There is no impacted cerumen.     Nose: Rhinorrhea present. No congestion.     Mouth/Throat:     Mouth: Mucous membranes are moist.     Pharynx: Oropharynx is clear. No oropharyngeal exudate or posterior oropharyngeal erythema.  Eyes:     General: No scleral icterus.       Right eye: No discharge.        Left eye: No discharge.     Extraocular Movements: Extraocular movements intact.     Conjunctiva/sclera: Conjunctivae normal.     Pupils: Pupils are equal, round, and reactive to light.  Neck:     Vascular: No carotid bruit.  Cardiovascular:     Rate and Rhythm: Normal rate and regular rhythm.     Pulses: Normal pulses.     Heart sounds: Normal heart sounds. No murmur heard.    No friction rub. No gallop.  Pulmonary:     Effort: Pulmonary effort is normal. No respiratory distress.     Breath sounds: Wheezing present. No rhonchi or rales.  Chest:     Chest wall: No tenderness.  Abdominal:     General: Bowel sounds are normal. There is no distension.     Palpations: Abdomen is soft. There is no mass.     Tenderness: There is no abdominal tenderness. There is no right CVA tenderness, left CVA tenderness, guarding or rebound.  Musculoskeletal:        General: No swelling or tenderness. Normal range of motion.     Cervical back: Normal range of motion. No rigidity or tenderness.     Right lower leg: No edema.     Left lower leg: No edema.  Lymphadenopathy:     Cervical: No cervical adenopathy.  Skin:    General: Skin is warm and dry.     Coloration: Skin is not pale.     Findings: No bruising, erythema, lesion or rash.  Neurological:     Mental Status: He is alert and oriented to person, place, and time.     Motor: No weakness.     Gait: Gait normal.  Psychiatric:        Mood and Affect: Mood  normal.        Speech: Speech normal.        Behavior: Behavior normal.     Labs reviewed: Recent Labs    10/07/23 1020 04/10/24 0901 05/25/24 1306  NA 139 139 141  K 4.5 4.5 4.4  CL 108 108 107  CO2 23  25 24  GLUCOSE 82 90 92  BUN 25 23 24*  CREATININE 0.96 0.97 1.00  CALCIUM  9.1 9.1 9.2   Recent Labs    10/07/23 1020 04/10/24 0901  AST 15 19  ALT 13 15  BILITOT 0.4 0.6  PROT 6.9 6.9   Recent Labs    10/07/23 1020 04/10/24 0901 05/25/24 1306  WBC 8.0 7.9 7.5  NEUTROABS 5,784 5,625 6.0  HGB 16.3 16.5 16.0  HCT 49.4 51.1* 49.7  MCV 96.1 100.8* 99.4  PLT 242 219 266   Lab Results  Component Value Date   TSH 1.01 02/08/2016   Lab Results  Component Value Date   HGBA1C 5.8 (H) 04/10/2024   Lab Results  Component Value Date   CHOL 126 04/10/2024   HDL 40 04/10/2024   LDLCALC 65 04/10/2024   TRIG 126 04/10/2024   CHOLHDL 3.2 04/10/2024    Significant Diagnostic Results in last 30 days:  No results found.  Assessment/Plan  Upper respiratory infection with cough and congestion (Primary) Afebrile diminished lung bases noted - POC Influenza A/B results negative - POC COVID-19 negative Refilled albuterol  for wheezing,cough shortness of breath.  Will cover with antibiotics - Advised to notify provider or go to the ED if symptoms worsen - albuterol  (VENTOLIN  HFA) 108 (90 Base) MCG/ACT inhaler; Inhale 2 puffs into the lungs every 6 (six) hours as needed for wheezing or shortness of breath.  Dispense: 8 g; Refill: 0 - doxycycline  (VIBRA -TABS) 100 MG tablet; Take 1 tablet (100 mg total) by mouth 2 (two) times daily for 7 days.  Dispense: 14 tablet; Refill: 0 - guaiFENesin  (MUCINEX ) 600 MG 12 hr tablet; Take 1 tablet (600 mg total) by mouth 2 (two) times daily for 10 days.  Dispense: 20 tablet; Refill: 0 - loratadine  (CLARITIN ) 10 MG tablet; Take 1 tablet (10 mg total) by mouth daily.  Dispense: 10 tablet; Refill: 0  Family/ staff Communication: Reviewed plan  of care with patient verbalized understanding  Labs/tests ordered:  - POC Influenza A/B  - POC COVID-19   Next Appointment: Return if symptoms worsen or fail to improve.  Total time: 20 minutes. Greater than 50% of total time spent doing patient education regarding cough and congestion,health maintenance including symptom/medication management.   Gryffin Altice C Birdell Frasier, NP     [1]  Allergies Allergen Reactions   Dorethia Joy ] Anaphylaxis and Swelling    Angioedema/Eyes and lip swelling   Naproxen Hives   Nsaids Hives   Tolmetin Hives   "

## 2024-07-20 ENCOUNTER — Other Ambulatory Visit: Payer: Self-pay | Admitting: Pulmonary Disease

## 2024-07-20 NOTE — Telephone Encounter (Signed)
 Copied from CRM [id number]. Topic: Clinical - Medication Refill >> Jul 20, 2024 11:03 AM Devaughn RAMAN wrote: Medication:  TRELEGY ELLIPTA  [id number] MCG/ACT AEPB    Has the patient contacted their pharmacy? Yes (Agent: If no, request that the patient contact the pharmacy for the refill. If patient does not wish to contact the pharmacy document the reason why and proceed with request.) (Agent: If yes, when and what did the pharmacy advise?)  This is the patient's preferred pharmacy:  [id number] - [place] - [address] [place] ([place]) [place] [address] Phone: [phone] Fax: [phone]  Is this the correct pharmacy for this prescription? Yes If no, delete pharmacy and type the correct one.   Has the prescription been filled recently? No  Is the patient out of the medication? No, 5-6 days left  Has the patient been seen for an appointment in the last year OR does the patient have an upcoming appointment? Yes  Can we respond through MyChart? No  Agent: Please be advised that Rx refills may take up to 3 business days. We ask that you follow-up with your pharmacy.

## 2024-07-20 NOTE — Telephone Encounter (Signed)
 Pt must keep upcoming appt in 09/2024 for further refills

## 2024-08-14 ENCOUNTER — Other Ambulatory Visit: Payer: Self-pay | Admitting: Pulmonary Disease

## 2024-09-30 ENCOUNTER — Ambulatory Visit: Admitting: Pulmonary Disease

## 2024-10-12 ENCOUNTER — Ambulatory Visit: Payer: Self-pay | Admitting: Nurse Practitioner

## 2025-01-22 ENCOUNTER — Ambulatory Visit: Payer: Self-pay | Admitting: Nurse Practitioner

## 2025-01-29 ENCOUNTER — Ambulatory Visit: Admitting: Nurse Practitioner
